# Patient Record
Sex: Female | Born: 1970 | Race: White | Hispanic: No | Marital: Married | Smoking: Former smoker
Health system: Southern US, Community
[De-identification: ages and names within clinical notes are randomized; demographics above are authoritative.]

## PROBLEM LIST (undated history)

## (undated) DIAGNOSIS — E119 Type 2 diabetes mellitus without complications: Secondary | ICD-10-CM

## (undated) DIAGNOSIS — I639 Cerebral infarction, unspecified: Secondary | ICD-10-CM

## (undated) DIAGNOSIS — R55 Syncope and collapse: Secondary | ICD-10-CM

## (undated) DIAGNOSIS — S93401A Sprain of unspecified ligament of right ankle, initial encounter: Secondary | ICD-10-CM

## (undated) DIAGNOSIS — E785 Hyperlipidemia, unspecified: Secondary | ICD-10-CM

## (undated) DIAGNOSIS — G4710 Hypersomnia, unspecified: Secondary | ICD-10-CM

## (undated) HISTORY — DX: Sprain of unspecified ligament of right ankle, initial encounter: S93.401A

## (undated) HISTORY — DX: Hypersomnia, unspecified: G47.10

## (undated) HISTORY — DX: Syncope and collapse: R55

## (undated) NOTE — *Deleted (*Deleted)
   Objective Swallowing Evaluation: MBS-Modified Barium Swallow Study  Patient Details  Name: Deborah [name] MRN: [id number] Date of Birth: 1971-01-08  Today's Date: 04/01/2020 Time:  -     Past Medical History:  Past Medical History:  Diagnosis Date  . DM (diabetes mellitus), type 2 (HCC)   . Hyperlipidemia   . Hypersomnia   . Sprain of right ankle, initial encounter   . Stroke (HCC)   . Syncope    Past Surgical History:  Past Surgical History:  Procedure Laterality Date  . IR CT HEAD LTD  03/08/2020  . IR PERCUTANEOUS ART THROMBECTOMY/INFUSION INTRACRANIAL INC DIAG ANGIO  03/08/2020  . RADIOLOGY WITH ANESTHESIA N/A 03/08/2020   Procedure: IR WITH ANESTHESIA;  Surgeon: Julieanne Cotton, MD;  Location: [facility] OR;  Service: Radiology;  Laterality: N/A;   HPI:  49 y/o female with history of IDDM, HLD, tobacco use disorder presented to [facility] for facial droop, right sided weakness and global aphasia. Found to have LVO of left M1 segment and was transferred to [facility]. Pt underwent left common carotid arteriogram followed by revascularization of L MCA, however it reoccluded due to sever distal M1 stenosis.Pt underwent trach on 9/26.     Recommendation/Prognosis  Clinical Impression:   SLP Visit Diagnosis: Dysphagia, oropharyngeal phase (R13.12) Impact on safety and function: Mild aspiration risk   Swallow Evaluation Recommendations:       Prognosis:  Prognosis for Safe Diet Advancement: Good   Individuals Consulted: Consulted and Agree with Results and Recommendations: Family member/caregiver Family Member Consulted: patient's boyfriend/fiance Report Sent to : Referring physician      SLP Assessment/Plan  Plan:      Short Term Goals: {[id number]}    General: Date of Onset: 03/31/20 Type of Study: MBS-Modified Barium Swallow Study Previous Swallow Assessment: BSE Diet Prior to this Study: NPO Temperature Spikes Noted: No Trach Size and Type:  #4;Uncuffed;With PMSV in place History of Recent Intubation: Yes Length of Intubations (days): 5 days Date extubated:  (trach 9/26) Oral Cavity - Dentition: Edentulous Self-Feeding Abilities: Total assist Baseline Vocal Quality: Normal Volitional Cough: Cognitively unable to elicit Volitional Swallow: Unable to elicit Anatomy: Within functional limits Pharyngeal Secretions: Not observed secondary MBS   Reason for Referral:        Oral Phase:     Pharyngeal Phase:      Cervical Esophageal Phase      GN          Pablo Lawrence 04/01/2020, 5:56 PM

## (undated) NOTE — *Deleted (*Deleted)
Nutrition Follow-up  DOCUMENTATION CODES:   Obesity unspecified  INTERVENTION:  ***   NUTRITION DIAGNOSIS:   Inadequate oral intake related to inability to eat as evidenced by NPO status.  ***  GOAL:   Provide needs based on ASPEN/SCCM guidelines  ***  MONITOR:   TF tolerance, Labs  REASON FOR ASSESSMENT:   Consult, Ventilator Enteral/tube feeding initiation and management  ASSESSMENT:   Pt with PMH of IDDM, HLD, tobacco abuse admitted to OOH with stroke symptoms. Pt admitted to [facility] with L MCA stroke s/p unsuccessful revascularization.  ***  Labs: Meds:    Diet Order:   Diet Order            Diet NPO time specified  Diet effective midnight                 EDUCATION NEEDS:   No education needs have been identified at this time  Skin:  Skin Assessment: Reviewed RN Assessment  Last BM:  unknown  Height:   Ht Readings from Last 1 Encounters:  03/08/20 5' (1.524 m)    Weight:   Wt Readings from Last 1 Encounters:  03/17/20 92.1 kg    Ideal Body Weight:  45.4 kg  BMI:  Body mass index is 39.65 kg/m.  Estimated Nutritional Needs:   Kcal:  1200  Protein:  >90 grams  Fluid:  > 1.5 L/day    Romelle Starcher MS, RDN, LDN, CNSC Registered Dietitian III Clinical Nutrition RD Pager and On-Call Pager Number Located in [place]

---

## 2005-06-18 DIAGNOSIS — R112 Nausea with vomiting, unspecified: Secondary | ICD-10-CM | POA: Insufficient documentation

## 2005-06-18 DIAGNOSIS — Z9889 Other specified postprocedural states: Secondary | ICD-10-CM | POA: Insufficient documentation

## 2016-07-19 DIAGNOSIS — N261 Atrophy of kidney (terminal): Secondary | ICD-10-CM | POA: Insufficient documentation

## 2016-07-19 DIAGNOSIS — R109 Unspecified abdominal pain: Secondary | ICD-10-CM | POA: Insufficient documentation

## 2016-09-03 DIAGNOSIS — N119 Chronic tubulo-interstitial nephritis, unspecified: Secondary | ICD-10-CM | POA: Insufficient documentation

## 2019-10-28 ENCOUNTER — Other Ambulatory Visit: Payer: Self-pay

## 2019-10-28 DIAGNOSIS — R0602 Shortness of breath: Secondary | ICD-10-CM | POA: Insufficient documentation

## 2019-10-28 DIAGNOSIS — F419 Anxiety disorder, unspecified: Secondary | ICD-10-CM | POA: Insufficient documentation

## 2019-10-28 DIAGNOSIS — J309 Allergic rhinitis, unspecified: Secondary | ICD-10-CM | POA: Insufficient documentation

## 2019-10-28 DIAGNOSIS — E119 Type 2 diabetes mellitus without complications: Secondary | ICD-10-CM | POA: Insufficient documentation

## 2019-10-28 DIAGNOSIS — G43909 Migraine, unspecified, not intractable, without status migrainosus: Secondary | ICD-10-CM | POA: Insufficient documentation

## 2019-10-29 ENCOUNTER — Encounter: Payer: Self-pay | Admitting: Cardiology

## 2019-10-29 ENCOUNTER — Other Ambulatory Visit: Payer: Self-pay

## 2019-10-29 ENCOUNTER — Ambulatory Visit (INDEPENDENT_AMBULATORY_CARE_PROVIDER_SITE_OTHER): Payer: Medicaid Other | Admitting: Cardiology

## 2019-10-29 VITALS — BP 126/70 | HR 95 | Temp 98.4°F | Ht 60.0 in | Wt 200.0 lb

## 2019-10-29 DIAGNOSIS — R0602 Shortness of breath: Secondary | ICD-10-CM | POA: Diagnosis not present

## 2019-10-29 DIAGNOSIS — R0789 Other chest pain: Secondary | ICD-10-CM | POA: Insufficient documentation

## 2019-10-29 DIAGNOSIS — R55 Syncope and collapse: Secondary | ICD-10-CM | POA: Diagnosis not present

## 2019-10-29 DIAGNOSIS — F17200 Nicotine dependence, unspecified, uncomplicated: Secondary | ICD-10-CM

## 2019-10-29 MED ORDER — CLOPIDOGREL BISULFATE 75 MG PO TABS
75.0000 mg | ORAL_TABLET | Freq: Every day | ORAL | 2 refills | Status: DC
Start: 2019-10-29 — End: 2020-03-30

## 2019-10-29 NOTE — Patient Instructions (Addendum)
Medication Instructions:  Your physician has recommended you make the following change in your medication:  START: Plavix 75 mg daily   *If you need a refill on your cardiac medications before your next appointment, please call your pharmacy*   Lab Work: None.  If you have labs (blood work) drawn today and your tests are completely normal, you will receive your results only by: Marland Kitchen MyChart Message (if you have MyChart) OR . A paper copy in the mail If you have any lab test that is abnormal or we need to change your treatment, we will call you to review the results.   Testing/Procedures: Your physician has requested that you have an echocardiogram. Echocardiography is a painless test that uses sound waves to create images of your heart. It provides your doctor with information about the size and shape of your heart and how well your heart's chambers and valves are working. This procedure takes approximately one hour. There are no restrictions for this procedure.    [facility] [address] [place], [place] [address] Phone:  [phone]    Please arrive 15 minutes prior to your appointment time for registration and insurance purposes.  The test will take approximately 3 to 4 hours to complete; you may bring reading material.  If someone comes with you to your appointment, they will need to remain in the main lobby due to limited space in the testing area. **If you are pregnant or breastfeeding, please notify the nuclear lab prior to your appointment**  How to prepare for your Myocardial Perfusion Test: . Do not eat or drink 3 hours prior to your test, except you may have water. . Do not consume products containing caffeine (regular or decaffeinated) 12 hours prior to your test. (ex: coffee, chocolate, sodas, tea). . Do bring a list of your current medications with you.  If not listed below, you may take your medications as normal. .  HOLD diabetic  medication/insulin the morning of the test: glimepiride, and victoza, Take half of long acting insulin the night before test: lantus 10.5 units . Do wear comfortable clothes (no dresses or overalls) and walking shoes, tennis shoes preferred (No heels or open toe shoes are allowed). . Do NOT wear cologne, perfume, aftershave, or lotions (deodorant is allowed). . If these instructions are not followed, your test will have to be rescheduled.  Please report to [address] for your test.  If you have questions or concerns about your appointment, you can call the [facility] at [phone].  If you cannot keep your appointment, please provide 24 hours notification to the Nuclear Lab, to avoid a possible $50 charge to your account.   A zio monitor was ordered today. It will remain on for 14 days. You will then return monitor and event diary in provided box. It takes 1-2 weeks for report to be downloaded and returned to [place]. We will call you with the results. If monitor falls off or has orange flashing light, please call Zio for further instructions.    Follow-Up: At [facility], you and your health needs are our priority.  As part of our continuing mission to provide you with exceptional heart care, we have created designated Provider Care Teams.  These Care Teams include your primary Cardiologist (physician) and Advanced Practice Providers (APPs -  Physician Assistants and Nurse Practitioners) who all work together to provide you with the care you need, when you need  it.  We recommend signing up for the patient portal called "MyChart".  Sign up information is provided on this After Visit Summary.  MyChart is used to connect with patients for Virtual Visits (Telemedicine).  Patients are able to view lab/test results, encounter notes, upcoming appointments, etc.  Non-urgent messages can be sent to your provider as well.   To learn more about what you can do with  MyChart, go to [email or web address].    Your next appointment:   6 week(s)  The format for your next appointment:   In Person  Provider:   Gypsy Balsam, MD   Other Instructions   Echocardiogram An echocardiogram is a procedure that uses painless sound waves (ultrasound) to produce an image of the heart. Images from an echocardiogram can provide important information about:  Signs of coronary artery disease (CAD).  Aneurysm detection. An aneurysm is a weak or damaged part of an artery wall that bulges out from the normal force of blood pumping through the body.  Heart size and shape. Changes in the size or shape of the heart can be associated with certain conditions, including heart failure, aneurysm, and CAD.  Heart muscle function.  Heart valve function.  Signs of a past heart attack.  Fluid buildup around the heart.  Thickening of the heart muscle.  A tumor or infectious growth around the heart valves. Tell a health care provider about:  Any allergies you have.  All medicines you are taking, including vitamins, herbs, eye drops, creams, and over-the-counter medicines.  Any blood disorders you have.  Any surgeries you have had.  Any medical conditions you have.  Whether you are pregnant or may be pregnant. What are the risks? Generally, this is a safe procedure. However, problems may occur, including:  Allergic reaction to dye (contrast) that may be used during the procedure. What happens before the procedure? No specific preparation is needed. You may eat and drink normally. What happens during the procedure?   An IV tube may be inserted into one of your veins.  You may receive contrast through this tube. A contrast is an injection that improves the quality of the pictures from your heart.  A gel will be applied to your chest.  A wand-like tool (transducer) will be moved over your chest. The gel will help to transmit the sound waves from the  transducer.  The sound waves will harmlessly bounce off of your heart to allow the heart images to be captured in real-time motion. The images will be recorded on a computer. The procedure may vary among health care providers and hospitals. What happens after the procedure?  You may return to your normal, everyday life, including diet, activities, and medicines, unless your health care provider tells you not to do that. Summary  An echocardiogram is a procedure that uses painless sound waves (ultrasound) to produce an image of the heart.  Images from an echocardiogram can provide important information about the size and shape of your heart, heart muscle function, heart valve function, and fluid buildup around your heart.  You do not need to do anything to prepare before this procedure. You may eat and drink normally.  After the echocardiogram is completed, you may return to your normal, everyday life, unless your health care provider tells you not to do that. This information is not intended to replace advice given to you by your health care provider. Make sure you discuss any questions you have with your health care provider. Document Revised:  09/25/2018 Document Reviewed: 07/07/2016 Elsevier Patient Education  2020 [place].   Cardiac Nuclear Scan A cardiac nuclear scan is a test that measures blood flow to the heart when a person is resting and when he or she is exercising. The test looks for problems such as:  Not enough blood reaching a portion of the heart.  The heart muscle not working normally. You may need this test if:  You have heart disease.  You have had abnormal lab results.  You have had heart surgery or a balloon procedure to open up blocked arteries (angioplasty).  You have chest pain.  You have shortness of breath. In this test, a radioactive dye (tracer) is injected into your bloodstream. After the tracer has traveled to your heart, an imaging device is  used to measure how much of the tracer is absorbed by or distributed to various areas of your heart. This procedure is usually done at a hospital and takes 2-4 hours. Tell a health care provider about:  Any allergies you have.  All medicines you are taking, including vitamins, herbs, eye drops, creams, and over-the-counter medicines.  Any problems you or family members have had with anesthetic medicines.  Any blood disorders you have.  Any surgeries you have had.  Any medical conditions you have.  Whether you are pregnant or may be pregnant. What are the risks? Generally, this is a safe procedure. However, problems may occur, including:  Serious chest pain and heart attack. This is only a risk if the stress portion of the test is done.  Rapid heartbeat.  Sensation of warmth in your chest. This usually passes quickly.  Allergic reaction to the tracer. What happens before the procedure?  Ask your health care provider about changing or stopping your regular medicines. This is especially important if you are taking diabetes medicines or blood thinners.  Follow instructions from your health care provider about eating or drinking restrictions.  Remove your jewelry on the day of the procedure. What happens during the procedure?  An IV will be inserted into one of your veins.  Your health care provider will inject a small amount of radioactive tracer through the IV.  You will wait for 20-40 minutes while the tracer travels through your bloodstream.  Your heart activity will be monitored with an electrocardiogram (ECG).  You will lie down on an exam table.  Images of your heart will be taken for about 15-20 minutes.  You may also have a stress test. For this test, one of the following may be done: ? You will exercise on a treadmill or stationary bike. While you exercise, your heart's activity will be monitored with an ECG, and your blood pressure will be checked. ? You will be  given medicines that will increase blood flow to parts of your heart. This is done if you are unable to exercise.  When blood flow to your heart has peaked, a tracer will again be injected through the IV.  After 20-40 minutes, you will get back on the exam table and have more images taken of your heart.  Depending on the type of tracer used, scans may need to be repeated 3-4 hours later.  Your IV line will be removed when the procedure is over. The procedure may vary among health care providers and hospitals. What happens after the procedure?  Unless your health care provider tells you otherwise, you may return to your normal schedule, including diet, activities, and medicines.  Unless your health  care provider tells you otherwise, you may increase your fluid intake. This will help to flush the contrast dye from your body. Drink enough fluid to keep your urine pale yellow.  Ask your health care provider, or the department that is doing the test: ? When will my results be ready? ? How will I get my results? Summary  A cardiac nuclear scan measures the blood flow to the heart when a person is resting and when he or she is exercising.  Tell your health care provider if you are pregnant.  Before the procedure, ask your health care provider about changing or stopping your regular medicines. This is especially important if you are taking diabetes medicines or blood thinners.  After the procedure, unless your health care provider tells you otherwise, increase your fluid intake. This will help flush the contrast dye from your body.  After the procedure, unless your health care provider tells you otherwise, you may return to your normal schedule, including diet, activities, and medicines. This information is not intended to replace advice given to you by your health care provider. Make sure you discuss any questions you have with your health care provider. Document Revised: 11/18/2017 Document  Reviewed: 11/18/2017 Elsevier Patient Education  2020 Elsevier Inc.  Clopidogrel tablets What is this medicine? CLOPIDOGREL (kloh PID oh grel) helps to prevent blood clots. This medicine is used to prevent heart attack, stroke, or other vascular events in people who are at high risk. This medicine may be used for other purposes; ask your health care provider or pharmacist if you have questions. COMMON BRAND NAME(S): Plavix What should I tell my health care provider before I take this medicine? They need to know if you have any of the following conditions:  bleeding disorders  bleeding in the brain  having surgery  history of stomach bleeding  an unusual or allergic reaction to clopidogrel, other medicines, foods, dyes, or preservatives  pregnant or trying to get pregnant  breast-feeding How should I use this medicine? Take this medicine by mouth with a glass of water. Follow the directions on the prescription label. You may take this medicine with or without food. If it upsets your stomach, take it with food. Take your medicine at regular intervals. Do not take it more often than directed. Do not stop taking except on your doctor's advice. A special MedGuide will be given to you by the pharmacist with each prescription and refill. Be sure to read this information carefully each time. Talk to your pediatrician regarding the use of this medicine in children. Special care may be needed. Overdosage: If you think you have taken too much of this medicine contact a poison control center or emergency room at once. NOTE: This medicine is only for you. Do not share this medicine with others. What if I miss a dose? If you miss a dose, take it as soon as you can. If it is almost time for your next dose, take only that dose. Do not take double or extra doses. What may interact with this medicine? Do not take this medicine with the following medications:  dasabuvir; ombitasvir; paritaprevir;  ritonavir  defibrotide  selexipag This medicine may also interact with the following medications:  certain medicines that treat or prevent blood clots like warfarin  narcotic medicines for pain  NSAIDs, medicines for pain and inflammation, like ibuprofen or naproxen  repaglinide  SNRIs, medicines for depression, like desvenlafaxine, duloxetine, levomilnacipran, venlafaxine  SSRIs, medicines for depression, like citalopram,  escitalopram, fluoxetine, fluvoxamine, paroxetine, sertraline  stomach acid blockers like cimetidine, esomeprazole, omeprazole This list may not describe all possible interactions. Give your health care provider a list of all the medicines, herbs, non-prescription drugs, or dietary supplements you use. Also tell them if you smoke, drink alcohol, or use illegal drugs. Some items may interact with your medicine. What should I watch for while using this medicine? Visit your doctor or health care professional for regular check-ups. Do not stop taking your medicine unless your doctor tells you to. Notify your doctor or health care professional and seek emergency treatment if you develop breathing problems; changes in vision; chest pain; severe, sudden headache; pain, swelling, warmth in the leg; trouble speaking; sudden numbness or weakness of the face, arm or leg. These can be signs that your condition has gotten worse. If you are going to have surgery or dental work, tell your doctor or health care professional that you are taking this medicine. Certain genetic factors may reduce the effect of this medicine. Your doctor may use genetic tests to determine treatment. Only take aspirin if you are instructed to. Low doses of aspirin are used with this medicine to treat some conditions. Taking aspirin with this medicine can increase your risk of bleeding so you must be careful. Talk to your doctor or pharmacist if you have questions. What side effects may I notice from receiving  this medicine? Side effects that you should report to your doctor or health care professional as soon as possible:  allergic reactions like skin rash, itching or hives, swelling of the face, lips, or tongue  signs and symptoms of bleeding such as bloody or black, tarry stools; red or dark-brown urine; spitting up blood or brown material that looks like coffee grounds; red spots on the skin; unusual bruising or bleeding from the eye, gums, or nose  signs and symptoms of a blood clot such as breathing problems; changes in vision; chest pain; severe, sudden headache; pain, swelling, warmth in the leg; trouble speaking; sudden numbness or weakness of the face, arm or leg  signs and symptoms of low blood sugar such as feeling anxious; confusion; dizziness; increased hunger; unusually weak or tired; increased sweating; shakiness; cold, clammy skin; irritable; headache; blurred vision; fast heartbeat; loss of consciousness Side effects that usually do not require medical attention (report to your doctor or health care professional if they continue or are bothersome):  constipation  diarrhea  headache  upset stomach This list may not describe all possible side effects. Call your doctor for medical advice about side effects. You may report side effects to FDA at 1-800-FDA-1088. Where should I keep my medicine? Keep out of the reach of children. Store at room temperature of 59 to 86 degrees F (15 to 30 degrees C). Throw away any unused medicine after the expiration date. NOTE: This sheet is a summary. It may not cover all possible information. If you have questions about this medicine, talk to your doctor, pharmacist, or health care provider.  2020 Elsevier/Gold Standard (2017-11-04 15:03:38)

## 2019-10-29 NOTE — Progress Notes (Signed)
Cardiology Consultation:    Date:  10/29/2019   ID:  Erle Crocker, DOB [birth year], MRN [id number]  PCP:  Ronita Hipps, MD  Cardiologist:  Jenne Campus, MD   Referring MD: Ronita Hipps, MD   Chief Complaint  Patient presents with  . Loss of Consciousness    last episode 10/22/19    History of Present Illness:    Deborah [name] is a 49 y.o. female who is being seen today for the evaluation of an passing out at the request of Ronita Hipps, MD.  Past medical history significant for longstanding poorly controlled type 2 diabetes, dyslipidemia, smoking which is ongoing.  She comes today to my office to be establish as a patient.  She experienced multiple episode of syncope that been going on since January she said total number of time that she passed out is about 15.  Those episodes always happen when she is standing up it never happened when she sits never happened when she lays down.  When she stands up she started feeling woozy swimmy headed then she typically feels her heart speeding up and then she goes down when she wakes up she is perfectly with it overall after that this entire day is kind of wasted.  She does not have energy to do anything that day.  She never had seizure activity, she never wet  herself, she never bite her tongue.  Past Medical History:  Diagnosis Date  . Hypersomnia   . Sprain of right ankle, initial encounter   . Syncope       Current Medications: Current Meds  Medication Sig  . acetaminophen (TYLENOL) 325 MG tablet Take 325 mg by mouth every 6 (six) hours as needed.  . ALPRAZolam (XANAX) 0.5 MG tablet Take 0.5 mg by mouth 3 (three) times daily as needed. Take 1/2 to 1 tablet by mouth TID PRN  . atorvastatin (LIPITOR) 20 MG tablet Take 20 mg by mouth daily.  . Butalbital-APAP-Caffeine 50-325-40 MG capsule TK 1 C PO BID PRN FOR MIGRAINES  . Erenumab-aooe 70 MG/ML SOAJ Inject into the skin. Once Monthly  . glimepiride (AMARYL) 2 MG tablet  Take 2 mg by mouth daily with breakfast.  . glucose blood (ACCU-CHEK AVIVA PLUS) test strip USE 1 STRIP UTD  . insulin glargine (LANTUS SOLOSTAR) 100 UNIT/ML Solostar Pen INJ 21 UNITS [place] D  . Insulin Pen Needle (NOVOFINE) 32G X 6 MM MISC USE BID  . Lancets (ACCU-CHEK MULTICLIX) lancets U TID  . liraglutide (VICTOZA) 18 MG/3ML SOPN INJ 1.8 MG SQ QD FOR DIABETES  . liraglutide (VICTOZA) 18 MG/3ML SOPN Inject into the skin. 1.8 daily  . traMADol (ULTRAM) 50 MG tablet TK 1 TO 2 TS PO TID  . [DISCONTINUED] Erenumab-aooe 70 MG/ML SOAJ Inject into the skin. Once monthly     Allergies:   Aspirin   Social History   Socioeconomic History  . Marital status: Unknown    Spouse name: Not on file  . Number of children: Not on file  . Years of education: Not on file  . Highest education level: Not on file  Occupational History  . Not on file  Tobacco Use  . Smoking status: Current Every Day Smoker    Packs/day: 0.50    Types: Cigarettes  . Smokeless tobacco: Never Used  Substance and Sexual Activity  . Alcohol use: Not Currently  . Drug use: Not Currently  . Sexual activity: Not on file  Other Topics Concern  .  Not on file  Social History Narrative  . Not on file   Social Determinants of Health   Financial Resource Strain:   . Difficulty of Paying Living Expenses:   Food Insecurity:   . Worried About Programme researcher, broadcasting/film/video in the Last Year:   . Barista in the Last Year:   Transportation Needs:   . Freight forwarder (Medical):   Marland Kitchen Lack of Transportation (Non-Medical):   Physical Activity:   . Days of Exercise per Week:   . Minutes of Exercise per Session:   Stress:   . Feeling of Stress :   Social Connections:   . Frequency of Communication with Friends and Family:   . Frequency of Social Gatherings with Friends and Family:   . Attends Religious Services:   . Active Member of Clubs or Organizations:   . Attends Banker Meetings:   Marland Kitchen Marital Status:        Family History: The patient's family history includes Heart Problems in her father; Heart defect in her maternal grandmother and mother; Stroke in her paternal grandfather. ROS:   Please see the history of present illness.    All 14 point review of systems negative except as described per history of present illness.  EKGs/Labs/Other Studies Reviewed:    The following studies were reviewed today:   EKG:  EKG is  ordered today.  The ekg ordered today demonstrates normal sinus rhythm, normal P interval, normal QS complex duration morphology, no ST segment changes.  Recent Labs: No results found for requested labs within last 8760 hours.  Recent Lipid Panel No results found for: CHOL, TRIG, HDL, CHOLHDL, VLDL, LDLCALC, LDLDIRECT  Physical Exam:    VS:  BP 126/70   Pulse 95   Temp 98.4 F (36.9 C)   Ht 5' (1.524 m)   Wt 200 lb (90.7 kg)   SpO2 98%   BMI 39.06 kg/m     Wt Readings from Last 3 Encounters:  10/29/19 200 lb (90.7 kg)     GEN:  Well nourished, well developed in no acute distress HEENT: Normal NECK: No JVD; No carotid bruits LYMPHATICS: No lymphadenopathy CARDIAC: RRR, no murmurs, no rubs, no gallops RESPIRATORY:  Clear to auscultation without rales, wheezing or rhonchi  ABDOMEN: Soft, non-tender, non-distended MUSCULOSKELETAL:  No edema; No deformity  SKIN: Warm and dry NEUROLOGIC:  Alert and oriented x 3 PSYCHIATRIC:  Normal affect   ASSESSMENT:    1. Shortness of breath   2. Syncope and collapse   3. Atypical chest pain   4. Smoking    PLAN:    In order of problems listed above:  1. Syncope and collapse multiple episodes total of 15.  Look like they always start happening when she is standing up.  She only had one episode when she started feeling woozy when she was sitting up and she was able to lay down and abort the episode.  What make me worry is the fact that she described to have some pounding in the chest just before she passed out.  I  suspect this is truly a vasovagal event with some acceleration of the heart rate just before passing out.  However I cannot completely rule out arrhythmia, therefore, we will schedule her to have monitor for 2 weeks to see if she gets any significant arrhythmia.  As a part of evaluation and also in view of the fact that she does have exertional shortness of breath  I will schedule her to have echocardiogram to assess left ventricle ejection fraction.  Because of multiple risk factors for having coronary artery disease as well as: Ongoing smoking and poorly controlled diabetes as well as cholesterol I will ask you to have stress test done.  He will do [place].  I will ask you to start taking Plavix 75 mg daily.  She is allergic to aspirin. 2. Dyslipidemia I do have her fasting lipid profile which show LDL of 180.  I will ask you to double the dose of Lipitor. 3. Diabetes finally she started compliant with diet and she is on appropriate medical therapy.  That being followed excellently by internal medicine team.   Medication Adjustments/Labs and Tests Ordered: Current medicines are reviewed at length with the patient today.  Concerns regarding medicines are outlined above.  No orders of the defined types were placed in this encounter.  No orders of the defined types were placed in this encounter.   Signed, Georgeanna Lea, MD, [facility]. 10/29/2019 3:27 PM    [facility]

## 2019-11-03 ENCOUNTER — Telehealth (HOSPITAL_COMMUNITY): Payer: Self-pay | Admitting: *Deleted

## 2019-11-03 NOTE — Telephone Encounter (Signed)
Patient given detailed instructions per Myocardial Perfusion Study Information Sheet for the test on 11/05/19. Patient notified to arrive 15 minutes early and that it is imperative to arrive on time for appointment to keep from having the test rescheduled.  If you need to cancel or reschedule your appointment, please call the office within 24 hours of your appointment. . Patient verbalized understanding.Deborah [name] [name]    

## 2019-11-05 ENCOUNTER — Ambulatory Visit (INDEPENDENT_AMBULATORY_CARE_PROVIDER_SITE_OTHER): Payer: Medicaid Other

## 2019-11-05 ENCOUNTER — Other Ambulatory Visit: Payer: Self-pay

## 2019-11-05 VITALS — Ht 60.0 in | Wt 200.0 lb

## 2019-11-05 DIAGNOSIS — R55 Syncope and collapse: Secondary | ICD-10-CM | POA: Diagnosis not present

## 2019-11-05 DIAGNOSIS — R0602 Shortness of breath: Secondary | ICD-10-CM

## 2019-11-05 DIAGNOSIS — R0789 Other chest pain: Secondary | ICD-10-CM

## 2019-11-05 LAB — MYOCARDIAL PERFUSION IMAGING
LV dias vol: 52 mL (ref 46–106)
LV sys vol: 19 mL
Peak HR: 116 {beats}/min
Rest HR: 80 {beats}/min
SDS: 4
SRS: 0
SSS: 4
TID: 0.96

## 2019-11-05 MED ORDER — TECHNETIUM TC 99M TETROFOSMIN IV KIT
31.6000 | PACK | Freq: Once | INTRAVENOUS | Status: AC | PRN
  Administered 2019-11-05: 31.6 via INTRAVENOUS

## 2019-11-05 MED ORDER — TECHNETIUM TC 99M TETROFOSMIN IV KIT
10.2000 | PACK | Freq: Once | INTRAVENOUS | Status: AC | PRN
  Administered 2019-11-05: 10.2 via INTRAVENOUS

## 2019-11-05 MED ORDER — REGADENOSON 0.4 MG/5ML IV SOLN
0.4000 mg | Freq: Once | INTRAVENOUS | Status: AC
  Administered 2019-11-05: 0.4 mg via INTRAVENOUS

## 2019-11-06 ENCOUNTER — Telehealth: Payer: Self-pay

## 2019-11-06 NOTE — Telephone Encounter (Signed)
-----   Message from Georgeanna Lea, MD sent at 11/06/2019  8:35 AM EDT ----- Stress test was normal

## 2019-11-06 NOTE — Telephone Encounter (Signed)
Left message on patients voicemail to please return our call.   

## 2019-11-13 ENCOUNTER — Other Ambulatory Visit: Payer: Self-pay

## 2019-11-13 ENCOUNTER — Ambulatory Visit (INDEPENDENT_AMBULATORY_CARE_PROVIDER_SITE_OTHER): Payer: Medicaid Other

## 2019-11-13 DIAGNOSIS — R0789 Other chest pain: Secondary | ICD-10-CM | POA: Diagnosis not present

## 2019-11-13 DIAGNOSIS — R55 Syncope and collapse: Secondary | ICD-10-CM | POA: Diagnosis not present

## 2019-11-13 DIAGNOSIS — R0602 Shortness of breath: Secondary | ICD-10-CM | POA: Diagnosis not present

## 2019-11-13 NOTE — Progress Notes (Signed)
Complete echocardiogram performed.  Jimmy Demar Shad RDCS, RVT  

## 2019-11-18 ENCOUNTER — Telehealth: Payer: Self-pay

## 2019-11-18 NOTE — Telephone Encounter (Signed)
-----   Message from Georgeanna Lea, MD sent at 11/17/2019  6:11 PM EDT ----- Echocardiogram showed preserved left ventricle ejection fraction, overall looks good

## 2019-11-18 NOTE — Telephone Encounter (Signed)
Left message on patients voicemail to please return our call.   

## 2019-12-10 ENCOUNTER — Encounter: Payer: Self-pay | Admitting: Cardiology

## 2019-12-10 ENCOUNTER — Ambulatory Visit (INDEPENDENT_AMBULATORY_CARE_PROVIDER_SITE_OTHER): Payer: Medicaid Other | Admitting: Cardiology

## 2019-12-10 ENCOUNTER — Other Ambulatory Visit: Payer: Self-pay

## 2019-12-10 VITALS — BP 126/70 | HR 89 | Ht 60.0 in | Wt 203.8 lb

## 2019-12-10 DIAGNOSIS — R0789 Other chest pain: Secondary | ICD-10-CM

## 2019-12-10 DIAGNOSIS — R55 Syncope and collapse: Secondary | ICD-10-CM

## 2019-12-10 DIAGNOSIS — F17200 Nicotine dependence, unspecified, uncomplicated: Secondary | ICD-10-CM | POA: Diagnosis not present

## 2019-12-10 DIAGNOSIS — R0602 Shortness of breath: Secondary | ICD-10-CM

## 2019-12-10 DIAGNOSIS — E785 Hyperlipidemia, unspecified: Secondary | ICD-10-CM

## 2019-12-10 NOTE — Progress Notes (Signed)
Cardiology Office Note:    Date:  12/10/2019   ID:  Deborah [name], DOB 1970-10-28, MRN [id number]  PCP:  [name], MD  Cardiologist:  Jenne Campus, MD    Referring MD: Ronita Hipps, MD   No chief complaint on file. Doing much better  History of Present Illness:    Deborah [name] is a 49 y.o. female with past medical history significant for diabetes mellitus which was poorly controlled, dyslipidemia, multiple episode of syncope she described total of 15 episodes.  From the way that she describes episode look like to some vasovagal but because of her significant risk factors for coronary artery disease she did have echocardiogram showing preserved left ventricle ejection fraction, pulmonary artery pressure was normal, she also had stress test done which showed no evidence of ischemia she is supposed to wear Zio patch for some reason did not happen.  Last time when I seen her I explained to her if she feels that she is going passed out she had to lay down and sit down and she is been doing it and feeling much better right now.  Also encouraged her to drink plenty of fluids that seems to be helping as well.  Past Medical History:  Diagnosis Date  . Hypersomnia   . Sprain of right ankle, initial encounter   . Syncope       Current Medications: Current Meds  Medication Sig  . acetaminophen (TYLENOL) 325 MG tablet Take 325 mg by mouth every 6 (six) hours as needed.  Marland Kitchen albuterol (VENTOLIN HFA) 108 (90 Base) MCG/ACT inhaler Inhale 2 puffs into the lungs every 6 (six) hours as needed.  . ALPRAZolam (XANAX) 0.5 MG tablet Take 0.5 mg by mouth 3 (three) times daily as needed. Take 1/2 to 1 tablet by mouth TID PRN  . atorvastatin (LIPITOR) 20 MG tablet Take 20 mg by mouth daily.  . Butalbital-APAP-Caffeine 50-325-40 MG capsule TK 1 C PO BID PRN FOR MIGRAINES  . clopidogrel (PLAVIX) 75 MG tablet Take 1 tablet (75 mg total) by mouth daily.  . cyclobenzaprine (FLEXERIL) 5 MG tablet  TK 1 T PO TID PRF BACK PAIN  . Erenumab-aooe 70 MG/ML SOAJ Inject into the skin. Once Monthly  . gabapentin (NEURONTIN) 300 MG capsule TK ONE C PO BID P  . glimepiride (AMARYL) 2 MG tablet Take 2 mg by mouth daily with breakfast.  . glucose blood (ACCU-CHEK AVIVA PLUS) test strip USE 1 STRIP UTD  . insulin glargine (LANTUS SOLOSTAR) 100 UNIT/ML Solostar Pen INJ 21 UNITS [place] D  . Insulin Pen Needle (NOVOFINE) 32G X 6 MM MISC USE BID  . Lancets (ACCU-CHEK MULTICLIX) lancets U TID  . levofloxacin (LEVAQUIN) 500 MG tablet Take by mouth.  . liraglutide (VICTOZA) 18 MG/3ML SOPN INJ 1.8 MG SQ QD FOR DIABETES  . liraglutide (VICTOZA) 18 MG/3ML SOPN Inject into the skin. 1.8 daily  . loratadine (CLARITIN) 10 MG tablet Take by mouth.  . promethazine (PHENERGAN) 25 MG tablet TK 1 T PO TID PRN N OR DIZZINESS  . traMADol (ULTRAM) 50 MG tablet TK 1 TO 2 TS PO TID     Allergies:   Aspirin   Social History   Socioeconomic History  . Marital status: Unknown    Spouse name: Not on file  . Number of children: Not on file  . Years of education: Not on file  . Highest education level: Not on file  Occupational History  . Not on file  Tobacco  Use  . Smoking status: Current Every Day Smoker    Packs/day: 0.50    Types: Cigarettes  . Smokeless tobacco: Never Used  Vaping Use  . Vaping Use: Never used  Substance and Sexual Activity  . Alcohol use: Not Currently  . Drug use: Not Currently  . Sexual activity: Not on file  Other Topics Concern  . Not on file  Social History Narrative  . Not on file   Social Determinants of Health   Financial Resource Strain:   . Difficulty of Paying Living Expenses:   Food Insecurity:   . Worried About Programme researcher, broadcasting/film/video in the Last Year:   . Barista in the Last Year:   Transportation Needs:   . Freight forwarder (Medical):   Marland Kitchen Lack of Transportation (Non-Medical):   Physical Activity:   . Days of Exercise per Week:   . Minutes of Exercise  per Session:   Stress:   . Feeling of Stress :   Social Connections:   . Frequency of Communication with Friends and Family:   . Frequency of Social Gatherings with Friends and Family:   . Attends Religious Services:   . Active Member of Clubs or Organizations:   . Attends Banker Meetings:   Marland Kitchen Marital Status:      Family History: The patient's family history includes Heart Problems in her father; Heart defect in her maternal grandmother and mother; Stroke in her paternal grandfather. ROS:   Please see the history of present illness.    All 14 point review of systems negative except as described per history of present illness  EKGs/Labs/Other Studies Reviewed:      Recent Labs: No results found for requested labs within last 8760 hours.  Recent Lipid Panel No results found for: CHOL, TRIG, HDL, CHOLHDL, VLDL, LDLCALC, LDLDIRECT  Physical Exam:    VS:  BP 126/70 (BP Location: Right Arm, Patient Position: Sitting, Cuff Size: Normal)   Pulse 89   Ht 5' (1.524 m)   Wt 203 lb 12.8 oz (92.4 kg)   SpO2 99%   BMI 39.80 kg/m     Wt Readings from Last 3 Encounters:  12/10/19 203 lb 12.8 oz (92.4 kg)  11/05/19 200 lb (90.7 kg)  10/29/19 200 lb (90.7 kg)     GEN:  Well nourished, well developed in no acute distress HEENT: Normal NECK: No JVD; No carotid bruits LYMPHATICS: No lymphadenopathy CARDIAC: RRR, no murmurs, no rubs, no gallops RESPIRATORY:  Clear to auscultation without rales, wheezing or rhonchi  ABDOMEN: Soft, non-tender, non-distended MUSCULOSKELETAL:  No edema; No deformity  SKIN: Warm and dry LOWER EXTREMITIES: no swelling NEUROLOGIC:  Alert and oriented x 3 PSYCHIATRIC:  Normal affect   ASSESSMENT:    1. Syncope and collapse   2. Shortness of breath   3. Atypical chest pain   4. Smoking   5. Dyslipidemia    PLAN:    In order of problems listed above:  1. Syncope and collapse none since I seen her last time however few times she  became somewhat dizzy.  So far work-up from cardiac standpoint review is negative her echocardiogram showed preserved ejection fraction stress test showed no evidence of ischemia she did not wear Zio patch will make sure that she will get the Zio patch for at least 1 week.  I still advised her to continue using precaution when she feels she is dizzy and sitting down.  Also we talked  about staying hydrated. 2. Shortness of breath again cardiac work-up negative.  I suspect it is related to smoking which she understands she need to quit. 3. Atypical chest pain: Stress test showed no evidence of ischemia. 4. Smoking, obviously big problem she understands she need to quit and we spent at least 5 minutes talking about it. 5. Dyslipidemia: I did review her K PN from March which showed LDL of 180.  I will repeat her fasting lipid profile today that cholesterol in March was done without any medications now she is taking 20 mg of Lipitor which should reduce her LDL however and probably not sufficiently.  I anticipate need to increase dose of Lipitor to which high intensity statin level.   Medication Adjustments/Labs and Tests Ordered: Current medicines are reviewed at length with the patient today.  Concerns regarding medicines are outlined above.  Orders Placed This Encounter  Procedures  . Lipid panel  . LONG TERM MONITOR (3-14 DAYS)   Medication changes: No orders of the defined types were placed in this encounter.   Signed, Georgeanna Lea, MD, [facility] 12/10/2019 2:57 PM    [facility]

## 2019-12-10 NOTE — Patient Instructions (Signed)
Medication Instructions:  Your physician recommends that you continue on your current medications as directed. Please refer to the Current Medication list given to you today.  *If you need a refill on your cardiac medications before your next appointment, please call your pharmacy*   Lab Work: Your physician recommends that you return for lab work today: lipid   If you have labs (blood work) drawn today and your tests are completely normal, you will receive your results only by: . MyChart Message (if you have MyChart) OR . A paper copy in the mail If you have any lab test that is abnormal or we need to change your treatment, we will call you to review the results.   Testing/Procedures: A zio monitor was ordered today. It will remain on for 7 days. You will then return monitor and event diary in provided box. It takes 1-2 weeks for report to be downloaded and returned to us. We will call you with the results. If monitor falls off or has orange flashing light, please call Zio for further instructions.      Follow-Up: At [facility], you and your health needs are our priority.  As part of our continuing mission to provide you with exceptional heart care, we have created designated Provider Care Teams.  These Care Teams include your primary Cardiologist (physician) and Advanced Practice Providers (APPs -  Physician Assistants and Nurse Practitioners) who all work together to provide you with the care you need, when you need it.  We recommend signing up for the patient portal called "MyChart".  Sign up information is provided on this After Visit Summary.  MyChart is used to connect with patients for Virtual Visits (Telemedicine).  Patients are able to view lab/test results, encounter notes, upcoming appointments, etc.  Non-urgent messages can be sent to your provider as well.   To learn more about what you can do with MyChart, go to [email or web address].    Your next appointment:   4  month(s)  The format for your next appointment:   In Person  Provider:   Robert Krasowski, MD   Other Instructions   

## 2019-12-11 LAB — LIPID PANEL
Chol/HDL Ratio: 4.2 ratio (ref 0.0–4.4)
Cholesterol, Total: 201 mg/dL — ABNORMAL HIGH (ref 100–199)
HDL: 48 mg/dL (ref >39)
LDL Chol Calc (NIH): 116 mg/dL — ABNORMAL HIGH (ref 0–99)
Triglycerides: 214 mg/dL — ABNORMAL HIGH (ref 0–149)
VLDL Cholesterol Cal: 37 mg/dL (ref 5–40)

## 2019-12-14 ENCOUNTER — Ambulatory Visit (INDEPENDENT_AMBULATORY_CARE_PROVIDER_SITE_OTHER): Payer: Medicaid Other

## 2019-12-14 DIAGNOSIS — R0602 Shortness of breath: Secondary | ICD-10-CM

## 2019-12-14 DIAGNOSIS — R55 Syncope and collapse: Secondary | ICD-10-CM

## 2020-03-08 ENCOUNTER — Encounter (HOSPITAL_COMMUNITY): Payer: Self-pay | Admitting: Neurology

## 2020-03-08 ENCOUNTER — Inpatient Hospital Stay (HOSPITAL_COMMUNITY)
Admission: EM | Admit: 2020-03-08 | Discharge: 2020-03-08 | DRG: 003 | Disposition: A | Discharge status: Rehabilitation Facility | Payer: Medicaid Other | Source: Other Acute Inpatient Hospital | Attending: Neurology | Admitting: Neurology

## 2020-03-08 ENCOUNTER — Inpatient Hospital Stay (HOSPITAL_COMMUNITY)
Admission: EM | Admit: 2020-03-08 | Discharge: 2020-03-30 | Disposition: A | Discharge status: Rehabilitation Facility | Payer: Medicaid Other | Source: Home / Self Care | Attending: Neurology | Admitting: Neurology

## 2020-03-08 ENCOUNTER — Emergency Department (HOSPITAL_COMMUNITY): Payer: Medicaid Other

## 2020-03-08 ENCOUNTER — Other Ambulatory Visit (HOSPITAL_COMMUNITY): Payer: Self-pay | Admitting: Neurology

## 2020-03-08 ENCOUNTER — Inpatient Hospital Stay (HOSPITAL_COMMUNITY): Payer: Medicaid Other

## 2020-03-08 ENCOUNTER — Other Ambulatory Visit: Payer: Self-pay

## 2020-03-08 ENCOUNTER — Inpatient Hospital Stay: Admit: 2020-03-08 | Payer: Medicaid Other | Admitting: Interventional Radiology

## 2020-03-08 ENCOUNTER — Emergency Department (HOSPITAL_COMMUNITY): Payer: Medicaid Other | Admitting: Anesthesiology

## 2020-03-08 ENCOUNTER — Encounter (HOSPITAL_COMMUNITY)
Admission: EM | Disposition: A | Discharge status: Rehabilitation Facility | Payer: Self-pay | Source: Home / Self Care | Attending: Neurology

## 2020-03-08 DIAGNOSIS — I609 Nontraumatic subarachnoid hemorrhage, unspecified: Secondary | ICD-10-CM | POA: Diagnosis not present

## 2020-03-08 DIAGNOSIS — E876 Hypokalemia: Secondary | ICD-10-CM | POA: Diagnosis not present

## 2020-03-08 DIAGNOSIS — R509 Fever, unspecified: Secondary | ICD-10-CM | POA: Diagnosis not present

## 2020-03-08 DIAGNOSIS — F17210 Nicotine dependence, cigarettes, uncomplicated: Secondary | ICD-10-CM | POA: Diagnosis present

## 2020-03-08 DIAGNOSIS — Z794 Long term (current) use of insulin: Secondary | ICD-10-CM

## 2020-03-08 DIAGNOSIS — I63311 Cerebral infarction due to thrombosis of right middle cerebral artery: Secondary | ICD-10-CM | POA: Diagnosis not present

## 2020-03-08 DIAGNOSIS — Z978 Presence of other specified devices: Secondary | ICD-10-CM

## 2020-03-08 DIAGNOSIS — J9621 Acute and chronic respiratory failure with hypoxia: Secondary | ICD-10-CM | POA: Diagnosis present

## 2020-03-08 DIAGNOSIS — Z6841 Body Mass Index (BMI) 40.0 and over, adult: Secondary | ICD-10-CM

## 2020-03-08 DIAGNOSIS — J9622 Acute and chronic respiratory failure with hypercapnia: Secondary | ICD-10-CM | POA: Diagnosis not present

## 2020-03-08 DIAGNOSIS — E870 Hyperosmolality and hypernatremia: Secondary | ICD-10-CM | POA: Diagnosis not present

## 2020-03-08 DIAGNOSIS — I6389 Other cerebral infarction: Secondary | ICD-10-CM | POA: Diagnosis not present

## 2020-03-08 DIAGNOSIS — D62 Acute posthemorrhagic anemia: Secondary | ICD-10-CM | POA: Diagnosis not present

## 2020-03-08 DIAGNOSIS — R29810 Facial weakness: Secondary | ICD-10-CM | POA: Diagnosis present

## 2020-03-08 DIAGNOSIS — E1165 Type 2 diabetes mellitus with hyperglycemia: Secondary | ICD-10-CM | POA: Diagnosis present

## 2020-03-08 DIAGNOSIS — Z823 Family history of stroke: Secondary | ICD-10-CM

## 2020-03-08 DIAGNOSIS — I63312 Cerebral infarction due to thrombosis of left middle cerebral artery: Principal | ICD-10-CM | POA: Diagnosis present

## 2020-03-08 DIAGNOSIS — E872 Acidosis: Secondary | ICD-10-CM | POA: Diagnosis present

## 2020-03-08 DIAGNOSIS — Z8249 Family history of ischemic heart disease and other diseases of the circulatory system: Secondary | ICD-10-CM

## 2020-03-08 DIAGNOSIS — I672 Cerebral atherosclerosis: Secondary | ICD-10-CM | POA: Diagnosis present

## 2020-03-08 DIAGNOSIS — G936 Cerebral edema: Secondary | ICD-10-CM | POA: Diagnosis present

## 2020-03-08 DIAGNOSIS — Z886 Allergy status to analgesic agent status: Secondary | ICD-10-CM

## 2020-03-08 DIAGNOSIS — Z79899 Other long term (current) drug therapy: Secondary | ICD-10-CM

## 2020-03-08 DIAGNOSIS — I69391 Dysphagia following cerebral infarction: Secondary | ICD-10-CM

## 2020-03-08 DIAGNOSIS — G8191 Hemiplegia, unspecified affecting right dominant side: Secondary | ICD-10-CM | POA: Diagnosis not present

## 2020-03-08 DIAGNOSIS — E871 Hypo-osmolality and hyponatremia: Secondary | ICD-10-CM | POA: Diagnosis not present

## 2020-03-08 DIAGNOSIS — I63319 Cerebral infarction due to thrombosis of unspecified middle cerebral artery: Secondary | ICD-10-CM

## 2020-03-08 DIAGNOSIS — R339 Retention of urine, unspecified: Secondary | ICD-10-CM | POA: Diagnosis not present

## 2020-03-08 DIAGNOSIS — D75839 Thrombocytosis, unspecified: Secondary | ICD-10-CM | POA: Diagnosis not present

## 2020-03-08 DIAGNOSIS — Z930 Tracheostomy status: Secondary | ICD-10-CM

## 2020-03-08 DIAGNOSIS — Z515 Encounter for palliative care: Secondary | ICD-10-CM

## 2020-03-08 DIAGNOSIS — I724 Aneurysm of artery of lower extremity: Secondary | ICD-10-CM | POA: Diagnosis not present

## 2020-03-08 DIAGNOSIS — K5901 Slow transit constipation: Secondary | ICD-10-CM | POA: Diagnosis not present

## 2020-03-08 DIAGNOSIS — J9601 Acute respiratory failure with hypoxia: Secondary | ICD-10-CM | POA: Diagnosis present

## 2020-03-08 DIAGNOSIS — Z7902 Long term (current) use of antithrombotics/antiplatelets: Secondary | ICD-10-CM

## 2020-03-08 DIAGNOSIS — R4701 Aphasia: Secondary | ICD-10-CM | POA: Diagnosis present

## 2020-03-08 DIAGNOSIS — I63512 Cerebral infarction due to unspecified occlusion or stenosis of left middle cerebral artery: Secondary | ICD-10-CM | POA: Diagnosis not present

## 2020-03-08 DIAGNOSIS — E11649 Type 2 diabetes mellitus with hypoglycemia without coma: Secondary | ICD-10-CM | POA: Diagnosis not present

## 2020-03-08 DIAGNOSIS — J9690 Respiratory failure, unspecified, unspecified whether with hypoxia or hypercapnia: Secondary | ICD-10-CM | POA: Diagnosis not present

## 2020-03-08 DIAGNOSIS — G43909 Migraine, unspecified, not intractable, without status migrainosus: Secondary | ICD-10-CM | POA: Diagnosis present

## 2020-03-08 DIAGNOSIS — F419 Anxiety disorder, unspecified: Secondary | ICD-10-CM | POA: Diagnosis present

## 2020-03-08 DIAGNOSIS — G8929 Other chronic pain: Secondary | ICD-10-CM | POA: Diagnosis present

## 2020-03-08 DIAGNOSIS — Z7984 Long term (current) use of oral hypoglycemic drugs: Secondary | ICD-10-CM | POA: Diagnosis not present

## 2020-03-08 DIAGNOSIS — Z7189 Other specified counseling: Secondary | ICD-10-CM

## 2020-03-08 DIAGNOSIS — R338 Other retention of urine: Secondary | ICD-10-CM | POA: Diagnosis not present

## 2020-03-08 DIAGNOSIS — R414 Neurologic neglect syndrome: Secondary | ICD-10-CM | POA: Diagnosis present

## 2020-03-08 DIAGNOSIS — E785 Hyperlipidemia, unspecified: Secondary | ICD-10-CM | POA: Diagnosis present

## 2020-03-08 DIAGNOSIS — N390 Urinary tract infection, site not specified: Secondary | ICD-10-CM

## 2020-03-08 DIAGNOSIS — R29721 NIHSS score 21: Secondary | ICD-10-CM | POA: Diagnosis present

## 2020-03-08 DIAGNOSIS — I6602 Occlusion and stenosis of left middle cerebral artery: Secondary | ICD-10-CM | POA: Diagnosis present

## 2020-03-08 DIAGNOSIS — Z91018 Allergy to other foods: Secondary | ICD-10-CM

## 2020-03-08 DIAGNOSIS — E782 Mixed hyperlipidemia: Secondary | ICD-10-CM | POA: Diagnosis not present

## 2020-03-08 DIAGNOSIS — Z430 Encounter for attention to tracheostomy: Secondary | ICD-10-CM | POA: Diagnosis not present

## 2020-03-08 DIAGNOSIS — Z9189 Other specified personal risk factors, not elsewhere classified: Secondary | ICD-10-CM

## 2020-03-08 DIAGNOSIS — Z720 Tobacco use: Secondary | ICD-10-CM | POA: Diagnosis present

## 2020-03-08 DIAGNOSIS — I69320 Aphasia following cerebral infarction: Secondary | ICD-10-CM | POA: Diagnosis not present

## 2020-03-08 DIAGNOSIS — D72829 Elevated white blood cell count, unspecified: Secondary | ICD-10-CM

## 2020-03-08 DIAGNOSIS — F17200 Nicotine dependence, unspecified, uncomplicated: Secondary | ICD-10-CM | POA: Diagnosis not present

## 2020-03-08 DIAGNOSIS — I10 Essential (primary) hypertension: Secondary | ICD-10-CM | POA: Diagnosis present

## 2020-03-08 DIAGNOSIS — R1312 Dysphagia, oropharyngeal phase: Secondary | ICD-10-CM | POA: Diagnosis not present

## 2020-03-08 DIAGNOSIS — J9600 Acute respiratory failure, unspecified whether with hypoxia or hypercapnia: Secondary | ICD-10-CM

## 2020-03-08 HISTORY — PX: IR CT HEAD LTD: IMG2386

## 2020-03-08 HISTORY — DX: Hyperlipidemia, unspecified: E78.5

## 2020-03-08 HISTORY — DX: Type 2 diabetes mellitus without complications: E11.9

## 2020-03-08 HISTORY — PX: IR PERCUTANEOUS ART THROMBECTOMY/INFUSION INTRACRANIAL INC DIAG ANGIO: IMG6087

## 2020-03-08 HISTORY — PX: RADIOLOGY WITH ANESTHESIA: SHX6223

## 2020-03-08 LAB — POCT I-STAT, CHEM 8
BUN: 16 mg/dL (ref 6–20)
Calcium, Ion: 1.13 mmol/L — ABNORMAL LOW (ref 1.15–1.40)
Chloride: 101 mmol/L (ref 98–111)
Creatinine, Ser: 0.8 mg/dL (ref 0.44–1.00)
Glucose, Bld: 413 mg/dL — ABNORMAL HIGH (ref 70–99)
HCT: 43 % (ref 36.0–46.0)
Hemoglobin: 14.6 g/dL (ref 12.0–15.0)
Potassium: 5.3 mmol/L — ABNORMAL HIGH (ref 3.5–5.1)
Sodium: 130 mmol/L — ABNORMAL LOW (ref 135–145)
TCO2: 21 mmol/L — ABNORMAL LOW (ref 22–32)

## 2020-03-08 LAB — COMPREHENSIVE METABOLIC PANEL
ALT: 30 U/L (ref 0–44)
AST: 24 U/L (ref 15–41)
Albumin: 3.4 g/dL — ABNORMAL LOW (ref 3.5–5.0)
Alkaline Phosphatase: 169 U/L — ABNORMAL HIGH (ref 38–126)
Anion gap: 12 (ref 5–15)
BUN: 13 mg/dL (ref 6–20)
CO2: 21 mmol/L — ABNORMAL LOW (ref 22–32)
Calcium: 8.8 mg/dL — ABNORMAL LOW (ref 8.9–10.3)
Chloride: 102 mmol/L (ref 98–111)
Creatinine, Ser: 1.11 mg/dL — ABNORMAL HIGH (ref 0.44–1.00)
GFR calc Af Amer: >60 mL/min (ref >60)
GFR calc non Af Amer: 59 mL/min — ABNORMAL LOW (ref >60)
Glucose, Bld: 251 mg/dL — ABNORMAL HIGH (ref 70–99)
Potassium: 4 mmol/L (ref 3.5–5.1)
Sodium: 135 mmol/L (ref 135–145)
Total Bilirubin: 0.4 mg/dL (ref 0.3–1.2)
Total Protein: 7.1 g/dL (ref 6.5–8.1)

## 2020-03-08 LAB — HIV ANTIBODY (ROUTINE TESTING W REFLEX): HIV Screen 4th Generation wRfx: NONREACTIVE

## 2020-03-08 LAB — POCT I-STAT 7, (LYTES, BLD GAS, ICA,H+H)
Acid-base deficit: 3 mmol/L — ABNORMAL HIGH (ref 0.0–2.0)
Acid-base deficit: 4 mmol/L — ABNORMAL HIGH (ref 0.0–2.0)
Bicarbonate: 21.8 mmol/L (ref 20.0–28.0)
Bicarbonate: 21.6 mmol/L (ref 20.0–28.0)
Calcium, Ion: 1.11 mmol/L — ABNORMAL LOW (ref 1.15–1.40)
Calcium, Ion: 1.13 mmol/L — ABNORMAL LOW (ref 1.15–1.40)
HCT: 42 % (ref 36.0–46.0)
HCT: 42 % (ref 36.0–46.0)
Hemoglobin: 14.3 g/dL (ref 12.0–15.0)
Hemoglobin: 14.3 g/dL (ref 12.0–15.0)
O2 Saturation: 95 %
O2 Saturation: 98 %
Patient temperature: 37.4
Patient temperature: 98
Potassium: 3.8 mmol/L (ref 3.5–5.1)
Potassium: 3.9 mmol/L (ref 3.5–5.1)
Sodium: 138 mmol/L (ref 135–145)
Sodium: 139 mmol/L (ref 135–145)
TCO2: 23 mmol/L (ref 22–32)
TCO2: 23 mmol/L (ref 22–32)
pCO2 arterial: 37.6 mmHg (ref 32.0–48.0)
pCO2 arterial: 39.2 mmHg (ref 32.0–48.0)
pH, Arterial: 7.372 (ref 7.350–7.450)
pH, Arterial: 7.347 — ABNORMAL LOW (ref 7.350–7.450)
pO2, Arterial: 80 mmHg — ABNORMAL LOW (ref 83.0–108.0)
pO2, Arterial: 103 mmHg (ref 83.0–108.0)

## 2020-03-08 LAB — URINALYSIS, ROUTINE W REFLEX MICROSCOPIC
Bilirubin Urine: NEGATIVE
Glucose, UA: 100 mg/dL — AB
Ketones, ur: NEGATIVE mg/dL
Leukocytes,Ua: NEGATIVE
Nitrite: NEGATIVE
Protein, ur: 30 mg/dL — AB
Specific Gravity, Urine: 1.015 (ref 1.005–1.030)
pH: 5 (ref 5.0–8.0)

## 2020-03-08 LAB — BASIC METABOLIC PANEL
Anion gap: 13 (ref 5–15)
BUN: 13 mg/dL (ref 6–20)
CO2: 20 mmol/L — ABNORMAL LOW (ref 22–32)
Calcium: 8.7 mg/dL — ABNORMAL LOW (ref 8.9–10.3)
Chloride: 103 mmol/L (ref 98–111)
Creatinine, Ser: 1.02 mg/dL — ABNORMAL HIGH (ref 0.44–1.00)
GFR calc Af Amer: >60 mL/min (ref >60)
GFR calc non Af Amer: >60 mL/min (ref >60)
Glucose, Bld: 174 mg/dL — ABNORMAL HIGH (ref 70–99)
Potassium: 4 mmol/L (ref 3.5–5.1)
Sodium: 136 mmol/L (ref 135–145)

## 2020-03-08 LAB — MAGNESIUM: Magnesium: 2 mg/dL (ref 1.7–2.4)

## 2020-03-08 LAB — URINALYSIS, MICROSCOPIC (REFLEX)

## 2020-03-08 LAB — CBC
HCT: 42.9 % (ref 36.0–46.0)
Hemoglobin: 14.5 g/dL (ref 12.0–15.0)
MCH: 32.8 pg (ref 26.0–34.0)
MCHC: 33.8 g/dL (ref 30.0–36.0)
MCV: 97.1 fL (ref 80.0–100.0)
Platelets: 383 10*3/uL (ref 150–400)
RBC: 4.42 MIL/uL (ref 3.87–5.11)
RDW: 13.7 % (ref 11.5–15.5)
WBC: 24.2 10*3/uL — ABNORMAL HIGH (ref 4.0–10.5)
nRBC: 0 % (ref 0.0–0.2)

## 2020-03-08 LAB — SODIUM: Sodium: 133 mmol/L — ABNORMAL LOW (ref 135–145)

## 2020-03-08 LAB — TROPONIN I (HIGH SENSITIVITY)
Troponin I (High Sensitivity): 17 ng/L (ref <18)
Troponin I (High Sensitivity): 9 ng/L (ref <18)

## 2020-03-08 LAB — GLUCOSE, CAPILLARY
Glucose-Capillary: 364 mg/dL — ABNORMAL HIGH (ref 70–99)
Glucose-Capillary: 248 mg/dL — ABNORMAL HIGH (ref 70–99)
Glucose-Capillary: 267 mg/dL — ABNORMAL HIGH (ref 70–99)
Glucose-Capillary: 264 mg/dL — ABNORMAL HIGH (ref 70–99)
Glucose-Capillary: 234 mg/dL — ABNORMAL HIGH (ref 70–99)
Glucose-Capillary: 190 mg/dL — ABNORMAL HIGH (ref 70–99)
Glucose-Capillary: 214 mg/dL — ABNORMAL HIGH (ref 70–99)
Glucose-Capillary: 176 mg/dL — ABNORMAL HIGH (ref 70–99)
Glucose-Capillary: 166 mg/dL — ABNORMAL HIGH (ref 70–99)
Glucose-Capillary: 172 mg/dL — ABNORMAL HIGH (ref 70–99)
Glucose-Capillary: 151 mg/dL — ABNORMAL HIGH (ref 70–99)
Glucose-Capillary: 276 mg/dL — ABNORMAL HIGH (ref 70–99)

## 2020-03-08 LAB — MRSA PCR SCREENING: MRSA by PCR: NEGATIVE

## 2020-03-08 LAB — PROTIME-INR
INR: 1.1 (ref 0.8–1.2)
Prothrombin Time: 13.6 seconds (ref 11.4–15.2)

## 2020-03-08 LAB — PHOSPHORUS: Phosphorus: 3.6 mg/dL (ref 2.5–4.6)

## 2020-03-08 LAB — APTT: aPTT: 25 seconds (ref 24–36)

## 2020-03-08 LAB — OSMOLALITY: Osmolality: 303 mOsm/kg — ABNORMAL HIGH (ref 275–295)

## 2020-03-08 SURGERY — IR WITH ANESTHESIA
Anesthesia: General

## 2020-03-08 MED ORDER — ACETAMINOPHEN 160 MG/5ML PO SOLN: 650.0000 mg | ORAL | Status: DC | PRN

## 2020-03-08 MED ORDER — FENTANYL BOLUS VIA INFUSION
50.0000 ug | INTRAVENOUS | Status: DC | PRN
  Filled 2020-03-08: qty 50

## 2020-03-08 MED ORDER — CLOPIDOGREL BISULFATE 300 MG PO TABS
ORAL_TABLET | ORAL | Status: AC
  Filled 2020-03-08: qty 1

## 2020-03-08 MED ORDER — ACETAMINOPHEN 650 MG RE SUPP: 650.0000 mg | RECTAL | Status: DC | PRN

## 2020-03-08 MED ORDER — INSULIN ASPART 100 UNIT/ML ~~LOC~~ SOLN
0.0000 [IU] | SUBCUTANEOUS | Status: DC
  Administered 2020-03-08: 8 [IU] via SUBCUTANEOUS
  Administered 2020-03-09: 5 [IU] via SUBCUTANEOUS
  Administered 2020-03-09: 3 [IU] via SUBCUTANEOUS
  Administered 2020-03-09: 8 [IU] via SUBCUTANEOUS
  Administered 2020-03-09 (×2): 3 [IU] via SUBCUTANEOUS
  Administered 2020-03-09 – 2020-03-10 (×2): 5 [IU] via SUBCUTANEOUS
  Administered 2020-03-10 – 2020-03-11 (×6): 3 [IU] via SUBCUTANEOUS
  Administered 2020-03-11: 5 [IU] via SUBCUTANEOUS
  Administered 2020-03-11: 3 [IU] via SUBCUTANEOUS
  Administered 2020-03-11 (×2): 8 [IU] via SUBCUTANEOUS
  Administered 2020-03-12: 5 [IU] via SUBCUTANEOUS
  Administered 2020-03-12: 2 [IU] via SUBCUTANEOUS
  Administered 2020-03-12: 3 [IU] via SUBCUTANEOUS
  Administered 2020-03-12 (×3): 5 [IU] via SUBCUTANEOUS
  Administered 2020-03-13: 3 [IU] via SUBCUTANEOUS
  Administered 2020-03-13 (×2): 5 [IU] via SUBCUTANEOUS
  Administered 2020-03-13: 2 [IU] via SUBCUTANEOUS
  Administered 2020-03-13 – 2020-03-14 (×3): 3 [IU] via SUBCUTANEOUS
  Administered 2020-03-14: 2 [IU] via SUBCUTANEOUS
  Administered 2020-03-14 (×3): 3 [IU] via SUBCUTANEOUS
  Administered 2020-03-14 – 2020-03-15 (×2): 2 [IU] via SUBCUTANEOUS
  Administered 2020-03-15 (×3): 3 [IU] via SUBCUTANEOUS
  Administered 2020-03-15: 2 [IU] via SUBCUTANEOUS
  Administered 2020-03-15: 3 [IU] via SUBCUTANEOUS
  Administered 2020-03-15: 2 [IU] via SUBCUTANEOUS
  Administered 2020-03-16 (×2): 5 [IU] via SUBCUTANEOUS
  Administered 2020-03-16 (×2): 3 [IU] via SUBCUTANEOUS
  Administered 2020-03-16: 2 [IU] via SUBCUTANEOUS
  Administered 2020-03-17 (×4): 5 [IU] via SUBCUTANEOUS
  Administered 2020-03-17: 3 [IU] via SUBCUTANEOUS
  Administered 2020-03-17 – 2020-03-18 (×6): 5 [IU] via SUBCUTANEOUS
  Administered 2020-03-18: 3 [IU] via SUBCUTANEOUS
  Administered 2020-03-19 – 2020-03-20 (×8): 8 [IU] via SUBCUTANEOUS
  Administered 2020-03-20: 15 [IU] via SUBCUTANEOUS
  Administered 2020-03-20 (×2): 8 [IU] via SUBCUTANEOUS
  Administered 2020-03-20: 5 [IU] via SUBCUTANEOUS
  Administered 2020-03-21: 8 [IU] via SUBCUTANEOUS
  Administered 2020-03-21: 11 [IU] via SUBCUTANEOUS
  Administered 2020-03-21: 8 [IU] via SUBCUTANEOUS
  Administered 2020-03-21: 15 [IU] via SUBCUTANEOUS
  Administered 2020-03-21 (×2): 8 [IU] via SUBCUTANEOUS
  Administered 2020-03-22: 5 [IU] via SUBCUTANEOUS
  Administered 2020-03-22 (×2): 11 [IU] via SUBCUTANEOUS
  Administered 2020-03-22 (×2): 8 [IU] via SUBCUTANEOUS
  Administered 2020-03-22: 5 [IU] via SUBCUTANEOUS
  Administered 2020-03-23: 3 [IU] via SUBCUTANEOUS
  Administered 2020-03-23 (×2): 5 [IU] via SUBCUTANEOUS
  Administered 2020-03-23 (×2): 3 [IU] via SUBCUTANEOUS
  Administered 2020-03-23: 5 [IU] via SUBCUTANEOUS
  Administered 2020-03-24 (×2): 3 [IU] via SUBCUTANEOUS
  Administered 2020-03-24 – 2020-03-25 (×4): 2 [IU] via SUBCUTANEOUS
  Administered 2020-03-25 (×3): 3 [IU] via SUBCUTANEOUS
  Administered 2020-03-26 (×3): 2 [IU] via SUBCUTANEOUS
  Administered 2020-03-26: 3 [IU] via SUBCUTANEOUS
  Administered 2020-03-26: 2 [IU] via SUBCUTANEOUS
  Administered 2020-03-27 (×3): 3 [IU] via SUBCUTANEOUS
  Administered 2020-03-27 – 2020-03-28 (×4): 2 [IU] via SUBCUTANEOUS
  Administered 2020-03-28: 3 [IU] via SUBCUTANEOUS
  Administered 2020-03-28 – 2020-03-29 (×2): 2 [IU] via SUBCUTANEOUS
  Administered 2020-03-30: 3 [IU] via SUBCUTANEOUS
  Administered 2020-03-30: 2 [IU] via SUBCUTANEOUS

## 2020-03-08 MED ORDER — PROPOFOL 1000 MG/100ML IV EMUL
0.0000 ug/kg/min | INTRAVENOUS | Status: DC
  Administered 2020-03-08: 10 ug/kg/min via INTRAVENOUS
  Administered 2020-03-08: 30 ug/kg/min via INTRAVENOUS
  Administered 2020-03-09 (×2): 15 ug/kg/min via INTRAVENOUS
  Administered 2020-03-09 – 2020-03-11 (×5): 20 ug/kg/min via INTRAVENOUS
  Administered 2020-03-12: 10 ug/kg/min via INTRAVENOUS
  Filled 2020-03-08 (×9): qty 100

## 2020-03-08 MED ORDER — CLEVIDIPINE BUTYRATE 0.5 MG/ML IV EMUL
INTRAVENOUS | Status: AC
  Administered 2020-03-08: 6 mg/h via INTRAVENOUS
  Filled 2020-03-08: qty 50

## 2020-03-08 MED ORDER — SODIUM CHLORIDE 0.9 % IV SOLN: INTRAVENOUS | Status: DC

## 2020-03-08 MED ORDER — IOHEXOL 350 MG/ML SOLN
40.0000 mL | Freq: Once | INTRAVENOUS | Status: AC | PRN
  Administered 2020-03-08: 40 mL via INTRAVENOUS

## 2020-03-08 MED ORDER — PROPOFOL 500 MG/50ML IV EMUL
INTRAVENOUS | Status: DC | PRN
  Administered 2020-03-08: 50 ug/kg/min via INTRAVENOUS

## 2020-03-08 MED ORDER — SUCCINYLCHOLINE CHLORIDE 200 MG/10ML IV SOSY
PREFILLED_SYRINGE | INTRAVENOUS | Status: DC | PRN
  Administered 2020-03-08: 100 mg via INTRAVENOUS

## 2020-03-08 MED ORDER — EPTIFIBATIDE 20 MG/10ML IV SOLN
INTRAVENOUS | Status: AC
  Filled 2020-03-08: qty 10

## 2020-03-08 MED ORDER — FENTANYL CITRATE (PF) 100 MCG/2ML IJ SOLN
50.0000 ug | Freq: Once | INTRAMUSCULAR | Status: DC
  Filled 2020-03-08: qty 2

## 2020-03-08 MED ORDER — FENTANYL 2500MCG IN NS 250ML (10MCG/ML) PREMIX INFUSION
50.0000 ug/h | INTRAVENOUS | Status: DC
  Administered 2020-03-08 – 2020-03-10 (×2): 50 ug/h via INTRAVENOUS
  Filled 2020-03-08 (×2): qty 250

## 2020-03-08 MED ORDER — PHENYLEPHRINE 40 MCG/ML (10ML) SYRINGE FOR IV PUSH (FOR BLOOD PRESSURE SUPPORT)
PREFILLED_SYRINGE | INTRAVENOUS | Status: DC | PRN
  Administered 2020-03-08: 40 ug via INTRAVENOUS
  Administered 2020-03-08: 80 ug via INTRAVENOUS
  Administered 2020-03-08 (×3): 40 ug via INTRAVENOUS
  Administered 2020-03-08: 80 ug via INTRAVENOUS
  Administered 2020-03-08: 40 ug via INTRAVENOUS

## 2020-03-08 MED ORDER — SODIUM CHLORIDE (PF) 0.9 % IJ SOLN
INTRAVENOUS | Status: DC | PRN
  Administered 2020-03-08 (×2): 25 ug via INTRA_ARTERIAL

## 2020-03-08 MED ORDER — ACETAMINOPHEN 160 MG/5ML PO SOLN
650.0000 mg | ORAL | Status: DC | PRN
  Administered 2020-03-09 – 2020-03-27 (×14): 650 mg
  Filled 2020-03-08 (×14): qty 20.3

## 2020-03-08 MED ORDER — NITROGLYCERIN 1 MG/10 ML FOR IR/CATH LAB
INTRA_ARTERIAL | Status: AC
  Filled 2020-03-08: qty 10

## 2020-03-08 MED ORDER — POLYETHYLENE GLYCOL 3350 17 G PO PACK
17.0000 g | PACK | Freq: Every day | ORAL | Status: DC
  Administered 2020-03-09 – 2020-03-28 (×14): 17 g
  Filled 2020-03-08 (×16): qty 1

## 2020-03-08 MED ORDER — ACETAMINOPHEN 325 MG PO TABS: 650.0000 mg | ORAL_TABLET | ORAL | Status: DC | PRN

## 2020-03-08 MED ORDER — PROPOFOL 10 MG/ML IV BOLUS
INTRAVENOUS | Status: DC | PRN
  Administered 2020-03-08: 30 mg via INTRAVENOUS
  Administered 2020-03-08: 120 mg via INTRAVENOUS
  Administered 2020-03-08: 50 mg via INTRAVENOUS

## 2020-03-08 MED ORDER — CLEVIDIPINE BUTYRATE 0.5 MG/ML IV EMUL
INTRAVENOUS | Status: AC
  Filled 2020-03-08: qty 50

## 2020-03-08 MED ORDER — ORAL CARE MOUTH RINSE
15.0000 mL | OROMUCOSAL | Status: DC
  Administered 2020-03-08 – 2020-03-30 (×190): 15 mL via OROMUCOSAL

## 2020-03-08 MED ORDER — FENTANYL CITRATE (PF) 100 MCG/2ML IJ SOLN
INTRAMUSCULAR | Status: AC
  Filled 2020-03-08: qty 2

## 2020-03-08 MED ORDER — IOHEXOL 300 MG/ML  SOLN
150.0000 mL | Freq: Once | INTRAMUSCULAR | Status: AC | PRN
  Administered 2020-03-08: 20 mL via INTRA_ARTERIAL

## 2020-03-08 MED ORDER — CHLORHEXIDINE GLUCONATE CLOTH 2 % EX PADS
6.0000 | MEDICATED_PAD | Freq: Every day | CUTANEOUS | Status: DC
  Administered 2020-03-08 – 2020-03-29 (×23): 6 via TOPICAL

## 2020-03-08 MED ORDER — DOCUSATE SODIUM 50 MG/5ML PO LIQD
100.0000 mg | Freq: Two times a day (BID) | ORAL | Status: DC
  Administered 2020-03-09 – 2020-03-28 (×27): 100 mg
  Filled 2020-03-08 (×31): qty 10

## 2020-03-08 MED ORDER — ACETAMINOPHEN 325 MG PO TABS
650.0000 mg | ORAL_TABLET | ORAL | Status: DC | PRN
  Administered 2020-03-20: 650 mg via ORAL
  Filled 2020-03-08 (×2): qty 2

## 2020-03-08 MED ORDER — TIROFIBAN HCL IN NACL 5-0.9 MG/100ML-% IV SOLN
INTRAVENOUS | Status: AC
  Filled 2020-03-08: qty 100

## 2020-03-08 MED ORDER — PANTOPRAZOLE SODIUM 40 MG IV SOLR
40.0000 mg | INTRAVENOUS | Status: DC
  Administered 2020-03-08: 40 mg via INTRAVENOUS
  Filled 2020-03-08: qty 40

## 2020-03-08 MED ORDER — TICAGRELOR 90 MG PO TABS
ORAL_TABLET | ORAL | Status: AC
  Filled 2020-03-08: qty 2

## 2020-03-08 MED ORDER — DEXTROSE 50 % IV SOLN
0.0000 mL | INTRAVENOUS | Status: DC | PRN
  Filled 2020-03-08: qty 50

## 2020-03-08 MED ORDER — SODIUM CHLORIDE 0.9 % IV SOLN: INTRAVENOUS | Status: DC | PRN

## 2020-03-08 MED ORDER — CEFAZOLIN SODIUM-DEXTROSE 2-4 GM/100ML-% IV SOLN
INTRAVENOUS | Status: AC
  Filled 2020-03-08: qty 100

## 2020-03-08 MED ORDER — VERAPAMIL HCL 2.5 MG/ML IV SOLN
INTRAVENOUS | Status: AC
  Filled 2020-03-08: qty 2

## 2020-03-08 MED ORDER — SODIUM CHLORIDE 3 % IV BOLUS: 250.0000 mL | Freq: Once | INTRAVENOUS | Status: DC

## 2020-03-08 MED ORDER — STROKE: EARLY STAGES OF RECOVERY BOOK
Freq: Once | Status: AC
  Filled 2020-03-08: qty 1

## 2020-03-08 MED ORDER — LABETALOL HCL 5 MG/ML IV SOLN
INTRAVENOUS | Status: DC | PRN
  Administered 2020-03-08: 10 mg via INTRAVENOUS
  Administered 2020-03-08: 5 mg via INTRAVENOUS

## 2020-03-08 MED ORDER — ASPIRIN 81 MG PO CHEW
CHEWABLE_TABLET | ORAL | Status: AC
  Filled 2020-03-08: qty 1

## 2020-03-08 MED ORDER — CLEVIDIPINE BUTYRATE 0.5 MG/ML IV EMUL: 0.0000 mg/h | INTRAVENOUS | Status: AC

## 2020-03-08 MED ORDER — INSULIN GLARGINE 100 UNIT/ML ~~LOC~~ SOLN
28.0000 [IU] | Freq: Every day | SUBCUTANEOUS | Status: DC
  Administered 2020-03-08 – 2020-03-18 (×11): 28 [IU] via SUBCUTANEOUS
  Filled 2020-03-08 (×13): qty 0.28

## 2020-03-08 MED ORDER — CEFAZOLIN SODIUM-DEXTROSE 2-3 GM-%(50ML) IV SOLR
INTRAVENOUS | Status: DC | PRN
  Administered 2020-03-08: 2 g via INTRAVENOUS

## 2020-03-08 MED ORDER — PHENYLEPHRINE HCL-NACL 10-0.9 MG/250ML-% IV SOLN
INTRAVENOUS | Status: DC | PRN
  Administered 2020-03-08: 25 ug/min via INTRAVENOUS

## 2020-03-08 MED ORDER — IOHEXOL 300 MG/ML  SOLN
150.0000 mL | Freq: Once | INTRAMUSCULAR | Status: AC | PRN
  Administered 2020-03-08: 75 mL via INTRA_ARTERIAL

## 2020-03-08 MED ORDER — ROCURONIUM BROMIDE 10 MG/ML (PF) SYRINGE
PREFILLED_SYRINGE | INTRAVENOUS | Status: DC | PRN
  Administered 2020-03-08: 60 mg via INTRAVENOUS
  Administered 2020-03-08: 30 mg via INTRAVENOUS
  Administered 2020-03-08: 40 mg via INTRAVENOUS
  Administered 2020-03-08: 60 mg via INTRAVENOUS

## 2020-03-08 MED ORDER — INSULIN REGULAR(HUMAN) IN NACL 100-0.9 UT/100ML-% IV SOLN
INTRAVENOUS | Status: DC | PRN
  Administered 2020-03-08: 14 [IU]/h via INTRAVENOUS

## 2020-03-08 MED ORDER — CHLORHEXIDINE GLUCONATE 0.12% ORAL RINSE (MEDLINE KIT)
15.0000 mL | Freq: Two times a day (BID) | OROMUCOSAL | Status: DC
  Administered 2020-03-08 – 2020-03-30 (×43): 15 mL via OROMUCOSAL

## 2020-03-08 MED ORDER — SODIUM CHLORIDE 3 % IV SOLN
INTRAVENOUS | Status: DC
  Filled 2020-03-08 (×7): qty 500

## 2020-03-08 MED ORDER — LIDOCAINE 2% (20 MG/ML) 5 ML SYRINGE
INTRAMUSCULAR | Status: DC | PRN
  Administered 2020-03-08: 50 mg via INTRAVENOUS

## 2020-03-08 MED ORDER — IOHEXOL 300 MG/ML  SOLN
50.0000 mL | Freq: Once | INTRAMUSCULAR | Status: AC | PRN
  Administered 2020-03-08: 45 mL via INTRA_ARTERIAL

## 2020-03-08 MED ORDER — SENNOSIDES-DOCUSATE SODIUM 8.6-50 MG PO TABS: 1.0000 | ORAL_TABLET | Freq: Every evening | ORAL | Status: DC | PRN

## 2020-03-08 MED ORDER — INSULIN ASPART 100 UNIT/ML ~~LOC~~ SOLN
SUBCUTANEOUS | Status: AC
  Filled 2020-03-08: qty 1

## 2020-03-08 MED ORDER — FENTANYL CITRATE (PF) 250 MCG/5ML IJ SOLN
INTRAMUSCULAR | Status: DC | PRN
Start: 2020-03-08 — End: 2020-03-08
  Administered 2020-03-08: 100 ug via INTRAVENOUS

## 2020-03-08 MED ORDER — INSULIN REGULAR(HUMAN) IN NACL 100-0.9 UT/100ML-% IV SOLN
INTRAVENOUS | Status: AC
  Administered 2020-03-08: 6.5 [IU]/h via INTRAVENOUS
  Administered 2020-03-08: 13 [IU]/h via INTRAVENOUS
  Filled 2020-03-08: qty 100

## 2020-03-08 NOTE — Progress Notes (Signed)
PT Cancellation Note  Patient Details Name: Deborah [name] MRN: [id number] DOB: 1971-01-08   Cancelled Treatment:    Reason Eval/Treat Not Completed: Patient not medically ready;Active bedrest order; will check back another day.   [name] 03/08/2020, 12:51 PM  [name], PT Acute Rehabilitation Services Pager:[phone] Office:[phone] 03/08/2020

## 2020-03-08 NOTE — H&P (Addendum)
Neurology H&P Reason for Admission: L MCA occlusion   CC: Not speaking, right side weakness  History is obtained from: Chart review, EMS, family   HPI: Deborah [name] is a 49 y.o. female past medical history significant for active smoking, diabetes, hyperlipidemia, chronic pain, migraines,  Per EMS report she was last known well at 107 PM by family.  Family reported that they had called several times later that night and patient had not responded which was unusual for her.  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  She presented to [facility] and was found to have a left MCA stroke with an aspects of 6 on their head CT, with a CTA confirming a left M2 cutoff.  She was brought to [facility] for thrombectomy.  Given her poor aspect score, CT perfusion was obtained prior to thrombectomy to confirm that there was tissue at risk.  Given that there was some tissue at risk and her young age, decision was made to proceed with thrombectomy.  Unfortunately, though revascularization was initially achieved, there was rapid reocclusion.  Given the significant size of her hypodensity as well as subarachnoid hemorrhage during the procedure, decision was made not to proceed with stenting as the risks of further intracranial hemorrhage were felt to outweigh the benefit of reperfusing the relatively small amount of additional tissue at risk.  Notably she had been undergoing outpatient work-up for syncopal episodes of unclear etiology.  Family reports that they had completed the heart rate monitoring and sent back the monitor but had not have been updated on results.  Her echocardiogram was minimally abnormal with grade 1 diastolic dysfunction  LKW: 8 PM on 9/20  tPA given?: No, due to out of the window and hypodensity on HCT IA performed?: No, or if yes, groin puncture time: 8:43 AM, first pass at ~9:10 AM; some delay due to obtaining  CTP prior to procedure and difficult access  Premorbid modified rankin scale:     0 - No symptoms.  ROS: Unable to obtain due to altered mental status.   Past Medical History:  Diagnosis Date  . Hypersomnia   . Sprain of right ankle, initial encounter   . Syncope    Family History  Problem Relation Age of Onset  . Heart defect Mother        Leaky Valve  . Heart Problems Father        Enlarged heart   . Heart defect Maternal Grandmother        Leaky Valve   . Stroke Paternal Grandfather    Social History:  reports that she has been smoking cigarettes. She has been smoking about 0.50 packs per day. She has never used smokeless tobacco. She reports previous alcohol use. She reports previous drug use.  Exam: Current vital signs: There were no vitals taken for this visit. Vital signs in last 24 hours:     Physical Exam  Constitutional: Appears well-developed and well-nourished. Obese  Psych: Affect appropriate to situation, flat, Eyes: No scleral injection HENT: No OP obstruction  MSK: no joint deformities Cardiovascular: Normal rate and regular rhythm, good bilateral pedal pulses  Respiratory: Effort normal, non-labored breathing GI: Soft.  No distension. There is no tenderness.  Skin: WDI visible skin  Neuro: Mental Status: Patient is alert, but mute, grunts to pain, interacts with gestures for communication Cranial Nerves: II: Visual Fields are notable for right hemianopia  by blink to threat. Pupils are equal, round, and reactive to light. 5.5 mm to 3 mm III,IV, VI: EOMI without ptosis or diploplia.  V: Facial sensation symmteric to eyelash brush VII: Facial movement is notable for right lower facial droop VIII: hearing is intact to voice (patient orients to voice) X: Uvula Unable to assess secondary to patient's mental status  XI: Shoulder shrug Unable to assess secondary to patient's mental status  XII: tongue Unable to assess secondary to patient's mental status   Motor: Tone is low on the right upper extremity. Maintains LUE antigravity for 5 seconds with significant gestures to coach. LLE quickly drifts to bed. RUE extensor posturing. RLE  Sensory: Less responsive to stim on the right Deep Tendon Reflexes: No clonus bilaterally Plantars: Toes are ugoing on the right and down on the left  Cerebellar: Unable to assess secondary to patient's mental status   NIH 21 just before CTA 2 for LOC questions, 2 for LOC commands, 1 for gaze, 2 for visual fields, 2 for facial droop, 2 for left arm, 3 for right arm and 3 for right leg, 1 for sensory, 3 for language  I have reviewed labs from [facility] and the results pertinent to this consultation are: Labs notable for Glucose 450s, AG 19, Cr 1.0, mild leukocyotosis, Utox positive for barbiturates and opiates (home meds), Ethanol negative  I have reviewed the images obtained: Dry Hct 8:10 AM -- ASPECTS 6 CTP 8:24 AM -- MMR 1.5, Core 63, Area at risk 95 mL   Impression:  This is a 49 year old woman with a past medical history significant for multiple risk factors for atherosclerosis as detailed above  Plan:  # L MCA stroke, unsuccessful re-vascularization - Stroke labs TSH, ESR, RPR, HgbA1c, fasting lipid panel - MRI brain, MRA of the brain without contrast 24 hours post procedure - Frequent neuro checks - Echocardiogram - Holding Aspirin due to allergy - Given patient on home Plavix, if non-compliant will load at 24 hours post procedure if no worsening bleed; if compliant will resume 75 mg daily (if no worsening bleed) - Risk factor modification - Telemetry monitoring; 30 day event monitor on discharge if no arrythmias captured  - PT consult, OT consult, Speech consult, unless patient is back to baseline - Stroke team to follow - Permission hypertension to max of 220/110; maintain MAP > 90  - Follow-up HCT at 16:00  - Sodium goal 135 - 145, until change in clinical status, then will give hypertonics   - Neurosurgery for hemicraniectomy / EVD watch   # Intubated for procedure - Remains intubated for high risk of clinical decline   # Severe hyperglycemia w/ anion gap metabolic acidosis  > Glucose 453 on arrival, AG 19 - Avoid hypotonic / borderline istonic solutions such as LR or D5 1/2 NS, started normal saline, D5 NS is okay too when needed - Appreciate Insulin drip and tirtration per CCM  # Utox positive for benzos and barbiturates > home Fioricet and alprazolam - Holding home alprazolam 0.5 mg TID, will need to confirm frequency of use - Monitor for alprazolam withdrawal, may need spot doses of benzos depending on frequency of use. Standing doses not ordered to avoid clouding mental status  # Migraines  # chronic pain  - Hold home firocet  - holding home flexeril 5 mg TID  - holding home gabapentin 300 mg BID - holding home tramadol 50 mg 1-2 tabs TID, needs med rec - Home erenumab monthly, unable to  confirm last dose  # Levofloxacin home medication -Need to clarify if this medication is actually being taken and if so, what was the indication  # Goals of care - Son and husband confirm patient would want aggressive measures if there is any chance of some interaction with family in the future  Lesleigh Noe MD-PhD [facility] [phone]  Addendum, follow-up head CT from 5:30 PM demonstrated somewhat worsening subarachnoid hemorrhage with some midline shift from the left MCA infarct that has progressed as expected.  Therefore started hypertonic saline with goal sodium updated to 150-155

## 2020-03-08 NOTE — Procedures (Signed)
S/P Lt common carotiod arteriogram followed by revascularization of occluded Lt MCA M 1 seg with x pass with aspiration,x 1 pass with tiger17  retrievera and x 1 pass with solitaire 68mm x 20 mm retriver and aspiration achieving a TICI 2C revascularization . 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 CT brain revealed large hypo attenuation  in the Lt MCA distribution with focal Lt perisylvian contrast and blood. No  Mass effect. Marland Kitchen Rescue stent not performed due to the  significantly  increased risk of ICH in lieu of anticipated use of dual antiplatelets ,oral and IV. Patient left intubated. Rt groin access site secured with an  47f angioseal closure device. Distal pulses dopplerable on the right  and palpable on the left. S.Cary Lothrop MD

## 2020-03-08 NOTE — Progress Notes (Signed)
Patient transported to CT & back on ventilator with no problems 

## 2020-03-08 NOTE — Progress Notes (Signed)
STROKE TEAM PROGRESS NOTE   INTERVAL HISTORY RN is at bedside. Pt lying in bed, still intubated but open eyes on voice, not following commands, left gaze preference with right hemiplegia. CT repeat showed increase midline shift. Dr. Maisie Fus from NSG is on board. No crani needed for now.  Vitals:   03/09/20 0700 03/09/20 0800 03/09/20 0831 03/09/20 0900  BP: 127/70 130/62  137/69  Pulse: 91 93  89  Resp: 16 (!) 25  20  Temp: 99.9 F (37.7 C) 99.9 F (37.7 C)  99.7 F (37.6 C)  TempSrc:      SpO2: 99% 100% 99% 99%  Weight:      Height:       CBC:  Recent Labs  Lab 03/08/20 1151 03/08/20 1151 03/08/20 1523 03/08/20 1544  WBC 24.2*  --   --   --   HGB 14.5   < > 14.3 14.3  HCT 42.9   < > 42.0 42.0  MCV 97.1  --   --   --   PLT 383  --   --   --    < > = values in this interval not displayed.   Basic Metabolic Panel:  Recent Labs  Lab 03/08/20 1151 03/08/20 1523 03/08/20 1533 03/08/20 1533 03/08/20 1544 03/08/20 2010  NA 135   < > 136   < > 139 133*  K 4.0   < > 4.0  --  3.9  --   CL 102  --  103  --   --   --   CO2 21*  --  20*  --   --   --   GLUCOSE 251*  --  174*  --   --   --   BUN 13  --  13  --   --   --   CREATININE 1.11*  --  1.02*  --   --   --   CALCIUM 8.8*  --  8.7*  --   --   --   MG 2.0  --   --   --   --   --   PHOS 3.6  --   --   --   --   --    < > = values in this interval not displayed.   Lipid Panel:  Recent Labs  Lab 03/09/20 0244  CHOL 288*  TRIG 371*  HDL 40*  CHOLHDL 7.2  VLDL 74*  LDLCALC 174*   HgbA1c:  Recent Labs  Lab 03/09/20 1119  HGBA1C 10.7*   Urine Drug Screen:  Recent Labs  Lab 03/09/20 0300  LABOPIA NONE DETECTED  COCAINSCRNUR NONE DETECTED  LABBENZ POSITIVE*  AMPHETMU NONE DETECTED  THCU NONE DETECTED  LABBARB POSITIVE*    Alcohol Level No results for input(s): ETH in the last 168 hours.  IMAGING past 24 hours CT HEAD WO CONTRAST  Result Date: 03/09/2020 CLINICAL DATA:  Parenchymal hemorrhage,  follow-up. EXAM: CT HEAD WITHOUT CONTRAST TECHNIQUE: Contiguous axial images were obtained from the base of the skull through the vertex without intravenous contrast. COMPARISON:  Prior head CT examinations 03/09/2020 at 12:58 a.m. and earlier. FINDINGS: Brain: Continued interval evolution of cytotoxic edema throughout much of the left MCA vascular territory consistent with acute/early subacute infarction, not significantly changed in extent as compared to the most recent prior head CT. Continued slight interval increase in mass effect with partial effacement of the left lateral ventricle and now with 6 mm rightward midline shift (previously  5 mm). The basal cisterns remain patent. No evidence of ventricular entrapment. Unchanged small foci of parenchymal hemorrhage within the left basal ganglia. Unchanged appearance of additional scattered hemorrhage at the level of the left sylvian fissure and left frontal operculum. No interval hemorrhage is identified. No new large vessel territory infarct. Vascular: Redemonstrated hyperdensity within the M1 left MCA and proximal left M2 branches consistent with known reocclusion/thrombus. Skull: Normal. Negative for fracture or focal lesion. Sinuses/Orbits: Visualized orbits show no acute finding. Moderate ethmoid sinus mucosal thickening small amount of frothy secretions within the bilateral sphenoid sinuses. No significant mastoid effusion. IMPRESSION: Unchanged extent of a large acute/early subacute left MCA vascular territory infarct and associated hemorrhage as compared to the head CT performed earlier the same day at 12:57 a.m. Continued slight interval increase in mass effect with partial effacement of the left lateral ventricle and now 6 mm rightward midline shift (previously 5 mm). The basal cisterns remain patent. No evidence of ventricular entrapment. Persistent hyperdensity within the M1 left middle cerebral artery and proximal left MCA branches consistent with  known reocclusion/thrombus. Electronically Signed   By: Jackey LogeKyle  Golden DO   On: 03/09/2020 08:27   CT HEAD WO CONTRAST  Result Date: 03/09/2020 CLINICAL DATA:  Follow-up examination for parenchymal hemorrhage. EXAM: CT HEAD WITHOUT CONTRAST TECHNIQUE: Contiguous axial images were obtained from the base of the skull through the vertex without intravenous contrast. COMPARISON:  Prior CT from 03/08/2020. FINDINGS: Brain: Continued interval evolution of cytotoxic edema throughout much of the left MCA territory, overall similar in size and distribution from previous. Scattered foci of superimposed parenchymal hemorrhage at the left lentiform nucleus are relatively unchanged. Additional scattered hemorrhage at the level of the left sylvian fissure and operculum has somewhat dispersed since previous, now less defined and less dense as compared to previous. No definite new hemorrhage. Regional mass effect with partial effacement of the left lateral ventricle has mildly worsened. Associated left-to-right midline shift now measures up to 5 mm. No other new large vessel territory infarct. No extra-axial fluid collection. Vascular: Persistent hyperdensity within the left M1 segment and proximal left MCA branches, consistent with reocclusion/thrombus. Skull: Scalp soft tissues within normal limits.  Calvarium intact. Sinuses/Orbits: Globes and orbital soft tissues demonstrate no acute finding. Scattered mucosal thickening noted within the ethmoidal air cells. Paranasal sinuses are otherwise clear. No mastoid effusion. Other: None. IMPRESSION: 1. Continued interval evolution of large left MCA territory infarct, overall similar in size and distribution from previous. Associated regional mass effect with partial effacement of the left lateral ventricle has mildly worsened, with worsened left-to-right midline shift now measuring up to 5 mm. 2. Scattered foci of superimposed parenchymal hemorrhage within the area of infarction,  stable to slightly decreased from previous. A portion of the apparent improvement in this finding may reflect interval clearing of contrast staining. 3. Persistent hyperdensity within the left M1 segment and proximal left MCA branches, consistent with reocclusion/thrombus. 4. No other new acute intracranial abnormality. Electronically Signed   By: Rise MuBenjamin  McClintock M.D.   On: 03/09/2020 01:51   CT HEAD WO CONTRAST  Result Date: 03/08/2020 CLINICAL DATA:  Stroke, follow-up. EXAM: CT HEAD WITHOUT CONTRAST TECHNIQUE: Contiguous axial images were obtained from the base of the skull through the vertex without intravenous contrast. COMPARISON:  Noncontrast head CT examine age performed earlier the same day 03/08/2020 at 7:57 a.m. and 10:26 a.m., CT angiogram head/neck 03/08/2020, neuro interventional procedural note from earlier the same day. FINDINGS: Brain: As compared to the  head CT performed earlier the same day at 7:57 a.m., there has been significant interval progression of acute ischemic infarction changes within the left MCA vascular territory affecting portions of the left frontal lobe, parietal lobe, temporal lobe and left insula. The infarct is now large. Small foci of parenchymal hemorrhage within the left basal ganglia do not appear significantly changed from the head CT performed earlier the same day at 10:26 a.m. However, slightly increased from this prior examination, there has been a slight interval increase in a small to moderate amount of hemorrhage and/or contrast within the left perisylvian region and more superiorly along the left cerebral convexity. There is increased mass effect with increased partial effacement of the left lateral ventricle and now 4 mm rightward midline shift. Vascular: Residual circulating contrast limits evaluation for abnormal vessel hyperdensity Skull: Normal. Negative for fracture or focal lesion. Sinuses/Orbits: Visualized orbits show no acute finding. Mild ethmoid  sinus mucosal thickening. Small right ethmoid sinus osteoma. No significant mastoid effusion. These results will be called to the ordering clinician or representative by the Radiologist Assistant, and communication documented in the PACS or [employer]. IMPRESSION: Significantly increased acute ischemic infarction changes within the left MCA vascular territory as compared to the head CT performed earlier the same day at 7:57 a.m. The left MCA infarct is now large. Increased mass effect and partial effacement of the left lateral ventricle now with 4 mm rightward midline shift. Small foci of parenchymal hemorrhage within the left basal ganglia do not appear significantly changed from the head CT performed earlier the same day at 10:26 a.m. However, small to moderate volume hemorrhage and/or contrast within the left perisylvian region and more superiorly along the left cerebral convexity has slightly increased since this prior study. Electronically Signed   By: Jackey Loge DO   On: 03/08/2020 18:18   DG Chest Port 1 View  Result Date: 03/09/2020 CLINICAL DATA:  Hypoxia EXAM: PORTABLE CHEST 1 VIEW COMPARISON:  March 08, 2020 FINDINGS: Endotracheal tube tip is at the origin of the right main bronchus. Nasogastric tube tip and side port in stomach. No pneumothorax. There is atelectatic change in the left base. Lungs otherwise are clear. Heart is upper normal in size with pulmonary vascularity normal. No adenopathy. No bone lesions. IMPRESSION: Endotracheal tube tip in proximal right main bronchus. Advise withdrawing endotracheal tube approximately 3.5 cm. Atelectatic change left base. Lungs otherwise clear. No pneumothorax. Stable cardiac silhouette. Critical Value/emergent results were called by telephone at the time of interpretation on 03/09/2020 at 8:01 am to provider Philbert Riser, RN, who verbally acknowledged these results. Electronically Signed   By: Bretta Bang III M.D.   On: 03/09/2020 08:01    DG Abd Portable 1V  Result Date: 03/09/2020 CLINICAL DATA:  Nasogastric tube placement EXAM: PORTABLE ABDOMEN - 1 VIEW COMPARISON:  None. FINDINGS: Nasogastric tube tip and side port are in the stomach. There is no bowel dilatation or air-fluid level to suggest bowel obstruction. No free air. Surgical clips noted in lower abdomen slightly to the left of midline. IMPRESSION: Nasogastric tube tip and side port in stomach. No bowel obstruction or free air evident on supine examination. Electronically Signed   By: Bretta Bang III M.D.   On: 03/09/2020 07:59   VAS [place] GROIN PSEUDOANEURYSM  Result Date: 03/09/2020  ARTERIAL PSEUDOANEURYSM  Exam: Right groin Indications: Bleeding from access site, s/p cerebral arteriogram with mechanical thrombectomy via right femoral approach 03/08/2020. Performing Technologist: Gertie Fey MHA, RDMS, RVT, RDCS  Examination Guidelines: A complete evaluation includes B-mode imaging, spectral Doppler, color Doppler, and power Doppler as needed of all accessible portions of each vessel. Bilateral testing is considered an integral part of a complete examination. Limited examinations for reoccurring indications may be performed as noted. +------------+----------+---------+------+----------+ Right DuplexPSV (cm/s)Waveform PlaqueComment(s) +------------+----------+---------+------+----------+ Ext.Iliac      211    triphasic                 +------------+----------+---------+------+----------+ CFA            211    triphasic                 +------------+----------+---------+------+----------+ Prox SFA       -104   triphasic                 +------------+----------+---------+------+----------+ Right Vein comments: right common femoral vein is patent  Summary: No evidence of pseudoaneurysm, AVF or DVT    --------------------------------------------------------------------------------    Preliminary    [place] EKG SITE RITE  Result Date: 03/09/2020 If  Site Rite image not attached, placement could not be confirmed due to current cardiac rhythm.   PHYSICAL EXAM  Temp:  [98.1 F (36.7 C)-100 F (37.8 C)] 100 F (37.8 C) (09/22 1000) Pulse Rate:  [65-96] 82 (09/22 1000) Resp:  [12-25] 18 (09/22 1000) BP: (103-170)/(60-89) 145/72 (09/22 1000) SpO2:  [91 %-100 %] 100 % (09/22 1000) Arterial Line BP: (86-175)/(38-160) 164/160 (09/22 0145) FiO2 (%):  [40 %-50 %] 50 % (09/22 0800)  General - Well nourished, well developed, intubated on sedation.  Ophthalmologic - fundi not visualized due to noncooperation.  Cardiovascular - Regular rate and rhythm.  Neuro - intubated on sedation, eyes close but able to open on voice, not following commands. With eye opening, eyes in left gaze preference but able to cross midline briefly, right gaze incomplete, not blinking to visual threat, doll's eyes present, not tracking, PERRL. Corneal reflex present, gag and cough present. Breathing over the vent.  Facial symmetry not able to test due to ET tube.  Tongue protrusion not cooperative. Able to hold LUE against gravity without drift, LLE mild withdraw to pain. RUE flaccid and RLE slight withdraw to pain. DTR 1+ and no babinski. Sensation, coordination and gait not tested.   ASSESSMENT/PLAN Ms. Saroya Riccobono is a 49 y.o. female with history of difficulty to control DB, HLD, syncope presenting to [facility] with R hemiparesis, facial droop, left gaze, and global aphasia w/ glucose 400s where she was found to have L M1 occlusion and transferred to [facility] for IR.   Stroke:   L MCA infarct s/p IR w/ post procedure hemorrhage, infarct secondary to large vessel disease source  CT head [facility]) L MCA infarct w/ ASPECTS 6  CTA head & neck [facility]) L M2 cutoff  CT head L MCA territory infarct. ASPECTS 6.   CT perfusion 63 core L brain same as CT. 32 penumbra. 1.5 mismatch ratio.  Cerebral angio / IR - L  M1 occlusion s/p TICI2c revascularization w/ reocclusion d/t severe underlying stenosis. Post IR hemorrhage.  CT head 9/21 1818 increased L MCA ischemic infarct w/ mass effect and partial effacement L lateral ventrilc w/ 85mm midline shift. Small foci L BG HT same w/ increased L perisylvian and L cerebral convexity hemorrhage.   CT head 9/22 0151 evolution large L MCA infarct w/ slightly worse effacement and mass effect w/ 51mm midline shift. Scattered foci hemorrhage in infarct  same w/ contrast clearing. L M1 and proximal L MCA branches w/ persistent hyperdensity.   CT head 9/22 0827  L MCA infarct and hemorrhages same. Increase in mass effect and effacement now 30mm midline shift. L M1 and proximal L MCA braches w/ persistent hyperdensity.   MRI  pending   MRA  pending   2D Echo pending   Recent Zio patch Normal w/ infrequent PACs, PVCs 01/07/2020  LDL 174   HgbA1c 10.7   VTE prophylaxis - SCDs   clopidogrel 75 mg daily prior to admission, now on No antithrombotic given post IR hemorrhage and possible hemicraniectomy   Therapy recommendations:  pending   Disposition:  pending   Acute Respiratory Failure  Secondary to stroke  Intubated for IR, left intubated post IR    sedated  CCM on board   Cerebral Edema  See imaging above  On 3% protocol - @75h   Goal Na 150-155  Na 133->144->145->149  NS onboard ) - no hemicrani yet  HOB > 30  Hold antiplatelets and keep NPO  Place PICC  R Groin Firmness, improved  R distal pulse 1+  Groin Doppler R common femoral patent, no pseudoaneurysm, AVF or DVT.   Continue monitoring  Blood Pressure  Home meds:  None, no hx HTN  Stable . BP goal < 160 for now due to HT   . Treated w/ Cleviprex gtt - taper off as able . Long-term BP goal normotensive  Hyperlipidemia  Home meds:  lipitor 20  LDL 174, goal < 70  On lipitor 80  Plan to continue statin at discharge  Diabetes type II Uncontrolled  Home  meds:  Glimepiride , lantus 21, liraglutide 1.8  HgbA1c 10.7, goal < 7.0  CBGs  Hyperglycemia   SSI 0-15  On lantus 28  DM coordinator consulted  Dysphagia . Secondary to stroke . NPO except meds . Keep NPO for possible hemicraniectomy  . Speech on board  Tobacco abuse  Current smoker  Will provide smoking cessation when able    Other Stroke Risk Factors  UDS positive benzos and barb (done following IR procedure)   Obesity, Body mass index is 35.22 kg/m., recommend weight loss, diet and exercise as appropriate   Family hx stroke (paternal grandfather)  Migraines on fioricet  Other Active Problems  Leukocytosis WBC 24.2->18.7   Chronic pain, Anxiety  Hospital day # 1  This patient is critically ill due to large left MCA stroke status post thrombectomy with reocclusion, respiratory failure, hemorrhagic conversion with midline shift, cerebral edema, dysphagia, and at significant risk of neurological worsening, death form brain herniation, recurrent stroke, hematoma expansion, heart failure, aspiration, sepsis, seizure. This patient's care requires constant monitoring of vital signs, hemodynamics, respiratory and cardiac monitoring, review of multiple databases, neurological assessment, discussion with family, other specialists and medical decision making of high complexity. I spent 30 minutes of neurocritical care time in the care of this patient.  Maisie Fus, MD PhD Stroke Neurology 03/09/2020 6:36 PM   To contact Stroke Continuity provider, please refer to 03/11/2020. After hours, contact General Neurology

## 2020-03-08 NOTE — Anesthesia Procedure Notes (Signed)
Procedure Name: Intubation Date/Time: 03/08/2020 8:26 AM Performed by: Adria Dill, CRNA Pre-anesthesia Checklist: Patient identified, Emergency Drugs available, Suction available and Patient being monitored Patient Re-evaluated:Patient Re-evaluated prior to induction Oxygen Delivery Method: Circle system utilized Preoxygenation: Pre-oxygenation with 100% oxygen Induction Type: IV induction, Rapid sequence and Cricoid Pressure applied Laryngoscope Size: Miller and 2 Grade View: Grade I Tube type: Oral Tube size: 7.5 mm Number of attempts: 1 Airway Equipment and Method: Stylet and Oral airway Placement Confirmation: ETT inserted through vocal cords under direct vision,  positive ETCO2 and breath sounds checked- equal and bilateral Secured at: 21 cm Tube secured with: Tape Dental Injury: Teeth and Oropharynx as per pre-operative assessment

## 2020-03-08 NOTE — Consult Note (Signed)
NAME:  Deborah [name], MRN:  [id number], DOB:  09/30/1970, LOS: 0 ADMISSION DATE:  03/08/2020, CONSULTATION DATE:  03/08/20 REFERRING MD:  Neuro, CHIEF COMPLAINT:  AMS   Brief History   49 y/o female with history of IDDM, HLD, tobacco use disorder presented to [facility] for facial droop, right sided weakness and global aphasia. Found to have LVO of left M1 segment and was transferred to [facility] for attempt at endovascular revascularization.   History of present illness   49 y/o female presented after being found by her husband this morning with facial droop, right sided weakness, and global aphasia. LKN was 8:00 pm the night before. NIH score of 21 on arrival. She was immediately taken to IR and intubated for attempt at endovascular revascularization. Unfortunately, could not revascularize after multiple attempts. She was transferred to the ICU for continued monitoring post-procedure.   Past Medical History  IDDM HLD Tobacco use Migraine headaches Chronic pain  Anxiety   Significant Hospital Events   9/21 > intubated for IR procedure 9/21> unsuccessful revascularization 9/21> admitted to neuro ICU  Consults:  PCCM  Procedures:  9/21>cerebral angiogram   Significant Diagnostic Tests:  9/21 CT head > Acute left MCA territory infarct without hemorrhagic conversion or progression from CT earlier today. Aspects remains 6   Micro Data:  N/A  Antimicrobials:  N/A   Objective   Height 5' (1.524 m), weight 81.8 kg, SpO2 95 %.    Vent Mode: PRVC FiO2 (%):  [40 %] 40 % Set Rate:  [18 bmp] 18 bmp Vt Set:  [370 mL] 370 mL PEEP:  [5 cmH20] 5 cmH20 Plateau Pressure:  [18 cmH20] 18 cmH20   Intake/Output Summary (Last 24 hours) at 03/08/2020 1159 Last data filed at 03/08/2020 1030 Gross per 24 hour  Intake 400 ml  Output 550 ml  Net -150 ml   Filed Weights   03/08/20 0923  Weight: 81.8 kg    Examination: General: intubated, sedated, no apparent distress  HENT: ETT in  place  Lungs: clear to auscultation bilaterally  Cardiovascular: RRR; normal S1-S2, 2+ pulses bilaterally Abdomen: BS+, soft, non-distended Extremities: warm and well perfused; normal cap refill, no edema  Neuro: sedated Skin: warm, dry, no rashes   Resolved Hospital Problem list     Assessment & Plan:  Left MCA stroke, unsuccessful re-vascularization -neurology following -MRI, MRA brain 24 hours post procedure -no aspirin due to documented allergy; resume plavix 24 hours post procedure if no worsening bleed -A1C, lipid panel pending -TTE -Tele monitoring  -BP goal post procedure 120-140; continue cleveprex, low dose pressor as needed  -anticipate need for neurosurgical intervention for management of swelling   Endotracheally intubated for procedure -Continue mechanical ventilation for now due to high risk of clinical decompensation -PAD protocol with fentanyl, propofol -VAP bundle   IDDM with severe hyperglycemia -no evidence of HHS or DKA -will continue insulin drip for now but can likely transition to subQ over the next several hours  -avoid LR or hypotonic solutions due to above   Migraines Chronic pain, anxiety -holding home medications (xanax, fioricet, gabapentin, flexeril, tramadol) to accurately monitor mental status -monitor for signs of withdrawal   Best practice:  Diet: NPO Pain/Anxiety/Delirium protocol (if indicated): fentanyl, propofol VAP protocol (if indicated): Y DVT prophylaxis: SCDs  GI prophylaxis: None Glucose control: Insulin drip Mobility: bedrest Code Status: FULL Family Communication: son and wife updated by primary; want aggressive measures if there is chance of meaningful interaction with family Disposition: ICU  Labs   CBC: No results for input(s): WBC, NEUTROABS, HGB, HCT, MCV, PLT in the last 168 hours.  Basic Metabolic Panel: No results for input(s): NA, K, CL, CO2, GLUCOSE, BUN, CREATININE, CALCIUM, MG, PHOS in the last 168  hours. GFR: CrCl cannot be calculated (No successful lab value found.). No results for input(s): PROCALCITON, WBC, LATICACIDVEN in the last 168 hours.  Liver Function Tests: No results for input(s): AST, ALT, ALKPHOS, BILITOT, PROT, ALBUMIN in the last 168 hours. No results for input(s): LIPASE, AMYLASE in the last 168 hours. No results for input(s): AMMONIA in the last 168 hours.  ABG No results found for: PHART, PCO2ART, PO2ART, HCO3, TCO2, ACIDBASEDEF, O2SAT   Coagulation Profile: No results for input(s): INR, PROTIME in the last 168 hours.  Cardiac Enzymes: No results for input(s): CKTOTAL, CKMB, CKMBINDEX, TROPONINI in the last 168 hours.  HbA1C: No results found for: HGBA1C  CBG: Recent Labs  Lab 03/08/20 0946 03/08/20 1103 03/08/20 1119  GLUCAP 364* 267* 248*    Review of Systems:   Review of Systems  Unable to perform ROS: Intubated    Past Medical History  She,  has a past medical history of DM (diabetes mellitus), type 2 (HCC), Hyperlipidemia, Hypersomnia, Sprain of right ankle, initial encounter, and Syncope.   Social History   reports that she has been smoking cigarettes. She has been smoking about 0.50 packs per day. She has never used smokeless tobacco. She reports previous alcohol use. She reports previous drug use.   Family History   Her family history includes Heart Problems in her father; Heart defect in her maternal grandmother and mother; Stroke in her paternal grandfather.   Allergies Allergies  Allergen Reactions  . Aspirin Shortness Of Breath     Home Medications  Prior to Admission medications   Medication Sig Start Date End Date Taking? Authorizing Provider  acetaminophen (TYLENOL) 325 MG tablet Take 325 mg by mouth every 6 (six) hours as needed.    [provider]  albuterol (VENTOLIN HFA) 108 (90 Base) MCG/ACT inhaler Inhale 2 puffs into the lungs every 6 (six) hours as needed.    [provider]  ALPRAZolam Prudy Feeler)  0.5 MG tablet Take 0.5 mg by mouth 3 (three) times daily as needed. Take 1/2 to 1 tablet by mouth TID PRN 06/20/16   [provider]  atorvastatin (LIPITOR) 20 MG tablet Take 20 mg by mouth daily. 04/18/16   [provider]  Butalbital-APAP-Caffeine 50-325-40 MG capsule TK 1 C PO BID PRN FOR MIGRAINES 04/18/16   [provider]  clopidogrel (PLAVIX) 75 MG tablet Take 1 tablet (75 mg total) by mouth daily. 10/29/19   Georgeanna Lea, MD  cyclobenzaprine (FLEXERIL) 5 MG tablet TK 1 T PO TID PRF BACK PAIN 06/20/16   [provider]  Erenumab-aooe 70 MG/ML SOAJ Inject into the skin. Once Monthly    [provider]  gabapentin (NEURONTIN) 300 MG capsule TK ONE C PO BID P 07/10/16   [provider]  glimepiride (AMARYL) 2 MG tablet Take 2 mg by mouth daily with breakfast.    [provider]  glucose blood (ACCU-CHEK AVIVA PLUS) test strip USE 1 STRIP UTD 07/08/16   [provider]  insulin glargine (LANTUS SOLOSTAR) 100 UNIT/ML Solostar Pen INJ 21 UNITS Bromide D 07/10/16   [provider]  Insulin Pen Needle (NOVOFINE) 32G X 6 MM MISC USE BID 07/04/16   [provider]  Lancets (ACCU-CHEK MULTICLIX) lancets  U TID 07/03/16   [provider]  levofloxacin (LEVAQUIN) 500 MG tablet Take by mouth. 08/27/16   [provider]  liraglutide (VICTOZA) 18 MG/3ML SOPN Inject into the skin. 1.8 daily    [provider]  loratadine (CLARITIN) 10 MG tablet Take by mouth.    [provider]  promethazine (PHENERGAN) 25 MG tablet TK 1 T PO TID PRN N OR DIZZINESS 06/28/16   [provider]  traMADol (ULTRAM) 50 MG tablet TK 1 TO 2 TS PO TID 07/10/16   [provider]     Lenward Chancellor DO, DO IMTS, PGY-3 03/08/20, 1:56 PM

## 2020-03-08 NOTE — ED Provider Notes (Signed)
  Physical Exam  There were no vitals taken for this visit.  Physical Exam  ED Course/Procedures     Ultrasound ED Peripheral IV (Provider)  Date/Time: 03/08/2020 8:21 AM Performed by: Alvira Monday, MD Authorized by: Alvira Monday, MD   Procedure details:    Indications: multiple failed IV attempts     Skin Prep: chlorhexidine gluconate     Location:  Right AC   Angiocath:  18 G   Bedside Ultrasound Guided: Yes     Images: not archived     Patient tolerated procedure without complications: Yes     Dressing applied: Yes      MDM  49yo female arrives from [place] as a direct admission/CODE STROKE. Placed IV, pt taken to IR for intervention.        Alvira Monday, MD 03/08/20 2132

## 2020-03-08 NOTE — Anesthesia Procedure Notes (Signed)
Arterial Line Insertion Start/End9/21/2021 8:35 AM, 03/08/2020 8:35 AM Performed by: Achille Rich, MD, anesthesiologist  Patient location: OOR procedure area. Preanesthetic checklist: patient identified, IV checked, site marked, risks and benefits discussed, surgical consent, monitors and equipment checked, pre-op evaluation, timeout performed and anesthesia consent Emergency situation Left, radial was placed Catheter size: 20 G Hand hygiene performed , maximum sterile barriers used  and Seldinger technique used Allen's test indicative of satisfactory collateral circulation Attempts: 1 Procedure performed without using ultrasound guided technique. Following insertion, dressing applied and Biopatch. Post procedure assessment: normal  Patient tolerated the procedure well with no immediate complications.

## 2020-03-08 NOTE — Progress Notes (Signed)
Bedside report to 4N RN groin/ pulses checked

## 2020-03-08 NOTE — Code Documentation (Addendum)
Patient from [facility] ED after a Code IR paged. Pt LKW 9/20 2000. She was found this am by her husband with a facial droop, right sided weakness, and no speech. [facility] health noted right hemiparesis, facial droop, left gaze, and global aphasia. Her blood sugar had been in the 400's per EMS. A CTA was completed and they called the code IR and set up the transfer. Pt arrived to [facility] ED and taken right to scanner for a CT and CTP. Neurologist and SRN at pt's bedside. Delay in IV access for CTP and placing orders after pt arrived to [facility]. After scans resulted, Neurologist and Interventionalist agreed to the procedure. Pt taken to IR [facility] and prepped by RN and anesthesia. Pt's NIHSS 24 at this time. Pt's had been COVID swabbed at [facility] and results (negative) were faxed to [facility] to be placed in her chart. Hand off with Artist.

## 2020-03-08 NOTE — Anesthesia Preprocedure Evaluation (Signed)
Anesthesia Evaluation  Patient identified by MRN, date of birth, ID band Patient unresponsive  Preop documentation limited or incomplete due to emergent nature of procedure.  History of Anesthesia Complications (+) PONV and history of anesthetic complications  Airway   TM Distance: <3 FB    Comment: Unable to cooperate with exam Dental   Pulmonary Current Smoker,    breath sounds clear to auscultation       Cardiovascular negative cardio ROS   Rhythm:regular Rate:Normal     Neuro/Psych PSYCHIATRIC DISORDERS Anxiety CVA    GI/Hepatic   Endo/Other  diabetes, Poorly Controlled, Insulin Dependent  Renal/GU      Musculoskeletal   Abdominal   Peds  Hematology   Anesthesia Other Findings   Reproductive/Obstetrics                             Anesthesia Physical Anesthesia Plan  ASA: III and emergent  Anesthesia Plan: General   Post-op Pain Management:    Induction: Intravenous and Cricoid pressure planned  PONV Risk Score and Plan: 3 and Ondansetron, Dexamethasone and Treatment may vary due to age or medical condition  Airway Management Planned: Oral ETT  Additional Equipment: Arterial line  Intra-op Plan:   Post-operative Plan: Extubation in OR  Informed Consent: I have reviewed the patients History and Physical, chart, labs and discussed the procedure including the risks, benefits and alternatives for the proposed anesthesia with the patient or authorized representative who has indicated his/her understanding and acceptance.       Plan Discussed with: CRNA, Anesthesiologist and Surgeon  Anesthesia Plan Comments:         Anesthesia Quick Evaluation

## 2020-03-08 NOTE — Transfer of Care (Signed)
Immediate Anesthesia Transfer of Care Note  Patient: [place] Record  Procedure(s) Performed: IR WITH ANESTHESIA (N/A )  Patient Location: ICU  Anesthesia Type:General  Level of Consciousness: Patient remains intubated per anesthesia plan  Airway & Oxygen Therapy: Patient remains intubated per anesthesia plan and Patient placed on Ventilator (see vital sign flow sheet for setting)  Post-op Assessment: Report given to RN and Post -op Vital signs reviewed and stable  Post vital signs: Reviewed and stable  Last Vitals:  Vitals Value Taken Time  BP 127/72 03/08/20 1047  Temp    Pulse 87 03/08/20 1054  Resp 18 03/08/20 1054  SpO2 100 % 03/08/20 1054  Vitals shown include unvalidated device data.  Last Pain: There were no vitals filed for this visit.       Complications: No complications documented.

## 2020-03-08 NOTE — Progress Notes (Signed)
Patient ID: Deborah [name], female   DOB: November 27, 1970, 49 y.o.   MRN: [id number] INR. History obtained from the chart. 49 Y RT H  F   LSW 8 PM LN MRSS of 0. Found with rt sised ,mute and with Lt gaze deviation. CT Brain No ICH . CTA occluded LT MCA M 1 seg. ASPECTS 6. CTP at [facility]. Core of 63 ml versus Tmax> 6s of 95 ml and penumbra of 32 ml. Dr Otelia Limes had spoken tio the spouse regarding patients medical condition and probable Lt MCA endovascular revascularization . Reasons,procedure and alternatives were reviewed . Risk of ICH of 10 to 15 %,worsening neuro function and death were discussed . Spouse provided witnessed informed consent to the treatment.. Prior to the procedure attempts were made to speak with the spouse without success on the 2 telephone numbers provided in the patients chart. S.Deveshwatr MD

## 2020-03-08 NOTE — Progress Notes (Signed)
Arterial line without good wave form since 2145 despite troubleshooting. Going by cuff pressure. RN will continue to monitor.

## 2020-03-09 ENCOUNTER — Inpatient Hospital Stay (HOSPITAL_COMMUNITY): Payer: Medicaid Other

## 2020-03-09 ENCOUNTER — Inpatient Hospital Stay: Payer: Self-pay

## 2020-03-09 ENCOUNTER — Encounter (HOSPITAL_COMMUNITY): Payer: Self-pay | Admitting: Interventional Radiology

## 2020-03-09 DIAGNOSIS — I63512 Cerebral infarction due to unspecified occlusion or stenosis of left middle cerebral artery: Secondary | ICD-10-CM

## 2020-03-09 DIAGNOSIS — I724 Aneurysm of artery of lower extremity: Secondary | ICD-10-CM

## 2020-03-09 DIAGNOSIS — I63319 Cerebral infarction due to thrombosis of unspecified middle cerebral artery: Secondary | ICD-10-CM | POA: Diagnosis not present

## 2020-03-09 DIAGNOSIS — Z978 Presence of other specified devices: Secondary | ICD-10-CM

## 2020-03-09 DIAGNOSIS — E782 Mixed hyperlipidemia: Secondary | ICD-10-CM

## 2020-03-09 DIAGNOSIS — D72829 Elevated white blood cell count, unspecified: Secondary | ICD-10-CM

## 2020-03-09 DIAGNOSIS — F17200 Nicotine dependence, unspecified, uncomplicated: Secondary | ICD-10-CM

## 2020-03-09 DIAGNOSIS — E1165 Type 2 diabetes mellitus with hyperglycemia: Secondary | ICD-10-CM

## 2020-03-09 DIAGNOSIS — I6602 Occlusion and stenosis of left middle cerebral artery: Secondary | ICD-10-CM | POA: Diagnosis not present

## 2020-03-09 LAB — CBC WITH DIFFERENTIAL/PLATELET
Abs Immature Granulocytes: 0.15 10*3/uL — ABNORMAL HIGH (ref 0.00–0.07)
Basophils Absolute: 0.1 10*3/uL (ref 0.0–0.1)
Basophils Relative: 1 %
Eosinophils Absolute: 0 10*3/uL (ref 0.0–0.5)
Eosinophils Relative: 0 %
HCT: 37.3 % (ref 36.0–46.0)
Hemoglobin: 12.1 g/dL (ref 12.0–15.0)
Immature Granulocytes: 1 %
Lymphocytes Relative: 13 %
Lymphs Abs: 2.4 10*3/uL (ref 0.7–4.0)
MCH: 32.4 pg (ref 26.0–34.0)
MCHC: 32.4 g/dL (ref 30.0–36.0)
MCV: 100 fL (ref 80.0–100.0)
Monocytes Absolute: 1.4 10*3/uL — ABNORMAL HIGH (ref 0.1–1.0)
Monocytes Relative: 7 %
Neutro Abs: 14.7 10*3/uL — ABNORMAL HIGH (ref 1.7–7.7)
Neutrophils Relative %: 78 %
Platelets: 284 10*3/uL (ref 150–400)
RBC: 3.73 MIL/uL — ABNORMAL LOW (ref 3.87–5.11)
RDW: 14.4 % (ref 11.5–15.5)
WBC: 18.7 10*3/uL — ABNORMAL HIGH (ref 4.0–10.5)
nRBC: 0 % (ref 0.0–0.2)

## 2020-03-09 LAB — ECHOCARDIOGRAM COMPLETE
Area-P 1/2: 4.06 cm2
Height: 60 in
S' Lateral: 2.7 cm
Weight: 2885.38 oz

## 2020-03-09 LAB — LIPID PANEL
Cholesterol: 288 mg/dL — ABNORMAL HIGH (ref 0–200)
HDL: 40 mg/dL — ABNORMAL LOW (ref >40)
LDL Cholesterol: 174 mg/dL — ABNORMAL HIGH (ref 0–99)
Total CHOL/HDL Ratio: 7.2 RATIO
Triglycerides: 371 mg/dL — ABNORMAL HIGH (ref <150)
VLDL: 74 mg/dL — ABNORMAL HIGH (ref 0–40)

## 2020-03-09 LAB — GLUCOSE, CAPILLARY
Glucose-Capillary: 266 mg/dL — ABNORMAL HIGH (ref 70–99)
Glucose-Capillary: 168 mg/dL — ABNORMAL HIGH (ref 70–99)
Glucose-Capillary: 225 mg/dL — ABNORMAL HIGH (ref 70–99)
Glucose-Capillary: 244 mg/dL — ABNORMAL HIGH (ref 70–99)

## 2020-03-09 LAB — BASIC METABOLIC PANEL
Anion gap: 13 (ref 5–15)
BUN: 10 mg/dL (ref 6–20)
CO2: 17 mmol/L — ABNORMAL LOW (ref 22–32)
Calcium: 8.1 mg/dL — ABNORMAL LOW (ref 8.9–10.3)
Chloride: 115 mmol/L — ABNORMAL HIGH (ref 98–111)
Creatinine, Ser: 1.02 mg/dL — ABNORMAL HIGH (ref 0.44–1.00)
GFR calc Af Amer: >60 mL/min (ref >60)
GFR calc non Af Amer: >60 mL/min (ref >60)
Glucose, Bld: 201 mg/dL — ABNORMAL HIGH (ref 70–99)
Potassium: 4.7 mmol/L (ref 3.5–5.1)
Sodium: 145 mmol/L (ref 135–145)

## 2020-03-09 LAB — RAPID URINE DRUG SCREEN, HOSP PERFORMED
Amphetamines: NOT DETECTED
Barbiturates: POSITIVE — AB
Benzodiazepines: POSITIVE — AB
Cocaine: NOT DETECTED
Opiates: NOT DETECTED
Tetrahydrocannabinol: NOT DETECTED

## 2020-03-09 LAB — SODIUM
Sodium: 144 mmol/L (ref 135–145)
Sodium: 145 mmol/L (ref 135–145)
Sodium: 149 mmol/L — ABNORMAL HIGH (ref 135–145)

## 2020-03-09 LAB — HEMOGLOBIN A1C
Hgb A1c MFr Bld: 10.7 % — ABNORMAL HIGH (ref 4.8–5.6)
Mean Plasma Glucose: 260.39 mg/dL

## 2020-03-09 MED ORDER — ATORVASTATIN CALCIUM 80 MG PO TABS
80.0000 mg | ORAL_TABLET | Freq: Every day | ORAL | Status: DC
  Administered 2020-03-09 – 2020-03-30 (×22): 80 mg
  Filled 2020-03-09 (×23): qty 1

## 2020-03-09 MED ORDER — SODIUM CHLORIDE 0.9% FLUSH
10.0000 mL | Freq: Two times a day (BID) | INTRAVENOUS | Status: DC
  Administered 2020-03-09: 10 mL
  Administered 2020-03-09 – 2020-03-10 (×2): 20 mL
  Administered 2020-03-10: 10 mL
  Administered 2020-03-11: 20 mL
  Administered 2020-03-11: 10 mL
  Administered 2020-03-12: 20 mL
  Administered 2020-03-13: 40 mL
  Administered 2020-03-13: 10 mL
  Administered 2020-03-14: 20 mL
  Administered 2020-03-14 – 2020-03-16 (×4): 10 mL

## 2020-03-09 MED ORDER — SODIUM CHLORIDE 0.9% FLUSH: 10.0000 mL | INTRAVENOUS | Status: DC | PRN

## 2020-03-09 MED ORDER — PANTOPRAZOLE SODIUM 40 MG PO PACK
40.0000 mg | PACK | Freq: Every day | ORAL | Status: DC
  Administered 2020-03-09 – 2020-03-16 (×8): 40 mg
  Filled 2020-03-09 (×8): qty 20

## 2020-03-09 NOTE — Progress Notes (Signed)
RT transported patient from 4N15 to CT and back with RN. No complications. RT will continue to monitor.

## 2020-03-09 NOTE — Progress Notes (Signed)
S: Repeat CT head completed.   O: BP 135/74   Pulse 83   Temp 99.3 F (37.4 C)   Resp 15   Ht 5' (1.524 m)   Wt 81.8 kg   SpO2 100%   BMI 35.22 kg/m   Neuro exam: Ment: Sedated on fentanyl and 15 mcg/kg/min of propofol. Will open eyes to voice and noxious stimuli CN: Pupils constrict equally to 3 mm with penlight, right more sluggishly than the left Motor/Sensory: RUE flaccid with no response to noxious. RUE drops immediately back to bed when passively raised, with no resistance to gravity RLE with increased extensor tone. No withdrawal to noxious.  LUE with some tone, will drop back to bed in < 2 seconds when passively raised. LLE with slight movement to noxious Reflexes: Asymmetric. Right toe upgoing, left toe downgoing.   A/R: 49 year old female with large acute left MCA ischemic infarction. She is S/P Lt common carotiod arteriogram on Tuesday followed by revascularization of occluded Lt MCA M 1 seg with x pass with aspiration,x 1 pass with tiger17  retrievera and x 1 pass with solitaire 33mm x 20 mm retriver and aspiration achieving a TICI 2C revascularization. 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 Rescue stent not performed due to the  significantly  increased risk of ICH  1. Repeat CT head at 0102 reveaks worsening mass effect, with increased midline shift. There is some hemorrhage in the bed of the infarction.  2. Hypertonic saline has not stopped progression of the edema associated with the large left MCA stroke.  3. May need emergent hemicraniectomy. Neurosurgery has been contacted. Per Neurosurgery, obtain repeat CT head at 0630 to reassess as risk/benefits favor observation for possible control of the swelling with hypertonic saline, which has been increased to 75 cc/hr, over immediate craniectomy given that the left lateral ventricle, although compressed, is still patent.   4. Continue frequent neuro checks 5. Repeat CT head at  0630 (ordered)  35 minutes spent in the emergent neurological evaluation and management of this critically ill patient. Time spent included coordination of care.   Electronically signed: Dr. Caryl Pina

## 2020-03-09 NOTE — Progress Notes (Signed)
Referring Physician(s): Code stroke- Bhagat, Srishti L (neurology)  Supervising Physician: Julieanne Cottoneveshwar, Sanjeev  Patient Status:  [facility]  Chief Complaint: None- intubated with sedation  Subjective:  History of acute CVA s/p cerebral arteriogram with emergent mechanical thrombectomy of left MCA M1 occlusion achieving a TICI 2c revascularization via right femoral approach 03/08/2020 by Dr. Corliss Skainseveshwar. Patient laying in bed intubated with sedation. She opens eyes to voice but does not follow simple commands. Left side moves spontaneously, RLE withdraws from pain, no movements of RUE. Left gaze preference.  CT head this AM at approximately 0151: 1. Continued interval evolution of large left MCA territory infarct, overall similar in size and distribution from previous. Associated regional mass effect with partial effacement of the left lateral ventricle has mildly worsened, with worsened left-to-right midline shift now measuring up to 5 mm. 2. Scattered foci of superimposed parenchymal hemorrhage within the area of infarction, stable to slightly decreased from previous. A portion of the apparent improvement in this finding may reflect interval clearing of contrast staining. 3. Persistent hyperdensity within the left M1 segment and proximal left MCA branches, consistent with reocclusion/thrombus. 4. No other new acute intracranial abnormality.  CT head this AM at approximately  1. Unchanged extent of a large acute/early subacute left MCA vascular territory infarct and associated hemorrhage as compared to the head CT performed earlier the same day at 12:57 a.m. 2. Continued slight interval increase in mass effect with partial effacement of the left lateral ventricle and now 6 mm rightward midline shift (previously 5 mm). The basal cisterns remain patent. No evidence of ventricular entrapment. 3. Persistent hyperdensity within the M1 left middle cerebral artery and proximal left MCA branches  consistent with known reocclusion/thrombus.   Allergies: Aspirin and Mushroom extract complex  Medications: Prior to Admission medications   Medication Sig Start Date End Date Taking? Authorizing Provider  acetaminophen (TYLENOL) 325 MG tablet Take 325 mg by mouth every 6 (six) hours as needed.   Yes [provider]  albuterol (VENTOLIN HFA) 108 (90 Base) MCG/ACT inhaler Inhale 2 puffs into the lungs every 6 (six) hours as needed for wheezing or shortness of breath.    Yes [provider]  ALPRAZolam Prudy Feeler(XANAX) 0.5 MG tablet Take 0.5 mg by mouth 3 (three) times daily as needed for anxiety. 1 tablet by mouth TID PRN 06/20/16  Yes [provider]  atorvastatin (LIPITOR) 20 MG tablet Take 20 mg by mouth daily. 04/18/16  Yes [provider]  Butalbital-APAP-Caffeine 50-325-40 MG capsule Take 1 capsule by mouth 2 (two) times daily as needed for headache.  04/18/16  Yes [provider]  cyclobenzaprine (FLEXERIL) 5 MG tablet Take 5 mg by mouth 3 (three) times daily.  06/20/16  Yes [provider]  diphenhydrAMINE (BENADRYL) 25 mg capsule Take 25 mg by mouth daily.   Yes [provider]  glimepiride (AMARYL) 2 MG tablet Take 2 mg by mouth daily with breakfast.   Yes [provider]  insulin glargine (LANTUS SOLOSTAR) 100 UNIT/ML Solostar Pen Inject 28 Units into the skin daily.  07/10/16  Yes [provider]  liraglutide (VICTOZA) 18 MG/3ML SOPN Inject 1.8 mg into the skin daily. 1.8 daily     Yes [provider]  Phenyleph-Doxylamine-DM-APAP (NYQUIL SEVERE COLD/FLU) 5-6.25-10-325 MG/15ML LIQD Take 15 mLs by mouth every 4 (four) hours as needed (cough).   Yes [provider]  promethazine (PHENERGAN) 25 MG tablet Take 25 mg by mouth 3 (three) times daily  as needed for nausea (dizziness).  06/28/16  Yes [provider]  traMADol (ULTRAM) 50 MG tablet Take 50-100 mg by mouth every 8 (eight) hours as needed  for moderate pain.  07/10/16  Yes [provider]  clopidogrel (PLAVIX) 75 MG tablet Take 1 tablet (75 mg total) by mouth daily. 10/29/19   Georgeanna Lea, MD  Erenumab-aooe 70 MG/ML SOAJ Inject into the skin. Once Monthly Patient not taking: Reported on 03/08/2020    [provider]  glucose blood (ACCU-CHEK AVIVA PLUS) test strip USE 1 STRIP UTD 07/08/16   [provider]  Insulin Pen Needle (NOVOFINE) 32G X 6 MM MISC USE BID 07/04/16   [provider]  Lancets (ACCU-CHEK MULTICLIX) lancets U TID 07/03/16   [provider]  levofloxacin (LEVAQUIN) 500 MG tablet Take by mouth. Patient not taking: Reported on 03/08/2020 08/27/16   [provider]     Vital Signs: BP 130/62   Pulse 93   Temp 99.9 F (37.7 C)   Resp (!) 25   Ht 5' (1.524 m)   Wt 180 lb 5.4 oz (81.8 kg)   SpO2 99%   BMI 35.22 kg/m   Physical Exam Vitals and nursing note reviewed.  Constitutional:      General: She is not in acute distress.    Comments: Intubated with sedation.  Pulmonary:     Effort: Pulmonary effort is normal. No respiratory distress.     Comments: Intubated with sedation. Skin:    General: Skin is warm and dry.     Comments: Right femoral puncture site with palpable firmness (this was marked by Dr. Corliss Skains) and moderate tenderness, no active bleeding noted.  Neurological:     Comments: Intubated with sedation. She opens eyes to voice but does not follow simple commands. PERRL bilaterally, 2 mm bilaterally. Left gaze preference unable to cross midline. Left side moves spontaneously, RLE withdraws from pain, no movements of RUE. Distal pulses (DPs) 1+ bilaterally.     Imaging: CT HEAD WO CONTRAST  Result Date: 03/09/2020 CLINICAL DATA:  Parenchymal hemorrhage, follow-up. EXAM: CT HEAD WITHOUT CONTRAST TECHNIQUE: Contiguous axial images were obtained from the base of the skull through the vertex without intravenous contrast. COMPARISON:   Prior head CT examinations 03/09/2020 at 12:58 a.m. and earlier. FINDINGS: Brain: Continued interval evolution of cytotoxic edema throughout much of the left MCA vascular territory consistent with acute/early subacute infarction, not significantly changed in extent as compared to the most recent prior head CT. Continued slight interval increase in mass effect with partial effacement of the left lateral ventricle and now with 6 mm rightward midline shift (previously 5 mm). The basal cisterns remain patent. No evidence of ventricular entrapment. Unchanged small foci of parenchymal hemorrhage within the left basal ganglia. Unchanged appearance of additional scattered hemorrhage at the level of the left sylvian fissure and left frontal operculum. No interval hemorrhage is identified. No new large vessel territory infarct. Vascular: Redemonstrated hyperdensity within the M1 left MCA and proximal left M2 branches consistent with known reocclusion/thrombus. Skull: Normal. Negative for fracture or focal lesion. Sinuses/Orbits: Visualized orbits show no acute finding. Moderate ethmoid sinus mucosal thickening small amount of frothy secretions within the bilateral sphenoid sinuses. No significant mastoid effusion. IMPRESSION: Unchanged extent of a large acute/early subacute left MCA vascular territory infarct and associated hemorrhage as compared to the head CT performed earlier the same day at 12:57 a.m. Continued slight interval increase in mass effect with partial effacement of the left  lateral ventricle and now 6 mm rightward midline shift (previously 5 mm). The basal cisterns remain patent. No evidence of ventricular entrapment. Persistent hyperdensity within the M1 left middle cerebral artery and proximal left MCA branches consistent with known reocclusion/thrombus. Electronically Signed   By: Jackey Loge DO   On: 03/09/2020 08:27   CT HEAD WO CONTRAST  Result Date: 03/09/2020 CLINICAL DATA:  Follow-up examination  for parenchymal hemorrhage. EXAM: CT HEAD WITHOUT CONTRAST TECHNIQUE: Contiguous axial images were obtained from the base of the skull through the vertex without intravenous contrast. COMPARISON:  Prior CT from 03/08/2020. FINDINGS: Brain: Continued interval evolution of cytotoxic edema throughout much of the left MCA territory, overall similar in size and distribution from previous. Scattered foci of superimposed parenchymal hemorrhage at the left lentiform nucleus are relatively unchanged. Additional scattered hemorrhage at the level of the left sylvian fissure and operculum has somewhat dispersed since previous, now less defined and less dense as compared to previous. No definite new hemorrhage. Regional mass effect with partial effacement of the left lateral ventricle has mildly worsened. Associated left-to-right midline shift now measures up to 5 mm. No other new large vessel territory infarct. No extra-axial fluid collection. Vascular: Persistent hyperdensity within the left M1 segment and proximal left MCA branches, consistent with reocclusion/thrombus. Skull: Scalp soft tissues within normal limits.  Calvarium intact. Sinuses/Orbits: Globes and orbital soft tissues demonstrate no acute finding. Scattered mucosal thickening noted within the ethmoidal air cells. Paranasal sinuses are otherwise clear. No mastoid effusion. Other: None. IMPRESSION: 1. Continued interval evolution of large left MCA territory infarct, overall similar in size and distribution from previous. Associated regional mass effect with partial effacement of the left lateral ventricle has mildly worsened, with worsened left-to-right midline shift now measuring up to 5 mm. 2. Scattered foci of superimposed parenchymal hemorrhage within the area of infarction, stable to slightly decreased from previous. A portion of the apparent improvement in this finding may reflect interval clearing of contrast staining. 3. Persistent hyperdensity within the  left M1 segment and proximal left MCA branches, consistent with reocclusion/thrombus. 4. No other new acute intracranial abnormality. Electronically Signed   By: Rise Mu M.D.   On: 03/09/2020 01:51   CT HEAD WO CONTRAST  Result Date: 03/08/2020 CLINICAL DATA:  Stroke, follow-up. EXAM: CT HEAD WITHOUT CONTRAST TECHNIQUE: Contiguous axial images were obtained from the base of the skull through the vertex without intravenous contrast. COMPARISON:  Noncontrast head CT examine age performed earlier the same day 03/08/2020 at 7:57 a.m. and 10:26 a.m., CT angiogram head/neck 03/08/2020, neuro interventional procedural note from earlier the same day. FINDINGS: Brain: As compared to the head CT performed earlier the same day at 7:57 a.m., there has been significant interval progression of acute ischemic infarction changes within the left MCA vascular territory affecting portions of the left frontal lobe, parietal lobe, temporal lobe and left insula. The infarct is now large. Small foci of parenchymal hemorrhage within the left basal ganglia do not appear significantly changed from the head CT performed earlier the same day at 10:26 a.m. However, slightly increased from this prior examination, there has been a slight interval increase in a small to moderate amount of hemorrhage and/or contrast within the left perisylvian region and more superiorly along the left cerebral convexity. There is increased mass effect with increased partial effacement of the left lateral ventricle and now 4 mm rightward midline shift. Vascular: Residual circulating contrast limits evaluation for abnormal vessel hyperdensity Skull: Normal. Negative for fracture  or focal lesion. Sinuses/Orbits: Visualized orbits show no acute finding. Mild ethmoid sinus mucosal thickening. Small right ethmoid sinus osteoma. No significant mastoid effusion. These results will be called to the ordering clinician or representative by the Radiologist  Assistant, and communication documented in the PACS or [employer]. IMPRESSION: Significantly increased acute ischemic infarction changes within the left MCA vascular territory as compared to the head CT performed earlier the same day at 7:57 a.m. The left MCA infarct is now large. Increased mass effect and partial effacement of the left lateral ventricle now with 4 mm rightward midline shift. Small foci of parenchymal hemorrhage within the left basal ganglia do not appear significantly changed from the head CT performed earlier the same day at 10:26 a.m. However, small to moderate volume hemorrhage and/or contrast within the left perisylvian region and more superiorly along the left cerebral convexity has slightly increased since this prior study. Electronically Signed   By: Jackey Loge DO   On: 03/08/2020 18:18   CT HEAD WO CONTRAST  Result Date: 03/08/2020 CLINICAL DATA:  Referral for stroke treatment. EXAM: CT HEAD WITHOUT CONTRAST TECHNIQUE: Contiguous axial images were obtained from the base of the skull through the vertex without intravenous contrast. COMPARISON:  Head CT earlier today around the hospital FINDINGS: Brain: Cytotoxic edema seen in a matching distribution to prior, present at the putamen, insula, ganglionic cortex level, and supra ganglionic left frontal parietal cortex. No hemorrhagic conversion. There is still intravenous and dural contrast from preceding CTA. Vascular: Stable Skull: Negative Sinuses/Orbits: Negative Other: These results were called by telephone at the time of interpretation on 03/08/2020 at 8:49 am to provider [facility] , who verbally acknowledged these results. IMPRESSION: Acute left MCA territory infarct without hemorrhagic conversion or progression from CT earlier today. Aspects remains 6. Electronically Signed   By: Marnee Spring M.D.   On: 03/08/2020 08:49   CT Code Stroke Cerebral Perfusion with contrast  Result Date: 03/08/2020 CLINICAL DATA:   Stroke. EXAM: CT PERFUSION BRAIN TECHNIQUE: Multiphase CT imaging of the brain was performed following IV bolus contrast injection. Subsequent parametric perfusion maps were calculated using RAPID software. CONTRAST:  32mL OMNIPAQUE IOHEXOL 350 MG/ML SOLN COMPARISON:  CT and CTA from earlier today. FINDINGS: CT Brain Perfusion Findings: CBF (<30%) Volume: 90mL Perfusion (Tmax>6.0s) volume: 70mL Mismatch Volume: 78mL ASPECTS on noncontrast CT Head: 6 at 545 today. The left MCA territory infarct location has good general agreement with a noncontrast head CT, although is more extensive at the level of the lateral frontal lobe. IMPRESSION: 1. 63 cc core infarct in the left cerebral hemisphere with good general agreement between the CT perfusion and preceding noncontrast CT. 2. 32 cc of estimated penumbra elsewhere in the left MCA territory. Electronically Signed   By: Marnee Spring M.D.   On: 03/08/2020 08:26   DG Chest Port 1 View  Result Date: 03/09/2020 CLINICAL DATA:  Hypoxia EXAM: PORTABLE CHEST 1 VIEW COMPARISON:  March 08, 2020 FINDINGS: Endotracheal tube tip is at the origin of the right main bronchus. Nasogastric tube tip and side port in stomach. No pneumothorax. There is atelectatic change in the left base. Lungs otherwise are clear. Heart is upper normal in size with pulmonary vascularity normal. No adenopathy. No bone lesions. IMPRESSION: Endotracheal tube tip in proximal right main bronchus. Advise withdrawing endotracheal tube approximately 3.5 cm. Atelectatic change left base. Lungs otherwise clear. No pneumothorax. Stable cardiac silhouette. Critical Value/emergent results were called by telephone at the time of interpretation  on 03/09/2020 at 8:01 am to provider Philbert Riser, RN, who verbally acknowledged these results. Electronically Signed   By: Bretta Bang III M.D.   On: 03/09/2020 08:01   DG Abd Portable 1V  Result Date: 03/09/2020 CLINICAL DATA:  Nasogastric tube placement  EXAM: PORTABLE ABDOMEN - 1 VIEW COMPARISON:  None. FINDINGS: Nasogastric tube tip and side port are in the stomach. There is no bowel dilatation or air-fluid level to suggest bowel obstruction. No free air. Surgical clips noted in lower abdomen slightly to the left of midline. IMPRESSION: Nasogastric tube tip and side port in stomach. No bowel obstruction or free air evident on supine examination. Electronically Signed   By: Bretta Bang III M.D.   On: 03/09/2020 07:59   [place] EKG SITE RITE  Result Date: 03/09/2020 If Site Rite image not attached, placement could not be confirmed due to current cardiac rhythm.   Labs:  CBC: Recent Labs    03/08/20 0914 03/08/20 1151 03/08/20 1523 03/08/20 1544  WBC  --  24.2*  --   --   HGB 14.6 14.5 14.3 14.3  HCT 43.0 42.9 42.0 42.0  PLT  --  383  --   --     COAGS: Recent Labs    03/08/20 1151  INR 1.1  APTT 25    BMP: Recent Labs    03/08/20 0914 03/08/20 0914 03/08/20 1151 03/08/20 1151 03/08/20 1523 03/08/20 1533 03/08/20 1544 03/08/20 2010  NA 130*   < > 135   < > 138 136 139 133*  K 5.3*   < > 4.0  --  3.8 4.0 3.9  --   CL 101  --  102  --   --  103  --   --   CO2  --   --  21*  --   --  20*  --   --   GLUCOSE 413*  --  251*  --   --  174*  --   --   BUN 16  --  13  --   --  13  --   --   CALCIUM  --   --  8.8*  --   --  8.7*  --   --   CREATININE 0.80  --  1.11*  --   --  1.02*  --   --   GFRNONAA  --   --  59*  --   --  >60  --   --   GFRAA  --   --  >60  --   --  >60  --   --    < > = values in this interval not displayed.    LIVER FUNCTION TESTS: Recent Labs    03/08/20 1151  BILITOT 0.4  AST 24  ALT 30  ALKPHOS 169*  PROT 7.1  ALBUMIN 3.4*    Assessment and Plan:  History of acute CVA s/p cerebral arteriogram with emergent mechanical thrombectomy of left MCA M1 occlusion achieving a TICI 2c revascularization via right femoral approach 03/08/2020 by Dr. Corliss Skains. Patient's condition stable- remains  intubated/sedated, opens eyes to voice but does not follow simple commands, left side moves spontaneously, RLE withdraws from pain, no movements of RUE. Right femoral puncture site with palpable firmness, distal pulses (DPs) 1+ bilaterally- will obtain vascular US to evaluate right groin. Further plans per neurology/CCM/neurosurgery- appreciate and agree with management. NIR to follow.   Electronically Signed: Elwin Mocha, PA-C 03/09/2020, 8:46 AM  I spent a total of 25 Minutes at the the patient's bedside AND on the patient's hospital floor or unit, greater than 50% of which was counseling/coordinating care for CVA s/p revascularization.

## 2020-03-09 NOTE — Anesthesia Postprocedure Evaluation (Signed)
Anesthesia Post Note  Patient: Deborah [name]  Procedure(s) Performed: IR WITH ANESTHESIA (N/A )     Patient location during evaluation: SICU Anesthesia Type: General Level of consciousness: sedated Pain management: pain level controlled Vital Signs Assessment: post-procedure vital signs reviewed and stable Respiratory status: patient remains intubated per anesthesia plan Cardiovascular status: stable Postop Assessment: no apparent nausea or vomiting Anesthetic complications: no   No complications documented.  Last Vitals:  Vitals:   03/09/20 1745 03/09/20 1800  BP: (!) 115/53 133/60  Pulse: 67 66  Resp: 16 15  Temp: 37.5 C 37.4 C  SpO2: 100% 99%    Last Pain:  Vitals:   03/09/20 1715  TempSrc: Bladder                 Josie Mesa S

## 2020-03-09 NOTE — Progress Notes (Signed)
Peripherally Inserted Central Catheter Placement  The IV Nurse has discussed with the patient and/or persons authorized to consent for the patient, the purpose of this procedure and the potential benefits and risks involved with this procedure.  The benefits include less needle sticks, lab draws from the catheter, and the patient may be discharged home with the catheter. Risks include, but not limited to, infection, bleeding, blood clot (thrombus formation), and puncture of an artery; nerve damage and irregular heartbeat and possibility to perform a PICC exchange if needed/ordered by physician.  Alternatives to this procedure were also discussed.  Bard Power PICC patient education guide, fact sheet on infection prevention and patient information card has been provided to patient /or left at bedside.    PICC Placement Documentation  PICC Double Lumen 03/09/20 PICC Left Basilic 38 cm 0 cm (Active)  Indication for Insertion or Continuance of Line Vasoactive infusions 03/09/20 1234  Exposed Catheter (cm) 0 cm 03/09/20 1234  Site Assessment Clean;Dry;Intact 03/09/20 1234  Lumen #1 Status Flushed;Blood return noted;Saline locked 03/09/20 1234  Lumen #2 Status Blood return noted;Flushed;Saline locked 03/09/20 1234  Dressing Type Transparent;Securing device 03/09/20 1234  Dressing Status Clean;Dry;Intact 03/09/20 1234  Antimicrobial disc in place? Yes 03/09/20 1234  Dressing Change Due 03/16/20 03/09/20 1234       Deborah [name] 03/09/2020, 12:37 PM

## 2020-03-09 NOTE — Progress Notes (Signed)
OT Cancellation Note  Patient Details Name: Letta Cargile MRN: [id number] DOB: 21-Apr-1971   Cancelled Treatment:    Reason Eval/Treat Not Completed: Medical issues which prohibited therapy;Patient at procedure or test/ unavailable; pt receiving PICC this AM and then to go for MRI, per chart and RN report pt with increased swelling noted on recent CTs. Will follow up for OT eval as able/as pt is appropriate.   Marcy Siren, OT Acute Rehabilitation Services Pager [phone] Office [phone]   Orlando Penner 03/09/2020, 1:29 PM

## 2020-03-09 NOTE — Progress Notes (Signed)
SLP Cancellation Note  Patient Details Name: Deborah [name] MRN: [id number] DOB: 04/06/1971   Cancelled treatment:       Reason Eval/Treat Not Completed: Medical issues which prohibited therapy. Pt on ventilator, sedated. Will f/u as able.   Mahala Menghini., M.A. CCC-SLP Acute Rehabilitation Services Pager [phone] Office [phone]  03/09/2020, 7:54 AM

## 2020-03-09 NOTE — Progress Notes (Signed)
This RN sent down a light green tube for the 0200 sodium check. It was ran as a lipid panel. Lab was called and asked to do an add-on with the sodium. Lab stated that the Sodium orders had been canceled d/t overlapping with the BMP orders. Lab just called because specimen had hemolyzed and they were unable to get a Sodium. Pt is now a lab draw; Sodium will be collected as soon as lab is able to come.

## 2020-03-09 NOTE — Progress Notes (Signed)
RT transported patient from 4N15 to MRI and back with RN without complications. RT will continue to monitor.

## 2020-03-09 NOTE — Progress Notes (Signed)
Limited right lower extremity arterial duplex completed. Refer to "CV Proc" under chart review to view preliminary results.  Preliminary results and patient encounter discussed with Marchelle Folks, RN.  03/09/2020 9:54 AM Eula Fried., MHA, RVT, RDCS, RDMS

## 2020-03-09 NOTE — Consult Note (Signed)
Reason for consult: large L MCA stroke  HPI:     Patient is a 49 y.o. female with poorly controlled IDDM suffered a left MCA stroke yesterday morning.  Revascularization was unsuccessful and small extravasation noted.  She remained intubated post-procedure.  Interval CT performed early this morning showed increased swelling for which neurosurgery was consulted.  Sodium was 133.    Patient Active Problem List   Diagnosis Date Noted  . Acute ischemic left MCA stroke (HCC) 03/08/2020  . Middle cerebral artery embolism, left 03/08/2020  . Hypertension   . Dyslipidemia 12/10/2019  . Syncope and collapse 10/29/2019  . Atypical chest pain 10/29/2019  . Smoking 10/29/2019  . Anxiety 10/28/2019  . Diabetes mellitus (HCC) 10/28/2019  . Migraines 10/28/2019  . Rhinitis, allergic 10/28/2019  . Shortness of breath 10/28/2019  . Chronic pyelonephritis 09/03/2016  . Atrophic kidney 07/19/2016  . Unspecified abdominal pain 07/19/2016  . PONV (postoperative nausea and vomiting) 06/18/2005   Past Medical History:  Diagnosis Date  . DM (diabetes mellitus), type 2 (HCC)   . Hyperlipidemia   . Hypersomnia   . Sprain of right ankle, initial encounter   . Syncope       Medications Prior to Admission  Medication Sig Dispense Refill Last Dose  . acetaminophen (TYLENOL) 325 MG tablet Take 325 mg by mouth every 6 (six) hours as needed.   unk  . albuterol (VENTOLIN HFA) 108 (90 Base) MCG/ACT inhaler Inhale 2 puffs into the lungs every 6 (six) hours as needed for wheezing or shortness of breath.    unk  . ALPRAZolam (XANAX) 0.5 MG tablet Take 0.5 mg by mouth 3 (three) times daily as needed for anxiety. 1 tablet by mouth TID PRN   Past Week at Unknown time  . atorvastatin (LIPITOR) 20 MG tablet Take 20 mg by mouth daily.   03/07/2020 at Unknown time  . Butalbital-APAP-Caffeine 50-325-40 MG capsule Take 1 capsule by mouth 2 (two) times daily as needed for headache.    03/04/2020  . cyclobenzaprine  (FLEXERIL) 5 MG tablet Take 5 mg by mouth 3 (three) times daily.    Past Week at Unknown time  . diphenhydrAMINE (BENADRYL) 25 mg capsule Take 25 mg by mouth daily.   03/07/2020 at Unknown time  . glimepiride (AMARYL) 2 MG tablet Take 2 mg by mouth daily with breakfast.   03/07/2020 at Unknown time  . insulin glargine (LANTUS SOLOSTAR) 100 UNIT/ML Solostar Pen Inject 28 Units into the skin daily.    03/07/2020 at Unknown time  . liraglutide (VICTOZA) 18 MG/3ML SOPN Inject 1.8 mg into the skin daily. 1.8 daily     03/07/2020 at Unknown time  . Phenyleph-Doxylamine-DM-APAP (NYQUIL SEVERE COLD/FLU) 5-6.25-10-325 MG/15ML LIQD Take 15 mLs by mouth every 4 (four) hours as needed (cough).   Past Week at Unknown time  . promethazine (PHENERGAN) 25 MG tablet Take 25 mg by mouth 3 (three) times daily as needed for nausea (dizziness).    Past Week at Unknown time  . traMADol (ULTRAM) 50 MG tablet Take 50-100 mg by mouth every 8 (eight) hours as needed for moderate pain.    03/07/2020  . clopidogrel (PLAVIX) 75 MG tablet Take 1 tablet (75 mg total) by mouth daily. 90 tablet 2 unk  . Erenumab-aooe 70 MG/ML SOAJ Inject into the skin. Once Monthly (Patient not taking: Reported on 03/08/2020)   Not Taking at Unknown time  . glucose blood (ACCU-CHEK AVIVA PLUS) test strip USE 1 STRIP UTD     .  Insulin Pen Needle (NOVOFINE) 32G X 6 MM MISC USE BID     . Lancets (ACCU-CHEK MULTICLIX) lancets U TID     . levofloxacin (LEVAQUIN) 500 MG tablet Take by mouth. (Patient not taking: Reported on 03/08/2020)   Not Taking at Unknown time   Allergies  Allergen Reactions  . Aspirin Shortness Of Breath  . Mushroom Extract Complex Swelling    Social History   Tobacco Use  . Smoking status: Current Every Day Smoker    Packs/day: 0.50    Types: Cigarettes  . Smokeless tobacco: Never Used  Substance Use Topics  . Alcohol use: Not Currently    Family History  Problem Relation Age of Onset  . Heart defect Mother        Leaky  Valve  . Heart Problems Father        Enlarged heart   . Heart defect Maternal Grandmother        Leaky Valve   . Stroke Paternal Grandfather      Review of Systems Pertinent items are noted in HPI.  Objective:   Patient Vitals for the past 8 hrs:  BP Temp Pulse Resp SpO2  03/09/20 1000 (!) 145/72 100 F (37.8 C) 82 18 100 %  03/09/20 0900 137/69 99.7 F (37.6 C) 89 20 99 %  03/09/20 0831 -- -- -- -- 99 %  03/09/20 0800 130/62 99.9 F (37.7 C) 93 (!) 25 100 %  03/09/20 0700 127/70 99.9 F (37.7 C) 91 16 99 %  03/09/20 0630 115/68 99.9 F (37.7 C) 96 20 97 %  03/09/20 0600 (!) 148/80 99.7 F (37.6 C) 92 16 99 %  03/09/20 0530 131/63 99.7 F (37.6 C) 95 18 99 %  03/09/20 0500 (!) 163/69 99.5 F (37.5 C) 80 16 100 %  03/09/20 0430 (!) 170/68 99.5 F (37.5 C) 81 16 100 %  03/09/20 0330 (!) 145/72 99.5 F (37.5 C) 66 16 96 %  03/09/20 0318 -- -- -- -- 98 %  03/09/20 0300 (!) 143/78 99.3 F (37.4 C) 66 16 96 %  03/09/20 0245 -- 99.3 F (37.4 C) 65 16 95 %  03/09/20 0230 -- 99.1 F (37.3 C) 67 18 97 %   I/O last 3 completed shifts: In: 1629.2 [I.V.:1579.2; IV Piggyback:50] Out: 1200 [Urine:1100; Blood:100] Total I/O In: 35.6 [I.V.:35.6] Out: 200 [Urine:200] On propofol, cleviprex, and 3% gtts Obese. Eyes open spontaneously, awake, regards Intubated Localizes on left, no movement on right. Does not follow commands Pulses palpable, groin dressing in place  Data Review:  CT heads performed this morning, 1:30 am, and yesterday were reviewed.  There is L MCA distribution hypodensity with area of hemorrhagic transformation in frontal operculum and in deep nuclei.  There is moderate amount of cytotoxic edema, with ~ 4-5 mm MLS, unchanged from 1:30 am.  Her hemorrhagic transformation is stable.    Assessment:   Active Problems:   Acute ischemic left MCA stroke (HCC)   Middle cerebral artery embolism, left   Plan:  - patient ~24 hours out from her CVA and is  certainly at risk of increased swelling.  However, at this point, as she is alert and awake, I would recommend continued medical management with 3% saline to maintain mild hypernatremia.  Per critical care, sodium goal is 150-155.  Per nursing, family wishes to pursue aggressive options even if functional benefit unlikely.  As such, if she develops decreased alertness, would recommend a repeat CT head.  If  there is increased swelling, she would be a candidate for left hemicraniectomy, though it will have to be emphasized to the family that although potentially life-saving, this procedure would significantly increase the chances of poor functional outcome and long-term dependency. - recommend to hold anti-platelet agents for now given hemorrhagic transformation and possibility of surgery in the future - HOB > 30 degrees, neurochecks per protocol - recommend to keep NPO for now - repeat CT head without contrast in am or if patient's alertness declines

## 2020-03-09 NOTE — Progress Notes (Signed)
  Echocardiogram 2D Echocardiogram has been performed.  Deborah [name] 03/09/2020, 3:46 PM

## 2020-03-09 NOTE — Progress Notes (Signed)
PT Cancellation Note  Patient Details Name: Deborah [name] MRN: [id number] DOB: 16-May-1971   Cancelled Treatment:    Reason Eval/Treat Not Completed: Patient not medically ready; intubated, sedated, increased swelling noted on CT's will attempt another day.   [name] 03/09/2020, 2:51 PM  [name], PT Acute Rehabilitation Services Pager:[phone] [phone] 03/09/2020

## 2020-03-09 NOTE — Progress Notes (Signed)
NAME:  Quintin AltoSylvia Vandervelden, MRN:  [id number], DOB:  06/03/1971, LOS: 1 ADMISSION DATE:  03/08/2020, CONSULTATION DATE:  03/08/20 REFERRING MD:  Neurology, CHIEF COMPLAINT:  CVA    Brief History   49 y/o female with history of IDDM, HLD, tobacco use disorder [facility] for facial droop, right sided weakness and global aphasia. Found to have LVO of left M1 segment and was [facility] attempt at endovascular revascularization.   History of present illness   49 y/o female presented after being found by her husband this morning with facial droop, right sided weakness, and global aphasia. LKN was 8:00 pm the night before. NIH score of 21 on arrival. She was immediately taken to IR and intubated for attempt at endovascular revascularization. Unfortunately, she had immediate reocclusion after achieving TICI 2c revascularization. Rescue stent not performed due to increased risk of ICH. She was transferred to the ICU for continued monitoring post-procedure.   Past Medical History  IDDM HLD Tobacco use Migraine headaches Chronic pain  Anxiety   Significant Hospital Events   9/21 > intubated for IR procedure 9/21> unsuccessful revascularization 9/21> admitted to neuro ICU  Consults:  PCCM Neurosurgery   Procedures:  9/21>cerebral arteriogram with emergent mechanical thrombectomy   Significant Diagnostic Tests:  9/21 CT head > Acute left MCA territory infarct without hemorrhagic conversion or progression from CT earlier today. Aspects remains 6  9/22 at 1 am CT head >Continued interval evolution of large left MCA territory infarct, overall similar in size and distribution from previous. Associated regional mass effect with partial effacement of the left lateral ventricle has mildly worsened, with worsened left-to-right midline shift now measuring up to 5 mm 9/22 at 7:50 am CT head > Unchanged extent of a large acute/early subacute left MCA vascular territory  infarct and associated hemorrhage as compared to the head CT performed earlier the same day at 12:57 a.m. Continued slight interval increase in mass effect with partial effacement of the left lateral ventricle and now 6 mm rightward midline shift (previously 5 mm). The basal cisterns remain patent. No evidence of ventricular entrapment.  Micro Data:  N/A  Antimicrobials:  N/A   Interim history/subjective:  CT head this morning with slight interval worsening of mass effect. Nursing notes she will open eyes to voice, and is able to hold up left arm when prompted but seems more sluggish.   Objective   Blood pressure 130/62, pulse 93, temperature 99.9 F (37.7 C), resp. rate (!) 25, height 5' (1.524 m), weight 81.8 kg, SpO2 99 %.    Vent Mode: PRVC FiO2 (%):  [40 %-50 %] 50 % Set Rate:  [18 bmp] 18 bmp Vt Set:  [370 mL] 370 mL PEEP:  [5 cmH20-8 cmH20] 8 cmH20 Plateau Pressure:  [16 cmH20-19 cmH20] 16 cmH20   Intake/Output Summary (Last 24 hours) at 03/09/2020 0855 Last data filed at 03/09/2020 0800 Gross per 24 hour  Intake 1591.04 ml  Output 1200 ml  Net 391.04 ml   Filed Weights   03/08/20 0923  Weight: 81.8 kg    Examination: General: intubated, sedated  HENT: PEERL Lungs: CTAB Cardiovascular: tachycardic; normal S1-S2 Abdomen: Soft, non-distended  Extremities: right femoral puncture site with slight palpable firmness; no active bleeding. Distal pulses intact bilaterally.  Neuro: Opens eyes to voice, doesn't follow commands, flaccid on right, spontaneous movements on left. Plantar reflex upgoing on right, downgoing on left   Resolved Hospital Problem list     Assessment & Plan:  Left MCA stroke, unsuccessful re-vascularization -serial brain imaging thus far with interval worsening of mass effect -hypertonic saline at 75 cc/hr with goal sodium of 150-155 -appreciate neurology and neurosurgery following -holding off on antiplatelet therapy given evidence of bleeding    -A1C pending -lipid panel shows poorly controlled HLD; will need high intensity statin  -TTE -Tele monitoring  -BP goal post procedure 120-140; continue cleveprex prn   Acute hypoxic respiratory failure secondary to CVA, need for procedure  -Continue full mechanical support -SBT when able  -PAD protocol with fentanyl, propofol -VAP bundle   IDDM with severe hyperglycemia -transitioned off insulin gtt to subcutaneous -CBGs stable on current regimen   Migraines Chronic pain, anxiety -holding home medications (xanax, fioricet, gabapentin, flexeril, tramadol) to accurately monitor mental status -monitor for signs of withdrawal   Best practice:  Diet: NPO Pain/Anxiety/Delirium protocol (if indicated): fentanyl, propofol VAP protocol (if indicated): Y DVT prophylaxis: SCDs GI prophylaxis: N/A Glucose control: SSI, Lantus Mobility: Bedrest Code Status: FULL Family Communication: pending Disposition: ICU  Labs   CBC: Recent Labs  Lab 03/08/20 0914 03/08/20 1151 03/08/20 1523 03/08/20 1544  WBC  --  24.2*  --   --   HGB 14.6 14.5 14.3 14.3  HCT 43.0 42.9 42.0 42.0  MCV  --  97.1  --   --   PLT  --  383  --   --     Basic Metabolic Panel: Recent Labs  Lab 03/08/20 0914 03/08/20 0914 03/08/20 1151 03/08/20 1523 03/08/20 1533 03/08/20 1544 03/08/20 2010  NA 130*   < > 135 138 136 139 133*  K 5.3*  --  4.0 3.8 4.0 3.9  --   CL 101  --  102  --  103  --   --   CO2  --   --  21*  --  20*  --   --   GLUCOSE 413*  --  251*  --  174*  --   --   BUN 16  --  13  --  13  --   --   CREATININE 0.80  --  1.11*  --  1.02*  --   --   CALCIUM  --   --  8.8*  --  8.7*  --   --   MG  --   --  2.0  --   --   --   --   PHOS  --   --  3.6  --   --   --   --    < > = values in this interval not displayed.   GFR: Estimated Creatinine Clearance: 64.6 mL/min (A) (by C-G formula based on SCr of 1.02 mg/dL (H)). Recent Labs  Lab 03/08/20 1151  WBC 24.2*    Liver Function  Tests: Recent Labs  Lab 03/08/20 1151  AST 24  ALT 30  ALKPHOS 169*  BILITOT 0.4  PROT 7.1  ALBUMIN 3.4*   No results for input(s): LIPASE, AMYLASE in the last 168 hours. No results for input(s): AMMONIA in the last 168 hours.  ABG    Component Value Date/Time   PHART 7.347 (L) 03/08/2020 1544   PCO2ART 39.2 03/08/2020 1544   PO2ART 103 03/08/2020 1544   HCO3 21.6 03/08/2020 1544   TCO2 23 03/08/2020 1544   ACIDBASEDEF 4.0 (H) 03/08/2020 1544   O2SAT 98.0 03/08/2020 1544     Coagulation Profile: Recent Labs  Lab 03/08/20 1151  INR 1.1  Cardiac Enzymes: No results for input(s): CKTOTAL, CKMB, CKMBINDEX, TROPONINI in the last 168 hours.  HbA1C: No results found for: HGBA1C  CBG: Recent Labs  Lab 03/08/20 1654 03/08/20 1826 03/08/20 1934 03/08/20 2101 03/08/20 2320  GLUCAP 176* 166* 172* 151* 276*    Review of Systems:   Review of Systems  Unable to perform ROS: Intubated     Social History   reports that she has been smoking cigarettes. She has been smoking about 0.50 packs per day. She has never used smokeless tobacco. She reports previous alcohol use. She reports previous drug use.   Family History   Her family history includes Heart Problems in her father; Heart defect in her maternal grandmother and mother; Stroke in her paternal grandfather.   Allergies Allergies  Allergen Reactions  . Aspirin Shortness Of Breath  . Mushroom Extract Complex Swelling     Home Medications  Prior to Admission medications   Medication Sig Start Date End Date Taking? Authorizing Provider  acetaminophen (TYLENOL) 325 MG tablet Take 325 mg by mouth every 6 (six) hours as needed.   Yes [provider]  albuterol (VENTOLIN HFA) 108 (90 Base) MCG/ACT inhaler Inhale 2 puffs into the lungs every 6 (six) hours as needed for wheezing or shortness of breath.    Yes [provider]  ALPRAZolam Prudy Feeler) 0.5 MG tablet Take 0.5 mg by mouth 3 (three) times  daily as needed for anxiety. 1 tablet by mouth TID PRN 06/20/16  Yes [provider]  atorvastatin (LIPITOR) 20 MG tablet Take 20 mg by mouth daily. 04/18/16  Yes [provider]  Butalbital-APAP-Caffeine 50-325-40 MG capsule Take 1 capsule by mouth 2 (two) times daily as needed for headache.  04/18/16  Yes [provider]  cyclobenzaprine (FLEXERIL) 5 MG tablet Take 5 mg by mouth 3 (three) times daily.  06/20/16  Yes [provider]  diphenhydrAMINE (BENADRYL) 25 mg capsule Take 25 mg by mouth daily.   Yes [provider]  glimepiride (AMARYL) 2 MG tablet Take 2 mg by mouth daily with breakfast.   Yes [provider]  insulin glargine (LANTUS SOLOSTAR) 100 UNIT/ML Solostar Pen Inject 28 Units into the skin daily.  07/10/16  Yes [provider]  liraglutide (VICTOZA) 18 MG/3ML SOPN Inject 1.8 mg into the skin daily. 1.8 daily     Yes [provider]  Phenyleph-Doxylamine-DM-APAP (NYQUIL SEVERE COLD/FLU) 5-6.25-10-325 MG/15ML LIQD Take 15 mLs by mouth every 4 (four) hours as needed (cough).   Yes [provider]  promethazine (PHENERGAN) 25 MG tablet Take 25 mg by mouth 3 (three) times daily as needed for nausea (dizziness).  06/28/16  Yes [provider]  traMADol (ULTRAM) 50 MG tablet Take 50-100 mg by mouth every 8 (eight) hours as needed for moderate pain.  07/10/16  Yes [provider]  clopidogrel (PLAVIX) 75 MG tablet Take 1 tablet (75 mg total) by mouth daily. 10/29/19   Georgeanna Lea, MD  Erenumab-aooe 70 MG/ML SOAJ Inject into the skin. Once Monthly Patient not taking: Reported on 03/08/2020    [provider]  glucose blood (ACCU-CHEK AVIVA PLUS) test strip USE 1 STRIP UTD 07/08/16   [provider]  Insulin Pen Needle (NOVOFINE) 32G X 6 MM MISC USE BID 07/04/16   [provider]  Lancets (ACCU-CHEK MULTICLIX) lancets U TID 07/03/16   [provider]  levofloxacin  (LEVAQUIN) 500 MG tablet Take by mouth. Patient not taking: Reported on 03/08/2020 08/27/16  [provider]   Lenward Chancellor DO, DO IMTS, PGY-3 03/09/20, 9:30 am

## 2020-03-09 NOTE — Progress Notes (Signed)
RT pulled back ETT 3cm per MD order. ETT was 25@lips , ETT is now 22@lips . RT will continue to monitor.

## 2020-03-10 ENCOUNTER — Inpatient Hospital Stay (HOSPITAL_COMMUNITY): Payer: Medicaid Other

## 2020-03-10 DIAGNOSIS — I6602 Occlusion and stenosis of left middle cerebral artery: Secondary | ICD-10-CM | POA: Diagnosis not present

## 2020-03-10 DIAGNOSIS — I63512 Cerebral infarction due to unspecified occlusion or stenosis of left middle cerebral artery: Secondary | ICD-10-CM | POA: Diagnosis not present

## 2020-03-10 DIAGNOSIS — I63319 Cerebral infarction due to thrombosis of unspecified middle cerebral artery: Secondary | ICD-10-CM | POA: Diagnosis not present

## 2020-03-10 DIAGNOSIS — R1312 Dysphagia, oropharyngeal phase: Secondary | ICD-10-CM

## 2020-03-10 DIAGNOSIS — G936 Cerebral edema: Secondary | ICD-10-CM

## 2020-03-10 DIAGNOSIS — J9622 Acute and chronic respiratory failure with hypercapnia: Secondary | ICD-10-CM | POA: Diagnosis not present

## 2020-03-10 LAB — BASIC METABOLIC PANEL
Anion gap: 7 (ref 5–15)
BUN: 9 mg/dL (ref 6–20)
CO2: 22 mmol/L (ref 22–32)
Calcium: 8.6 mg/dL — ABNORMAL LOW (ref 8.9–10.3)
Chloride: 126 mmol/L — ABNORMAL HIGH (ref 98–111)
Creatinine, Ser: 0.97 mg/dL (ref 0.44–1.00)
GFR calc Af Amer: >60 mL/min (ref >60)
GFR calc non Af Amer: >60 mL/min (ref >60)
Glucose, Bld: 189 mg/dL — ABNORMAL HIGH (ref 70–99)
Potassium: 4.7 mmol/L (ref 3.5–5.1)
Sodium: 155 mmol/L — ABNORMAL HIGH (ref 135–145)

## 2020-03-10 LAB — CBC WITH DIFFERENTIAL/PLATELET
Abs Immature Granulocytes: 0.12 10*3/uL — ABNORMAL HIGH (ref 0.00–0.07)
Basophils Absolute: 0.1 10*3/uL (ref 0.0–0.1)
Basophils Relative: 1 %
Eosinophils Absolute: 0.1 10*3/uL (ref 0.0–0.5)
Eosinophils Relative: 1 %
HCT: 37 % (ref 36.0–46.0)
Hemoglobin: 11.4 g/dL — ABNORMAL LOW (ref 12.0–15.0)
Immature Granulocytes: 1 %
Lymphocytes Relative: 19 %
Lymphs Abs: 2.5 10*3/uL (ref 0.7–4.0)
MCH: 32.6 pg (ref 26.0–34.0)
MCHC: 30.8 g/dL (ref 30.0–36.0)
MCV: 105.7 fL — ABNORMAL HIGH (ref 80.0–100.0)
Monocytes Absolute: 1.2 10*3/uL — ABNORMAL HIGH (ref 0.1–1.0)
Monocytes Relative: 9 %
Neutro Abs: 9.1 10*3/uL — ABNORMAL HIGH (ref 1.7–7.7)
Neutrophils Relative %: 69 %
Platelets: 284 10*3/uL (ref 150–400)
RBC: 3.5 MIL/uL — ABNORMAL LOW (ref 3.87–5.11)
RDW: 15.1 % (ref 11.5–15.5)
WBC: 13.1 10*3/uL — ABNORMAL HIGH (ref 4.0–10.5)
nRBC: 0 % (ref 0.0–0.2)

## 2020-03-10 LAB — GLUCOSE, CAPILLARY
Glucose-Capillary: 174 mg/dL — ABNORMAL HIGH (ref 70–99)
Glucose-Capillary: 197 mg/dL — ABNORMAL HIGH (ref 70–99)
Glucose-Capillary: 172 mg/dL — ABNORMAL HIGH (ref 70–99)
Glucose-Capillary: 195 mg/dL — ABNORMAL HIGH (ref 70–99)
Glucose-Capillary: 175 mg/dL — ABNORMAL HIGH (ref 70–99)
Glucose-Capillary: 168 mg/dL — ABNORMAL HIGH (ref 70–99)

## 2020-03-10 LAB — SODIUM
Sodium: 153 mmol/L — ABNORMAL HIGH (ref 135–145)
Sodium: 159 mmol/L — ABNORMAL HIGH (ref 135–145)
Sodium: 160 mmol/L — ABNORMAL HIGH (ref 135–145)

## 2020-03-10 LAB — TRIGLYCERIDES: Triglycerides: 252 mg/dL — ABNORMAL HIGH (ref <150)

## 2020-03-10 LAB — MAGNESIUM: Magnesium: 2.4 mg/dL (ref 1.7–2.4)

## 2020-03-10 LAB — PHOSPHORUS: Phosphorus: 2.9 mg/dL (ref 2.5–4.6)

## 2020-03-10 MED ORDER — VITAL HIGH PROTEIN PO LIQD
1000.0000 mL | ORAL | Status: DC
  Administered 2020-03-10 – 2020-03-17 (×7): 1000 mL
  Filled 2020-03-10 (×2): qty 1000

## 2020-03-10 MED ORDER — PROSOURCE TF PO LIQD
45.0000 mL | Freq: Every day | ORAL | Status: DC
  Administered 2020-03-11 – 2020-03-18 (×8): 45 mL
  Filled 2020-03-10 (×8): qty 45

## 2020-03-10 MED ORDER — SODIUM CHLORIDE 0.9 % IV SOLN: INTRAVENOUS | Status: DC

## 2020-03-10 MED ORDER — ADULT MULTIVITAMIN W/MINERALS CH
1.0000 | ORAL_TABLET | Freq: Every day | ORAL | Status: DC
  Administered 2020-03-10 – 2020-03-30 (×21): 1
  Filled 2020-03-10 (×21): qty 1

## 2020-03-10 MED ORDER — VITAL HIGH PROTEIN PO LIQD: 1000.0000 mL | ORAL | Status: DC

## 2020-03-10 MED ORDER — PROSOURCE TF PO LIQD: 45.0000 mL | Freq: Two times a day (BID) | ORAL | Status: DC

## 2020-03-10 NOTE — Procedures (Signed)
Patient Name: Deborah [name]  MRN: [id number]  Epilepsy Attending: Charlsie Quest  Referring Physician/Provider: Dr Marvel Plan Date: 03/10/2020  Duration: 24.04 mins  Patient history: 49yo F with  Left MCA infarct with R hemiparesis, facial droop, left gaze, and global aphasia. EEG to evaluate for seizure  Level of alertness: comatose  AEDs during EEG study: Propofol  Technical aspects: This EEG study was done with scalp electrodes positioned according to the 10-20 International system of electrode placement. Electrical activity was acquired at a sampling rate of 500Hz  and reviewed with a high frequency filter of 70Hz  and a low frequency filter of 1Hz . EEG data were recorded continuously and digitally stored.   Description: EEG showed continuous 3 to 6 Hz theta-delta slowing in left hemisphere as well as 8-10Hz  alpha activity in right hemisphere. Hyperventilation and photic stimulation were not performed.     ABNORMALITY -Continuous slow, lateralized left hemipshere  IMPRESSION: This study is suggestive of cortical dysfunction in left hemisphere likely secondary to underlying infarct. No seizures or epileptiform discharges were seen throughout the recording.  [name] 

## 2020-03-10 NOTE — Progress Notes (Signed)
Referring Physician(s): Dr. Iver NestleBhagat  Supervising Physician: Julieanne Cottoneveshwar, Sanjeev  Patient Status:  [facility]  Chief Complaint: History of acute CVA s/p cerebral arteriogram with emergent mechanical thrombectomy of left MCA M1 occlusion achieving a TICI 2c revascularization via right femoral approach 03/08/2020 by Dr. Corliss Skainseveshwar.  Subjective: Patient in bed, intubated; currently weaning. Minimal sedation. Eyes open, she follows some commands with persistent encouragement and direction. Strong left gaze preference/right side neglect. The right femoral site had palpable firmness post-procedure; imaging was ordered.   Vascular ultrasound was done 03/09/20 to evaluate right femoral puncture site: Right common femoral vein is patent Summary: No evidence of pseudoaneurysm, AVF or DVT  The groin site today is soft/unremarkable.   Allergies: Aspirin and Mushroom extract complex  Medications: Prior to Admission medications   Medication Sig Start Date End Date Taking? Authorizing Provider  acetaminophen (TYLENOL) 325 MG tablet Take 325 mg by mouth every 6 (six) hours as needed.   Yes [provider]  albuterol (VENTOLIN HFA) 108 (90 Base) MCG/ACT inhaler Inhale 2 puffs into the lungs every 6 (six) hours as needed for wheezing or shortness of breath.    Yes [provider]  ALPRAZolam Prudy Feeler(XANAX) 0.5 MG tablet Take 0.5 mg by mouth 3 (three) times daily as needed for anxiety. 1 tablet by mouth TID PRN 06/20/16  Yes [provider]  atorvastatin (LIPITOR) 20 MG tablet Take 20 mg by mouth daily. 04/18/16  Yes [provider]  Butalbital-APAP-Caffeine 50-325-40 MG capsule Take 1 capsule by mouth 2 (two) times daily as needed for headache.  04/18/16  Yes [provider]  cyclobenzaprine (FLEXERIL) 5 MG tablet Take 5 mg by mouth 3 (three) times daily.  06/20/16  Yes [provider]  diphenhydrAMINE (BENADRYL) 25 mg capsule Take 25 mg by mouth daily.   Yes  [provider]  glimepiride (AMARYL) 2 MG tablet Take 2 mg by mouth daily with breakfast.   Yes [provider]  insulin glargine (LANTUS SOLOSTAR) 100 UNIT/ML Solostar Pen Inject 28 Units into the skin daily.  07/10/16  Yes [provider]  liraglutide (VICTOZA) 18 MG/3ML SOPN Inject 1.8 mg into the skin daily. 1.8 daily     Yes [provider]  Phenyleph-Doxylamine-DM-APAP (NYQUIL SEVERE COLD/FLU) 5-6.25-10-325 MG/15ML LIQD Take 15 mLs by mouth every 4 (four) hours as needed (cough).   Yes [provider]  promethazine (PHENERGAN) 25 MG tablet Take 25 mg by mouth 3 (three) times daily as needed for nausea (dizziness).  06/28/16  Yes [provider]  traMADol (ULTRAM) 50 MG tablet Take 50-100 mg by mouth every 8 (eight) hours as needed for moderate pain.  07/10/16  Yes [provider]  clopidogrel (PLAVIX) 75 MG tablet Take 1 tablet (75 mg total) by mouth daily. 10/29/19   Georgeanna LeaKrasowski, Robert J, MD  Erenumab-aooe 70 MG/ML SOAJ Inject into the skin. Once Monthly Patient not taking: Reported on 03/08/2020    [provider]  glucose blood (ACCU-CHEK AVIVA PLUS) test strip USE 1 STRIP UTD 07/08/16   [provider]  Insulin Pen Needle (NOVOFINE) 32G X 6 MM MISC USE BID 07/04/16   [provider]  Lancets (ACCU-CHEK MULTICLIX) lancets U TID 07/03/16   [provider]  levofloxacin (LEVAQUIN) 500 MG tablet Take by mouth. Patient not taking: Reported on 03/08/2020 08/27/16   [provider]     Vital Signs: BP (!) 140/59   Pulse 66   Temp 99.1 F (37.3 C)  Resp 13   Ht 5' (1.524 m)   Wt 180 lb 5.4 oz (81.8 kg)   SpO2 97%   BMI 35.22 kg/m   Physical Exam Constitutional:      General: She is not in acute distress.    Comments: Eyes open. Intubated but currently weaning. Minimal sedation.   Cardiovascular:     Rate and Rhythm: Normal rate and regular rhythm.     Comments: Right groin site is  soft. No bleeding.  Skin:    General: Skin is warm and dry.  Neurological:     Comments: Patient able to close eyes and wiggle left toes with maximum encouragement. Unable to lift left arm but after we lifted it for her she was able to hold it up. Able to move left hand/wiggle fingers.  Right pupil 3 mm with sluggish response. Left pupil 2 mm with brisk response. Left gaze preference unable to cross midline. RLE withdraws from pain, no movements of RUE. Distal pulses (DPs) 2+ bilaterally     Imaging: CT HEAD WO CONTRAST  Result Date: 03/09/2020 CLINICAL DATA:  Parenchymal hemorrhage, follow-up. EXAM: CT HEAD WITHOUT CONTRAST TECHNIQUE: Contiguous axial images were obtained from the base of the skull through the vertex without intravenous contrast. COMPARISON:  Prior head CT examinations 03/09/2020 at 12:58 a.m. and earlier. FINDINGS: Brain: Continued interval evolution of cytotoxic edema throughout much of the left MCA vascular territory consistent with acute/early subacute infarction, not significantly changed in extent as compared to the most recent prior head CT. Continued slight interval increase in mass effect with partial effacement of the left lateral ventricle and now with 6 mm rightward midline shift (previously 5 mm). The basal cisterns remain patent. No evidence of ventricular entrapment. Unchanged small foci of parenchymal hemorrhage within the left basal ganglia. Unchanged appearance of additional scattered hemorrhage at the level of the left sylvian fissure and left frontal operculum. No interval hemorrhage is identified. No new large vessel territory infarct. Vascular: Redemonstrated hyperdensity within the M1 left MCA and proximal left M2 branches consistent with known reocclusion/thrombus. Skull: Normal. Negative for fracture or focal lesion. Sinuses/Orbits: Visualized orbits show no acute finding. Moderate ethmoid sinus mucosal thickening small amount of frothy secretions within the  bilateral sphenoid sinuses. No significant mastoid effusion. IMPRESSION: Unchanged extent of a large acute/early subacute left MCA vascular territory infarct and associated hemorrhage as compared to the head CT performed earlier the same day at 12:57 a.m. Continued slight interval increase in mass effect with partial effacement of the left lateral ventricle and now 6 mm rightward midline shift (previously 5 mm). The basal cisterns remain patent. No evidence of ventricular entrapment. Persistent hyperdensity within the M1 left middle cerebral artery and proximal left MCA branches consistent with known reocclusion/thrombus. Electronically Signed   By: Jackey Loge DO   On: 03/09/2020 08:27   CT HEAD WO CONTRAST  Result Date: 03/09/2020 CLINICAL DATA:  Follow-up examination for parenchymal hemorrhage. EXAM: CT HEAD WITHOUT CONTRAST TECHNIQUE: Contiguous axial images were obtained from the base of the skull through the vertex without intravenous contrast. COMPARISON:  Prior CT from 03/08/2020. FINDINGS: Brain: Continued interval evolution of cytotoxic edema throughout much of the left MCA territory, overall similar in size and distribution from previous. Scattered foci of superimposed parenchymal hemorrhage at the left lentiform nucleus are relatively unchanged. Additional scattered hemorrhage at the level of the left sylvian fissure and operculum has somewhat dispersed since previous, now less defined and less dense as compared to previous. No  definite new hemorrhage. Regional mass effect with partial effacement of the left lateral ventricle has mildly worsened. Associated left-to-right midline shift now measures up to 5 mm. No other new large vessel territory infarct. No extra-axial fluid collection. Vascular: Persistent hyperdensity within the left M1 segment and proximal left MCA branches, consistent with reocclusion/thrombus. Skull: Scalp soft tissues within normal limits.  Calvarium intact. Sinuses/Orbits:  Globes and orbital soft tissues demonstrate no acute finding. Scattered mucosal thickening noted within the ethmoidal air cells. Paranasal sinuses are otherwise clear. No mastoid effusion. Other: None. IMPRESSION: 1. Continued interval evolution of large left MCA territory infarct, overall similar in size and distribution from previous. Associated regional mass effect with partial effacement of the left lateral ventricle has mildly worsened, with worsened left-to-right midline shift now measuring up to 5 mm. 2. Scattered foci of superimposed parenchymal hemorrhage within the area of infarction, stable to slightly decreased from previous. A portion of the apparent improvement in this finding may reflect interval clearing of contrast staining. 3. Persistent hyperdensity within the left M1 segment and proximal left MCA branches, consistent with reocclusion/thrombus. 4. No other new acute intracranial abnormality. Electronically Signed   By: Rise Mu M.D.   On: 03/09/2020 01:51   CT HEAD WO CONTRAST  Result Date: 03/08/2020 CLINICAL DATA:  Stroke, follow-up. EXAM: CT HEAD WITHOUT CONTRAST TECHNIQUE: Contiguous axial images were obtained from the base of the skull through the vertex without intravenous contrast. COMPARISON:  Noncontrast head CT examine age performed earlier the same day 03/08/2020 at 7:57 a.m. and 10:26 a.m., CT angiogram head/neck 03/08/2020, neuro interventional procedural note from earlier the same day. FINDINGS: Brain: As compared to the head CT performed earlier the same day at 7:57 a.m., there has been significant interval progression of acute ischemic infarction changes within the left MCA vascular territory affecting portions of the left frontal lobe, parietal lobe, temporal lobe and left insula. The infarct is now large. Small foci of parenchymal hemorrhage within the left basal ganglia do not appear significantly changed from the head CT performed earlier the same day at 10:26  a.m. However, slightly increased from this prior examination, there has been a slight interval increase in a small to moderate amount of hemorrhage and/or contrast within the left perisylvian region and more superiorly along the left cerebral convexity. There is increased mass effect with increased partial effacement of the left lateral ventricle and now 4 mm rightward midline shift. Vascular: Residual circulating contrast limits evaluation for abnormal vessel hyperdensity Skull: Normal. Negative for fracture or focal lesion. Sinuses/Orbits: Visualized orbits show no acute finding. Mild ethmoid sinus mucosal thickening. Small right ethmoid sinus osteoma. No significant mastoid effusion. These results will be called to the ordering clinician or representative by the Radiologist Assistant, and communication documented in the PACS or [employer]. IMPRESSION: Significantly increased acute ischemic infarction changes within the left MCA vascular territory as compared to the head CT performed earlier the same day at 7:57 a.m. The left MCA infarct is now large. Increased mass effect and partial effacement of the left lateral ventricle now with 4 mm rightward midline shift. Small foci of parenchymal hemorrhage within the left basal ganglia do not appear significantly changed from the head CT performed earlier the same day at 10:26 a.m. However, small to moderate volume hemorrhage and/or contrast within the left perisylvian region and more superiorly along the left cerebral convexity has slightly increased since this prior study. Electronically Signed   By: Jackey Loge DO   On:  03/08/2020 18:18   CT HEAD WO CONTRAST  Result Date: 03/08/2020 CLINICAL DATA:  Referral for stroke treatment. EXAM: CT HEAD WITHOUT CONTRAST TECHNIQUE: Contiguous axial images were obtained from the base of the skull through the vertex without intravenous contrast. COMPARISON:  Head CT earlier today around the hospital FINDINGS: Brain:  Cytotoxic edema seen in a matching distribution to prior, present at the putamen, insula, ganglionic cortex level, and supra ganglionic left frontal parietal cortex. No hemorrhagic conversion. There is still intravenous and dural contrast from preceding CTA. Vascular: Stable Skull: Negative Sinuses/Orbits: Negative Other: These results were called by telephone at the time of interpretation on 03/08/2020 at 8:49 am to provider [facility] , who verbally acknowledged these results. IMPRESSION: Acute left MCA territory infarct without hemorrhagic conversion or progression from CT earlier today. Aspects remains 6. Electronically Signed   By: Marnee Spring M.D.   On: 03/08/2020 08:49   MR ANGIO HEAD WO CONTRAST  Result Date: 03/09/2020 CLINICAL DATA:  Stroke follow-up EXAM: MRI HEAD WITHOUT CONTRAST MRA HEAD WITHOUT CONTRAST TECHNIQUE: Multiplanar, multiecho pulse sequences of the brain and surrounding structures were obtained without intravenous contrast. Angiographic images of the head were obtained using MRA technique without contrast. COMPARISON:  CT head 03/09/2020 FINDINGS: MRI HEAD FINDINGS Brain: Large area of acute infarct left MCA territory appears similar in size to the CT earlier today. There is restricted diffusion throughout the left basal ganglia as well as in the left frontal and parietal lobe. The insula and superior temporal lobe is involved. There is moderate hemorrhage within the infarct as well as some mild subarachnoid hemorrhage, similar to the prior CT. 7 mm midline shift to the right. No hydrocephalus. No evidence of chronic ischemic change. Vascular: Normal arterial flow voids. Skull and upper cervical spine: Negative Sinuses/Orbits: Mucosal edema paranasal sinuses.  Negative orbit Other: None MRA HEAD FINDINGS Both vertebral arteries widely patent. PICA patent bilaterally. Basilar widely patent. AICA, superior cerebellar, posterior cerebral arteries widely patent. Short segment signal  loss anterior genu right internal carotid artery probably artifact. This vessel appears patent on CTA. Right middle cerebral artery patent. Anterior cerebral arteries widely patent Early branch of the left MCA is patent which appears to supply the temporal lobe. The left middle cerebral artery is occluded distal to this early branch. IMPRESSION: 1. Large territory infarct left MCA territory with moderate hemorrhage and mild subarachnoid hemorrhage. There is edema and mass-effect with 7 mm midline shift to the right. 2. Left MCA is occluded in the M1 segment distal to an early temporal branch. No other intracranial stenosis. Electronically Signed   By: Marlan Palau M.D.   On: 03/09/2020 18:50   MR BRAIN WO CONTRAST  Result Date: 03/09/2020 CLINICAL DATA:  Stroke follow-up EXAM: MRI HEAD WITHOUT CONTRAST MRA HEAD WITHOUT CONTRAST TECHNIQUE: Multiplanar, multiecho pulse sequences of the brain and surrounding structures were obtained without intravenous contrast. Angiographic images of the head were obtained using MRA technique without contrast. COMPARISON:  CT head 03/09/2020 FINDINGS: MRI HEAD FINDINGS Brain: Large area of acute infarct left MCA territory appears similar in size to the CT earlier today. There is restricted diffusion throughout the left basal ganglia as well as in the left frontal and parietal lobe. The insula and superior temporal lobe is involved. There is moderate hemorrhage within the infarct as well as some mild subarachnoid hemorrhage, similar to the prior CT. 7 mm midline shift to the right. No hydrocephalus. No evidence of chronic ischemic change. Vascular: Normal arterial  flow voids. Skull and upper cervical spine: Negative Sinuses/Orbits: Mucosal edema paranasal sinuses.  Negative orbit Other: None MRA HEAD FINDINGS Both vertebral arteries widely patent. PICA patent bilaterally. Basilar widely patent. AICA, superior cerebellar, posterior cerebral arteries widely patent. Short segment  signal loss anterior genu right internal carotid artery probably artifact. This vessel appears patent on CTA. Right middle cerebral artery patent. Anterior cerebral arteries widely patent Early branch of the left MCA is patent which appears to supply the temporal lobe. The left middle cerebral artery is occluded distal to this early branch. IMPRESSION: 1. Large territory infarct left MCA territory with moderate hemorrhage and mild subarachnoid hemorrhage. There is edema and mass-effect with 7 mm midline shift to the right. 2. Left MCA is occluded in the M1 segment distal to an early temporal branch. No other intracranial stenosis. Electronically Signed   By: Marlan Palau M.D.   On: 03/09/2020 18:50   IR CT Head Ltd  Result Date: 03/10/2020 INDICATION: Right-sided hemiplegia, left gaze preference and global aphasia. Occluded left middle cerebral artery M1 segment on CT angiogram of the head and neck. EXAM: 1. EMERGENT LARGE VESSEL OCCLUSION THROMBOLYSIS (anterior CIRCULATION) COMPARISON:  CT angiogram of the head and neck of September 21, from [facility]. MEDICATIONS: Ancef 2 g IV. The antibiotic was administered within 1 hour of the procedure. ANESTHESIA/SEDATION: General anesthesia CONTRAST:  Isovue 300 approximately 140 mL FLUOROSCOPY TIME:  Fluoroscopy Time: 48 minutes 40 seconds (989 mGy). COMPLICATIONS: None immediate. TECHNIQUE: Following a full explanation of the procedure along with the potential associated complications, an informed witnessed consent was obtained. The risks of intracranial hemorrhage of 10%, worsening neurological deficit, ventilator dependency, death and inability to revascularize were all reviewed in detail with the patient's spouse. The patient was then put under general anesthesia by the Department of Anesthesiology at [facility]. The right groin was prepped and draped in the usual sterile fashion. Thereafter using modified Seldinger technique, transfemoral access  into the right common femoral artery was obtained without difficulty. Over a 0.035 inch guidewire an 8 French 25 cm Pinnacle sheath was inserted. Through this, and also over a 0.035 inch guidewire a combination of an 087 balloon guide catheter inside of which was a 6 [place] select support catheter was advanced to the aortic arch region, and selectively advanced into distal left common carotid artery. The guidewire was removed. Good aspiration obtained from the hub of the balloon guide catheter following removal of the guidewire, and the select support catheter. A control arteriogram was then performed just proximal to the left common carotid bifurcation centered extra cranially and intracranially. FINDINGS: The left common carotid arteriogram demonstrates the left external carotid artery and its major branches to be widely patent. The left internal carotid artery at the bulb to the cranial skull base is widely patent with a focal approximately 50% stenosis in the mid cervical segment. The petrous, the cavernous and the supraclinoid segments demonstrate wide patency. The left middle cerebral artery demonstrates complete occlusion in its proximal M1 segment. The left anterior cerebral artery opacifies into the capillary and venous phases with retrograde opacification of the left MCA distribution in distal parietal cortical and subcortical regions. PROCEDURE: Through the balloon guide catheter in the proximal left internal carotid artery, a combination of a 021 Trevo ProVue microcatheter inside of a 071 Zoom 134 cm aspiration catheter was advanced over a 0.014 inch Synchro standard micro guidewire to the cervical left ICA. The micro guidewire was then gently advanced using a  torque device through the occluded left middle cerebral artery into the M2 M3 region of the inferior division followed by the microcatheter. The guidewire was removed. Good aspiration obtained from the hub of the microcatheter which was then  connected to continuous heparinized saline infusion. The microcatheter was retrieved and removed. Through the Zoom 071 aspiration catheter which is now positioned into the distal M1 segment a 35 Zoom aspiration catheter was advanced in a coaxial manner over a micro guidewire and positioned in the distal M2 M3 segment. Safe position of the tip of the 35 Zoom aspiration catheter was verified with good aspiration of blood at the hub. Thereafter with proximal flow arrest in the proximal left internal carotid artery, and aspiration at the hub of the 071 aspiration catheter with the Penumbra aspiration device for approximately 2-1/2 minutes after removal of the 035 aspiration catheter was performed. The aspiration catheter was then gently retrieved and removed with constant aspiration being applied with a 60 mL syringe at the hub of the balloon guide catheter in the left internal carotid artery. Flow arrest was then reversed. A control arteriogram performed through the balloon guide in the left internal carotid artery proximally demonstrated mild improvement in the recanalization of the left middle cerebral artery M1 segment. A second pass was then again made using the above combination with axis being obtained into the distal M2 M3 segment of the inferior division of the left middle cerebral artery. The micro guidewire was removed from the hub of the microcatheter. Free aspiration of blood was noted at the hub. This was then connected to continuous infusion. A Tiger 17 retrieval device was then advanced to the distal end of the microcatheter. The O ring on the delivery microcatheter was loosened. The Tiger 17 was then deployed in the usual manner, with the proximal marker just proximal to the hub of the aspiration catheter which was now imbedded in the occluded left middle cerebral artery. Expansion of the Tiger retrieval device was then performed to approximately 2 mm where it was maintained for approximately 2 minutes.  This was then reduced to approximately 1 mm using the massaging technique. The Tiger 17 was again expanded to approximately 2 mm. With constant aspiration of the hub of the aspiration catheter in the left middle cerebral artery, the retrieval device was retrieved into the 071 aspiration Zoom catheter and the combination was retrieved and removed. Proximal flow arrest was reversed. At this time chunks of clot were seen imbedded in the interstices of the retrieval device. A control arteriogram performed through the balloon guide in the left internal carotid artery demonstrated modest improvement in the left MCA proximally, with opacification of anterior perisylvian branches achieving a TICI 2A revascularization. A third pass was then made using the above combination again using the 071 Zoom aspiration catheter advanced into occluded left middle cerebral artery distal M1 segment over a microcatheter and an 021 micro guidewire in the inferior division. Following ascertained safe position of the tip of the microcatheter as mentioned earlier, a 3 mm x 20 mm Solitaire X retrieval device was advanced and positioned with proximal aspect just proximal to the occluded left MCA M1 segment. With proximal flow arrest in the left internal carotid artery, and constant aspiration via an 071 Zoom aspiration catheter for approximately 2-1/2 minutes and a 60 mL syringe at the hub of the balloon guide catheter in the left internal carotid artery, the combination of the retrieval device, the microcatheter, the 071 aspiration catheter, the combination was  retrieved and removed. Following reversal of flow in the left internal carotid artery, a control arteriogram performed through this demonstrated near complete revascularization of the left middle cerebral artery territory achieving a TICI 2C revascularization. This, however, also revealed a high-grade stenosis of the distal left middle cerebral M1 segment secondary to atherosclerotic  plaque. A CT of the brain was performed which demonstrated focal areas of hyper attenuation in the left basal ganglia region, and also the left perisylvian region consistent with mixture of cerebral artery hemorrhage, and contrast. No evidence of a mass effect or midline shift was seen. A control arteriogram performed through the balloon guide catheter in the left internal carotid artery demonstrated reocclusion of the left middle cerebral artery in the distal M1 segment. Further revascularization would entail placement of rescue stenting with the addition of least 2 oral antiplatelets, and also cangrelor infusion to prevent thrombosis within the stent. However, given the patient's initial presentation, the neuro imaging findings, the CT perfusion data, this option would place the patient at a significantly higher risk of intracranial hemorrhage. After further consultation with referring neurologist it was elected to stop at this juncture. The balloon guide was retrieved and removed. The 8 French Pinnacle sheath was replaced with an 8 French Angio-Seal closure device with hemostasis. Distal pulses remained Dopplerable in the right foot, and palpable in the left foot. Patient was left intubated in view of the patient's medical condition. She was then transferred to the neuro ICU for post revascularization care. IMPRESSION: Status post endovascular near complete revascularization of occluded left middle cerebral M1 segment achieving a TICI 2C revascularization with 1 pass with the aspiration device, 1 pass with the Tiger 17 retrieval device with aspiration, and 1 pass with the Solitaire 3 mm x 20 mm retrieval device and aspiration as mentioned above. Reocclusion of the left middle cerebral artery secondary to underlying severe intracranial arteriosclerotic plaque in the distal left M1 segment distally. PLAN: Follow-up as per referring MD. Electronically Signed   By: Julieanne Cotton M.D.   On: 03/09/2020 09:34    CT Code Stroke Cerebral Perfusion with contrast  Result Date: 03/08/2020 CLINICAL DATA:  Stroke. EXAM: CT PERFUSION BRAIN TECHNIQUE: Multiphase CT imaging of the brain was performed following IV bolus contrast injection. Subsequent parametric perfusion maps were calculated using RAPID software. CONTRAST:  40mL OMNIPAQUE IOHEXOL 350 MG/ML SOLN COMPARISON:  CT and CTA from earlier today. FINDINGS: CT Brain Perfusion Findings: CBF (<30%) Volume: 63mL Perfusion (Tmax>6.0s) volume: 95mL Mismatch Volume: 32mL ASPECTS on noncontrast CT Head: 6 at 545 today. The left MCA territory infarct location has good general agreement with a noncontrast head CT, although is more extensive at the level of the lateral frontal lobe. IMPRESSION: 1. 63 cc core infarct in the left cerebral hemisphere with good general agreement between the CT perfusion and preceding noncontrast CT. 2. 32 cc of estimated penumbra elsewhere in the left MCA territory. Electronically Signed   By: Marnee Spring M.D.   On: 03/08/2020 08:26   DG Chest Port 1 View  Result Date: 03/09/2020 CLINICAL DATA:  Hypoxia EXAM: PORTABLE CHEST 1 VIEW COMPARISON:  March 08, 2020 FINDINGS: Endotracheal tube tip is at the origin of the right main bronchus. Nasogastric tube tip and side port in stomach. No pneumothorax. There is atelectatic change in the left base. Lungs otherwise are clear. Heart is upper normal in size with pulmonary vascularity normal. No adenopathy. No bone lesions. IMPRESSION: Endotracheal tube tip in proximal right main bronchus. Advise withdrawing  endotracheal tube approximately 3.5 cm. Atelectatic change left base. Lungs otherwise clear. No pneumothorax. Stable cardiac silhouette. Critical Value/emergent results were called by telephone at the time of interpretation on 03/09/2020 at 8:01 am to provider Philbert Riser, RN, who verbally acknowledged these results. Electronically Signed   By: Bretta Bang III M.D.   On: 03/09/2020 08:01    DG Abd Portable 1V  Result Date: 03/09/2020 CLINICAL DATA:  Nasogastric tube placement EXAM: PORTABLE ABDOMEN - 1 VIEW COMPARISON:  None. FINDINGS: Nasogastric tube tip and side port are in the stomach. There is no bowel dilatation or air-fluid level to suggest bowel obstruction. No free air. Surgical clips noted in lower abdomen slightly to the left of midline. IMPRESSION: Nasogastric tube tip and side port in stomach. No bowel obstruction or free air evident on supine examination. Electronically Signed   By: Bretta Bang III M.D.   On: 03/09/2020 07:59   VAS [place] GROIN PSEUDOANEURYSM  Result Date: 03/09/2020  ARTERIAL PSEUDOANEURYSM  Exam: Right groin Indications: Bleeding from access site, s/p cerebral arteriogram with mechanical thrombectomy via right femoral approach 03/08/2020. Performing Technologist: Gertie Fey MHA, RDMS, RVT, RDCS  Examination Guidelines: A complete evaluation includes B-mode imaging, spectral Doppler, color Doppler, and power Doppler as needed of all accessible portions of each vessel. Bilateral testing is considered an integral part of a complete examination. Limited examinations for reoccurring indications may be performed as noted. +------------+----------+---------+------+----------+ Right DuplexPSV (cm/s)Waveform PlaqueComment(s) +------------+----------+---------+------+----------+ Ext.Iliac      211    triphasic                 +------------+----------+---------+------+----------+ CFA            211    triphasic                 +------------+----------+---------+------+----------+ Prox SFA       -104   triphasic                 +------------+----------+---------+------+----------+ Right Vein comments: right common femoral vein is patent  Summary: No evidence of pseudoaneurysm, AVF or DVT  Diagnosing physician: Coral Else MD Electronically signed by Coral Else MD on 03/09/2020 at 9:35:09 PM.    --------------------------------------------------------------------------------    Final    ECHOCARDIOGRAM COMPLETE  Result Date: 03/09/2020    ECHOCARDIOGRAM REPORT   Patient Name:   Deborah [name] Date of Exam: 03/09/2020 Medical Rec #:  [id number]         Height:       60.0 in Accession #:    [id number]        Weight:       180.3 lb Date of Birth:  08-13-1970          BSA:          1.786 m Patient Age:    49 years          BP:           149/75 mmHg Patient Gender: F                 HR:           94 bpm. Exam Location:  Outpatient Procedure: 2D Echo Indications:    Stroke I163.9  History:        Patient has prior history of Echocardiogram examinations, most                 recent 11/13/2019. Risk Factors:Dyslipidemia and Diabetes.  Sonographer:    Thurman Coyer  RDCS (AE) Referring Phys: [id number] SRISHTI L BHAGAT IMPRESSIONS  1. Left ventricular ejection fraction, by estimation, is 60 to 65%. The left ventricle has normal function. The left ventricle has no regional wall motion abnormalities. Left ventricular diastolic parameters were normal.  2. Right ventricular systolic function is normal. The right ventricular size is normal. Tricuspid regurgitation signal is inadequate for assessing PA pressure.  3. The mitral valve is normal in structure. Trivial mitral valve regurgitation. No evidence of mitral stenosis.  4. The aortic valve is tricuspid. Aortic valve regurgitation is not visualized. No aortic stenosis is present.  5. The inferior vena cava is normal in size with greater than 50% respiratory variability, suggesting right atrial pressure of 3 mmHg. Comparison(s): No significant change from prior study. Conclusion(s)/Recommendation(s): No intracardiac source of embolism detected on this transthoracic study. A transesophageal echocardiogram is recommended to exclude cardiac source of embolism if clinically indicated. FINDINGS  Left Ventricle: Left ventricular ejection fraction, by estimation, is 60 to  65%. The left ventricle has normal function. The left ventricle has no regional wall motion abnormalities. The left ventricular internal cavity size was normal in size. There is  no left ventricular hypertrophy. Left ventricular diastolic parameters were normal. Right Ventricle: The right ventricular size is normal. No increase in right ventricular wall thickness. Right ventricular systolic function is normal. Tricuspid regurgitation signal is inadequate for assessing PA pressure. Left Atrium: Left atrial size was normal in size. Right Atrium: Right atrial size was normal in size. Pericardium: There is no evidence of pericardial effusion. Mitral Valve: The mitral valve is normal in structure. Trivial mitral valve regurgitation. No evidence of mitral valve stenosis. Tricuspid Valve: The tricuspid valve is normal in structure. Tricuspid valve regurgitation is trivial. No evidence of tricuspid stenosis. Aortic Valve: The aortic valve is tricuspid. Aortic valve regurgitation is not visualized. No aortic stenosis is present. Pulmonic Valve: The pulmonic valve was not well visualized. Pulmonic valve regurgitation is not visualized. No evidence of pulmonic stenosis. Aorta: The aortic root is normal in size and structure. Venous: The inferior vena cava is normal in size with greater than 50% respiratory variability, suggesting right atrial pressure of 3 mmHg. IAS/Shunts: No atrial level shunt detected by color flow Doppler.  LEFT VENTRICLE PLAX 2D LVIDd:         3.70 cm  Diastology LVIDs:         2.70 cm  LV e' medial:    9.36 cm/s LV PW:         1.00 cm  LV E/e' medial:  11.2 LV IVS:        0.90 cm  LV e' lateral:   11.40 cm/s LVOT diam:     1.70 cm  LV E/e' lateral: 9.2 LV SV:         42 LV SV Index:   24 LVOT Area:     2.27 cm  RIGHT VENTRICLE RV S prime:     15.30 cm/s TAPSE (M-mode): 2.0 cm LEFT ATRIUM             Index       RIGHT ATRIUM          Index LA diam:        2.70 cm 1.51 cm/m  RA Area:     9.00 cm LA Vol  (A2C):   25.5 ml 14.28 ml/m RA Volume:   14.70 ml 8.23 ml/m LA Vol (A4C):   24.4 ml 13.66 ml/m LA Biplane Vol: 25.7 ml 14.39  ml/m  AORTIC VALVE LVOT Vmax:   83.30 cm/s LVOT Vmean:  64.800 cm/s LVOT VTI:    0.186 m MITRAL VALVE MV Area (PHT): 4.06 cm     SHUNTS MV Decel Time: 187 msec     Systemic VTI:  0.19 m MV E velocity: 105.00 cm/s  Systemic Diam: 1.70 cm MV A velocity: 107.00 cm/s MV E/A ratio:  0.98 Jodelle Red MD Electronically signed by Jodelle Red MD Signature Date/Time: 03/09/2020/7:14:28 PM    Final    IR PERCUTANEOUS ART THROMBECTOMY/INFUSION INTRACRANIAL INC DIAG ANGIO  Result Date: 03/10/2020 INDICATION: Right-sided hemiplegia, left gaze preference and global aphasia. Occluded left middle cerebral artery M1 segment on CT angiogram of the head and neck. EXAM: 1. EMERGENT LARGE VESSEL OCCLUSION THROMBOLYSIS (anterior CIRCULATION) COMPARISON:  CT angiogram of the head and neck of September 21, from [facility]. MEDICATIONS: Ancef 2 g IV. The antibiotic was administered within 1 hour of the procedure. ANESTHESIA/SEDATION: General anesthesia CONTRAST:  Isovue 300 approximately 140 mL FLUOROSCOPY TIME:  Fluoroscopy Time: 48 minutes 40 seconds (989 mGy). COMPLICATIONS: None immediate. TECHNIQUE: Following a full explanation of the procedure along with the potential associated complications, an informed witnessed consent was obtained. The risks of intracranial hemorrhage of 10%, worsening neurological deficit, ventilator dependency, death and inability to revascularize were all reviewed in detail with the patient's spouse. The patient was then put under general anesthesia by the Department of Anesthesiology at [facility]. The right groin was prepped and draped in the usual sterile fashion. Thereafter using modified Seldinger technique, transfemoral access into the right common femoral artery was obtained without difficulty. Over a 0.035 inch guidewire an 8 French 25  cm Pinnacle sheath was inserted. Through this, and also over a 0.035 inch guidewire a combination of an 087 balloon guide catheter inside of which was a 6 [place] select support catheter was advanced to the aortic arch region, and selectively advanced into distal left common carotid artery. The guidewire was removed. Good aspiration obtained from the hub of the balloon guide catheter following removal of the guidewire, and the select support catheter. A control arteriogram was then performed just proximal to the left common carotid bifurcation centered extra cranially and intracranially. FINDINGS: The left common carotid arteriogram demonstrates the left external carotid artery and its major branches to be widely patent. The left internal carotid artery at the bulb to the cranial skull base is widely patent with a focal approximately 50% stenosis in the mid cervical segment. The petrous, the cavernous and the supraclinoid segments demonstrate wide patency. The left middle cerebral artery demonstrates complete occlusion in its proximal M1 segment. The left anterior cerebral artery opacifies into the capillary and venous phases with retrograde opacification of the left MCA distribution in distal parietal cortical and subcortical regions. PROCEDURE: Through the balloon guide catheter in the proximal left internal carotid artery, a combination of a 021 Trevo ProVue microcatheter inside of a 071 Zoom 134 cm aspiration catheter was advanced over a 0.014 inch Synchro standard micro guidewire to the cervical left ICA. The micro guidewire was then gently advanced using a torque device through the occluded left middle cerebral artery into the M2 M3 region of the inferior division followed by the microcatheter. The guidewire was removed. Good aspiration obtained from the hub of the microcatheter which was then connected to continuous heparinized saline infusion. The microcatheter was retrieved and removed. Through the Zoom 071  aspiration catheter which is now positioned into the distal M1 segment a  35 Zoom aspiration catheter was advanced in a coaxial manner over a micro guidewire and positioned in the distal M2 M3 segment. Safe position of the tip of the 35 Zoom aspiration catheter was verified with good aspiration of blood at the hub. Thereafter with proximal flow arrest in the proximal left internal carotid artery, and aspiration at the hub of the 071 aspiration catheter with the Penumbra aspiration device for approximately 2-1/2 minutes after removal of the 035 aspiration catheter was performed. The aspiration catheter was then gently retrieved and removed with constant aspiration being applied with a 60 mL syringe at the hub of the balloon guide catheter in the left internal carotid artery. Flow arrest was then reversed. A control arteriogram performed through the balloon guide in the left internal carotid artery proximally demonstrated mild improvement in the recanalization of the left middle cerebral artery M1 segment. A second pass was then again made using the above combination with axis being obtained into the distal M2 M3 segment of the inferior division of the left middle cerebral artery. The micro guidewire was removed from the hub of the microcatheter. Free aspiration of blood was noted at the hub. This was then connected to continuous infusion. A Tiger 17 retrieval device was then advanced to the distal end of the microcatheter. The O ring on the delivery microcatheter was loosened. The Tiger 17 was then deployed in the usual manner, with the proximal marker just proximal to the hub of the aspiration catheter which was now imbedded in the occluded left middle cerebral artery. Expansion of the Tiger retrieval device was then performed to approximately 2 mm where it was maintained for approximately 2 minutes. This was then reduced to approximately 1 mm using the massaging technique. The Tiger 17 was again expanded to  approximately 2 mm. With constant aspiration of the hub of the aspiration catheter in the left middle cerebral artery, the retrieval device was retrieved into the 071 aspiration Zoom catheter and the combination was retrieved and removed. Proximal flow arrest was reversed. At this time chunks of clot were seen imbedded in the interstices of the retrieval device. A control arteriogram performed through the balloon guide in the left internal carotid artery demonstrated modest improvement in the left MCA proximally, with opacification of anterior perisylvian branches achieving a TICI 2A revascularization. A third pass was then made using the above combination again using the 071 Zoom aspiration catheter advanced into occluded left middle cerebral artery distal M1 segment over a microcatheter and an 021 micro guidewire in the inferior division. Following ascertained safe position of the tip of the microcatheter as mentioned earlier, a 3 mm x 20 mm Solitaire X retrieval device was advanced and positioned with proximal aspect just proximal to the occluded left MCA M1 segment. With proximal flow arrest in the left internal carotid artery, and constant aspiration via an 071 Zoom aspiration catheter for approximately 2-1/2 minutes and a 60 mL syringe at the hub of the balloon guide catheter in the left internal carotid artery, the combination of the retrieval device, the microcatheter, the 071 aspiration catheter, the combination was retrieved and removed. Following reversal of flow in the left internal carotid artery, a control arteriogram performed through this demonstrated near complete revascularization of the left middle cerebral artery territory achieving a TICI 2C revascularization. This, however, also revealed a high-grade stenosis of the distal left middle cerebral M1 segment secondary to atherosclerotic plaque. A CT of the brain was performed which demonstrated focal areas of hyper  attenuation in the left basal  ganglia region, and also the left perisylvian region consistent with mixture of cerebral artery hemorrhage, and contrast. No evidence of a mass effect or midline shift was seen. A control arteriogram performed through the balloon guide catheter in the left internal carotid artery demonstrated reocclusion of the left middle cerebral artery in the distal M1 segment. Further revascularization would entail placement of rescue stenting with the addition of least 2 oral antiplatelets, and also cangrelor infusion to prevent thrombosis within the stent. However, given the patient's initial presentation, the neuro imaging findings, the CT perfusion data, this option would place the patient at a significantly higher risk of intracranial hemorrhage. After further consultation with referring neurologist it was elected to stop at this juncture. The balloon guide was retrieved and removed. The 8 French Pinnacle sheath was replaced with an 8 French Angio-Seal closure device with hemostasis. Distal pulses remained Dopplerable in the right foot, and palpable in the left foot. Patient was left intubated in view of the patient's medical condition. She was then transferred to the neuro ICU for post revascularization care. IMPRESSION: Status post endovascular near complete revascularization of occluded left middle cerebral M1 segment achieving a TICI 2C revascularization with 1 pass with the aspiration device, 1 pass with the Tiger 17 retrieval device with aspiration, and 1 pass with the Solitaire 3 mm x 20 mm retrieval device and aspiration as mentioned above. Reocclusion of the left middle cerebral artery secondary to underlying severe intracranial arteriosclerotic plaque in the distal left M1 segment distally. PLAN: Follow-up as per referring MD. Electronically Signed   By: Julieanne Cotton M.D.   On: 03/09/2020 09:34   [place] EKG SITE RITE  Result Date: 03/09/2020 If Site Rite image not attached, placement could not be confirmed  due to current cardiac rhythm.   Labs:  CBC: Recent Labs    03/08/20 1151 03/08/20 1151 03/08/20 1523 03/08/20 1544 03/09/20 1119 03/10/20 0449  WBC 24.2*  --   --   --  18.7* 13.1*  HGB 14.5   < > 14.3 14.3 12.1 11.4*  HCT 42.9   < > 42.0 42.0 37.3 37.0  PLT 383  --   --   --  284 284   < > = values in this interval not displayed.    COAGS: Recent Labs    03/08/20 1151  INR 1.1  APTT 25    BMP: Recent Labs    03/08/20 1151 03/08/20 1523 03/08/20 1533 03/08/20 1533 03/08/20 1544 03/08/20 2010 03/09/20 0902 03/09/20 0902 03/09/20 1119 03/09/20 1713 03/09/20 2337 03/10/20 0449  NA 135   < > 136   < > 139   < > 145   < > 145 149* 153* 155*  K 4.0   < > 4.0  --  3.9  --  4.7  --   --   --   --  4.7  CL 102  --  103  --   --   --  115*  --   --   --   --  126*  CO2 21*  --  20*  --   --   --  17*  --   --   --   --  22  GLUCOSE 251*  --  174*  --   --   --  201*  --   --   --   --  189*  BUN 13  --  13  --   --   --  10  --   --   --   --  9  CALCIUM 8.8*  --  8.7*  --   --   --  8.1*  --   --   --   --  8.6*  CREATININE 1.11*  --  1.02*  --   --   --  1.02*  --   --   --   --  0.97  GFRNONAA 59*  --  >60  --   --   --  >60  --   --   --   --  >60  GFRAA >60  --  >60  --   --   --  >60  --   --   --   --  >60   < > = values in this interval not displayed.    LIVER FUNCTION TESTS: Recent Labs    03/08/20 1151  BILITOT 0.4  AST 24  ALT 30  ALKPHOS 169*  PROT 7.1  ALBUMIN 3.4*    Assessment and Plan:  History of acute CVA s/p cerebral arteriogram with emergent mechanical thrombectomy of left MCA M1 occlusion achieving a TICI 2c revascularization via right femoral approach 03/08/2020 by Dr. Corliss Skains.  Patient is stable; intubated but currently weaning. She is tolerating minimal sedation. She follows some commands on the left, no movement on the right. Groin site is soft/unremarkable.   Further plans per Neurology/CCM/Neurosurgery.  NIR will continue  to follow.   Electronically Signed: Alwyn Ren, AGACNP-BC [phone] 03/10/2020, 10:07 AM   I spent a total of 25 Minutes at the the patient's bedside AND on the patient's hospital floor or unit, greater than 50% of which was counseling/coordinating care for CVA s/p revascularization

## 2020-03-10 NOTE — Progress Notes (Signed)
Initial Nutrition Assessment  DOCUMENTATION CODES:   Obesity unspecified  INTERVENTION:   Initiate tube feeding via OG tube: Vital High Protein at 45 ml/h (1080 ml per day) Prosource TF 45 ml daily   Provides 1120 kcal, 105 gm protein, 902 ml free water daily  TF regimen and propofol at current rate providing 1384 total kcal/day   NUTRITION DIAGNOSIS:   Inadequate oral intake related to inability to eat as evidenced by NPO status.  GOAL:   Provide needs based on ASPEN/SCCM guidelines  MONITOR:   TF tolerance, Labs  REASON FOR ASSESSMENT:   Consult, Ventilator Enteral/tube feeding initiation and management  ASSESSMENT:   Pt with PMH of IDDM, HLD, tobacco abuse admitted to OOH with stroke symptoms. Pt admitted to [facility] with L MCA stroke s/p unsuccessful revascularization.   Neurosurgery following. Pt remains on vent after IR unable to protect airway.   Patient is currently intubated on ventilator support MV: 7 L/min Temp (24hrs), Avg:99.3 F (37.4 C), Min:98.2 F (36.8 C), Max:100 F (37.8 C)  Propofol: 10 ml/hr (20 mcg) provides: 264 kcal  Medications reviewed and include: colace, SSI, 28 units lantus daily, MVI with minerals, miralax Fentanyl  Labs reviewed: Na 159 HgbA1C: 10.7 CBG's: 195-175-168   OG in place, tip gastric   Diet Order:   Diet Order            Diet NPO time specified  Diet effective now                 EDUCATION NEEDS:   No education needs have been identified at this time  Skin:  Skin Assessment: Reviewed RN Assessment  Last BM:  unknown  Height:   Ht Readings from Last 1 Encounters:  03/08/20 5' (1.524 m)    Weight:   Wt Readings from Last 1 Encounters:  03/08/20 81.8 kg    Ideal Body Weight:  45.4 kg  BMI:  Body mass index is 35.22 kg/m.  Estimated Nutritional Needs:   Kcal:  1200  Protein:  >90 grams  Fluid:  > 1.5 L/day  Cammy Copa., RD, LDN, CNSC See AMiON for contact information

## 2020-03-10 NOTE — Progress Notes (Addendum)
NAME:  Deborah [name], MRN:  [id number], DOB:  01/09/1971, LOS: 2 ADMISSION DATE:  03/08/2020, CONSULTATION DATE:  03/08/20 REFERRING MD:  Neurology, CHIEF COMPLAINT:  CVA    Brief History   49 y/o female with history of IDDM, HLD, tobacco use disorder presented to [facility] for facial droop, right sided weakness and global aphasia. Found to have LVO of left M1 segment and was transferred to [facility] for attempt at endovascular revascularization.   History of present illness   49 y/o female presented after being found by her husband this morning with facial droop, right sided weakness, and global aphasia. LKN was 8:00 pm the night before. NIH score of 21 on arrival. She was immediately taken to IR and intubated for attempt at endovascular revascularization. Unfortunately, she had immediate reocclusion after achieving TICI 2c revascularization. Rescue stent not performed due to increased risk of ICH. She was transferred to the ICU for continued monitoring post-procedure.   Past Medical History  IDDM HLD Tobacco use Migraine headaches Chronic pain  Anxiety   Significant Hospital Events   9/21 > intubated for IR procedure 9/21> unsuccessful revascularization 9/21> admitted to neuro ICU  Consults:  PCCM Neurosurgery   Procedures:  9/21>cerebral arteriogram with emergent mechanical thrombectomy   Significant Diagnostic Tests:  9/21 CT head > Acute left MCA territory infarct without hemorrhagic conversion or progression from CT earlier today. Aspects remains 6  9/22 at 1 am CT head >Continued interval evolution of large left MCA territory infarct, overall similar in size and distribution from previous. Associated regional mass effect with partial effacement of the left lateral ventricle has mildly worsened, with worsened left-to-right midline shift now measuring up to 5 mm 9/22 at 7:50 am CT head > Unchanged extent of a large acute/early subacute left MCA vascular territory  infarct and associated hemorrhage as compared to the head CT performed earlier the same day at 12:57 a.m. Continued slight interval increase in mass effect with partial effacement of the left lateral ventricle and now 6 mm rightward midline shift (previously 5 mm). The basal cisterns remain patent. No evidence of ventricular entrapment. 9/22 MRI brain > Large territory infarct left MCA territory with moderate hemorrhage and mild subarachnoid hemorrhage. There is edema and mass-effect with 7 mm midline shift to the right. Left MCA is occluded in the M1 segment distal to an early temporal branch. No other intracranial stenosis  Micro Data:  N/A  Antimicrobials:  N/A   Interim history/subjective:   MRI yesterday evening showed overall stable cerebral edema. Na improved to 155; hypertonic saline decreased to 50 cc/hr. Clinically unchanged.  Currently on PS 10/8.   Objective   Blood pressure 131/62, pulse 66, temperature 99.7 F (37.6 C), resp. rate 15, height 5' (1.524 m), weight 81.8 kg, SpO2 98 %.    Vent Mode: CPAP;PSV FiO2 (%):  [40 %-50 %] 40 % Set Rate:  [18 bmp] 18 bmp Vt Set:  [370 mL] 370 mL PEEP:  [8 cmH20] 8 cmH20 Pressure Support:  [10 cmH20] 10 cmH20 Plateau Pressure:  [15 cmH20-19 cmH20] 15 cmH20   Intake/Output Summary (Last 24 hours) at 03/10/2020 0827 Last data filed at 03/10/2020 0700 Gross per 24 hour  Intake 1925.22 ml  Output 1110 ml  Net 815.22 ml   Filed Weights   03/08/20 0923  Weight: 81.8 kg    Examination: General: arouses to voice, no apparent distress  HENT: PEERL Lungs: CTAB Cardiovascular: RRR; normal S1-S2 Abdomen: Soft, non-distended  Extremities: warm and  well perfused; no edema Neuro: Opens eyes to voice. Left gaze preference, but able to cross midline. Cough and gag present. Doesn't follow commands. Flaccid on right. Able to hold LUE against gravity. Will raise arm  spontaneous movements on left. Plantar reflex upgoing on right,  downgoing on left   Resolved Hospital Problem list     Assessment & Plan:  Left MCA stroke, unsuccessful re-vascularization -neurologically unchanged -MRI/MRA showed overall stable cerebral edema, no worsening hemorrhage -she has been on hypertonic saline with goal sodium of 150-155; most recent sodium 155, rate decreased to 50 cc/hr  -appreciate neurology and neurosurgery following -holding off on antiplatelet therapy given evidence of bleeding  -continue high intensity statin  -A1C 10.7; see glucose control below  -TTE without evidence of cardiac embolism  -Tele monitoring  -BP stable off cleviprex   Acute respiratory failure 2/2 airway protection -doing well on PS 8/5; unable to protect airway due to neurologic status  -PAD protocol with fentanyl, propofol -VAP bundle   IDDM with severe hyperglycemia -continue Lantus; SSI -currently NPO in the event of possible need for neurosurgical intervention; will likely need to increase regimen once diet is started  Migraines Chronic pain, anxiety -holding home medications (xanax, fioricet, gabapentin, flexeril, tramadol) to accurately monitor mental status -monitor for signs of withdrawal   Best practice:  Diet: NPO Pain/Anxiety/Delirium protocol (if indicated): fentanyl, propofol VAP protocol (if indicated): Y DVT prophylaxis: SCDs GI prophylaxis: protonix  Glucose control: SSI, Lantus Mobility: Bedrest Code Status: FULL Family Communication: pending Disposition: ICU  Labs   CBC: Recent Labs  Lab 03/08/20 1151 03/08/20 1523 03/08/20 1544 03/09/20 1119 03/10/20 0449  WBC 24.2*  --   --  18.7* 13.1*  NEUTROABS  --   --   --  14.7* 9.1*  HGB 14.5 14.3 14.3 12.1 11.4*  HCT 42.9 42.0 42.0 37.3 37.0  MCV 97.1  --   --  100.0 105.7*  PLT 383  --   --  284 284    Basic Metabolic Panel: Recent Labs  Lab 03/08/20 0914 03/08/20 0914 03/08/20 1151 03/08/20 1151 03/08/20 1523 03/08/20 1523 03/08/20 1533  03/08/20 1533 03/08/20 1544 03/08/20 2010 03/09/20 0902 03/09/20 1119 03/09/20 1713 03/09/20 2337 03/10/20 0449  NA 130*   < > 135   < > 138   < > 136   < > 139   < > 145 145 149* 153* 155*  K 5.3*   < > 4.0   < > 3.8  --  4.0  --  3.9  --  4.7  --   --   --  4.7  CL 101  --  102  --   --   --  103  --   --   --  115*  --   --   --  126*  CO2  --   --  21*  --   --   --  20*  --   --   --  17*  --   --   --  22  GLUCOSE 413*  --  251*  --   --   --  174*  --   --   --  201*  --   --   --  189*  BUN 16  --  13  --   --   --  13  --   --   --  10  --   --   --  9  CREATININE 0.80  --  1.11*  --   --   --  1.02*  --   --   --  1.02*  --   --   --  0.97  CALCIUM  --   --  8.8*  --   --   --  8.7*  --   --   --  8.1*  --   --   --  8.6*  MG  --   --  2.0  --   --   --   --   --   --   --   --   --   --   --   --   PHOS  --   --  3.6  --   --   --   --   --   --   --   --   --   --   --   --    < > = values in this interval not displayed.   GFR: Estimated Creatinine Clearance: 67.9 mL/min (by C-G formula based on SCr of 0.97 mg/dL). Recent Labs  Lab 03/08/20 1151 03/09/20 1119 03/10/20 0449  WBC 24.2* 18.7* 13.1*    Liver Function Tests: Recent Labs  Lab 03/08/20 1151  AST 24  ALT 30  ALKPHOS 169*  BILITOT 0.4  PROT 7.1  ALBUMIN 3.4*   No results for input(s): LIPASE, AMYLASE in the last 168 hours. No results for input(s): AMMONIA in the last 168 hours.  ABG    Component Value Date/Time   PHART 7.347 (L) 03/08/2020 1544   PCO2ART 39.2 03/08/2020 1544   PO2ART 103 03/08/2020 1544   HCO3 21.6 03/08/2020 1544   TCO2 23 03/08/2020 1544   ACIDBASEDEF 4.0 (H) 03/08/2020 1544   O2SAT 98.0 03/08/2020 1544     Coagulation Profile: Recent Labs  Lab 03/08/20 1151  INR 1.1    Cardiac Enzymes: No results for input(s): CKTOTAL, CKMB, CKMBINDEX, TROPONINI in the last 168 hours.  HbA1C: Hgb A1c MFr Bld  Date/Time Value Ref Range Status  03/09/2020 11:19 AM 10.7 (H) 4.8  - 5.6 % Final    Comment:    (NOTE) Pre diabetes:          5.7%-6.4%  Diabetes:              >6.4%  Glycemic control for   <7.0% adults with diabetes     CBG: Recent Labs  Lab 03/08/20 2320 03/09/20 0310 03/09/20 0826 03/09/20 1314 03/09/20 1516  GLUCAP 276* 266* 168* 225* 244*    Review of Systems:   Review of Systems  Unable to perform ROS: Intubated     Social History   reports that she has been smoking cigarettes. She has been smoking about 0.50 packs per day. She has never used smokeless tobacco. She reports previous alcohol use. She reports previous drug use.   Family History   Her family history includes Heart Problems in her father; Heart defect in her maternal grandmother and mother; Stroke in her paternal grandfather.   Allergies Allergies  Allergen Reactions  . Aspirin Shortness Of Breath  . Mushroom Extract Complex Swelling     Home Medications  Prior to Admission medications   Medication Sig Start Date End Date Taking? Authorizing Provider  acetaminophen (TYLENOL) 325 MG tablet Take 325 mg by mouth every 6 (six) hours as needed.   Yes [provider]  albuterol (VENTOLIN HFA) 108 (90 Base) MCG/ACT inhaler  Inhale 2 puffs into the lungs every 6 (six) hours as needed for wheezing or shortness of breath.    Yes [provider]  ALPRAZolam Prudy Feeler(XANAX) 0.5 MG tablet Take 0.5 mg by mouth 3 (three) times daily as needed for anxiety. 1 tablet by mouth TID PRN 06/20/16  Yes [provider]  atorvastatin (LIPITOR) 20 MG tablet Take 20 mg by mouth daily. 04/18/16  Yes [provider]  Butalbital-APAP-Caffeine 50-325-40 MG capsule Take 1 capsule by mouth 2 (two) times daily as needed for headache.  04/18/16  Yes [provider]  cyclobenzaprine (FLEXERIL) 5 MG tablet Take 5 mg by mouth 3 (three) times daily.  06/20/16  Yes [provider]  diphenhydrAMINE (BENADRYL) 25 mg capsule Take 25 mg by mouth daily.   Yes [provider]  glimepiride (AMARYL) 2 MG tablet Take 2 mg by mouth daily with breakfast.   Yes [provider]  insulin glargine (LANTUS SOLOSTAR) 100 UNIT/ML Solostar Pen Inject 28 Units into the skin daily.  07/10/16  Yes [provider]  liraglutide (VICTOZA) 18 MG/3ML SOPN Inject 1.8 mg into the skin daily. 1.8 daily     Yes [provider]  Phenyleph-Doxylamine-DM-APAP (NYQUIL SEVERE COLD/FLU) 5-6.25-10-325 MG/15ML LIQD Take 15 mLs by mouth every 4 (four) hours as needed (cough).   Yes [provider]  promethazine (PHENERGAN) 25 MG tablet Take 25 mg by mouth 3 (three) times daily as needed for nausea (dizziness).  06/28/16  Yes [provider]  traMADol (ULTRAM) 50 MG tablet Take 50-100 mg by mouth every 8 (eight) hours as needed for moderate pain.  07/10/16  Yes [provider]  clopidogrel (PLAVIX) 75 MG tablet Take 1 tablet (75 mg total) by mouth daily. 10/29/19   Georgeanna LeaKrasowski, Robert J, MD  Erenumab-aooe 70 MG/ML SOAJ Inject into the skin. Once Monthly Patient not taking: Reported on 03/08/2020    [provider]  glucose blood (ACCU-CHEK AVIVA PLUS) test strip USE 1 STRIP UTD 07/08/16   [provider]  Insulin Pen Needle (NOVOFINE) 32G X 6 MM MISC USE BID 07/04/16   [provider]  Lancets (ACCU-CHEK MULTICLIX) lancets U TID 07/03/16   [provider]  levofloxacin (LEVAQUIN) 500 MG tablet Take by mouth. Patient not taking: Reported on 03/08/2020 08/27/16   [provider]   Lenward ChancellorBloomfield, Lashala Laser DO, DO IMTS, PGY-3 03/10/20, 8:50 am

## 2020-03-10 NOTE — Progress Notes (Signed)
OT Cancellation Note  Patient Details Name: Onedia Vargus MRN: [id number] DOB: [birth year]   Cancelled Treatment:    Reason Eval/Treat Not Completed: Patient not medically ready  Burnett Corrente Chereese Cilento, OT/L   Acute OT Clinical Specialist Acute Rehabilitation Services Pager [phone] Office [phone]  03/10/2020, 6:31 AM

## 2020-03-10 NOTE — Progress Notes (Signed)
Subjective: NAEs o/n  Objective: Vital signs in last 24 hours: Temp:  [97.9 F (36.6 C)-100 F (37.8 C)] 99.1 F (37.3 C) (09/23 0900) Pulse Rate:  [64-98] 66 (09/23 0900) Resp:  [11-36] 13 (09/23 0900) BP: (101-156)/(52-101) 140/59 (09/23 0900) SpO2:  [95 %-100 %] 97 % (09/23 0900) FiO2 (%):  [40 %-50 %] 40 % (09/23 0725)  Intake/Output from previous day: 09/22 0701 - 09/23 0700 In: 2012.1 [I.V.:2012.1] Out: 1110 [Urine:1110] Intake/Output this shift: Total I/O In: 64.8 [I.V.:64.8] Out: 150 [Urine:150]  Awake, alert, regards, eyes open spontaneously. PERRL. Purposeful with left side, does not follow commands No movement right side  Lab Results: Recent Labs    03/09/20 1119 03/10/20 0449  WBC 18.7* 13.1*  HGB 12.1 11.4*  HCT 37.3 37.0  PLT 284 284   BMET Recent Labs    03/09/20 0902 03/09/20 1119 03/09/20 2337 03/10/20 0449  NA 145   < > 153* 155*  K 4.7  --   --  4.7  CL 115*  --   --  126*  CO2 17*  --   --  22  GLUCOSE 201*  --   --  189*  BUN 10  --   --  9  CREATININE 1.02*  --   --  0.97  CALCIUM 8.1*  --   --  8.6*   < > = values in this interval not displayed.    Studies/Results: CT HEAD WO CONTRAST  Result Date: 03/09/2020 CLINICAL DATA:  Parenchymal hemorrhage, follow-up. EXAM: CT HEAD WITHOUT CONTRAST TECHNIQUE: Contiguous axial images were obtained from the base of the skull through the vertex without intravenous contrast. COMPARISON:  Prior head CT examinations 03/09/2020 at 12:58 a.m. and earlier. FINDINGS: Brain: Continued interval evolution of cytotoxic edema throughout much of the left MCA vascular territory consistent with acute/early subacute infarction, not significantly changed in extent as compared to the most recent prior head CT. Continued slight interval increase in mass effect with partial effacement of the left lateral ventricle and now with 6 mm rightward midline shift (previously 5 mm). The basal cisterns remain patent. No  evidence of ventricular entrapment. Unchanged small foci of parenchymal hemorrhage within the left basal ganglia. Unchanged appearance of additional scattered hemorrhage at the level of the left sylvian fissure and left frontal operculum. No interval hemorrhage is identified. No new large vessel territory infarct. Vascular: Redemonstrated hyperdensity within the M1 left MCA and proximal left M2 branches consistent with known reocclusion/thrombus. Skull: Normal. Negative for fracture or focal lesion. Sinuses/Orbits: Visualized orbits show no acute finding. Moderate ethmoid sinus mucosal thickening small amount of frothy secretions within the bilateral sphenoid sinuses. No significant mastoid effusion. IMPRESSION: Unchanged extent of a large acute/early subacute left MCA vascular territory infarct and associated hemorrhage as compared to the head CT performed earlier the same day at 12:57 a.m. Continued slight interval increase in mass effect with partial effacement of the left lateral ventricle and now 6 mm rightward midline shift (previously 5 mm). The basal cisterns remain patent. No evidence of ventricular entrapment. Persistent hyperdensity within the M1 left middle cerebral artery and proximal left MCA branches consistent with known reocclusion/thrombus. Electronically Signed   By: Jackey LogeKyle  Golden DO   On: 03/09/2020 08:27   CT HEAD WO CONTRAST  Result Date: 03/09/2020 CLINICAL DATA:  Follow-up examination for parenchymal hemorrhage. EXAM: CT HEAD WITHOUT CONTRAST TECHNIQUE: Contiguous axial images were obtained from the base of the skull through the vertex without intravenous contrast. COMPARISON:  Prior CT from 03/08/2020. FINDINGS: Brain: Continued interval evolution of cytotoxic edema throughout much of the left MCA territory, overall similar in size and distribution from previous. Scattered foci of superimposed parenchymal hemorrhage at the left lentiform nucleus are relatively unchanged. Additional  scattered hemorrhage at the level of the left sylvian fissure and operculum has somewhat dispersed since previous, now less defined and less dense as compared to previous. No definite new hemorrhage. Regional mass effect with partial effacement of the left lateral ventricle has mildly worsened. Associated left-to-right midline shift now measures up to 5 mm. No other new large vessel territory infarct. No extra-axial fluid collection. Vascular: Persistent hyperdensity within the left M1 segment and proximal left MCA branches, consistent with reocclusion/thrombus. Skull: Scalp soft tissues within normal limits.  Calvarium intact. Sinuses/Orbits: Globes and orbital soft tissues demonstrate no acute finding. Scattered mucosal thickening noted within the ethmoidal air cells. Paranasal sinuses are otherwise clear. No mastoid effusion. Other: None. IMPRESSION: 1. Continued interval evolution of large left MCA territory infarct, overall similar in size and distribution from previous. Associated regional mass effect with partial effacement of the left lateral ventricle has mildly worsened, with worsened left-to-right midline shift now measuring up to 5 mm. 2. Scattered foci of superimposed parenchymal hemorrhage within the area of infarction, stable to slightly decreased from previous. A portion of the apparent improvement in this finding may reflect interval clearing of contrast staining. 3. Persistent hyperdensity within the left M1 segment and proximal left MCA branches, consistent with reocclusion/thrombus. 4. No other new acute intracranial abnormality. Electronically Signed   By: Rise Mu M.D.   On: 03/09/2020 01:51   CT HEAD WO CONTRAST  Result Date: 03/08/2020 CLINICAL DATA:  Stroke, follow-up. EXAM: CT HEAD WITHOUT CONTRAST TECHNIQUE: Contiguous axial images were obtained from the base of the skull through the vertex without intravenous contrast. COMPARISON:  Noncontrast head CT examine age performed  earlier the same day 03/08/2020 at 7:57 a.m. and 10:26 a.m., CT angiogram head/neck 03/08/2020, neuro interventional procedural note from earlier the same day. FINDINGS: Brain: As compared to the head CT performed earlier the same day at 7:57 a.m., there has been significant interval progression of acute ischemic infarction changes within the left MCA vascular territory affecting portions of the left frontal lobe, parietal lobe, temporal lobe and left insula. The infarct is now large. Small foci of parenchymal hemorrhage within the left basal ganglia do not appear significantly changed from the head CT performed earlier the same day at 10:26 a.m. However, slightly increased from this prior examination, there has been a slight interval increase in a small to moderate amount of hemorrhage and/or contrast within the left perisylvian region and more superiorly along the left cerebral convexity. There is increased mass effect with increased partial effacement of the left lateral ventricle and now 4 mm rightward midline shift. Vascular: Residual circulating contrast limits evaluation for abnormal vessel hyperdensity Skull: Normal. Negative for fracture or focal lesion. Sinuses/Orbits: Visualized orbits show no acute finding. Mild ethmoid sinus mucosal thickening. Small right ethmoid sinus osteoma. No significant mastoid effusion. These results will be called to the ordering clinician or representative by the Radiologist Assistant, and communication documented in the PACS or [employer]. IMPRESSION: Significantly increased acute ischemic infarction changes within the left MCA vascular territory as compared to the head CT performed earlier the same day at 7:57 a.m. The left MCA infarct is now large. Increased mass effect and partial effacement of the left lateral ventricle now with 4 mm  rightward midline shift. Small foci of parenchymal hemorrhage within the left basal ganglia do not appear significantly changed from  the head CT performed earlier the same day at 10:26 a.m. However, small to moderate volume hemorrhage and/or contrast within the left perisylvian region and more superiorly along the left cerebral convexity has slightly increased since this prior study. Electronically Signed   By: Jackey Loge DO   On: 03/08/2020 18:18   MR ANGIO HEAD WO CONTRAST  Result Date: 03/09/2020 CLINICAL DATA:  Stroke follow-up EXAM: MRI HEAD WITHOUT CONTRAST MRA HEAD WITHOUT CONTRAST TECHNIQUE: Multiplanar, multiecho pulse sequences of the brain and surrounding structures were obtained without intravenous contrast. Angiographic images of the head were obtained using MRA technique without contrast. COMPARISON:  CT head 03/09/2020 FINDINGS: MRI HEAD FINDINGS Brain: Large area of acute infarct left MCA territory appears similar in size to the CT earlier today. There is restricted diffusion throughout the left basal ganglia as well as in the left frontal and parietal lobe. The insula and superior temporal lobe is involved. There is moderate hemorrhage within the infarct as well as some mild subarachnoid hemorrhage, similar to the prior CT. 7 mm midline shift to the right. No hydrocephalus. No evidence of chronic ischemic change. Vascular: Normal arterial flow voids. Skull and upper cervical spine: Negative Sinuses/Orbits: Mucosal edema paranasal sinuses.  Negative orbit Other: None MRA HEAD FINDINGS Both vertebral arteries widely patent. PICA patent bilaterally. Basilar widely patent. AICA, superior cerebellar, posterior cerebral arteries widely patent. Short segment signal loss anterior genu right internal carotid artery probably artifact. This vessel appears patent on CTA. Right middle cerebral artery patent. Anterior cerebral arteries widely patent Early branch of the left MCA is patent which appears to supply the temporal lobe. The left middle cerebral artery is occluded distal to this early branch. IMPRESSION: 1. Large territory  infarct left MCA territory with moderate hemorrhage and mild subarachnoid hemorrhage. There is edema and mass-effect with 7 mm midline shift to the right. 2. Left MCA is occluded in the M1 segment distal to an early temporal branch. No other intracranial stenosis. Electronically Signed   By: Marlan Palau M.D.   On: 03/09/2020 18:50   MR BRAIN WO CONTRAST  Result Date: 03/09/2020 CLINICAL DATA:  Stroke follow-up EXAM: MRI HEAD WITHOUT CONTRAST MRA HEAD WITHOUT CONTRAST TECHNIQUE: Multiplanar, multiecho pulse sequences of the brain and surrounding structures were obtained without intravenous contrast. Angiographic images of the head were obtained using MRA technique without contrast. COMPARISON:  CT head 03/09/2020 FINDINGS: MRI HEAD FINDINGS Brain: Large area of acute infarct left MCA territory appears similar in size to the CT earlier today. There is restricted diffusion throughout the left basal ganglia as well as in the left frontal and parietal lobe. The insula and superior temporal lobe is involved. There is moderate hemorrhage within the infarct as well as some mild subarachnoid hemorrhage, similar to the prior CT. 7 mm midline shift to the right. No hydrocephalus. No evidence of chronic ischemic change. Vascular: Normal arterial flow voids. Skull and upper cervical spine: Negative Sinuses/Orbits: Mucosal edema paranasal sinuses.  Negative orbit Other: None MRA HEAD FINDINGS Both vertebral arteries widely patent. PICA patent bilaterally. Basilar widely patent. AICA, superior cerebellar, posterior cerebral arteries widely patent. Short segment signal loss anterior genu right internal carotid artery probably artifact. This vessel appears patent on CTA. Right middle cerebral artery patent. Anterior cerebral arteries widely patent Early branch of the left MCA is patent which appears to supply the temporal lobe.  The left middle cerebral artery is occluded distal to this early branch. IMPRESSION: 1. Large  territory infarct left MCA territory with moderate hemorrhage and mild subarachnoid hemorrhage. There is edema and mass-effect with 7 mm midline shift to the right. 2. Left MCA is occluded in the M1 segment distal to an early temporal branch. No other intracranial stenosis. Electronically Signed   By: Marlan Palau M.D.   On: 03/09/2020 18:50   DG Chest Port 1 View  Result Date: 03/09/2020 CLINICAL DATA:  Hypoxia EXAM: PORTABLE CHEST 1 VIEW COMPARISON:  March 08, 2020 FINDINGS: Endotracheal tube tip is at the origin of the right main bronchus. Nasogastric tube tip and side port in stomach. No pneumothorax. There is atelectatic change in the left base. Lungs otherwise are clear. Heart is upper normal in size with pulmonary vascularity normal. No adenopathy. No bone lesions. IMPRESSION: Endotracheal tube tip in proximal right main bronchus. Advise withdrawing endotracheal tube approximately 3.5 cm. Atelectatic change left base. Lungs otherwise clear. No pneumothorax. Stable cardiac silhouette. Critical Value/emergent results were called by telephone at the time of interpretation on 03/09/2020 at 8:01 am to provider Philbert Riser, RN, who verbally acknowledged these results. Electronically Signed   By: Bretta Bang III M.D.   On: 03/09/2020 08:01   DG Abd Portable 1V  Result Date: 03/09/2020 CLINICAL DATA:  Nasogastric tube placement EXAM: PORTABLE ABDOMEN - 1 VIEW COMPARISON:  None. FINDINGS: Nasogastric tube tip and side port are in the stomach. There is no bowel dilatation or air-fluid level to suggest bowel obstruction. No free air. Surgical clips noted in lower abdomen slightly to the left of midline. IMPRESSION: Nasogastric tube tip and side port in stomach. No bowel obstruction or free air evident on supine examination. Electronically Signed   By: Bretta Bang III M.D.   On: 03/09/2020 07:59   VAS [place] GROIN PSEUDOANEURYSM  Result Date: 03/09/2020  ARTERIAL PSEUDOANEURYSM  Exam: Right  groin Indications: Bleeding from access site, s/p cerebral arteriogram with mechanical thrombectomy via right femoral approach 03/08/2020. Performing Technologist: Gertie Fey MHA, RDMS, RVT, RDCS  Examination Guidelines: A complete evaluation includes B-mode imaging, spectral Doppler, color Doppler, and power Doppler as needed of all accessible portions of each vessel. Bilateral testing is considered an integral part of a complete examination. Limited examinations for reoccurring indications may be performed as noted. +------------+----------+---------+------+----------+ Right DuplexPSV (cm/s)Waveform PlaqueComment(s) +------------+----------+---------+------+----------+ Ext.Iliac      211    triphasic                 +------------+----------+---------+------+----------+ CFA            211    triphasic                 +------------+----------+---------+------+----------+ Prox SFA       -104   triphasic                 +------------+----------+---------+------+----------+ Right Vein comments: right common femoral vein is patent  Summary: No evidence of pseudoaneurysm, AVF or DVT  Diagnosing physician: Coral Else MD Electronically signed by Coral Else MD on 03/09/2020 at 9:35:09 PM.   --------------------------------------------------------------------------------    Final    ECHOCARDIOGRAM COMPLETE  Result Date: 03/09/2020    ECHOCARDIOGRAM REPORT   Patient Name:   Deborah [name] Date of Exam: 03/09/2020 Medical Rec #:  [id number]         Height:       60.0 in Accession #:    [id number]  Weight:       180.3 lb Date of Birth:  09-10-1970          BSA:          1.786 m Patient Age:    49 years          BP:           149/75 mmHg Patient Gender: F                 HR:           94 bpm. Exam Location:  Outpatient Procedure: 2D Echo Indications:    Stroke I163.9  History:        Patient has prior history of Echocardiogram examinations, most                 recent 11/13/2019. Risk  Factors:Dyslipidemia and Diabetes.  Sonographer:    Thurman Coyer RDCS (AE) Referring Phys: [id number] SRISHTI L BHAGAT IMPRESSIONS  1. Left ventricular ejection fraction, by estimation, is 60 to 65%. The left ventricle has normal function. The left ventricle has no regional wall motion abnormalities. Left ventricular diastolic parameters were normal.  2. Right ventricular systolic function is normal. The right ventricular size is normal. Tricuspid regurgitation signal is inadequate for assessing PA pressure.  3. The mitral valve is normal in structure. Trivial mitral valve regurgitation. No evidence of mitral stenosis.  4. The aortic valve is tricuspid. Aortic valve regurgitation is not visualized. No aortic stenosis is present.  5. The inferior vena cava is normal in size with greater than 50% respiratory variability, suggesting right atrial pressure of 3 mmHg. Comparison(s): No significant change from prior study. Conclusion(s)/Recommendation(s): No intracardiac source of embolism detected on this transthoracic study. A transesophageal echocardiogram is recommended to exclude cardiac source of embolism if clinically indicated. FINDINGS  Left Ventricle: Left ventricular ejection fraction, by estimation, is 60 to 65%. The left ventricle has normal function. The left ventricle has no regional wall motion abnormalities. The left ventricular internal cavity size was normal in size. There is  no left ventricular hypertrophy. Left ventricular diastolic parameters were normal. Right Ventricle: The right ventricular size is normal. No increase in right ventricular wall thickness. Right ventricular systolic function is normal. Tricuspid regurgitation signal is inadequate for assessing PA pressure. Left Atrium: Left atrial size was normal in size. Right Atrium: Right atrial size was normal in size. Pericardium: There is no evidence of pericardial effusion. Mitral Valve: The mitral valve is normal in structure. Trivial  mitral valve regurgitation. No evidence of mitral valve stenosis. Tricuspid Valve: The tricuspid valve is normal in structure. Tricuspid valve regurgitation is trivial. No evidence of tricuspid stenosis. Aortic Valve: The aortic valve is tricuspid. Aortic valve regurgitation is not visualized. No aortic stenosis is present. Pulmonic Valve: The pulmonic valve was not well visualized. Pulmonic valve regurgitation is not visualized. No evidence of pulmonic stenosis. Aorta: The aortic root is normal in size and structure. Venous: The inferior vena cava is normal in size with greater than 50% respiratory variability, suggesting right atrial pressure of 3 mmHg. IAS/Shunts: No atrial level shunt detected by color flow Doppler.  LEFT VENTRICLE PLAX 2D LVIDd:         3.70 cm  Diastology LVIDs:         2.70 cm  LV e' medial:    9.36 cm/s LV PW:         1.00 cm  LV E/e' medial:  11.2 LV IVS:  0.90 cm  LV e' lateral:   11.40 cm/s LVOT diam:     1.70 cm  LV E/e' lateral: 9.2 LV SV:         42 LV SV Index:   24 LVOT Area:     2.27 cm  RIGHT VENTRICLE RV S prime:     15.30 cm/s TAPSE (M-mode): 2.0 cm LEFT ATRIUM             Index       RIGHT ATRIUM          Index LA diam:        2.70 cm 1.51 cm/m  RA Area:     9.00 cm LA Vol (A2C):   25.5 ml 14.28 ml/m RA Volume:   14.70 ml 8.23 ml/m LA Vol (A4C):   24.4 ml 13.66 ml/m LA Biplane Vol: 25.7 ml 14.39 ml/m  AORTIC VALVE LVOT Vmax:   83.30 cm/s LVOT Vmean:  64.800 cm/s LVOT VTI:    0.186 m MITRAL VALVE MV Area (PHT): 4.06 cm     SHUNTS MV Decel Time: 187 msec     Systemic VTI:  0.19 m MV E velocity: 105.00 cm/s  Systemic Diam: 1.70 cm MV A velocity: 107.00 cm/s MV E/A ratio:  0.98 Jodelle Red MD Electronically signed by Jodelle Red MD Signature Date/Time: 03/09/2020/7:14:28 PM    Final    [place] EKG SITE RITE  Result Date: 03/09/2020 If Site Rite image not attached, placement could not be confirmed due to current cardiac  rhythm.   Assessment/Plan: 49 yo F with left MCA stroke with hemorrhagic transformation - would recommend repeat CT head in am; if stable, can consider starting lovenox for DVT prophylaxis - recommend holding antiplatelet agents for ~1-2 weeks given hemorrhagic transformation - continue neurochecks and sodium parameters - ok to advance diet  Bedelia Person 03/10/2020, 10:52 AM

## 2020-03-10 NOTE — Progress Notes (Signed)
Paged Neuro MD about sodium level reaching 155, goal 150-155. 3% rate currently at 75 ml/hr. Verbal order to decrease 3% to 50 ml/hr.

## 2020-03-10 NOTE — Progress Notes (Signed)
PT Cancellation Note  Patient Details Name: Deborah [name] MRN: [id number] DOB: 05/14/1971   Cancelled Treatment:    Reason Eval/Treat Not Completed: Medical issues which prohibited therapy.  Neurologist, Dr. Roda Shutters, recommends waiting until after tomorrow's repeat head CT and he can further advise on when it is appropriate for PT/OT to mobilize.  Thanks,  Corinna Capra, PT, DPT  Acute Rehabilitation [phone] pager #[phone] office       Deborah [name] 03/10/2020, 11:32 AM

## 2020-03-10 NOTE — Progress Notes (Signed)
STROKE TEAM PROGRESS NOTE   INTERVAL HISTORY No family at bedside. Pt open eyes on voice, tracking on the left, still global aphasia. Not following commands. Has intermittent left arm shaking vs. Shivering. Tmax 100. Will check EEG. Low suspicious for seizure. NSG on board, repeat CT in am. Overnight MRI showed left MCA occluded with large left MCA infarct with HT, MLS at 7mm.    Vitals:   03/10/20 0800 03/10/20 0830 03/10/20 0900 03/10/20 1115  BP: (!) 121/52  (!) 140/59   Pulse: 75 64 66 67  Resp: 11 11 13 12   Temp: 99.5 F (37.5 C) 99.3 F (37.4 C) 99.1 F (37.3 C) 99.1 F (37.3 C)  TempSrc: Bladder     SpO2: 97% 97% 97% 97%  Weight:      Height:       CBC:  Recent Labs  Lab 03/09/20 1119 03/10/20 0449  WBC 18.7* 13.1*  NEUTROABS 14.7* 9.1*  HGB 12.1 11.4*  HCT 37.3 37.0  MCV 100.0 105.7*  PLT 284 284   Basic Metabolic Panel:  Recent Labs  Lab 03/08/20 1151 03/08/20 1523 03/09/20 0902 03/09/20 1119 03/09/20 2337 03/10/20 0449  NA 135   < > 145   < > 153* 155*  K 4.0   < > 4.7  --   --  4.7  CL 102   < > 115*  --   --  126*  CO2 21*   < > 17*  --   --  22  GLUCOSE 251*   < > 201*  --   --  189*  BUN 13   < > 10  --   --  9  CREATININE 1.11*   < > 1.02*  --   --  0.97  CALCIUM 8.8*   < > 8.1*  --   --  8.6*  MG 2.0  --   --   --   --   --   PHOS 3.6  --   --   --   --   --    < > = values in this interval not displayed.   Lipid Panel:  Recent Labs  Lab 03/09/20 0244 03/09/20 0244 03/10/20 0449  CHOL 288*  --   --   TRIG 371*   < > 252*  HDL 40*  --   --   CHOLHDL 7.2  --   --   VLDL 74*  --   --   LDLCALC 174*  --   --    < > = values in this interval not displayed.   HgbA1c:  Recent Labs  Lab 03/09/20 1119  HGBA1C 10.7*   Urine Drug Screen:  Recent Labs  Lab 03/09/20 0300  LABOPIA NONE DETECTED  COCAINSCRNUR NONE DETECTED  LABBENZ POSITIVE*  AMPHETMU NONE DETECTED  THCU NONE DETECTED  LABBARB POSITIVE*    Alcohol Level No results  for input(s): ETH in the last 168 hours.  IMAGING past 24 hours MR ANGIO HEAD WO CONTRAST  Result Date: 03/09/2020 CLINICAL DATA:  Stroke follow-up EXAM: MRI HEAD WITHOUT CONTRAST MRA HEAD WITHOUT CONTRAST TECHNIQUE: Multiplanar, multiecho pulse sequences of the brain and surrounding structures were obtained without intravenous contrast. Angiographic images of the head were obtained using MRA technique without contrast. COMPARISON:  CT head 03/09/2020 FINDINGS: MRI HEAD FINDINGS Brain: Large area of acute infarct left MCA territory appears similar in size to the CT earlier today. There is restricted diffusion throughout the left basal ganglia  as well as in the left frontal and parietal lobe. The insula and superior temporal lobe is involved. There is moderate hemorrhage within the infarct as well as some mild subarachnoid hemorrhage, similar to the prior CT. 7 mm midline shift to the right. No hydrocephalus. No evidence of chronic ischemic change. Vascular: Normal arterial flow voids. Skull and upper cervical spine: Negative Sinuses/Orbits: Mucosal edema paranasal sinuses.  Negative orbit Other: None MRA HEAD FINDINGS Both vertebral arteries widely patent. PICA patent bilaterally. Basilar widely patent. AICA, superior cerebellar, posterior cerebral arteries widely patent. Short segment signal loss anterior genu right internal carotid artery probably artifact. This vessel appears patent on CTA. Right middle cerebral artery patent. Anterior cerebral arteries widely patent Early branch of the left MCA is patent which appears to supply the temporal lobe. The left middle cerebral artery is occluded distal to this early branch. IMPRESSION: 1. Large territory infarct left MCA territory with moderate hemorrhage and mild subarachnoid hemorrhage. There is edema and mass-effect with 7 mm midline shift to the right. 2. Left MCA is occluded in the M1 segment distal to an early temporal branch. No other intracranial  stenosis. Electronically Signed   By: Marlan Palau M.D.   On: 03/09/2020 18:50   MR BRAIN WO CONTRAST  Result Date: 03/09/2020 CLINICAL DATA:  Stroke follow-up EXAM: MRI HEAD WITHOUT CONTRAST MRA HEAD WITHOUT CONTRAST TECHNIQUE: Multiplanar, multiecho pulse sequences of the brain and surrounding structures were obtained without intravenous contrast. Angiographic images of the head were obtained using MRA technique without contrast. COMPARISON:  CT head 03/09/2020 FINDINGS: MRI HEAD FINDINGS Brain: Large area of acute infarct left MCA territory appears similar in size to the CT earlier today. There is restricted diffusion throughout the left basal ganglia as well as in the left frontal and parietal lobe. The insula and superior temporal lobe is involved. There is moderate hemorrhage within the infarct as well as some mild subarachnoid hemorrhage, similar to the prior CT. 7 mm midline shift to the right. No hydrocephalus. No evidence of chronic ischemic change. Vascular: Normal arterial flow voids. Skull and upper cervical spine: Negative Sinuses/Orbits: Mucosal edema paranasal sinuses.  Negative orbit Other: None MRA HEAD FINDINGS Both vertebral arteries widely patent. PICA patent bilaterally. Basilar widely patent. AICA, superior cerebellar, posterior cerebral arteries widely patent. Short segment signal loss anterior genu right internal carotid artery probably artifact. This vessel appears patent on CTA. Right middle cerebral artery patent. Anterior cerebral arteries widely patent Early branch of the left MCA is patent which appears to supply the temporal lobe. The left middle cerebral artery is occluded distal to this early branch. IMPRESSION: 1. Large territory infarct left MCA territory with moderate hemorrhage and mild subarachnoid hemorrhage. There is edema and mass-effect with 7 mm midline shift to the right. 2. Left MCA is occluded in the M1 segment distal to an early temporal branch. No other  intracranial stenosis. Electronically Signed   By: Marlan Palau M.D.   On: 03/09/2020 18:50   ECHOCARDIOGRAM COMPLETE  Result Date: 03/09/2020    ECHOCARDIOGRAM REPORT   Patient Name:   Deborah [name] Date of Exam: 03/09/2020 Medical Rec #:  [id number]         Height:       60.0 in Accession #:    [id number]        Weight:       180.3 lb Date of Birth:  23-Jul-1970          BSA:  1.786 m Patient Age:    49 years          BP:           149/75 mmHg Patient Gender: F                 HR:           94 bpm. Exam Location:  Outpatient Procedure: 2D Echo Indications:    Stroke I163.9  History:        Patient has prior history of Echocardiogram examinations, most                 recent 11/13/2019. Risk Factors:Dyslipidemia and Diabetes.  Sonographer:    Thurman Coyer RDCS (AE) Referring Phys: [id number] SRISHTI L BHAGAT IMPRESSIONS  1. Left ventricular ejection fraction, by estimation, is 60 to 65%. The left ventricle has normal function. The left ventricle has no regional wall motion abnormalities. Left ventricular diastolic parameters were normal.  2. Right ventricular systolic function is normal. The right ventricular size is normal. Tricuspid regurgitation signal is inadequate for assessing PA pressure.  3. The mitral valve is normal in structure. Trivial mitral valve regurgitation. No evidence of mitral stenosis.  4. The aortic valve is tricuspid. Aortic valve regurgitation is not visualized. No aortic stenosis is present.  5. The inferior vena cava is normal in size with greater than 50% respiratory variability, suggesting right atrial pressure of 3 mmHg. Comparison(s): No significant change from prior study. Conclusion(s)/Recommendation(s): No intracardiac source of embolism detected on this transthoracic study. A transesophageal echocardiogram is recommended to exclude cardiac source of embolism if clinically indicated. FINDINGS  Left Ventricle: Left ventricular ejection fraction, by estimation, is 60 to  65%. The left ventricle has normal function. The left ventricle has no regional wall motion abnormalities. The left ventricular internal cavity size was normal in size. There is  no left ventricular hypertrophy. Left ventricular diastolic parameters were normal. Right Ventricle: The right ventricular size is normal. No increase in right ventricular wall thickness. Right ventricular systolic function is normal. Tricuspid regurgitation signal is inadequate for assessing PA pressure. Left Atrium: Left atrial size was normal in size. Right Atrium: Right atrial size was normal in size. Pericardium: There is no evidence of pericardial effusion. Mitral Valve: The mitral valve is normal in structure. Trivial mitral valve regurgitation. No evidence of mitral valve stenosis. Tricuspid Valve: The tricuspid valve is normal in structure. Tricuspid valve regurgitation is trivial. No evidence of tricuspid stenosis. Aortic Valve: The aortic valve is tricuspid. Aortic valve regurgitation is not visualized. No aortic stenosis is present. Pulmonic Valve: The pulmonic valve was not well visualized. Pulmonic valve regurgitation is not visualized. No evidence of pulmonic stenosis. Aorta: The aortic root is normal in size and structure. Venous: The inferior vena cava is normal in size with greater than 50% respiratory variability, suggesting right atrial pressure of 3 mmHg. IAS/Shunts: No atrial level shunt detected by color flow Doppler.  LEFT VENTRICLE PLAX 2D LVIDd:         3.70 cm  Diastology LVIDs:         2.70 cm  LV e' medial:    9.36 cm/s LV PW:         1.00 cm  LV E/e' medial:  11.2 LV IVS:        0.90 cm  LV e' lateral:   11.40 cm/s LVOT diam:     1.70 cm  LV E/e' lateral: 9.2 LV SV:  42 LV SV Index:   24 LVOT Area:     2.27 cm  RIGHT VENTRICLE RV S prime:     15.30 cm/s TAPSE (M-mode): 2.0 cm LEFT ATRIUM             Index       RIGHT ATRIUM          Index LA diam:        2.70 cm 1.51 cm/m  RA Area:     9.00 cm LA Vol  (A2C):   25.5 ml 14.28 ml/m RA Volume:   14.70 ml 8.23 ml/m LA Vol (A4C):   24.4 ml 13.66 ml/m LA Biplane Vol: 25.7 ml 14.39 ml/m  AORTIC VALVE LVOT Vmax:   83.30 cm/s LVOT Vmean:  64.800 cm/s LVOT VTI:    0.186 m MITRAL VALVE MV Area (PHT): 4.06 cm     SHUNTS MV Decel Time: 187 msec     Systemic VTI:  0.19 m MV E velocity: 105.00 cm/s  Systemic Diam: 1.70 cm MV A velocity: 107.00 cm/s MV E/A ratio:  0.98 Jodelle Red MD Electronically signed by Jodelle Red MD Signature Date/Time: 03/09/2020/7:14:28 PM    Final     PHYSICAL EXAM    Temp:  [97.9 F (36.6 C)-100 F (37.8 C)] 99.1 F (37.3 C) (09/23 1115) Pulse Rate:  [64-98] 67 (09/23 1115) Resp:  [11-36] 12 (09/23 1115) BP: (101-156)/(52-101) 140/59 (09/23 0900) SpO2:  [95 %-100 %] 97 % (09/23 1115) FiO2 (%):  [40 %] 40 % (09/23 1115)  General - Well nourished, well developed, intubated on sedation.  Ophthalmologic - fundi not visualized due to noncooperation.  Cardiovascular - Regular rate and rhythm.  Neuro - intubated on sedation, eyes close but easily open on voice, still not following commands. With eye opening, eyes in left gaze preference but able to cross midline briefly, right gaze incomplete, not blinking to visual threat on the right, doll's eyes present, tracking on the left not on the right. Pupil right 4mm and left 2.21mm, under dark more prominent, consistent with left horner syndrome. Corneal reflex present, gag and cough present. Breathing over the vent.  Facial symmetry not able to test due to ET tube.  Tongue protrusion not cooperative. Able to hold LUE against gravity without drift, LLE mild withdraw to pain and able to hold knee flexion and foot in bed position without drift. RUE flaccid and RLE slight withdraw to pain but increased muscle tone, not able to hold on knee flexion and foot in bed position. DTR 1+ and no babinski. Sensation, coordination and gait not tested.   ASSESSMENT/PLAN [name] is a 49 y.o. female with history of difficulty to control DB, HLD, syncope presenting to [facility] with R hemiparesis, facial droop, left gaze, and global aphasia w/ glucose 400s where she was found to have L M1 occlusion and transferred to [facility] for IR.   Stroke:   L MCA infarct s/p IR w/ post procedure hemorrhage, infarct secondary to large vessel disease source  CT head [facility]) L MCA infarct w/ ASPECTS 6  CTA head & neck [facility]) L M2 cutoff  CT head L MCA territory infarct. ASPECTS 6.   CT perfusion 63 core L brain same as CT. 32 penumbra. 1.5 mismatch ratio.  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 Post IR hemorrhage.  CT head 9/21 1818 increased L MCA ischemic  infarct w/ mass effect and partial effacement L lateral ventrilc w/ 51mm midline shift. Small foci L BG HT same w/ increased L perisylvian and L cerebral convexity hemorrhage.   CT head 9/22 0151 evolution large L MCA infarct w/ slightly worse effacement and mass effect w/ 51mm midline shift. Scattered foci hemorrhage in infarct same w/ contrast clearing. L M1 and proximal L MCA branches w/ persistent hyperdensity.   CT head 9/22 0827  L MCA infarct and hemorrhages same. Increase in mass effect and effacement now 68mm midline shift. L M1 and proximal L MCA braches w/ persistent hyperdensity.   MRI 9/22 Large L MCA infarct w/ moderate infarct and SAH. Edema, 69mm midline shift.   MRA 9/22 L MCA 1 occlusion   CT head 9/24 pending   2D Echo EF 60-65%. No source of embolus   Recent Zio patch Normal w/ infrequent PACs, PVCs 01/07/2020  LDL 174   HgbA1c 10.7   VTE prophylaxis - SCDs   clopidogrel 75 mg daily prior to admission, now on No antithrombotic given post IR hemorrhage   Therapy recommendations:  pending   Disposition:  pending   Acute Respiratory Failure  Secondary to  stroke  Intubated for IR, left intubated post IR    On low dose sedation  CCM on board   Cerebral Edema  MRI 9/22 with MLS @ 28mm  CT head pending in am  On 3% protocol - @75 ->50  Goal Na 150-160  Na 133->144->145->149->153->155  NS onboard ) - no hemicrani yet  HOB > 30  Hold antiplatelets and keep NPO  Placed PICC  LUE shaking vs. Shivering  Tmax 100  EEG pending  Low suspicious for seizure  R Groin Firmness, improved  R distal pulse 1+  Groin Doppler R common femoral patent, no pseudoaneurysm, AVF or DVT.   Continue monitoring  Blood Pressure  Home meds:  None, no hx HTN  Stable . BP goal < 160 for now due to HT   . Off Cleviprex gtt  . Long-term BP goal normotensive  Hyperlipidemia  Home meds:  lipitor 20  LDL 174, goal < 70  On lipitor 80  Plan to continue statin at discharge  Diabetes type II Uncontrolled  Home meds:  Glimepiride , lantus 21, liraglutide 1.8  HgbA1c 10.7, goal < 7.0  CBGs  Hyperglycemia improved  SSI 0-15  On lantus 28  DM coordinator consulted  Dysphagia . Secondary to stroke . NPO except meds . on TF @ 45 via OG . cortrak requested . Speech on board  Tobacco abuse  Current smoker  Will provide smoking cessation when able    Other Stroke Risk Factors  UDS positive benzos and barb (done following IR procedure)   Obesity, Body mass index is 35.22 kg/m., recommend weight loss, diet and exercise as appropriate   Family hx stroke (paternal grandfather)  Migraines on fioricet  Other Active Problems  Leukocytosis WBC 24.2->18.7->13.1  Chronic pain, Anxiety  Hospital day # 2  This patient is critically ill due to left MCA large infarct with HT, cerebral edema, respiratory failure, LUE shaking, hyperglycemia and dysphagia and at significant risk of neurological worsening, death form brain herniation, recurrent stroke, hematoma expansion, heart failure, status epilepticus, sepsis,  aspiration. This patient's care requires constant monitoring of vital signs, hemodynamics, respiratory and cardiac monitoring, review of multiple databases, neurological assessment, discussion with family, other specialists and medical decision making of high complexity. I spent 35 minutes of neurocritical care time in  the care of this patient.   Marvel Plan, MD PhD Stroke Neurology 03/10/2020 11:39 AM   To contact Stroke Continuity provider, please refer to [email or web address]. After hours, contact General Neurology

## 2020-03-10 NOTE — Progress Notes (Signed)
Portable EEG completed, results pending. 

## 2020-03-11 ENCOUNTER — Inpatient Hospital Stay (HOSPITAL_COMMUNITY): Payer: Medicaid Other

## 2020-03-11 DIAGNOSIS — I63512 Cerebral infarction due to unspecified occlusion or stenosis of left middle cerebral artery: Secondary | ICD-10-CM | POA: Diagnosis not present

## 2020-03-11 DIAGNOSIS — I63319 Cerebral infarction due to thrombosis of unspecified middle cerebral artery: Secondary | ICD-10-CM | POA: Diagnosis not present

## 2020-03-11 DIAGNOSIS — Z978 Presence of other specified devices: Secondary | ICD-10-CM | POA: Diagnosis not present

## 2020-03-11 DIAGNOSIS — R509 Fever, unspecified: Secondary | ICD-10-CM

## 2020-03-11 DIAGNOSIS — I6389 Other cerebral infarction: Secondary | ICD-10-CM

## 2020-03-11 DIAGNOSIS — J9690 Respiratory failure, unspecified, unspecified whether with hypoxia or hypercapnia: Secondary | ICD-10-CM | POA: Diagnosis not present

## 2020-03-11 LAB — SODIUM
Sodium: 153 mmol/L — ABNORMAL HIGH (ref 135–145)
Sodium: 154 mmol/L — ABNORMAL HIGH (ref 135–145)
Sodium: 154 mmol/L — ABNORMAL HIGH (ref 135–145)
Sodium: 157 mmol/L — ABNORMAL HIGH (ref 135–145)

## 2020-03-11 LAB — BASIC METABOLIC PANEL
Anion gap: 7 (ref 5–15)
BUN: 12 mg/dL (ref 6–20)
CO2: 22 mmol/L (ref 22–32)
Calcium: 8.7 mg/dL — ABNORMAL LOW (ref 8.9–10.3)
Chloride: 126 mmol/L — ABNORMAL HIGH (ref 98–111)
Creatinine, Ser: 0.98 mg/dL (ref 0.44–1.00)
GFR calc Af Amer: >60 mL/min (ref >60)
GFR calc non Af Amer: >60 mL/min (ref >60)
Glucose, Bld: 170 mg/dL — ABNORMAL HIGH (ref 70–99)
Potassium: 3.7 mmol/L (ref 3.5–5.1)
Sodium: 155 mmol/L — ABNORMAL HIGH (ref 135–145)

## 2020-03-11 LAB — CBC WITH DIFFERENTIAL/PLATELET
Abs Immature Granulocytes: 0.1 10*3/uL — ABNORMAL HIGH (ref 0.00–0.07)
Basophils Absolute: 0.1 10*3/uL (ref 0.0–0.1)
Basophils Relative: 1 %
Eosinophils Absolute: 0.2 10*3/uL (ref 0.0–0.5)
Eosinophils Relative: 1 %
HCT: 35.1 % — ABNORMAL LOW (ref 36.0–46.0)
Hemoglobin: 10.5 g/dL — ABNORMAL LOW (ref 12.0–15.0)
Immature Granulocytes: 1 %
Lymphocytes Relative: 21 %
Lymphs Abs: 2.9 10*3/uL (ref 0.7–4.0)
MCH: 32.1 pg (ref 26.0–34.0)
MCHC: 29.9 g/dL — ABNORMAL LOW (ref 30.0–36.0)
MCV: 107.3 fL — ABNORMAL HIGH (ref 80.0–100.0)
Monocytes Absolute: 1 10*3/uL (ref 0.1–1.0)
Monocytes Relative: 8 %
Neutro Abs: 9.4 10*3/uL — ABNORMAL HIGH (ref 1.7–7.7)
Neutrophils Relative %: 68 %
Platelets: 257 10*3/uL (ref 150–400)
RBC: 3.27 MIL/uL — ABNORMAL LOW (ref 3.87–5.11)
RDW: 15.5 % (ref 11.5–15.5)
WBC: 13.7 10*3/uL — ABNORMAL HIGH (ref 4.0–10.5)
nRBC: 0 % (ref 0.0–0.2)

## 2020-03-11 LAB — GLUCOSE, CAPILLARY
Glucose-Capillary: 196 mg/dL — ABNORMAL HIGH (ref 70–99)
Glucose-Capillary: 205 mg/dL — ABNORMAL HIGH (ref 70–99)
Glucose-Capillary: 166 mg/dL — ABNORMAL HIGH (ref 70–99)
Glucose-Capillary: 191 mg/dL — ABNORMAL HIGH (ref 70–99)
Glucose-Capillary: 263 mg/dL — ABNORMAL HIGH (ref 70–99)
Glucose-Capillary: 237 mg/dL — ABNORMAL HIGH (ref 70–99)

## 2020-03-11 LAB — MAGNESIUM
Magnesium: 2.2 mg/dL (ref 1.7–2.4)
Magnesium: 2 mg/dL (ref 1.7–2.4)

## 2020-03-11 LAB — PHOSPHORUS
Phosphorus: 3.5 mg/dL (ref 2.5–4.6)
Phosphorus: 3.3 mg/dL (ref 2.5–4.6)

## 2020-03-11 LAB — TRIGLYCERIDES: Triglycerides: 227 mg/dL — ABNORMAL HIGH (ref <150)

## 2020-03-11 MED ORDER — LABETALOL HCL 5 MG/ML IV SOLN: 5.0000 mg | INTRAVENOUS | Status: DC | PRN

## 2020-03-11 MED ORDER — ENOXAPARIN SODIUM 40 MG/0.4ML ~~LOC~~ SOLN
40.0000 mg | SUBCUTANEOUS | Status: DC
  Administered 2020-03-11 – 2020-03-29 (×19): 40 mg via SUBCUTANEOUS
  Filled 2020-03-11 (×19): qty 0.4

## 2020-03-11 NOTE — Procedures (Signed)
Cortrak  Person Inserting Tube:  Osa Craver, RD Tube Type:  Cortrak - 43 inches Tube Location:  Right nare Initial Placement:  Stomach Secured by: Bridle Technique Used to Measure Tube Placement:  Documented cm marking at nare/ corner of mouth Cortrak Secured At:  60 cm Procedure Comments:  Cortrak Tube Team Note:  Consult received to place a Cortrak feeding tube.   No x-ray is required. RN may begin using tube.   If the tube becomes dislodged please keep the tube and contact the Cortrak team at [email or web address] ([email or web address]) for replacement.  If after hours and replacement cannot be delayed, place a NG tube and confirm placement with an abdominal x-ray.    Romelle Starcher MS, RDN, LDN, CNSC Registered Dietitian III Clinical Nutrition RD Pager and On-Call Pager Number Located in [place]

## 2020-03-11 NOTE — Progress Notes (Signed)
Subjective: NAEs o/n  Objective: Vital signs in last 24 hours: Temp:  [98.2 F (36.8 C)-101.3 F (38.5 C)] 101.3 F (38.5 C) (09/24 0800) Pulse Rate:  [62-96] 84 (09/24 0800) Resp:  [11-28] 16 (09/24 0800) BP: (116-159)/(49-81) 157/68 (09/24 0800) SpO2:  [96 %-100 %] 100 % (09/24 0800) FiO2 (%):  [40 %] 40 % (09/24 0800) Weight:  [84.4 kg] 84.4 kg (09/24 0500)  Intake/Output from previous day: 09/23 0701 - 09/24 0700 In: 2217.6 [I.V.:1529.1; NG/GT:688.5] Out: 900 [Urine:900] Intake/Output this shift: Total I/O In: 105.8 [I.V.:60.8; NG/GT:45] Out: -   Eyes open spontaneously, awake, regards.  Mimics on left.  Does not follow commands. Intubated.  Lab Results: Recent Labs    03/10/20 0449 03/11/20 0437  WBC 13.1* 13.7*  HGB 11.4* 10.5*  HCT 37.0 35.1*  PLT 284 257   BMET Recent Labs    03/10/20 0449 03/10/20 1320 03/10/20 2330 03/11/20 0437  NA 155*   < > 157* 155*  K 4.7  --   --  3.7  CL 126*  --   --  126*  CO2 22  --   --  22  GLUCOSE 189*  --   --  170*  BUN 9  --   --  12  CREATININE 0.97  --   --  0.98  CALCIUM 8.6*  --   --  8.7*   < > = values in this interval not displayed.    Studies/Results: CT HEAD WO CONTRAST  Result Date: 03/11/2020 CLINICAL DATA:  Stroke follow-up EXAM: CT HEAD WITHOUT CONTRAST TECHNIQUE: Contiguous axial images were obtained from the base of the skull through the vertex without intravenous contrast. COMPARISON:  Brain MRI from 2 days ago FINDINGS: Brain: Cytotoxic edema in the left MCA distribution affecting essentially the entire territory. There is progressive swelling with midline shift measuring up to 9 mm as compared to 6 mm previously. No entrapment or new infarct is seen. Petechial hemorrhage and small nodular foci of hemorrhage at the left basal ganglia have mildly regressed. Vascular: Pseudo hyperdense versus truly hyperdense left MCA branches. Skull: Negative Sinuses/Orbits: Unremarkable IMPRESSION: Acute left MCA  territory infarct with mildly progressive swelling (9 mm midline shift) and regressed hemorrhage. Electronically Signed   By: Marnee SpringJonathon  Watts M.D.   On: 03/11/2020 06:26   MR ANGIO HEAD WO CONTRAST  Result Date: 03/09/2020 CLINICAL DATA:  Stroke follow-up EXAM: MRI HEAD WITHOUT CONTRAST MRA HEAD WITHOUT CONTRAST TECHNIQUE: Multiplanar, multiecho pulse sequences of the brain and surrounding structures were obtained without intravenous contrast. Angiographic images of the head were obtained using MRA technique without contrast. COMPARISON:  CT head 03/09/2020 FINDINGS: MRI HEAD FINDINGS Brain: Large area of acute infarct left MCA territory appears similar in size to the CT earlier today. There is restricted diffusion throughout the left basal ganglia as well as in the left frontal and parietal lobe. The insula and superior temporal lobe is involved. There is moderate hemorrhage within the infarct as well as some mild subarachnoid hemorrhage, similar to the prior CT. 7 mm midline shift to the right. No hydrocephalus. No evidence of chronic ischemic change. Vascular: Normal arterial flow voids. Skull and upper cervical spine: Negative Sinuses/Orbits: Mucosal edema paranasal sinuses.  Negative orbit Other: None MRA HEAD FINDINGS Both vertebral arteries widely patent. PICA patent bilaterally. Basilar widely patent. AICA, superior cerebellar, posterior cerebral arteries widely patent. Short segment signal loss anterior genu right internal carotid artery probably artifact. This vessel appears patent on CTA. Right  middle cerebral artery patent. Anterior cerebral arteries widely patent Early branch of the left MCA is patent which appears to supply the temporal lobe. The left middle cerebral artery is occluded distal to this early branch. IMPRESSION: 1. Large territory infarct left MCA territory with moderate hemorrhage and mild subarachnoid hemorrhage. There is edema and mass-effect with 7 mm midline shift to the right.  2. Left MCA is occluded in the M1 segment distal to an early temporal branch. No other intracranial stenosis. Electronically Signed   By: Marlan Palau M.D.   On: 03/09/2020 18:50   MR BRAIN WO CONTRAST  Result Date: 03/09/2020 CLINICAL DATA:  Stroke follow-up EXAM: MRI HEAD WITHOUT CONTRAST MRA HEAD WITHOUT CONTRAST TECHNIQUE: Multiplanar, multiecho pulse sequences of the brain and surrounding structures were obtained without intravenous contrast. Angiographic images of the head were obtained using MRA technique without contrast. COMPARISON:  CT head 03/09/2020 FINDINGS: MRI HEAD FINDINGS Brain: Large area of acute infarct left MCA territory appears similar in size to the CT earlier today. There is restricted diffusion throughout the left basal ganglia as well as in the left frontal and parietal lobe. The insula and superior temporal lobe is involved. There is moderate hemorrhage within the infarct as well as some mild subarachnoid hemorrhage, similar to the prior CT. 7 mm midline shift to the right. No hydrocephalus. No evidence of chronic ischemic change. Vascular: Normal arterial flow voids. Skull and upper cervical spine: Negative Sinuses/Orbits: Mucosal edema paranasal sinuses.  Negative orbit Other: None MRA HEAD FINDINGS Both vertebral arteries widely patent. PICA patent bilaterally. Basilar widely patent. AICA, superior cerebellar, posterior cerebral arteries widely patent. Short segment signal loss anterior genu right internal carotid artery probably artifact. This vessel appears patent on CTA. Right middle cerebral artery patent. Anterior cerebral arteries widely patent Early branch of the left MCA is patent which appears to supply the temporal lobe. The left middle cerebral artery is occluded distal to this early branch. IMPRESSION: 1. Large territory infarct left MCA territory with moderate hemorrhage and mild subarachnoid hemorrhage. There is edema and mass-effect with 7 mm midline shift to the  right. 2. Left MCA is occluded in the M1 segment distal to an early temporal branch. No other intracranial stenosis. Electronically Signed   By: Marlan Palau M.D.   On: 03/09/2020 18:50   EEG adult  Result Date: 03/10/2020 Charlsie Quest, MD     03/10/2020  3:48 PM Patient Name: Deborah [name] MRN: [id number] Epilepsy Attending: Charlsie Quest Referring Physician/Provider: Dr Marvel Plan Date: 03/10/2020 Duration: 24.04 mins Patient history: 49yo F with  Left MCA infarct with R hemiparesis, facial droop, left gaze, and global aphasia. EEG to evaluate for seizure Level of alertness: comatose AEDs during EEG study: Propofol Technical aspects: This EEG study was done with scalp electrodes positioned according to the 10-20 International system of electrode placement. Electrical activity was acquired at a sampling rate of 500Hz  and reviewed with a high frequency filter of 70Hz  and a low frequency filter of 1Hz . EEG data were recorded continuously and digitally stored. Description: EEG showed continuous 3 to 6 Hz theta-delta slowing in left hemisphere as well as 8-10Hz  alpha activity in right hemisphere. Hyperventilation and photic stimulation were not performed.   ABNORMALITY -Continuous slow, lateralized left hemipshere IMPRESSION: This study is suggestive of cortical dysfunction in left hemisphere likely secondary to underlying infarct. No seizures or epileptiform discharges were seen throughout the recording. Priyanka O Yadav   VAS GROIN PSEUDOANEURYSM  Result  Date: 03/09/2020  ARTERIAL PSEUDOANEURYSM  Exam: Right groin Indications: Bleeding from access site, s/p cerebral arteriogram with mechanical thrombectomy via right femoral approach 03/08/2020. Performing Technologist: Gertie Fey MHA, RDMS, RVT, RDCS  Examination Guidelines: A complete evaluation includes B-mode imaging, spectral Doppler, color Doppler, and power Doppler as needed of all accessible portions of each vessel. Bilateral  testing is considered an integral part of a complete examination. Limited examinations for reoccurring indications may be performed as noted. +------------+----------+---------+------+----------+ Right DuplexPSV (cm/s)Waveform PlaqueComment(s) +------------+----------+---------+------+----------+ Ext.Iliac      211    triphasic                 +------------+----------+---------+------+----------+ CFA            211    triphasic                 +------------+----------+---------+------+----------+ Prox SFA       -104   triphasic                 +------------+----------+---------+------+----------+ Right Vein comments: right common femoral vein is patent  Summary: No evidence of pseudoaneurysm, AVF or DVT  Diagnosing physician: Coral Else MD Electronically signed by Coral Else MD on 03/09/2020 at 9:35:09 PM.   --------------------------------------------------------------------------------    Final    ECHOCARDIOGRAM COMPLETE  Result Date: 03/09/2020    ECHOCARDIOGRAM REPORT   Patient Name:   Deborah [name] Date of Exam: 03/09/2020 Medical Rec #:  [id number]         Height:       60.0 in Accession #:    [id number]        Weight:       180.3 lb Date of Birth:  July 17, 1970          BSA:          1.786 m Patient Age:    49 years          BP:           149/75 mmHg Patient Gender: F                 HR:           94 bpm. Exam Location:  Outpatient Procedure: 2D Echo Indications:    Stroke I163.9  History:        Patient has prior history of Echocardiogram examinations, most                 recent 11/13/2019. Risk Factors:Dyslipidemia and Diabetes.  Sonographer:    Thurman Coyer RDCS (AE) Referring Phys: [id number] SRISHTI L BHAGAT IMPRESSIONS  1. Left ventricular ejection fraction, by estimation, is 60 to 65%. The left ventricle has normal function. The left ventricle has no regional wall motion abnormalities. Left ventricular diastolic parameters were normal.  2. Right ventricular systolic  function is normal. The right ventricular size is normal. Tricuspid regurgitation signal is inadequate for assessing PA pressure.  3. The mitral valve is normal in structure. Trivial mitral valve regurgitation. No evidence of mitral stenosis.  4. The aortic valve is tricuspid. Aortic valve regurgitation is not visualized. No aortic stenosis is present.  5. The inferior vena cava is normal in size with greater than 50% respiratory variability, suggesting right atrial pressure of 3 mmHg. Comparison(s): No significant change from prior study. Conclusion(s)/Recommendation(s): No intracardiac source of embolism detected on this transthoracic study. A transesophageal echocardiogram is recommended to exclude cardiac source of embolism if clinically indicated. FINDINGS  Left Ventricle: Left ventricular ejection fraction,  by estimation, is 60 to 65%. The left ventricle has normal function. The left ventricle has no regional wall motion abnormalities. The left ventricular internal cavity size was normal in size. There is  no left ventricular hypertrophy. Left ventricular diastolic parameters were normal. Right Ventricle: The right ventricular size is normal. No increase in right ventricular wall thickness. Right ventricular systolic function is normal. Tricuspid regurgitation signal is inadequate for assessing PA pressure. Left Atrium: Left atrial size was normal in size. Right Atrium: Right atrial size was normal in size. Pericardium: There is no evidence of pericardial effusion. Mitral Valve: The mitral valve is normal in structure. Trivial mitral valve regurgitation. No evidence of mitral valve stenosis. Tricuspid Valve: The tricuspid valve is normal in structure. Tricuspid valve regurgitation is trivial. No evidence of tricuspid stenosis. Aortic Valve: The aortic valve is tricuspid. Aortic valve regurgitation is not visualized. No aortic stenosis is present. Pulmonic Valve: The pulmonic valve was not well visualized.  Pulmonic valve regurgitation is not visualized. No evidence of pulmonic stenosis. Aorta: The aortic root is normal in size and structure. Venous: The inferior vena cava is normal in size with greater than 50% respiratory variability, suggesting right atrial pressure of 3 mmHg. IAS/Shunts: No atrial level shunt detected by color flow Doppler.  LEFT VENTRICLE PLAX 2D LVIDd:         3.70 cm  Diastology LVIDs:         2.70 cm  LV e' medial:    9.36 cm/s LV PW:         1.00 cm  LV E/e' medial:  11.2 LV IVS:        0.90 cm  LV e' lateral:   11.40 cm/s LVOT diam:     1.70 cm  LV E/e' lateral: 9.2 LV SV:         42 LV SV Index:   24 LVOT Area:     2.27 cm  RIGHT VENTRICLE RV S prime:     15.30 cm/s TAPSE (M-mode): 2.0 cm LEFT ATRIUM             Index       RIGHT ATRIUM          Index LA diam:        2.70 cm 1.51 cm/m  RA Area:     9.00 cm LA Vol (A2C):   25.5 ml 14.28 ml/m RA Volume:   14.70 ml 8.23 ml/m LA Vol (A4C):   24.4 ml 13.66 ml/m LA Biplane Vol: 25.7 ml 14.39 ml/m  AORTIC VALVE LVOT Vmax:   83.30 cm/s LVOT Vmean:  64.800 cm/s LVOT VTI:    0.186 m MITRAL VALVE MV Area (PHT): 4.06 cm     SHUNTS MV Decel Time: 187 msec     Systemic VTI:  0.19 m MV E velocity: 105.00 cm/s  Systemic Diam: 1.70 cm MV A velocity: 107.00 cm/s MV E/A ratio:  0.98 Jodelle Red MD Electronically signed by Jodelle Red MD Signature Date/Time: 03/09/2020/7:14:28 PM    Final     Assessment/Plan: 49 yo F with left MCA CVA 3 days ago. - would recommend continuing hypertonic treatments until post-stroke day #5 - ok for lovenox for DVT prophylaxis - please call with questions   Bedelia Person 03/11/2020, 9:37 AM

## 2020-03-11 NOTE — Progress Notes (Signed)
PT Cancellation Note  Patient Details Name: Deborah [name] MRN: [id number] DOB: 23-Jan-1971   Cancelled Treatment:    Reason Eval/Treat Not Completed: Medical issues which prohibited therapy.  I checked in with neurologist Dr. Roda Shutters, who stated one more day of rest and re-check tomorrow to see if we can mobilize [name].  Corinna Capra, PT, DPT  Acute Rehabilitation [phone] pager #[phone] office       Rollene Rotunda [name] 03/11/2020, 2:27 PM

## 2020-03-11 NOTE — Progress Notes (Signed)
NAME:  Deborah [name], MRN:  [id number], DOB:  1970-08-28, LOS: 3 ADMISSION DATE:  03/08/2020, CONSULTATION DATE:  03/08/20 REFERRING MD:  Neurology, CHIEF COMPLAINT:  CVA    Brief History   49 y/o female with history of IDDM, HLD, tobacco use disorder presented to [facility] for facial droop, right sided weakness and global aphasia. Found to have LVO of left M1 segment and was transferred to [facility] for attempt at endovascular revascularization.   History of present illness   49 y/o female presented after being found by her husband this morning with facial droop, right sided weakness, and global aphasia. LKN was 8:00 pm the night before. NIH score of 21 on arrival. She was immediately taken to IR and intubated for attempt at endovascular revascularization. Unfortunately, she had immediate reocclusion after achieving TICI 2c revascularization. Rescue stent not performed due to increased risk of ICH. She was transferred to the ICU for continued monitoring post-procedure.   Past Medical History  IDDM HLD Tobacco use Migraine headaches Chronic pain  Anxiety   Significant Hospital Events   9/21 > intubated for IR procedure 9/21> unsuccessful revascularization 9/21> admitted to neuro ICU  Consults:  PCCM Neurosurgery   Procedures:  9/21>cerebral arteriogram with emergent mechanical thrombectomy   Significant Diagnostic Tests:  9/21 CT head > Acute left MCA territory infarct without hemorrhagic conversion or progression from CT earlier today. Aspects remains 6  9/22 at 1 am CT head >Continued interval evolution of large left MCA territory infarct, overall similar in size and distribution from previous. Associated regional mass effect with partial effacement of the left lateral ventricle has mildly worsened, with worsened left-to-right midline shift now measuring up to 5 mm 9/22 at 7:50 am CT head > Unchanged extent of a large acute/early subacute left MCA vascular territory  infarct and associated hemorrhage as compared to the head CT performed earlier the same day at 12:57 a.m. Continued slight interval increase in mass effect with partial effacement of the left lateral ventricle and now 6 mm rightward midline shift (previously 5 mm). The basal cisterns remain patent. No evidence of ventricular entrapment. 9/22 MRI brain > Large territory infarct left MCA territory with moderate hemorrhage and mild subarachnoid hemorrhage. There is edema and mass-effect with 7 mm midline shift to the right. Left MCA is occluded in the M1 segment distal to an early temporal branch. No other intracranial stenosis 9/24 cth: Acute left MCA territory infarct with mildly progressive swelling (9 mm midline shift) and regressed hemorrhage.   Micro Data:  N/A  Antimicrobials:  N/A   Interim history/subjective:   9/24: pt remains on vent on light sedation awake but able to utilize extremities to follow commands. Does hold eyes shut on command. Unable to perform tongue thrust. Pt and family would benefit from palliative consult, d/w Dr Roda Shutters (he will speak with pt's boyfriend who son is deferring decisions to). 3% stopped overnight 2/2 Na >155. Neurosx signing off despite some progressive shift on imaging.   Objective   Blood pressure (!) 140/57, pulse 72, temperature (!) 101.3 F (38.5 C), temperature source Axillary, resp. rate 15, height 5' (1.524 m), weight 84.4 kg, SpO2 97 %.    Vent Mode: PSV;CPAP FiO2 (%):  [40 %] 40 % Set Rate:  [18 bmp] 18 bmp Vt Set:  [370 mL] 370 mL PEEP:  [8 cmH20] 8 cmH20 Pressure Support:  [10 cmH20] 10 cmH20 Plateau Pressure:  [14 cmH20-17 cmH20] 14 cmH20   Intake/Output Summary (Last 24 hours)  at 03/11/2020 1207 Last data filed at 03/11/2020 1100 Gross per 24 hour  Intake 2316.15 ml  Output 750 ml  Net 1566.15 ml   Filed Weights   03/08/20 0923 03/11/20 0500  Weight: 81.8 kg 84.4 kg    Examination: General: arouses to voice, no apparent  distress  HENT: PEERL, mmmp eomi Lungs: CTAB Cardiovascular: RRR; normal S1-S2 Abdomen: Soft, non-distended  Extremities: warm and well perfused; no edema Neuro: Opens eyes to voice. Left gaze preference, but able to cross midline. Cough and gag present. Doesn't follow commands. Flaccid on right. Able to hold LUE against gravity. Will raise arm  spontaneous movements on left. Plantar reflex upgoing on right, downgoing on left   Resolved Hospital Problem list     Assessment & Plan:  Left MCA stroke, unsuccessful re-vascularization -neurologically unchanged -cth today with some mild interval increase in midline shift -she has been on hypertonic saline with goal sodium of 150-155; held for now (neurosx recs to continue hypertonic therapies until post stroke day #5) -appreciate neurology -antiplt and chemoprophy per neuro (neurosx has ok'd chemoprophy today) -stain -A1C 10.7; see glucose control below  -TTE without evidence of cardiac embolism  -Tele monitoring   Acute respiratory failure 2/2 airway protection -tolerating some ps 8/5; likely unable to protect airway due to neurologic status  -PAD protocol with fentanyl, propofol -VAP bundle   IDDM with severe hyperglycemia -continue Lantus; SSI -tf tolerating  Migraines Chronic pain, anxiety -holding home medications (xanax, fioricet, gabapentin, flexeril, tramadol) to accurately monitor mental status -monitor for signs of withdrawal   Best practice:  Diet: NPO Pain/Anxiety/Delirium protocol (if indicated): fentanyl, propofol VAP protocol (if indicated): Y DVT prophylaxis: SCDs GI prophylaxis: protonix  Glucose control: SSI, Lantus Mobility: Bedrest Code Status: FULL Family Communication: per primary Disposition: ICU  Labs   CBC: Recent Labs  Lab 03/08/20 1151 03/08/20 1151 03/08/20 1523 03/08/20 1544 03/09/20 1119 03/10/20 0449 03/11/20 0437  WBC 24.2*  --   --   --  18.7* 13.1* 13.7*  NEUTROABS  --   --    --   --  14.7* 9.1* 9.4*  HGB 14.5   < > 14.3 14.3 12.1 11.4* 10.5*  HCT 42.9   < > 42.0 42.0 37.3 37.0 35.1*  MCV 97.1  --   --   --  100.0 105.7* 107.3*  PLT 383  --   --   --  284 284 257   < > = values in this interval not displayed.    Basic Metabolic Panel: Recent Labs  Lab 03/08/20 1151 03/08/20 1523 03/08/20 1533 03/08/20 1533 03/08/20 1544 03/08/20 2010 03/09/20 0902 03/09/20 1119 03/10/20 0449 03/10/20 1320 03/10/20 1816 03/10/20 2330 03/11/20 0437  NA 135   < > 136   < > 139   < > 145   < > 155* 159* 160* 157* 155*  K 4.0   < > 4.0  --  3.9  --  4.7  --  4.7  --   --   --  3.7  CL 102  --  103  --   --   --  115*  --  126*  --   --   --  126*  CO2 21*  --  20*  --   --   --  17*  --  22  --   --   --  22  GLUCOSE 251*  --  174*  --   --   --  201*  --  189*  --   --   --  170*  BUN 13  --  13  --   --   --  10  --  9  --   --   --  12  CREATININE 1.11*  --  1.02*  --   --   --  1.02*  --  0.97  --   --   --  0.98  CALCIUM 8.8*  --  8.7*  --   --   --  8.1*  --  8.6*  --   --   --  8.7*  MG 2.0  --   --   --   --   --   --   --   --   --  2.4  --  2.2  PHOS 3.6  --   --   --   --   --   --   --   --   --  2.9  --  3.5   < > = values in this interval not displayed.   GFR: Estimated Creatinine Clearance: 68.5 mL/min (by C-G formula based on SCr of 0.98 mg/dL). Recent Labs  Lab 03/08/20 1151 03/09/20 1119 03/10/20 0449 03/11/20 0437  WBC 24.2* 18.7* 13.1* 13.7*    Liver Function Tests: Recent Labs  Lab 03/08/20 1151  AST 24  ALT 30  ALKPHOS 169*  BILITOT 0.4  PROT 7.1  ALBUMIN 3.4*   No results for input(s): LIPASE, AMYLASE in the last 168 hours. No results for input(s): AMMONIA in the last 168 hours.  ABG    Component Value Date/Time   PHART 7.347 (L) 03/08/2020 1544   PCO2ART 39.2 03/08/2020 1544   PO2ART 103 03/08/2020 1544   HCO3 21.6 03/08/2020 1544   TCO2 23 03/08/2020 1544   ACIDBASEDEF 4.0 (H) 03/08/2020 1544   O2SAT 98.0 03/08/2020  1544     Coagulation Profile: Recent Labs  Lab 03/08/20 1151  INR 1.1    Cardiac Enzymes: No results for input(s): CKTOTAL, CKMB, CKMBINDEX, TROPONINI in the last 168 hours.  HbA1C: Hgb A1c MFr Bld  Date/Time Value Ref Range Status  03/09/2020 11:19 AM 10.7 (H) 4.8 - 5.6 % Final    Comment:    (NOTE) Pre diabetes:          5.7%-6.4%  Diabetes:              >6.4%  Glycemic control for   <7.0% adults with diabetes     CBG: Recent Labs  Lab 03/10/20 1908 03/10/20 2304 03/11/20 0325 03/11/20 0754 03/11/20 1129  GLUCAP 196* 205* 166* 191* 263*   Critical care time: The patient is critically ill with multiple organ systems failure and requires high complexity decision making for assessment and support, frequent evaluation and titration of therapies, application of advanced monitoring technologies and extensive interpretation of multiple databases.  Critical care time 35 mins. This represents my time independent of the NPs time taking care of the pt. This is excluding procedures.    Briant Sites DO [facility] 03/11/2020, 12:07 PM

## 2020-03-11 NOTE — Progress Notes (Signed)
Inpatient Diabetes Program Recommendations  AACE/ADA: New Consensus Statement on Inpatient Glycemic Control (2015)  Target Ranges:  Prepandial:   less than 140 mg/dL      Peak postprandial:   less than 180 mg/dL (1-2 hours)      Critically ill patients:  140 - 180 mg/dL   Lab Results  Component Value Date   GLUCAP 263 (H) 03/11/2020   HGBA1C 10.7 (H) 03/09/2020    Review of Glycemic Control Results for Deborah [name], Deborah [name] (MRN [id number]) as of 03/11/2020 11:57  Ref. Range 03/10/2020 19:08 03/10/2020 23:04 03/11/2020 03:25 03/11/2020 07:54 03/11/2020 11:29  Glucose-Capillary Latest Ref Range: 70 - 99 mg/dL 446 (H) 286 (H) 381 (H) 191 (H) 263 (H)   Diabetes history:  DM2  Current orders for Inpatient glycemic control:  Vital HP 27ml/hr continous Novolog 0-15 units q4h Lantus 28 units qhs  Inpatient Diabetes Program Recommendations:    Novolog 2-3 units q4h tube feed coverage.  Stop is held or discontinued.  Will continue to follow while inpatient.  Thank you, Dulce Sellar, RN, BSN Diabetes Coordinator Inpatient Diabetes Program [phone] (team pager from 8a-5p)

## 2020-03-11 NOTE — Progress Notes (Signed)
STROKE TEAM PROGRESS NOTE   INTERVAL HISTORY RN at bedside. Pt still intubated, neuro remained stable unchanged. CT repeat showed again mild progression on cerebral edema and midline shift. NSG signed off. Pt had fever 101.4 this am. CCM consider that she likely needs trach. I called pt son and he agrees with all necessary procedure for aggressive care.   Vitals:   03/11/20 0600 03/11/20 0700 03/11/20 0724 03/11/20 0800  BP: 133/81 140/64  (!) 157/68  Pulse: 77 68 69 84  Resp: 19 18 18 16   Temp:    (!) 101.3 F (38.5 C)  TempSrc:    Axillary  SpO2: 100% 100% 100% 100%  Weight:      Height:       CBC:  Recent Labs  Lab 03/10/20 0449 03/11/20 0437  WBC 13.1* 13.7*  NEUTROABS 9.1* 9.4*  HGB 11.4* 10.5*  HCT 37.0 35.1*  MCV 105.7* 107.3*  PLT 284 257   Basic Metabolic Panel:  Recent Labs  Lab 03/10/20 0449 03/10/20 1320 03/10/20 1816 03/10/20 1816 03/10/20 2330 03/11/20 0437  NA 155*   < > 160*   < > 157* 155*  K 4.7  --   --   --   --  3.7  CL 126*  --   --   --   --  126*  CO2 22  --   --   --   --  22  GLUCOSE 189*  --   --   --   --  170*  BUN 9  --   --   --   --  12  CREATININE 0.97  --   --   --   --  0.98  CALCIUM 8.6*  --   --   --   --  8.7*  MG  --   --  2.4  --   --  2.2  PHOS  --   --  2.9  --   --  3.5   < > = values in this interval not displayed.   Lipid Panel:  Recent Labs  Lab 03/09/20 0244 03/10/20 0449 03/11/20 0437  CHOL 288*  --   --   TRIG 371*   < > 227*  HDL 40*  --   --   CHOLHDL 7.2  --   --   VLDL 74*  --   --   LDLCALC 174*  --   --    < > = values in this interval not displayed.   HgbA1c:  Recent Labs  Lab 03/09/20 1119  HGBA1C 10.7*   Urine Drug Screen:  Recent Labs  Lab 03/09/20 0300  LABOPIA NONE DETECTED  COCAINSCRNUR NONE DETECTED  LABBENZ POSITIVE*  AMPHETMU NONE DETECTED  THCU NONE DETECTED  LABBARB POSITIVE*    Alcohol Level No results for input(s): ETH in the last 168 hours.  IMAGING past 24  hours CT HEAD WO CONTRAST  Result Date: 03/11/2020 CLINICAL DATA:  Stroke follow-up EXAM: CT HEAD WITHOUT CONTRAST TECHNIQUE: Contiguous axial images were obtained from the base of the skull through the vertex without intravenous contrast. COMPARISON:  Brain MRI from 2 days ago FINDINGS: Brain: Cytotoxic edema in the left MCA distribution affecting essentially the entire territory. There is progressive swelling with midline shift measuring up to 9 mm as compared to 6 mm previously. No entrapment or new infarct is seen. Petechial hemorrhage and small nodular foci of hemorrhage at the left basal ganglia have mildly regressed. Vascular:  Pseudo hyperdense versus truly hyperdense left MCA branches. Skull: Negative Sinuses/Orbits: Unremarkable IMPRESSION: Acute left MCA territory infarct with mildly progressive swelling (9 mm midline shift) and regressed hemorrhage. Electronically Signed   By: Marnee SpringJonathon  Watts M.D.   On: 03/11/2020 06:26   EEG adult  Result Date: 03/10/2020 Charlsie QuestYadav, Priyanka O, MD     03/10/2020  3:48 PM Patient Name: Quintin AltoSylvia Hallett MRN: [id number] Epilepsy Attending: Charlsie QuestPriyanka O Yadav Referring Physician/Provider: Dr Marvel PlanJindong Ilaisaane Marts Date: 03/10/2020 Duration: 24.04 mins Patient history: 49yo F with  Left MCA infarct with R hemiparesis, facial droop, left gaze, and global aphasia. EEG to evaluate for seizure Level of alertness: comatose AEDs during EEG study: Propofol Technical aspects: This EEG study was done with scalp electrodes positioned according to the 10-20 International system of electrode placement. Electrical activity was acquired at a sampling rate of 500Hz  and reviewed with a high frequency filter of 70Hz  and a low frequency filter of 1Hz . EEG data were recorded continuously and digitally stored. Description: EEG showed continuous 3 to 6 Hz theta-delta slowing in left hemisphere as well as 8-10Hz  alpha activity in right hemisphere. Hyperventilation and photic stimulation were not performed.    ABNORMALITY -Continuous slow, lateralized left hemipshere IMPRESSION: This study is suggestive of cortical dysfunction in left hemisphere likely secondary to underlying infarct. No seizures or epileptiform discharges were seen throughout the recording. Priyanka Annabelle Harman Yadav    PHYSICAL EXAM   Temp:  [98.2 F (36.8 C)-101.3 F (38.5 C)] 101.3 F (38.5 C) (09/24 0800) Pulse Rate:  [65-96] 84 (09/24 0800) Resp:  [11-28] 16 (09/24 0800) BP: (116-159)/(49-81) 157/68 (09/24 0800) SpO2:  [96 %-100 %] 100 % (09/24 0800) FiO2 (%):  [40 %] 40 % (09/24 0800) Weight:  [84.4 kg] 84.4 kg (09/24 0500)  General - Well nourished, well developed, intubated on sedation.  Ophthalmologic - fundi not visualized due to noncooperation.  Cardiovascular - Regular rate and rhythm.  Neuro - intubated on sedation, eyes close but easily open on voice, still not following commands. With eye opening, eyes in left gaze preference but able to cross midline briefly, right gaze incomplete, not blinking to visual threat on the right, doll's eyes present, tracking on the left not on the right. Pupil right 4mm and left 2.195mm, under dark more prominent, consistent with left horner syndrome. Corneal reflex present, gag and cough present. Breathing over the vent.  Facial symmetry not able to test due to ET tube.  Tongue protrusion not cooperative. Able to hold LUE against gravity without drift, LLE mild withdraw to pain and able to hold knee flexion and foot in bed position without drift. RUE flaccid and RLE slight withdraw to pain but increased muscle tone, not able to hold on knee flexion and foot in bed position. DTR 1+ and no babinski. Sensation, coordination and gait not tested.   ASSESSMENT/PLAN Ms. Quintin AltoSylvia Stilley is a 49 y.o. female with history of difficulty to control DB, HLD, syncope [facility] with R hemiparesis, facial droop, left gaze, and global aphasia w/ glucose 400s where she was found to have L  M1 occlusion and [facility]   Stroke:   L MCA infarct s/p IR w/ post procedure hemorrhage, infarct secondary to large vessel disease source  CT head [facility] L MCA infarct w/ ASPECTS 6  CTA head & neck [facility]) L M2 cutoff  CT head L MCA territory infarct. ASPECTS 6.   CT perfusion 63 core  L brain same as CT. 32 penumbra. 1.5 mismatch ratio.  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 Post IR hemorrhage.  CT head 9/21 1818 increased L MCA ischemic infarct w/ mass effect and partial effacement L lateral ventrilc w/ 19mm midline shift. Small foci L BG HT same w/ increased L perisylvian and L cerebral convexity hemorrhage.   CT head 9/22 0151 evolution large L MCA infarct w/ slightly worse effacement and mass effect w/ 31mm midline shift. Scattered foci hemorrhage in infarct same w/ contrast clearing. L M1 and proximal L MCA branches w/ persistent hyperdensity.   CT head 9/22 0827  L MCA infarct and hemorrhages same. Increase in mass effect and effacement now 41mm midline shift. L M1 and proximal L MCA braches w/ persistent hyperdensity.   MRI 9/22 Large L MCA infarct w/ moderate infarct and SAH. Edema, 60mm midline shift.   MRA 9/22 L MCA 1 occlusion   CT head 9/24 L MCA infarct w/ mild increase in edema, 81mm midline shift    2D Echo EF 60-65%. No source of embolus   Recent Zio patch Normal w/ infrequent PACs, PVCs 01/07/2020  LDL 174   HgbA1c 10.7   VTE prophylaxis - change to Lovenox 40 mg sq daily    clopidogrel 75 mg daily prior to admission, now on No antithrombotic given post IR hemorrhage   Therapy recommendations:  pending   Disposition:  pending   Acute Respiratory Failure  Secondary to stroke  Intubated for IR, left intubated post IR    On low dose sedation  CCM on board   Cerebral Edema  MRI 9/22 with MLS @ 76mm  CT head  9/24 L MCA infarct w/ mild increase in edema, 82mm midline shift    On 3% protocol - @75 ->50->NS  Goal Na 150-160  Na 133->144->145->149->153->155->160->157->155->154   NS onboard ) - no hemicrani yet.    HOB > 30  Hold antiplatelets and keep NPO per NSG  Has PICC  Fever  LUE Shivering  Tmax 100->101.4  Leukocytosis WBC 24.2->18.7->13.1->13.7  EEG L slowing, no sz  UA pending  CXR pending  R Groin Firmness, improved  R distal pulse 1+  Groin Doppler R common femoral patent, no pseudoaneurysm, AVF or DVT.   Continue monitoring  Blood Pressure  Home meds:  None, no hx HTN  Stable . BP goal < 160 for now due to HT   . Off Cleviprex gtt  . Long-term BP goal normotensive  Hyperlipidemia  Home meds:  lipitor 20  LDL 174, goal < 70  On lipitor 80  Continue statin at discharge  Diabetes type II Uncontrolled  Home meds:  Glimepiride , lantus 21, liraglutide 1.8  HgbA1c 10.7, goal < 7.0  CBGs  Hyperglycemia improved  SSI 0-15  On lantus 28  DM coordinator consulted  Dysphagia . Secondary to stroke . NPO except meds . on TF @ 47 via cortrak  . On IVF @ 52 . Speech on board  Tobacco abuse  Current smoker  Will provide smoking cessation when able    Other Stroke Risk Factors  UDS positive benzos and barb (done following IR procedure)   Obesity, Body mass index is 36.34 kg/m., recommend weight loss, diet and exercise as appropriate   Family hx stroke (paternal grandfather)  Migraines on fioricet  Other Active Problems  Chronic pain, Anxiety  Acute blood loss anemia s/p IR, Hgb 14.3->12.1->11.4->10.5  Hospital day #  3  This patient is critically ill due to large left MCA stroke, hemorrhagic conversion, cerebral edema, fever, uncontrolled diabetes, respiratory failure and at significant risk of neurological worsening, death form hemorrhagic conversion, recurrent stroke, DKA, brain herniation, seizure. This patient's care  requires constant monitoring of vital signs, hemodynamics, respiratory and cardiac monitoring, review of multiple databases, neurological assessment, discussion with family, other specialists and medical decision making of high complexity. I spent 35 minutes of neurocritical care time in the care of this patient. I had long discussion with son over the phone, updated pt current condition, treatment plan and potential prognosis, and answered all the questions.  He expressed understanding and appreciation and indicated aggressive care for this patient.  I also discussed with Dr. Gaynell Face.    Marvel Plan, MD PhD Stroke Neurology 03/11/2020 10:35 AM   To contact Stroke Continuity provider, please refer to [email or web address]. After hours, contact General Neurology

## 2020-03-12 DIAGNOSIS — Z978 Presence of other specified devices: Secondary | ICD-10-CM | POA: Diagnosis not present

## 2020-03-12 DIAGNOSIS — R338 Other retention of urine: Secondary | ICD-10-CM

## 2020-03-12 DIAGNOSIS — R509 Fever, unspecified: Secondary | ICD-10-CM | POA: Diagnosis not present

## 2020-03-12 DIAGNOSIS — I63512 Cerebral infarction due to unspecified occlusion or stenosis of left middle cerebral artery: Secondary | ICD-10-CM | POA: Diagnosis not present

## 2020-03-12 DIAGNOSIS — Z7189 Other specified counseling: Secondary | ICD-10-CM | POA: Diagnosis not present

## 2020-03-12 DIAGNOSIS — J9601 Acute respiratory failure with hypoxia: Secondary | ICD-10-CM

## 2020-03-12 DIAGNOSIS — Z515 Encounter for palliative care: Secondary | ICD-10-CM

## 2020-03-12 DIAGNOSIS — I63319 Cerebral infarction due to thrombosis of unspecified middle cerebral artery: Secondary | ICD-10-CM | POA: Diagnosis not present

## 2020-03-12 DIAGNOSIS — I6602 Occlusion and stenosis of left middle cerebral artery: Secondary | ICD-10-CM | POA: Diagnosis not present

## 2020-03-12 LAB — CBC WITH DIFFERENTIAL/PLATELET
Abs Immature Granulocytes: 0.1 10*3/uL — ABNORMAL HIGH (ref 0.00–0.07)
Basophils Absolute: 0.1 10*3/uL (ref 0.0–0.1)
Basophils Relative: 1 %
Eosinophils Absolute: 0.2 10*3/uL (ref 0.0–0.5)
Eosinophils Relative: 2 %
HCT: 34.3 % — ABNORMAL LOW (ref 36.0–46.0)
Hemoglobin: 10.5 g/dL — ABNORMAL LOW (ref 12.0–15.0)
Immature Granulocytes: 1 %
Lymphocytes Relative: 20 %
Lymphs Abs: 2.6 10*3/uL (ref 0.7–4.0)
MCH: 32.4 pg (ref 26.0–34.0)
MCHC: 30.6 g/dL (ref 30.0–36.0)
MCV: 105.9 fL — ABNORMAL HIGH (ref 80.0–100.0)
Monocytes Absolute: 1 10*3/uL (ref 0.1–1.0)
Monocytes Relative: 8 %
Neutro Abs: 9.2 10*3/uL — ABNORMAL HIGH (ref 1.7–7.7)
Neutrophils Relative %: 68 %
Platelets: 259 10*3/uL (ref 150–400)
RBC: 3.24 MIL/uL — ABNORMAL LOW (ref 3.87–5.11)
RDW: 15.1 % (ref 11.5–15.5)
WBC: 13.3 10*3/uL — ABNORMAL HIGH (ref 4.0–10.5)
nRBC: 0 % (ref 0.0–0.2)

## 2020-03-12 LAB — BASIC METABOLIC PANEL
Anion gap: 10 (ref 5–15)
BUN: 16 mg/dL (ref 6–20)
CO2: 24 mmol/L (ref 22–32)
Calcium: 8.7 mg/dL — ABNORMAL LOW (ref 8.9–10.3)
Chloride: 117 mmol/L — ABNORMAL HIGH (ref 98–111)
Creatinine, Ser: 0.93 mg/dL (ref 0.44–1.00)
GFR calc Af Amer: >60 mL/min (ref >60)
GFR calc non Af Amer: >60 mL/min (ref >60)
Glucose, Bld: 256 mg/dL — ABNORMAL HIGH (ref 70–99)
Potassium: 3.9 mmol/L (ref 3.5–5.1)
Sodium: 151 mmol/L — ABNORMAL HIGH (ref 135–145)

## 2020-03-12 LAB — SODIUM
Sodium: 150 mmol/L — ABNORMAL HIGH (ref 135–145)
Sodium: 149 mmol/L — ABNORMAL HIGH (ref 135–145)
Sodium: 148 mmol/L — ABNORMAL HIGH (ref 135–145)
Sodium: 149 mmol/L — ABNORMAL HIGH (ref 135–145)

## 2020-03-12 LAB — URINALYSIS, COMPLETE (UACMP) WITH MICROSCOPIC
Bacteria, UA: NONE SEEN
Bilirubin Urine: NEGATIVE
Glucose, UA: >=500 mg/dL — AB
Hgb urine dipstick: NEGATIVE
Ketones, ur: NEGATIVE mg/dL
Leukocytes,Ua: NEGATIVE
Nitrite: NEGATIVE
Protein, ur: NEGATIVE mg/dL
Specific Gravity, Urine: 1.021 (ref 1.005–1.030)
pH: 5 (ref 5.0–8.0)

## 2020-03-12 LAB — TRIGLYCERIDES: Triglycerides: 249 mg/dL — ABNORMAL HIGH (ref <150)

## 2020-03-12 MED ORDER — VECURONIUM BROMIDE 10 MG IV SOLR
10.0000 mg | Freq: Once | INTRAVENOUS | Status: AC
  Administered 2020-03-13: 10 mg via INTRAVENOUS
  Filled 2020-03-12: qty 10

## 2020-03-12 MED ORDER — ETOMIDATE 2 MG/ML IV SOLN
20.0000 mg | Freq: Once | INTRAVENOUS | Status: AC
  Administered 2020-03-13: 20 mg via INTRAVENOUS
  Filled 2020-03-12: qty 10

## 2020-03-12 MED ORDER — SODIUM CHLORIDE 23.4 % INJECTION (4 MEQ/ML) FOR IV ADMINISTRATION
120.0000 meq | Freq: Once | INTRAVENOUS | Status: AC
  Administered 2020-03-12: 120 meq via INTRAVENOUS
  Filled 2020-03-12: qty 30

## 2020-03-12 MED ORDER — INSULIN ASPART 100 UNIT/ML ~~LOC~~ SOLN
3.0000 [IU] | SUBCUTANEOUS | Status: DC
  Administered 2020-03-12 – 2020-03-19 (×39): 3 [IU] via SUBCUTANEOUS

## 2020-03-12 MED ORDER — BETHANECHOL CHLORIDE 10 MG PO TABS
10.0000 mg | ORAL_TABLET | Freq: Three times a day (TID) | ORAL | Status: DC
  Administered 2020-03-12 – 2020-03-30 (×55): 10 mg
  Filled 2020-03-12 (×54): qty 1

## 2020-03-12 MED ORDER — FENTANYL CITRATE (PF) 100 MCG/2ML IJ SOLN
200.0000 ug | Freq: Once | INTRAMUSCULAR | Status: AC
  Administered 2020-03-13: 200 ug via INTRAVENOUS
  Filled 2020-03-12: qty 4

## 2020-03-12 MED ORDER — MIDAZOLAM HCL 2 MG/2ML IJ SOLN
5.0000 mg | Freq: Once | INTRAMUSCULAR | Status: AC
  Administered 2020-03-13: 4 mg via INTRAVENOUS
  Filled 2020-03-12: qty 6

## 2020-03-12 NOTE — Evaluation (Signed)
Physical Therapy Evaluation Patient Details Name: Deborah [name] MRN: [id number] DOB: 1971-02-14 Today's Date: 03/12/2020   History of Present Illness  49 y/o female with history of IDDM, HLD, tobacco use disorder presented to [facility] for facial droop, right sided weakness and global aphasia. Found to have LVO of left M1 segment and was transferred to [facility]. Pt underwent left common carotid arteriogram followed by revascularization of L MCA, however it reoccluded due to sever distal M1 stenosis.  Clinical Impression  Pt presents to PT with deficits in functional mobility, gait, balance, endurance, strength, power, vision, tone. Pt is flaccid on R side and demonstrates a L gaze preference during session. Pt demonstrates impaired command following, although her ability to follow does improve with continued stimulation during session. Pt requires significant assistance to perform all functional mobility at this time due to impaired strength and balance. Pt will benefit from continued acute PT POC to reduce caregiver burden and improve functional mobility quality. PT recommends SNF placement at this time.    Follow Up Recommendations SNF    Equipment Recommendations  Wheelchair (measurements PT);Wheelchair cushion (measurements PT);Hospital bed (mechanical lift, all if home today)    Recommendations for Other Services       Precautions / Restrictions Precautions Precautions: Fall Restrictions Weight Bearing Restrictions: No      Mobility  Bed Mobility Overal bed mobility: Needs Assistance             General bed mobility comments: pt pulls into long sitting with use of LUE from semi-fowlers with elevated HOB, pt is able to clear head and upper back from bed  Transfers                    Ambulation/Gait                Stairs            Wheelchair Mobility    Modified Rankin (Stroke Patients Only) Modified Rankin (Stroke Patients  Only) Pre-Morbid Rankin Score: No significant disability (unable to determine accurate baseline) Modified Rankin: Severe disability     Balance Overall balance assessment: Needs assistance Sitting-balance support: Feet supported;Single extremity supported Sitting balance-Leahy Scale: Zero Sitting balance - Comments: maxA in attempts at long sitting Postural control: Posterior lean                                   Pertinent Vitals/Pain Pain Assessment: Faces Faces Pain Scale: Hurts little more Pain Location: generalized Pain Descriptors / Indicators: Grimacing Pain Intervention(s): Monitored during session    Home Living Family/patient expects to be discharged to:: Other (Comment)                 Additional Comments: difficult to determine, pt intubated, no family present    Prior Function           Comments: difficult to determine, pt intubated and no family present     Hand Dominance        Extremity/Trunk Assessment   Upper Extremity Assessment Upper Extremity Assessment: RUE deficits/detail;LUE deficits/detail RUE Deficits / Details: RUE is flaccid, no AROM noted, PROM WFL, withdraws to painful stimuli LUE Deficits / Details: LUE grossly 3-/5, some movement of shoulder and elbow against gravity noted at end of session, doe follow commands to squeeze and move elbow    Lower Extremity Assessment Lower Extremity Assessment: RLE deficits/detail;LLE deficits/detail RLE Deficits / Details:  RLE is flaccid, withdraws to pain, PROM WFL, no AROM noted LLE Deficits / Details: ankle 3/5 PF/DF, 2-/5 knee extension and flexion, 2-/5 hip external and internal rotation, no active hip flexion/abduction/adduction noted    Cervical / Trunk Assessment Cervical / Trunk Assessment: Other exceptions Cervical / Trunk Exceptions: forward head, rounded shoulders  Communication   Communication: Other (comment) (intubated)  Cognition Arousal/Alertness:  Awake/alert (awakens with stimulation) Behavior During Therapy: Flat affect Overall Cognitive Status: Difficult to assess                                 General Comments: pt inconsistently follows commands with increased time although command following does improve with continued stimulation of the patient.      General Comments General comments (skin integrity, edema, etc.): VSS on vent with 40% FiO2    Exercises     Assessment/Plan    PT Assessment Patient needs continued PT services  PT Problem List Decreased strength;Decreased activity tolerance;Decreased balance;Decreased mobility;Decreased cognition;Decreased knowledge of use of DME;Decreased safety awareness;Decreased knowledge of precautions;Impaired tone       PT Treatment Interventions DME instruction;Gait training;Functional mobility training;Therapeutic activities;Therapeutic exercise;Balance training;Neuromuscular re-education;Cognitive remediation;Patient/family education    PT Goals (Current goals can be found in the Care Plan section)  Acute Rehab PT Goals Patient Stated Goal: pt unable to state, PT goal to improve strength and functional mobility quality while reducing caregiver burden PT Goal Formulation: Patient unable to participate in goal setting Time For Goal Achievement: 03/26/20 Potential to Achieve Goals: Fair    Frequency Min 4X/week   Barriers to discharge        Co-evaluation PT/OT/SLP Co-Evaluation/Treatment: Yes Reason for Co-Treatment: Complexity of the patient's impairments (multi-system involvement);Necessary to address cognition/behavior during functional activity;For patient/therapist safety PT goals addressed during session: Mobility/safety with mobility;Balance;Strengthening/ROM         AM-PAC PT "6 Clicks" Mobility  Outcome Measure Help needed turning from your back to your side while in a flat bed without using bedrails?: Total Help needed moving from lying on your  back to sitting on the side of a flat bed without using bedrails?: Total Help needed moving to and from a bed to a chair (including a wheelchair)?: Total Help needed standing up from a chair using your arms (e.g., wheelchair or bedside chair)?: Total Help needed to walk in hospital room?: Total Help needed climbing 3-5 steps with a railing? : Total 6 Click Score: 6    End of Session Equipment Utilized During Treatment: Oxygen Activity Tolerance: Patient tolerated treatment well Patient left: in bed;with call bell/phone within reach;with bed alarm set Nurse Communication: Mobility status;Need for lift equipment PT Visit Diagnosis: (P) Other abnormalities of gait and mobility (R26.89);Muscle weakness (generalized) (M62.81);Other symptoms and signs involving the nervous system (R29.898);Hemiplegia and hemiparesis    Time: 9373-4287 PT Time Calculation (min) (ACUTE ONLY): 27 min   Charges:   PT Evaluation $PT Eval Moderate Complexity: 1 Mod          Arlyss Gandy, PT, DPT Acute Rehabilitation Pager: [phone]   Arlyss Gandy 03/12/2020, 5:23 PM

## 2020-03-12 NOTE — Evaluation (Signed)
Occupational Therapy Evaluation Patient Details Name: Deborah [name] MRN: [id number] DOB: [birth year] Today's Date: 03/12/2020    History of Present Illness 49 y/o female with history of IDDM, HLD, tobacco use disorder presented to [facility] for facial droop, right sided weakness and global aphasia. Found to have LVO of left M1 segment and was transferred to [facility]. Pt underwent left common carotid arteriogram followed by revascularization of L MCA, however it reoccluded due to sever distal M1 stenosis.   Clinical Impression   Unsure of PLOF as Pt is intubated and no family present, but per Palliative note, Pt was ambulating without DME and was able to perform ADL independently. Today Pt is total care for ADL and transfers. Noted movement in LUE (3-) and LLE, flaccid in RUE with the exception of withdrawal from painful stimuli. Vision is impaired and will need further assessment. Pt will benefit from skilled OT in the acute setting as well as post-acute at the SNF level.     Follow Up Recommendations  SNF    Equipment Recommendations  Other (comment) (defer to next venue of care)    Recommendations for Other Services       Precautions / Restrictions Precautions Precautions: Fall Restrictions Weight Bearing Restrictions: No      Mobility Bed Mobility Overal bed mobility: Needs Assistance             General bed mobility comments: pt pulls into long sitting with use of LUE from semi-fowlers with elevated HOB, pt is able to clear head and upper back from bed  Transfers                      Balance Overall balance assessment: Needs assistance Sitting-balance support: Feet supported;Single extremity supported Sitting balance-Leahy Scale: Zero Sitting balance - Comments: maxA in attempts at long sitting Postural control: Posterior lean                                 ADL either performed or assessed with clinical judgement   ADL Overall ADL's  : Needs assistance/impaired                                       General ADL Comments: total A at this time     Vision   Vision Assessment?: Vision impaired- to be further tested in functional context Additional Comments: Pt does not blink to threat, she can look in all directions, but not following commands enough to determine if she can follow an object/person. will need further investigation. Initially she did have left gaze, which improved with stimulation to look to the right, and moments of nystagmus noted     Perception     Praxis      Pertinent Vitals/Pain Pain Assessment: Faces Faces Pain Scale: Hurts little more Pain Location: generalized Pain Descriptors / Indicators: Grimacing Pain Intervention(s): Monitored during session     Hand Dominance     Extremity/Trunk Assessment Upper Extremity Assessment Upper Extremity Assessment: RUE deficits/detail;LUE deficits/detail RUE Deficits / Details: RUE is flaccid, no AROM noted, PROM WFL, withdraws to painful stimuli RUE Coordination: decreased fine motor;decreased gross motor LUE Deficits / Details: LUE grossly 3-/5, some movement of shoulder and elbow against gravity noted at end of session, doe follow commands to squeeze and move elbow LUE Coordination: decreased fine motor;decreased  gross motor   Lower Extremity Assessment Lower Extremity Assessment: Defer to PT evaluation RLE Deficits / Details: RLE is flaccid, withdraws to pain, PROM WFL, no AROM noted LLE Deficits / Details: ankle 3/5 PF/DF, 2-/5 knee extension and flexion, 2-/5 hip external and internal rotation, no active hip flexion/abduction/adduction noted   Cervical / Trunk Assessment Cervical / Trunk Assessment: Other exceptions Cervical / Trunk Exceptions: forward head, rounded shoulders   Communication Communication Communication: Other (comment) (intubated)   Cognition Arousal/Alertness: Awake/alert (awakens with stimulation) Behavior  During Therapy: Flat affect Overall Cognitive Status: Difficult to assess                                 General Comments: pt inconsistently follows commands with increased time although command following does improve with continued stimulation of the patient.   General Comments  VSS on vent with 40% FiO2 - going for trach tomorrow (pending equipment)    Exercises     Shoulder Instructions      Home Living Family/patient expects to be discharged to:: Other (Comment)                                 Additional Comments: difficult to determine, pt intubated, no family present      Prior Functioning/Environment          Comments: difficult to determine, pt intubated and no family present        OT Problem List: Decreased strength;Decreased range of motion;Decreased activity tolerance;Impaired balance (sitting and/or standing);Impaired vision/perception;Decreased coordination;Decreased cognition;Decreased safety awareness;Obesity;Impaired UE functional use;Increased edema      OT Treatment/Interventions: Self-care/ADL training;Therapeutic exercise;Neuromuscular education;DME and/or AE instruction;Manual therapy;Therapeutic activities;Patient/family education;Balance training;Visual/perceptual remediation/compensation    OT Goals(Current goals can be found in the care plan section) Acute Rehab OT Goals Patient Stated Goal: pt unable to state ADL Goals Pt Will Perform Grooming: with mod assist;sitting Pt Will Transfer to Toilet: with max assist;stand pivot transfer;bedside commode Pt/caregiver will Perform Home Exercise Program: Increased ROM;Increased strength;Right Upper extremity;Left upper extremity Additional ADL Goal #1: Pt will tolerate sitting EOB for 10 min as preparation for ADL  OT Frequency: Min 2X/week   Barriers to D/C:            Co-evaluation PT/OT/SLP Co-Evaluation/Treatment: Yes Reason for Co-Treatment: Complexity of the  patient's impairments (multi-system involvement);Necessary to address cognition/behavior during functional activity;For patient/therapist safety PT goals addressed during session: Strengthening/ROM;Balance OT goals addressed during session: ADL's and self-care;Strengthening/ROM;Other (comment) (vision, cognition)      AM-PAC OT "6 Clicks" Daily Activity     Outcome Measure Help from another person eating meals?: Total Help from another person taking care of personal grooming?: Total Help from another person toileting, which includes using toliet, bedpan, or urinal?: Total Help from another person bathing (including washing, rinsing, drying)?: Total Help from another person to put on and taking off regular upper body clothing?: Total Help from another person to put on and taking off regular lower body clothing?: Total 6 Click Score: 6   End of Session Equipment Utilized During Treatment: Oxygen (ventilator) Nurse Communication: Mobility status  Activity Tolerance: Patient tolerated treatment well Patient left: in bed;with call bell/phone within reach;with bed alarm set;with SCD's reapplied  OT Visit Diagnosis: Unsteadiness on feet (R26.81);Other abnormalities of gait and mobility (R26.89);Muscle weakness (generalized) (M62.81);Other symptoms and signs involving the nervous system (R29.898);Other symptoms and  signs involving cognitive function;Hemiplegia and hemiparesis Hemiplegia - Right/Left: Right Hemiplegia - dominant/non-dominant: Dominant                Time: 6144-3154 OT Time Calculation (min): 23 min Charges:  OT General Charges $OT Visit: 1 Visit OT Evaluation $OT Eval High Complexity: 1 High  Nyoka Cowden OTR/L Acute Rehabilitation Services Pager: [phone] Office: [phone]  Evern Bio Karlisa Gaubert 03/12/2020, 6:27 PM

## 2020-03-12 NOTE — Progress Notes (Signed)
STROKE TEAM PROGRESS NOTE   INTERVAL HISTORY RN at bedside.  Patient still intubated, neuro stable, open eyes on voice, seems able to follow midline commands, not peripheral commands.  Sodium 149, will give 23.4% bolus.  Patient on weaning trials, however per CCM, patient need to trach.  I talked to patient's son yesterday and patient boyfriend today, they are in agreement.  I also discussed with CCM attending Dr. Wynona Neat and Dr. Katrinka Blazing, they would arrange for trach tomorrow.  Patient had frequent INO for urinary retention, will put a Foley catheter and start on Urecholine.   Vitals:   03/12/20 0500 03/12/20 0600 03/12/20 0700 03/12/20 0800  BP: 135/62 139/67 (!) 158/72 (!) 174/74  Pulse: 64 62 62 66  Resp: 19 18 19  (!) 32  Temp:    99.9 F (37.7 C)  TempSrc:    Axillary  SpO2: 100% 100% 100% 100%  Weight: 84.7 kg     Height:       CBC:  Recent Labs  Lab 03/11/20 0437 03/12/20 0441  WBC 13.7* 13.3*  NEUTROABS 9.4* 9.2*  HGB 10.5* 10.5*  HCT 35.1* 34.3*  MCV 107.3* 105.9*  PLT 257 259   Basic Metabolic Panel:  Recent Labs  Lab 03/11/20 0437 03/11/20 1134 03/11/20 1939 03/12/20 0441  NA 155*   < > 153* 151*  150*  K 3.7  --   --  3.9  CL 126*  --   --  117*  CO2 22  --   --  24  GLUCOSE 170*  --   --  256*  BUN 12  --   --  16  CREATININE 0.98  --   --  0.93  CALCIUM 8.7*  --   --  8.7*  MG 2.2  --  2.0  --   PHOS 3.5  --  3.3  --    < > = values in this interval not displayed.   Lipid Panel:  Recent Labs  Lab 03/09/20 0244 03/10/20 0449 03/12/20 0441  CHOL 288*  --   --   TRIG 371*   < > 249*  HDL 40*  --   --   CHOLHDL 7.2  --   --   VLDL 74*  --   --   LDLCALC 174*  --   --    < > = values in this interval not displayed.   HgbA1c:  Recent Labs  Lab 03/09/20 1119  HGBA1C 10.7*   Urine Drug Screen:  Recent Labs  Lab 03/09/20 0300  LABOPIA NONE DETECTED  COCAINSCRNUR NONE DETECTED  LABBENZ POSITIVE*  AMPHETMU NONE DETECTED  THCU NONE DETECTED   LABBARB POSITIVE*    Alcohol Level No results for input(s): ETH in the last 168 hours.  IMAGING past 24 hours DG CHEST PORT 1 VIEW  Result Date: 03/11/2020 CLINICAL DATA:  Fever EXAM: PORTABLE CHEST 1 VIEW COMPARISON:  03/09/2020 FINDINGS: Heart size and pulmonary vascularity are normal for technique. Lungs are clear. No pleural effusions. No pneumothorax. There is an endotracheal tube with tip measuring 2.4 cm above the carina. An enteric tube is present with tip off the field of view. IMPRESSION: No evidence of active pulmonary disease. Appliances appear in satisfactory position. Electronically Signed   By: 03/11/2020 M.D.   On: 03/11/2020 20:22    PHYSICAL EXAM  Temp:  [98.7 F (37.1 C)-100.3 F (37.9 C)] 99.9 F (37.7 C) (09/25 0800) Pulse Rate:  [62-87] 66 (09/25 0800) Resp:  [  12-32] 32 (09/25 0800) BP: (121-174)/(53-74) 174/74 (09/25 0800) SpO2:  [96 %-100 %] 100 % (09/25 0800) FiO2 (%):  [40 %] 40 % (09/25 0800) Weight:  [84.7 kg] 84.7 kg (09/25 0500)  General - Well nourished, well developed, intubated on sedation.  Ophthalmologic - fundi not visualized due to noncooperation.  Cardiovascular - Regular rate and rhythm.  Neuro - intubated on sedation, eyes open on voice, seem to able to follow eye opening and closure, still not following peripheral commands. With eye opening, eyes in left gaze preference but able to cross midline briefly, right gaze incomplete, not blinking to visual threat on the right, doll's eyes present, tracking on the left not on the right. Pupil right 43mm and left 2.34mm, under dark more prominent, consistent with left horner syndrome. Corneal reflex present, gag and cough present. Breathing over the vent.  Facial symmetry not able to test due to ET tube.  Tongue protrusion not cooperative. Able to hold LUE against gravity without drift, LLE withdraw to pain and able to hold knee flexion and foot in bed position without drift. RUE flaccid and RLE mild  withdraw to pain but increased muscle tone, not able to hold on knee flexion and foot in bed position. DTR 1+ and no babinski. Sensation, coordination and gait not tested.   ASSESSMENT/PLAN Ms. Eleora Sutherland is a 49 y.o. female with history of difficulty to control DB, HLD, syncope presenting to [facility] with R hemiparesis, facial droop, left gaze, and global aphasia w/ glucose 400s where she was found to have L M1 occlusion and transferred to [facility] for IR.   Stroke:   L MCA infarct s/p IR w/ post procedure hemorrhage, infarct secondary to large vessel disease source  CT head [facility]) L MCA infarct w/ ASPECTS 6  CTA head & neck [facility]) L M2 cutoff  CT head L MCA territory infarct. ASPECTS 6.   CT perfusion 63 core L brain same as CT. 32 penumbra. 1.5 mismatch ratio.  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 Post IR hemorrhage.  CT head 9/21 1818 increased L MCA ischemic infarct w/ mass effect and partial effacement L lateral ventrilc w/ 52mm midline shift. Small foci L BG HT same w/ increased L perisylvian and L cerebral convexity hemorrhage.   CT head 9/22 0151 evolution large L MCA infarct w/ slightly worse effacement and mass effect w/ 74mm midline shift. Scattered foci hemorrhage in infarct same w/ contrast clearing. L M1 and proximal L MCA branches w/ persistent hyperdensity.   CT head 9/22 0827  L MCA infarct and hemorrhages same. Increase in mass effect and effacement now 74mm midline shift. L M1 and proximal L MCA braches w/ persistent hyperdensity.   MRI 9/22 Large L MCA infarct w/ moderate infarct and SAH. Edema, 32mm midline shift.   MRA 9/22 L MCA 1 occlusion   CT head 9/24 L MCA infarct w/ mild increase in edema, 76mm midline shift    CT repeat 9/26 pending  2D Echo EF 60-65%. No source of embolus   Recent Zio patch Normal w/ infrequent PACs,  PVCs 01/07/2020  LDL 174   HgbA1c 10.7   VTE prophylaxis - Lovenox 40 mg sq daily    clopidogrel 75 mg daily prior to admission, now on No antithrombotic given post IR hemorrhage. Repeat CT in am, if hemorrhage stable, will consider ASA 81.   Therapy recommendations:  pending   Disposition:  pending   Acute Respiratory Failure  Secondary to stroke  Intubated for IR, left intubated post IR    Off sedation  On weaning trial  CCM on board - feel pt needs trach  Discussed with son and boyfriend, they are in agreement to proceed  Discussed with Dr. Katrinka Blazing, will perform trach Sunday afternoon.   Cerebral Edema  MRI 9/22 with MLS @ 24mm  CT head 9/24 L MCA infarct w/ mild increase in edema, 14mm midline shift    On 3% protocol - @75 ->50->NS  23.4% x 1 (9/25)  Goal Na 150-160  Na 154->153->151->149  NS onboard 09-29-1970) - no hemicrani needed at this time    HOB > 30  Has PICC  Fever  LUE Shivering  Tmax 100->101.4->101.3->afebrile  Leukocytosis WBC 24.2->18.7->13.1->13.7->13.3  EEG L slowing, no sz  UA -> not c/w UTI  CXR - 03/11/20 - no evidence of active pulmonary disease  R Groin Firmness, improved  R distal pulse 1+  Groin Doppler R common femoral patent, no pseudoaneurysm, AVF or DVT.   Continue monitoring  Blood Pressure  Home meds:  None, no hx HTN  Stable . BP goal < 160 for now due to HT   . Off Cleviprex gtt  . Long-term BP goal normotensive  Hyperlipidemia  Home meds:  lipitor 20  LDL 174, goal < 70  On lipitor 80  Continue statin at discharge  Diabetes type II Uncontrolled  Home meds:  Glimepiride , lantus 21, liraglutide 1.8  HgbA1c 10.7, goal < 7.0  CBGs  Hyperglycemia improved  SSI 0-15  On lantus 28  DM coordinator on board  Dysphagia . Secondary to stroke . NPO except meds . on TF @ 47 via cortrak  . On IVF @ 52 . Speech on board  Tobacco abuse  Current smoker  Will provide smoking cessation when  able    Urinary retention  Off Foley catheter after procedure  Needed I&O x4-5  Will put back Foley catheter  On Urecholine  Other Stroke Risk Factors  UDS positive benzos and barb (done following IR procedure)   Obesity, Body mass index is 36.47 kg/m., recommend weight loss, diet and exercise as appropriate   Family hx stroke (paternal grandfather)  Migraines on fioricet  Other Active Problems  Chronic pain, Anxiety  Acute blood loss anemia s/p IR, Hgb 14.3->12.1->11.4->10.5->10.5  Hospital day # 4  This patient is critically ill due to large MCA stroke, cerebral edema, hemorrhagic conversion, hyperglycemia, fever and respirate failure and at significant risk of neurological worsening, death form brain herniation, recurrent stroke, hemorrhagic conversion, seizure, sepsis, DKA. This patient's care requires constant monitoring of vital signs, hemodynamics, respiratory and cardiac monitoring, review of multiple databases, neurological assessment, discussion with family, other specialists and medical decision making of high complexity. I spent 35 minutes of neurocritical care time in the care of this patient. I had long discussion with boyfriend over the phone, updated pt current condition, treatment plan and potential prognosis, and answered all the questions.  He expressed understanding and appreciation.  I called son yesterday also.  Both son and boyfriend are in agreement with trach.  I discussed with Drs. 03/13/20, plan for trach tomorrow afternoon.   Wynona Dove, MD PhD Stroke Neurology 03/12/2020 11:48 AM    To contact Stroke Continuity provider, please refer to 03/14/2020. After hours, contact General Neurology

## 2020-03-12 NOTE — Consult Note (Signed)
Consultation Note Date: 03/12/2020   Patient Name: Deborah [name]  DOB: 11/17/1970  MRN: [id number]  Age / Sex: 49 y.o., female  PCP: [name], MD Referring Physician: Rosalin Hawking, MD  Reason for Consultation: Establishing goals of care  HPI/Patient Profile: 49 y.o. female  with past medical history of IDDM, HLD, and tobacco use admitted on 03/08/2020 with L MCA stroke with hemorrhagic conversion after unsuccessful revascularization. PMT consulted d/t plans for trach as patient is unable to protect airway.   Clinical Assessment and Goals of Care: I have reviewed medical records including EPIC notes, labs and imaging, received report from RN, assessed the patient and then spoke with patient's significant other and son  to discuss diagnosis prognosis, GOC, EOL wishes, disposition and options.  S/o Deborah [name] and son Deborah [name] tell me they make decisions together. Deborah [name] tells me they have been together for over ten years.   I introduced Palliative Medicine as specialized medical care for people living with serious illness. It focuses on providing relief from the symptoms and stress of a serious illness. The goal is to improve quality of life for both the patient and the family.  We discussed a brief life review of the patient. 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 He tells me about her passing out at work.   As far as functional and nutritional status, he tells me she's been doing "average". He does share about her chronic health conditions - blood sugars running 400-600 for about 6 months despite multiple medications changes. She had been having some chest and felt weak and tired all of then time. She was ambulatory and able to care for herself.    We discussed patient's current illness and what it means in the larger context of patient's on-going co-morbidities.  Natural disease  trajectory and expectations at EOL were discussed. Son and significant other express understanding of severity of situation. They tell me their fears that she will not survive.  I attempted to elicit values and goals of care important to the patient.  Deborah [name] shares that his mom had told him at one point that she would not want to be kept alive if she were "a vegetable, could not function, or was childlike". We discussed uncertainty of outcome. For now, Deborah [name] and Deborah [name] are both agreeable to full scope interventions and would like to see how Abbigayle does - plan to make decisions "day to day" depending on status.   Discussed with family the importance of continued conversation with family and the medical providers regarding overall plan of care and treatment options, ensuring decisions are within the context of the patient's values and GOCs.    Questions and concerns were addressed. The family was encouraged to call with questions or concerns.   Primary Decision Maker NEXT OF KIN - son Deborah [name] and sig other Deborah [name] have decided to make decisions together  SUMMARY OF RECOMMENDATIONS   - continue full code/full scope interventions - family aware of severity of situation - son shares patient has stated she would not want life prolonged in vegetative state or with significant loss of function - would like to allow time for outcomes prior to making further decisions - PMT will check in with patient/family intermittently  Code Status/Advance Care Planning:  Full code  Prognosis:   Unable to determine  Discharge Planning: To Be Determined      Primary Diagnoses: Present on Admission: . Acute  ischemic left MCA stroke ([place]) . Middle cerebral artery embolism, left   I have reviewed the medical record, interviewed the patient and family, and examined the patient. The following aspects are pertinent.  Past Medical History:  Diagnosis Date  . DM (diabetes mellitus), type 2 ([place])   . Hyperlipidemia    . Hypersomnia   . Sprain of right ankle, initial encounter   . Syncope    Social History   Socioeconomic History  . Marital status: Unknown    Spouse name: Not on file  . Number of children: Not on file  . Years of education: Not on file  . Highest education level: Not on file  Occupational History  . Not on file  Tobacco Use  . Smoking status: Current Every Day Smoker    Packs/day: 0.50    Types: Cigarettes  . Smokeless tobacco: Never Used  Vaping Use  . Vaping Use: Never used  Substance and Sexual Activity  . Alcohol use: Not Currently  . Drug use: Not Currently  . Sexual activity: Not on file  Other Topics Concern  . Not on file  Social History Narrative  . Not on file   Social Determinants of Health   Financial Resource Strain:   . Difficulty of Paying Living Expenses: Not on file  Food Insecurity:   . Worried About Charity fundraiser in the Last Year: Not on file  . Ran Out of Food in the Last Year: Not on file  Transportation Needs:   . Lack of Transportation (Medical): Not on file  . Lack of Transportation (Non-Medical): Not on file  Physical Activity:   . Days of Exercise per Week: Not on file  . Minutes of Exercise per Session: Not on file  Stress:   . Feeling of Stress : Not on file  Social Connections:   . Frequency of Communication with Friends and Family: Not on file  . Frequency of Social Gatherings with Friends and Family: Not on file  . Attends Religious Services: Not on file  . Active Member of Clubs or Organizations: Not on file  . Attends Archivist Meetings: Not on file  . Marital Status: Not on file   Family History  Problem Relation Age of Onset  . Heart defect Mother        Leaky Valve  . Heart Problems Father        Enlarged heart   . Heart defect Maternal Grandmother        Leaky Valve   . Stroke Paternal Grandfather    Scheduled Meds: . atorvastatin  80 mg Per Tube Daily  . bethanechol  10 mg Per Tube TID  .  chlorhexidine gluconate (MEDLINE KIT)  15 mL Mouth Rinse BID  . Chlorhexidine Gluconate Cloth  6 each Topical Daily  . docusate  100 mg Per Tube BID  . enoxaparin (LOVENOX) injection  40 mg Subcutaneous Q24H  . [START ON 03/13/2020] etomidate  20 mg Intravenous Once  . feeding supplement (PROSource TF)  45 mL Per Tube Daily  . [START ON 03/13/2020] fentaNYL (SUBLIMAZE) injection  200 mcg Intravenous Once  . insulin aspart  0-15 Units Subcutaneous Q4H  . insulin glargine  28 Units Subcutaneous QHS  . mouth rinse  15 mL Mouth Rinse 10 times per day  . [START ON 03/13/2020] midazolam  5 mg Intravenous Once  . multivitamin with minerals  1 tablet Per Tube Daily  . pantoprazole sodium  40 mg Per  Tube Daily  . polyethylene glycol  17 g Per Tube Daily  . sodium chloride flush  10-40 mL Intracatheter Q12H  . [START ON 03/13/2020] vecuronium  10 mg Intravenous Once   Continuous Infusions: . sodium chloride 50 mL/hr at 03/12/20 0831  . feeding supplement (VITAL HIGH PROTEIN) 45 mL/hr at 03/12/20 0700  . fentaNYL infusion INTRAVENOUS 25 mcg/hr (03/12/20 0700)  . propofol (DIPRIVAN) infusion Stopped (03/12/20 0831)  . sodium chloride 23.4% injection     PRN Meds:.acetaminophen **OR** acetaminophen (TYLENOL) oral liquid 160 mg/5 mL **OR** acetaminophen, dextrose, fentaNYL, labetalol, senna-docusate, sodium chloride flush Allergies  Allergen Reactions  . Aspirin Shortness Of Breath  . Mushroom Extract Complex Swelling   Review of Systems  Unable to perform ROS: Intubated    Physical Exam Constitutional:      Comments: Sedated, not interactive  Skin:    General: Skin is warm and dry.     Vital Signs: BP (!) 174/74 (BP Location: Right Arm)   Pulse 66   Temp 99.9 F (37.7 C) (Axillary)   Resp (!) 32   Ht 5' (1.524 m)   Wt 84.7 kg   SpO2 100%   BMI 36.47 kg/m  Pain Scale: CPOT       SpO2: SpO2: 100 % O2 Device:SpO2: 100 % O2 Flow Rate: .   IO: Intake/output summary:    Intake/Output Summary (Last 24 hours) at 03/12/2020 1243 Last data filed at 03/12/2020 0800 Gross per 24 hour  Intake 2033.81 ml  Output 1950 ml  Net 83.81 ml    LBM: Last BM Date: 03/12/20 Baseline Weight: Weight: 81.8 kg Most recent weight: Weight: 84.7 kg     Palliative Assessment/Data: PPS 30%    Time Total: 65 minutes Greater than 50%  of this time was spent counseling and coordinating care related to the above assessment and plan.  Juel Burrow, DNP, AGNP-C Palliative Medicine Team [phone] Pager: [phone]

## 2020-03-12 NOTE — Progress Notes (Signed)
NAME:  Deborah [name], MRN:  [id number], DOB:  28-Apr-1971, LOS: 4 ADMISSION DATE:  03/08/2020, CONSULTATION DATE:  03/08/20 REFERRING MD:  Neurology, CHIEF COMPLAINT:  CVA    Brief History   49 y/o female with history of IDDM, HLD, tobacco use disorder presented to [facility] for facial droop, right sided weakness and global aphasia. Found to have LVO of left M1 segment and was transferred to [facility] for attempt at endovascular revascularization.   History of present illness   49 y/o female presented after being found by her husband this morning with facial droop, right sided weakness, and global aphasia. LKN was 8:00 pm the night before. NIH score of 21 on arrival. She was immediately taken to IR and intubated for attempt at endovascular revascularization. Unfortunately, she had immediate reocclusion after achieving TICI 2c revascularization. Rescue stent not performed due to increased risk of ICH. She was transferred to the ICU for continued monitoring post-procedure.   Past Medical History  IDDM HLD Tobacco use Migraine headaches Chronic pain  Anxiety   Significant Hospital Events   9/21 > intubated for IR procedure 9/21> unsuccessful revascularization 9/21> admitted to neuro ICU  Consults:  PCCM Neurosurgery   Procedures:  9/21>cerebral arteriogram with emergent mechanical thrombectomy   Significant Diagnostic Tests:  9/21 CT [name] > Acute left MCA territory infarct without hemorrhagic conversion or progression from CT earlier today. Aspects remains 6  9/22 at 1 am CT [name] >Continued interval evolution of large left MCA territory infarct, overall similar in size and distribution from previous. Associated regional mass effect with partial effacement of the left lateral ventricle has mildly worsened, with worsened left-to-right midline shift now measuring up to 5 mm 9/22 at 7:50 am CT [name] > Unchanged extent of a large acute/early subacute left MCA vascular territory  infarct and associated hemorrhage as compared to the [name] CT performed earlier the same day at 12:57 a.m. Continued slight interval increase in mass effect with partial effacement of the left lateral ventricle and now 6 mm rightward midline shift (previously 5 mm). The basal cisterns remain patent. No evidence of ventricular entrapment. 9/22 MRI brain > Large territory infarct left MCA territory with moderate hemorrhage and mild subarachnoid hemorrhage. There is edema and mass-effect with 7 mm midline shift to the right. Left MCA is occluded in the M1 segment distal to an early temporal branch. No other intracranial stenosis 9/24 cth: Acute left MCA territory infarct with mildly progressive swelling (9 mm midline shift) and regressed hemorrhage.   Micro Data:  N/A  Antimicrobials:  N/A   Interim history/subjective:   9/24: pt remains on vent on light sedation awake but able to utilize extremities to follow commands. Does hold eyes shut on command. Unable to perform tongue thrust. Pt and family would benefit from palliative consult, d/w Dr Roda Shutters (he will speak with pt's boyfriend who son is deferring decisions to). 3% stopped overnight 2/2 Na >155. Neurosx signing off despite some progressive shift on imaging.  9/25: Planning for tracheostomy today  Objective   Blood pressure (!) 174/74, pulse 66, temperature 99.9 F (37.7 C), temperature source Axillary, resp. rate (!) 32, height 5' (1.524 m), weight 84.7 kg, SpO2 100 %.    Vent Mode: CPAP;PSV FiO2 (%):  [40 %] 40 % Set Rate:  [18 bmp] 18 bmp Vt Set:  [370 mL] 370 mL PEEP:  [8 cmH20] 8 cmH20 Pressure Support:  [10 cmH20] 10 cmH20 Plateau Pressure:  [13 cmH20-114 cmH20] 13 cmH20  Intake/Output Summary (Last 24 hours) at 03/12/2020 1124 Last data filed at 03/12/2020 0800 Gross per 24 hour  Intake 2133.19 ml  Output 1950 ml  Net 183.19 ml   Filed Weights   03/08/20 0923 03/11/20 0500 03/12/20 0500  Weight: 81.8 kg 84.4 kg 84.7 kg     Examination: General: Does not appear to be in distress, arouses to voice HENT: Pupils equal and reacting Lungs: Clear to auscultation bilaterally Cardiovascular: S1-S2 appreciated with no murmur Abdomen: Soft, bowel sounds appreciated Extremities: warm and well perfused; no edema Neuro: Opens eyes to voice. Left gaze preference, but able to cross midline.  Does not follow commands  Resolved Hospital Problem list     Assessment & Plan:  Left MCA stroke, unsuccessful revascularization -Neurological status remains unchanged -Continue telemetry monitoring -TTE without evidence of cardiac embolism -Off hypertonic saline  Acute respiratory failure secondary to inadequate airway protection -Tolerating pressure support ventilation -Inability to protect airway precludes being able to extubate -Will benefit from tracheostomy  Insulin-dependent diabetes with severe hyperglycemia -Continue SSI  Migraines Chronic pain and anxiety -Home medications including Xanax, Fioricet, gabapentin, Flexeril, tramadol on hold -Close monitoring for withdrawal symptoms  For tracheostomy   Best practice:  Diet: Tube feeds Pain/Anxiety/Delirium protocol (if indicated): fentanyl, propofol VAP protocol (if indicated): Y DVT prophylaxis: SCDs GI prophylaxis: protonix  Glucose control: SSI, Lantus Mobility: Bedrest Code Status: FULL Family Communication: per primary Disposition: ICU  Labs   CBC: Recent Labs  Lab 03/08/20 1151 03/08/20 1523 03/08/20 1544 03/09/20 1119 03/10/20 0449 03/11/20 0437 03/12/20 0441  WBC 24.2*  --   --  18.7* 13.1* 13.7* 13.3*  NEUTROABS  --   --   --  14.7* 9.1* 9.4* 9.2*  HGB 14.5   < > 14.3 12.1 11.4* 10.5* 10.5*  HCT 42.9   < > 42.0 37.3 37.0 35.1* 34.3*  MCV 97.1  --   --  100.0 105.7* 107.3* 105.9*  PLT 383  --   --  284 284 257 259   < > = values in this interval not displayed.    Basic Metabolic Panel: Recent Labs  Lab 03/08/20 1151  03/08/20 1523 03/08/20 1533 03/08/20 1533 03/08/20 1544 03/08/20 2010 03/09/20 0902 03/09/20 1119 03/10/20 0449 03/10/20 1320 03/10/20 1816 03/10/20 2330 03/11/20 0437 03/11/20 0437 03/11/20 1134 03/11/20 1457 03/11/20 1939 03/12/20 0441 03/12/20 0924  NA 135   < > 136   < > 139   < > 145   < > 155*   < > 160*   < > 155*   < > 154* 154* 153* 151*  150* 149*  K 4.0   < > 4.0   < > 3.9  --  4.7  --  4.7  --   --   --  3.7  --   --   --   --  3.9  --   CL 102   < > 103  --   --   --  115*  --  126*  --   --   --  126*  --   --   --   --  117*  --   CO2 21*   < > 20*  --   --   --  17*  --  22  --   --   --  22  --   --   --   --  24  --   GLUCOSE 251*   < >  174*  --   --   --  201*  --  189*  --   --   --  170*  --   --   --   --  256*  --   BUN 13   < > 13  --   --   --  10  --  9  --   --   --  12  --   --   --   --  16  --   CREATININE 1.11*   < > 1.02*  --   --   --  1.02*  --  0.97  --   --   --  0.98  --   --   --   --  0.93  --   CALCIUM 8.8*   < > 8.7*  --   --   --  8.1*  --  8.6*  --   --   --  8.7*  --   --   --   --  8.7*  --   MG 2.0  --   --   --   --   --   --   --   --   --  2.4  --  2.2  --   --   --  2.0  --   --   PHOS 3.6  --   --   --   --   --   --   --   --   --  2.9  --  3.5  --   --   --  3.3  --   --    < > = values in this interval not displayed.   GFR: Estimated Creatinine Clearance: 72.3 mL/min (by C-G formula based on SCr of 0.93 mg/dL). Recent Labs  Lab 03/09/20 1119 03/10/20 0449 03/11/20 0437 03/12/20 0441  WBC 18.7* 13.1* 13.7* 13.3*    Liver Function Tests: Recent Labs  Lab 03/08/20 1151  AST 24  ALT 30  ALKPHOS 169*  BILITOT 0.4  PROT 7.1  ALBUMIN 3.4*   No results for input(s): LIPASE, AMYLASE in the last 168 hours. No results for input(s): AMMONIA in the last 168 hours.  ABG    Component Value Date/Time   PHART 7.347 (L) 03/08/2020 1544   PCO2ART 39.2 03/08/2020 1544   PO2ART 103 03/08/2020 1544   HCO3 21.6 03/08/2020  1544   TCO2 23 03/08/2020 1544   ACIDBASEDEF 4.0 (H) 03/08/2020 1544   O2SAT 98.0 03/08/2020 1544     Coagulation Profile: Recent Labs  Lab 03/08/20 1151  INR 1.1    Cardiac Enzymes: No results for input(s): CKTOTAL, CKMB, CKMBINDEX, TROPONINI in the last 168 hours.  HbA1C: Hgb A1c MFr Bld  Date/Time Value Ref Range Status  03/09/2020 11:19 AM 10.7 (H) 4.8 - 5.6 % Final    Comment:    (NOTE) Pre diabetes:          5.7%-6.4%  Diabetes:              >6.4%  Glycemic control for   <7.0% adults with diabetes     CBG: Recent Labs  Lab 03/10/20 2304 03/11/20 0325 03/11/20 0754 03/11/20 1129 03/11/20 1523  GLUCAP 205* 166* 191* 263* 237*   The patient is critically ill with multiple organ systems failure and requires high complexity decision making for assessment and support, frequent evaluation and titration of therapies, application of advanced monitoring technologies  and extensive interpretation of multiple databases. Critical Care Time devoted to patient care services described in this note independent of APP/resident time (if applicable)  is 32 minutes.   Virl Diamond MD [facility] Personal pager: [phone] If unanswered, please page CCM On-call: #[phone]

## 2020-03-12 NOTE — Progress Notes (Signed)
Bedside tracheostomy to be placed tomorrow by Dr. Katrinka Blazing around 1400. A #6 cuffed Shiley to be placed. Verbal order received for tube feeds to be off at 0800. RN notified and supplies ordered. RT will continue to monitor.

## 2020-03-13 ENCOUNTER — Inpatient Hospital Stay (HOSPITAL_COMMUNITY): Payer: Medicaid Other

## 2020-03-13 DIAGNOSIS — J9601 Acute respiratory failure with hypoxia: Secondary | ICD-10-CM | POA: Diagnosis not present

## 2020-03-13 DIAGNOSIS — Z515 Encounter for palliative care: Secondary | ICD-10-CM | POA: Diagnosis not present

## 2020-03-13 DIAGNOSIS — Z7189 Other specified counseling: Secondary | ICD-10-CM | POA: Diagnosis not present

## 2020-03-13 DIAGNOSIS — I6602 Occlusion and stenosis of left middle cerebral artery: Secondary | ICD-10-CM | POA: Diagnosis not present

## 2020-03-13 DIAGNOSIS — I63512 Cerebral infarction due to unspecified occlusion or stenosis of left middle cerebral artery: Secondary | ICD-10-CM | POA: Diagnosis not present

## 2020-03-13 LAB — CBC WITH DIFFERENTIAL/PLATELET
Abs Immature Granulocytes: 0.11 10*3/uL — ABNORMAL HIGH (ref 0.00–0.07)
Basophils Absolute: 0.1 10*3/uL (ref 0.0–0.1)
Basophils Relative: 1 %
Eosinophils Absolute: 0.2 10*3/uL (ref 0.0–0.5)
Eosinophils Relative: 1 %
HCT: 32.8 % — ABNORMAL LOW (ref 36.0–46.0)
Hemoglobin: 10.2 g/dL — ABNORMAL LOW (ref 12.0–15.0)
Immature Granulocytes: 1 %
Lymphocytes Relative: 15 %
Lymphs Abs: 2.1 10*3/uL (ref 0.7–4.0)
MCH: 32 pg (ref 26.0–34.0)
MCHC: 31.1 g/dL (ref 30.0–36.0)
MCV: 102.8 fL — ABNORMAL HIGH (ref 80.0–100.0)
Monocytes Absolute: 1.1 10*3/uL — ABNORMAL HIGH (ref 0.1–1.0)
Monocytes Relative: 7 %
Neutro Abs: 10.8 10*3/uL — ABNORMAL HIGH (ref 1.7–7.7)
Neutrophils Relative %: 75 %
Platelets: 239 10*3/uL (ref 150–400)
RBC: 3.19 MIL/uL — ABNORMAL LOW (ref 3.87–5.11)
RDW: 14.2 % (ref 11.5–15.5)
WBC: 14.3 10*3/uL — ABNORMAL HIGH (ref 4.0–10.5)
nRBC: 0 % (ref 0.0–0.2)

## 2020-03-13 LAB — BASIC METABOLIC PANEL
Anion gap: 9 (ref 5–15)
BUN: 18 mg/dL (ref 6–20)
CO2: 25 mmol/L (ref 22–32)
Calcium: 8.8 mg/dL — ABNORMAL LOW (ref 8.9–10.3)
Chloride: 112 mmol/L — ABNORMAL HIGH (ref 98–111)
Creatinine, Ser: 0.78 mg/dL (ref 0.44–1.00)
GFR calc Af Amer: >60 mL/min (ref >60)
GFR calc non Af Amer: >60 mL/min (ref >60)
Glucose, Bld: 198 mg/dL — ABNORMAL HIGH (ref 70–99)
Potassium: 4 mmol/L (ref 3.5–5.1)
Sodium: 146 mmol/L — ABNORMAL HIGH (ref 135–145)

## 2020-03-13 LAB — SODIUM
Sodium: 147 mmol/L — ABNORMAL HIGH (ref 135–145)
Sodium: 145 mmol/L (ref 135–145)
Sodium: 150 mmol/L — ABNORMAL HIGH (ref 135–145)

## 2020-03-13 LAB — TRIGLYCERIDES: Triglycerides: 183 mg/dL — ABNORMAL HIGH (ref <150)

## 2020-03-13 MED ORDER — SODIUM CHLORIDE 3 % IV SOLN
INTRAVENOUS | Status: DC
  Filled 2020-03-13 (×5): qty 500

## 2020-03-13 MED ORDER — SODIUM CHLORIDE 23.4 % INJECTION (4 MEQ/ML) FOR IV ADMINISTRATION
120.0000 meq | Freq: Once | INTRAVENOUS | Status: AC
  Administered 2020-03-13: 120 meq via INTRAVENOUS
  Filled 2020-03-13: qty 30

## 2020-03-13 NOTE — Procedures (Signed)
Percutaneous Tracheostomy Procedure Note   Deborah [name]  [id number]  24-May-1971  Date:03/13/20  Time:2:24 PM   Provider Performing:Aceson Labell C Katrinka Blazing  Procedure: Percutaneous Tracheostomy with Bronchoscopic Guidance (90931)  Indication(s) Persistent resp failure  Consent Risks of the procedure as well as the alternatives and risks of each were explained to the patient and/or caregiver.  Consent for the procedure was obtained.  Anesthesia Etomidate, Versed, Fentanyl, Vecuronium   Time Out Verified patient identification, verified procedure, site/side was marked, verified correct patient position, special equipment/implants available, medications/allergies/relevant history reviewed, required imaging and test results available.   Sterile Technique Maximal sterile technique including sterile barrier drape, hand hygiene, sterile gown, sterile gloves, mask, hair covering.    Procedure Description Appropriate anatomy identified by palpation.  Patient's neck prepped and draped in sterile fashion.  1% lidocaine with epinephrine was used to anesthetize skin overlying neck.  1.5cm incision made and blunt dissection performed until tracheal rings could be easily palpated.   Then a size 6-0 Shiley tracheostomy was placed under bronchoscopic visualization using usual Seldinger technique and serial dilation.   Bronchoscope confirmed placement above the carina.  Tracheostomy was sutured in place with adhesive pad to protect skin under pressure.    Patient connected to ventilator.   Complications/Tolerance None; patient tolerated the procedure well. Chest X-ray is ordered to confirm no post-procedural complication.   EBL Minimal   Specimen(s) None

## 2020-03-13 NOTE — Progress Notes (Signed)
RT assisted Dr. Katrinka Blazing and Dr. Wynona Neat with a bedside tracheostomy. A #6 cuffed Shiley was placed without difficulty. Placement verified with bronchoscope by Dr. Wynona Neat. RT will continue to monitor. CXR pending.

## 2020-03-13 NOTE — Progress Notes (Signed)
Updated family member, Jason-patient's significant other about tracheostomy status Tolerated procedure well

## 2020-03-13 NOTE — Progress Notes (Signed)
PT acknowledges new acute PT order. Pt is already on PT caseload. PT will follow up per established plan of care.  Arlyss Gandy, PT, DPT Acute Rehabilitation Pager: [phone]

## 2020-03-13 NOTE — Progress Notes (Signed)
NAME:  Deborah [name], MRN:  [id number], DOB:  [birth year], LOS: 5 ADMISSION DATE:  03/08/2020, CONSULTATION DATE:  03/08/20 REFERRING MD:  Neurology, CHIEF COMPLAINT:  CVA    Brief History   49 y/o female with history of IDDM, HLD, tobacco use disorder presented to [facility] for facial droop, right sided weakness and global aphasia. Found to have LVO of left M1 segment and was transferred to [facility] for attempt at endovascular revascularization.   History of present illness   49 y/o female presented after being found by her husband this morning with facial droop, right sided weakness, and global aphasia. LKN was 8:00 pm the night before. NIH score of 21 on arrival. She was immediately taken to IR and intubated for attempt at endovascular revascularization. Unfortunately, she had immediate reocclusion after achieving TICI 2c revascularization. Rescue stent not performed due to increased risk of ICH. She was transferred to the ICU for continued monitoring post-procedure.   Past Medical History  IDDM HLD Tobacco use Migraine headaches Chronic pain  Anxiety   Significant Hospital Events   9/21 > intubated for IR procedure 9/21> unsuccessful revascularization 9/21> admitted to neuro ICU  Consults:  PCCM Neurosurgery   Procedures:  9/21>cerebral arteriogram with emergent mechanical thrombectomy   Significant Diagnostic Tests:  9/21 CT head > Acute left MCA territory infarct without hemorrhagic conversion or progression from CT earlier today. Aspects remains 6  9/22 at 1 am CT head >Continued interval evolution of large left MCA territory infarct, overall similar in size and distribution from previous. Associated regional mass effect with partial effacement of the left lateral ventricle has mildly worsened, with worsened left-to-right midline shift now measuring up to 5 mm 9/22 at 7:50 am CT head > Unchanged extent of a large acute/early subacute left MCA vascular territory  infarct and associated hemorrhage as compared to the head CT performed earlier the same day at 12:57 a.m. Continued slight interval increase in mass effect with partial effacement of the left lateral ventricle and now 6 mm rightward midline shift (previously 5 mm). The basal cisterns remain patent. No evidence of ventricular entrapment. 9/22 MRI brain > Large territory infarct left MCA territory with moderate hemorrhage and mild subarachnoid hemorrhage. There is edema and mass-effect with 7 mm midline shift to the right. Left MCA is occluded in the M1 segment distal to an early temporal branch. No other intracranial stenosis 9/24 cth: Acute left MCA territory infarct with mildly progressive swelling (9 mm midline shift) and regressed hemorrhage.   Micro Data:  N/A  Antimicrobials:  N/A   Interim history/subjective:   9/24: pt remains on vent on light sedation awake but able to utilize extremities to follow commands. Does hold eyes shut on command. Unable to perform tongue thrust. Pt and family would benefit from palliative consult, d/w Dr Roda Shutters (he will speak with pt's boyfriend who son is deferring decisions to). 3% stopped overnight 2/2 Na >155. Neurosx signing off despite some progressive shift on imaging.  9/25: Planning for tracheostomy today-this is rescheduled  9/26: No overnight events  Objective   Blood pressure 136/61, pulse 60, temperature 99.8 F (37.7 C), temperature source Axillary, resp. rate 19, height 5' (1.524 m), weight 91.2 kg, SpO2 100 %.    Vent Mode: PRVC FiO2 (%):  [40 %] 40 % Set Rate:  [18 bmp] 18 bmp Vt Set:  [370 mL] 370 mL PEEP:  [5 cmH20] 5 cmH20 Pressure Support:  [8 cmH20] 8 cmH20   Intake/Output Summary (Last  24 hours) at 03/13/2020 1145 Last data filed at 03/13/2020 0800 Gross per 24 hour  Intake 1859.92 ml  Output 1805 ml  Net 54.92 ml   Filed Weights   03/11/20 0500 03/12/20 0500 03/13/20 0500  Weight: 84.4 kg 84.7 kg 91.2 kg     Examination: General: Does not appear to be in distress HENT: Pupils equal and reactive Lungs: Clear breath sounds bilaterally Cardiovascular: S1-S2 appreciated with no murmur Abdomen: Soft, bowel sounds appreciated Extremities: No edema Neuro: Opens eyes to voice. Left gaze preference, but able to cross midline.  Does not follow commands  Resolved Hospital Problem list     Assessment & Plan:  Left MCA stroke, unsuccessful revascularization -Neuro status remains unchanged -Off hypertonic saline -Continue telemetry monitoring  Acute respiratory failure secondary to inadequate airway protection -Will benefit from tracheostomy -Tolerating pressure support ventilation -Inability to protect airway precludes been able to extubate safely  Insulin-dependent diabetes with severe hyperglycemia -Continue SSI  Migraines Chronic pain and anxiety -Home medications that may contribute to somnolence being held at present-Home meds include Xanax, Fioricet, gabapentin, Flexeril, tramadol   For tracheostomy tube placement  Best practice:  Diet: Tube feeds Pain/Anxiety/Delirium protocol (if indicated): fentanyl, propofol VAP protocol (if indicated): Y DVT prophylaxis: SCDs GI prophylaxis: protonix  Glucose control: SSI, Lantus Mobility: Bedrest Code Status: FULL Family Communication: per primary Disposition: ICU  Labs   CBC: Recent Labs  Lab 03/09/20 1119 03/10/20 0449 03/11/20 0437 03/12/20 0441 03/13/20 0400  WBC 18.7* 13.1* 13.7* 13.3* 14.3*  NEUTROABS 14.7* 9.1* 9.4* 9.2* 10.8*  HGB 12.1 11.4* 10.5* 10.5* 10.2*  HCT 37.3 37.0 35.1* 34.3* 32.8*  MCV 100.0 105.7* 107.3* 105.9* 102.8*  PLT 284 284 257 259 239    Basic Metabolic Panel: Recent Labs  Lab 03/08/20 1151 03/08/20 1523 03/09/20 0902 03/09/20 1119 03/10/20 0449 03/10/20 1320 03/10/20 1816 03/10/20 2330 03/11/20 0437 03/11/20 1134 03/11/20 1939 03/11/20 1939 03/12/20 0441 03/12/20 0441  03/12/20 0924 03/12/20 1624 03/12/20 2100 03/13/20 0245 03/13/20 0400  NA 135   < > 145   < > 155*   < > 160*   < > 155*   < > 153*   < > 151*  150*   < > 149* 148* 149* 147* 146*  K 4.0   < > 4.7  --  4.7  --   --   --  3.7  --   --   --  3.9  --   --   --   --   --  4.0  CL 102   < > 115*  --  126*  --   --   --  126*  --   --   --  117*  --   --   --   --   --  112*  CO2 21*   < > 17*  --  22  --   --   --  22  --   --   --  24  --   --   --   --   --  25  GLUCOSE 251*   < > 201*  --  189*  --   --   --  170*  --   --   --  256*  --   --   --   --   --  198*  BUN 13   < > 10  --  9  --   --   --  12  --   --   --  16  --   --   --   --   --  18  CREATININE 1.11*   < > 1.02*  --  0.97  --   --   --  0.98  --   --   --  0.93  --   --   --   --   --  0.78  CALCIUM 8.8*   < > 8.1*  --  8.6*  --   --   --  8.7*  --   --   --  8.7*  --   --   --   --   --  8.8*  MG 2.0  --   --   --   --   --  2.4  --  2.2  --  2.0  --   --   --   --   --   --   --   --   PHOS 3.6  --   --   --   --   --  2.9  --  3.5  --  3.3  --   --   --   --   --   --   --   --    < > = values in this interval not displayed.   GFR: Estimated Creatinine Clearance: 87.6 mL/min (by C-G formula based on SCr of 0.78 mg/dL). Recent Labs  Lab 03/10/20 0449 03/11/20 0437 03/12/20 0441 03/13/20 0400  WBC 13.1* 13.7* 13.3* 14.3*    Liver Function Tests: Recent Labs  Lab 03/08/20 1151  AST 24  ALT 30  ALKPHOS 169*  BILITOT 0.4  PROT 7.1  ALBUMIN 3.4*   No results for input(s): LIPASE, AMYLASE in the last 168 hours. No results for input(s): AMMONIA in the last 168 hours.  ABG    Component Value Date/Time   PHART 7.347 (L) 03/08/2020 1544   PCO2ART 39.2 03/08/2020 1544   PO2ART 103 03/08/2020 1544   HCO3 21.6 03/08/2020 1544   TCO2 23 03/08/2020 1544   ACIDBASEDEF 4.0 (H) 03/08/2020 1544   O2SAT 98.0 03/08/2020 1544     Coagulation Profile: Recent Labs  Lab 03/08/20 1151  INR 1.1    Cardiac  Enzymes: No results for input(s): CKTOTAL, CKMB, CKMBINDEX, TROPONINI in the last 168 hours.  HbA1C: Hgb A1c MFr Bld  Date/Time Value Ref Range Status  03/09/2020 11:19 AM 10.7 (H) 4.8 - 5.6 % Final    Comment:    (NOTE) Pre diabetes:          5.7%-6.4%  Diabetes:              >6.4%  Glycemic control for   <7.0% adults with diabetes     CBG: Recent Labs  Lab 03/10/20 2304 03/11/20 0325 03/11/20 0754 03/11/20 1129 03/11/20 1523  GLUCAP 205* 166* 191* 263* 237*   The patient is critically ill with multiple organ systems failure and requires high complexity decision making for assessment and support, frequent evaluation and titration of therapies, application of advanced monitoring technologies and extensive interpretation of multiple databases. Critical Care Time devoted to patient care services described in this note independent of APP/resident time (if applicable)  is 30 minutes.   Virl Diamond MD [place] [facility] Personal pager: [phone] If unanswered, please page CCM On-call: #[phone]

## 2020-03-13 NOTE — Procedures (Signed)
Diagnostic Bronchoscopy Procedure Note   Deborah [name]  [id number]  15-Dec-1970  Date:03/13/20  Time:2:30 PM   Provider Performing:Duha Abair A Citlalic Norlander  Procedure: Diagnostic Bronchoscopy (30092)  Indication(s) Assist with direct visualization of tracheostomy placement  Consent Risks of the procedure as well as the alternatives and risks of each were explained to the patient and/or caregiver.  Consent for the procedure was obtained.   Anesthesia See separate tracheostomy note   Time Out Verified patient identification, verified procedure, site/side was marked, verified correct patient position, special equipment/implants available, medications/allergies/relevant history reviewed, required imaging and test results available.   Sterile Technique Usual hand hygiene, masks, gowns, and gloves were used   Procedure Description Bronchoscope advanced through endotracheal tube and into airway.  After suctioning out tracheal secretions, bronchoscope used to provide direct visualization of tracheostomy placement.   Complications/Tolerance None; patient tolerated the procedure well..   EBL None   Specimen(s) None

## 2020-03-13 NOTE — Progress Notes (Signed)
STROKE TEAM PROGRESS NOTE   INTERVAL HISTORY No one at bedside.  Patient still intubated, neuro stable, open eyes on voice, seems able to follow occasional midline commands, not peripheral commands.  Sodium 146,  .   .  I also discussed with CCM attending Dr. Wynona Neat and Dr. Katrinka Blazing, and the plan to do trach this afternoon .  Vital signs are stable.  CT scan of the head this morning shows stable large left MCA infarct with small volume blood products in stable 9 mm left-to-right shift.   Vitals:   03/13/20 0500 03/13/20 0600 03/13/20 0700 03/13/20 0800  BP: 134/68 120/64 130/69 136/61  Pulse: 60 (!) 59 62 60  Resp: 19 19 18 19   Temp:    99.8 F (37.7 C)  TempSrc:    Axillary  SpO2: 100% 100% 100% 100%  Weight: 91.2 kg     Height:       CBC:  Recent Labs  Lab 03/12/20 0441 03/13/20 0400  WBC 13.3* 14.3*  NEUTROABS 9.2* 10.8*  HGB 10.5* 10.2*  HCT 34.3* 32.8*  MCV 105.9* 102.8*  PLT 259 239   Basic Metabolic Panel:  Recent Labs  Lab 03/11/20 0437 03/11/20 0437 03/11/20 1134 03/11/20 1939 03/12/20 0441 03/12/20 0924 03/13/20 0245 03/13/20 0400  NA 155*   < >   < > 153* 151*  150*   < > 147* 146*  K 3.7   < >  --   --  3.9  --   --  4.0  CL 126*   < >  --   --  117*  --   --  112*  CO2 22   < >  --   --  24  --   --  25  GLUCOSE 170*   < >  --   --  256*  --   --  198*  BUN 12   < >  --   --  16  --   --  18  CREATININE 0.98   < >  --   --  0.93  --   --  0.78  CALCIUM 8.7*   < >  --   --  8.7*  --   --  8.8*  MG 2.2  --   --  2.0  --   --   --   --   PHOS 3.5  --   --  3.3  --   --   --   --    < > = values in this interval not displayed.   Lipid Panel:  Recent Labs  Lab 03/09/20 0244 03/10/20 0449 03/13/20 0400  CHOL 288*  --   --   TRIG 371*   < > 183*  HDL 40*  --   --   CHOLHDL 7.2  --   --   VLDL 74*  --   --   LDLCALC 174*  --   --    < > = values in this interval not displayed.   HgbA1c:  Recent Labs  Lab 03/09/20 1119  HGBA1C 10.7*   Urine  Drug Screen:  Recent Labs  Lab 03/09/20 0300  LABOPIA NONE DETECTED  COCAINSCRNUR NONE DETECTED  LABBENZ POSITIVE*  AMPHETMU NONE DETECTED  THCU NONE DETECTED  LABBARB POSITIVE*    Alcohol Level No results for input(s): ETH in the last 168 hours.  IMAGING past 24 hours CT HEAD WO CONTRAST  Result Date: 03/13/2020 CLINICAL DATA:  Stroke follow-up EXAM: CT HEAD WITHOUT CONTRAST TECHNIQUE: Contiguous axial images were obtained from the base of the skull through the vertex without intravenous contrast. COMPARISON:  Two days ago FINDINGS: Brain: Early subacute left MCA territory infarct with swelling and small volume basal ganglia blood products. Midline shift measures 9 mm, unchanged. No entrapment or infarct progression. No new site of infarction seen. Vascular: No hyperdense vessel or unexpected calcification. Skull: Normal. Negative for fracture or focal lesion. Sinuses/Orbits: No acute finding. IMPRESSION: Stable extent of left MCA territory infarct with small volume blood products. Midline shift continues to measure 9 mm. Electronically Signed   By: Marnee SpringJonathon  Watts M.D.   On: 03/13/2020 07:56    PHYSICAL EXAM  Temp:  [98.3 F (36.8 C)-100.2 F (37.9 C)] 99.8 F (37.7 C) (09/26 0800) Pulse Rate:  [59-84] 60 (09/26 0800) Resp:  [15-27] 19 (09/26 0800) BP: (120-166)/(54-80) 136/61 (09/26 0800) SpO2:  [97 %-100 %] 100 % (09/26 0800) FiO2 (%):  [40 %] 40 % (09/26 0800) Weight:  [91.2 kg] 91.2 kg (09/26 0500)  General -mildly obese middle-aged Caucasian female, intubated on sedation.  Ophthalmologic - fundi not visualized due to noncooperation.  Cardiovascular - Regular rate and rhythm.  Neuro - intubated on sedation, eyes open on voice, seem to able to follow eye opening and closure, still not following peripheral commands. With eye opening, eyes in left gaze preference but able to cross midline briefly, right gaze incomplete, not blinking to visual threat on the right, doll's eyes  present, tracking on the left not on the right. Pupil right 4mm and left 2.135mm, under dark more prominent, consistent with left horner syndrome. Corneal reflex present, gag and cough present. Breathing over the vent.  Facial symmetry not able to test due to ET tube.  Tongue protrusion not cooperative. Able to hold LUE against gravity without drift, LLE withdraw to pain and able to hold knee flexion and foot in bed position without drift. RUE flaccid and RLE mild withdraw to pain but increased muscle tone, not able to hold on knee flexion and foot in bed position. DTR 1+ and no babinski. Sensation, coordination and gait not tested.   ASSESSMENT/PLAN Ms. Quintin AltoSylvia Couchman is a 49 y.o. female with history of difficulty to control DB, HLD, syncope [facility] with R hemiparesis, facial droop, left gaze, and global aphasia w/ glucose 400s where she was found to have L M1 occlusion and [facility].   Stroke:   L MCA infarct s/p IR w/ post procedure hemorrhage, infarct secondary to large vessel disease source  CT head [facility]) L MCA infarct w/ ASPECTS 6  CTA head & neck [facility]) L M2 cutoff  CT head L MCA territory infarct. ASPECTS 6.   CT perfusion 63 core L brain same as CT. 32 penumbra. 1.5 mismatch ratio.  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 Post IR hemorrhage.  CT head 9/21 1818 increased L MCA ischemic infarct w/ mass effect and partial effacement L lateral ventrilc w/ 4mm midline shift. Small foci L BG HT same w/ increased L perisylvian and L cerebral convexity hemorrhage.   CT head 9/22 0151 evolution large L MCA infarct w/ slightly worse effacement and mass effect w/ 5mm midline shift. Scattered foci hemorrhage in infarct same w/ contrast clearing. L M1 and proximal L MCA branches w/ persistent hyperdensity.   CT head 9/22 0827  L MCA  infarct and hemorrhages same. Increase in mass effect and effacement now 110mm midline shift. L M1 and proximal L MCA braches w/ persistent hyperdensity.   MRI 9/22 Large L MCA infarct w/ moderate infarct and SAH. Edema, 29mm midline shift.   MRA 9/22 L MCA 1 occlusion   CT head 9/24 L MCA infarct w/ mild increase in edema, 27mm midline shift    CT repeat 9/26 - Stable extent of left MCA territory infarct with small volume blood products. Midline shift continues to measure 9 mm.  2D Echo EF 60-65%. No source of embolus   Recent Zio patch Normal w/ infrequent PACs, PVCs 01/07/2020  LDL 174   HgbA1c 10.7   VTE prophylaxis - Lovenox 40 mg sq daily    clopidogrel 75 mg daily prior to admission, now on No antithrombotic given post IR hemorrhage. Repeat CT in am, if hemorrhage stable, will consider ASA 81.   Therapy recommendations:  Skilled Nursing Facility recommended  Disposition:  pending   Acute Respiratory Failure  Secondary to stroke  Intubated for IR, left intubated post IR    Off sedation  On weaning trial  CCM on board - feel pt needs trach  Discussed with son and boyfriend, they are in agreement to proceed  Trach planned for this afternoon 03/13/20   Cerebral Edema  MRI 9/22 with MLS @ 59mm  CT head 9/24 L MCA infarct w/ mild increase in edema, 63mm midline shift    CT repeat 9/26 - Stable extent of left MCA territory infarct with small volume blood products. Midline shift continues to measure 9 mm.  On 3% protocol - @75 ->50->NS  23.4% x 1 (9/25)  Goal Na 150-160  Na 154->153->151->149->147->146  NS onboard 09-29-1970) - no hemicrani needed at this time    HOB > 30  Has PICC  Fever  LUE Shivering  Tmax 100->101.4->101.3->afebrile->99.8 ax  Leukocytosis WBC 24.2->18.7->13.1->13.7->13.3->14.3  EEG L slowing, no sz  UA -> not c/w UTI  CXR - 03/11/20 - no evidence of active pulmonary disease  R Groin Firmness, improved  R distal pulse 1+  Groin  Doppler R common femoral patent, no pseudoaneurysm, AVF or DVT.   Continue monitoring  Blood Pressure  Home meds:  None, no hx HTN  Stable . BP goal < 160 for now due to HT   . Off Cleviprex gtt  . Long-term BP goal normotensive  Hyperlipidemia  Home meds:  lipitor 20  LDL 174, goal < 70  On lipitor 80  Continue statin at discharge  Diabetes type II Uncontrolled  Home meds:  Glimepiride , lantus 21, liraglutide 1.8  HgbA1c 10.7, goal < 7.0  CBGs  Hyperglycemia improved  SSI 0-15  On lantus 28  DM coordinator on board  Dysphagia . Secondary to stroke . NPO except meds . on TF @ 47 via cortrak  . On IVF @ 52 . Speech on board  Tobacco abuse  Current smoker  Will provide smoking cessation when able    Urinary retention  Off Foley catheter after procedure  Needed I&O x4-5  Put back Foley catheter 9/25  On Urecholine  Other Stroke Risk Factors  UDS positive benzos and barb (done following IR procedure)   Obesity, Body mass index is 39.27 kg/m., recommend weight loss, diet and exercise as appropriate   Family hx stroke (paternal grandfather)  Migraines on fioricet  Other Active Problems  Chronic pain, Anxiety  Acute blood loss anemia s/p IR, Hgb 14.3->12.1->11.4->10.5->10.5->10.2  Palliative Care consult 03/12/20 - per pt's son - patient has stated she would not want life prolonged in vegetative state or with significant loss of function - would like to allow time for outcomes prior to making further decisions.    Hospital day # 5  Plan tracheostomy today by pulmonary critical care team.  Start hypertonic saline at 75 cc an hour with serum sodium goal 1 50-1 55.  Give 30 cc of 23.4% bolus.  Discussed with Dr. Katrinka Blazing critical care medicine and Dr. Val Eagle. This patient is critically ill and at significant risk of neurological worsening, death and care requires constant monitoring of vital signs, hemodynamics,respiratory and cardiac monitoring,  extensive review of multiple databases, frequent neurological assessment, discussion with family, other specialists and medical decision making of high complexity.I have made any additions or clarifications directly to the above note.This critical care time does not reflect procedure time, or teaching time or supervisory time of PA/NP/Med Resident etc but could involve care discussion time.  I spent 30 minutes of neurocritical care time  in the care of  this patient.    Delia Heady, MD      To contact Stroke Continuity provider, please refer to [email or web address]. After hours, contact General Neurology

## 2020-03-13 NOTE — Progress Notes (Addendum)
SLP Cancellation Note  Patient Details Name: Deborah [name] MRN: [id number] DOB: 02-Jan-1971   Cancelled treatment:       Reason Eval/Treat Not Completed: Patient not medically ready.  Orders for speaking valve evaluation and BSE received and acknowledged.  Pt underwent tracheostomy this afternoon with trach connected to the ventilator per procedure note today at 14:24.  SLP team spoke with RN and RT who reported that the pt has been weaning well with low O2 requirements, therefore will f/u for PMV evaluation when pt is tolerating trach collar vs in-line PMV evaluation.  SLP will additionally f/u for BSE and SLE as appropriate.    Shanon Rosser Aleyah Balik 03/13/2020, 2:32 PM

## 2020-03-14 DIAGNOSIS — I63311 Cerebral infarction due to thrombosis of right middle cerebral artery: Secondary | ICD-10-CM | POA: Diagnosis not present

## 2020-03-14 DIAGNOSIS — I63512 Cerebral infarction due to unspecified occlusion or stenosis of left middle cerebral artery: Secondary | ICD-10-CM | POA: Diagnosis not present

## 2020-03-14 LAB — SODIUM
Sodium: 153 mmol/L — ABNORMAL HIGH (ref 135–145)
Sodium: 154 mmol/L — ABNORMAL HIGH (ref 135–145)
Sodium: 156 mmol/L — ABNORMAL HIGH (ref 135–145)
Sodium: 151 mmol/L — ABNORMAL HIGH (ref 135–145)

## 2020-03-14 LAB — GLUCOSE, CAPILLARY
Glucose-Capillary: 263 mg/dL — ABNORMAL HIGH (ref 70–99)
Glucose-Capillary: 182 mg/dL — ABNORMAL HIGH (ref 70–99)
Glucose-Capillary: 224 mg/dL — ABNORMAL HIGH (ref 70–99)
Glucose-Capillary: 244 mg/dL — ABNORMAL HIGH (ref 70–99)
Glucose-Capillary: 220 mg/dL — ABNORMAL HIGH (ref 70–99)
Glucose-Capillary: 219 mg/dL — ABNORMAL HIGH (ref 70–99)
Glucose-Capillary: 228 mg/dL — ABNORMAL HIGH (ref 70–99)
Glucose-Capillary: 240 mg/dL — ABNORMAL HIGH (ref 70–99)
Glucose-Capillary: 201 mg/dL — ABNORMAL HIGH (ref 70–99)
Glucose-Capillary: 174 mg/dL — ABNORMAL HIGH (ref 70–99)
Glucose-Capillary: 132 mg/dL — ABNORMAL HIGH (ref 70–99)
Glucose-Capillary: 152 mg/dL — ABNORMAL HIGH (ref 70–99)
Glucose-Capillary: 173 mg/dL — ABNORMAL HIGH (ref 70–99)
Glucose-Capillary: 181 mg/dL — ABNORMAL HIGH (ref 70–99)
Glucose-Capillary: 197 mg/dL — ABNORMAL HIGH (ref 70–99)
Glucose-Capillary: 163 mg/dL — ABNORMAL HIGH (ref 70–99)
Glucose-Capillary: 121 mg/dL — ABNORMAL HIGH (ref 70–99)
Glucose-Capillary: 143 mg/dL — ABNORMAL HIGH (ref 70–99)

## 2020-03-14 NOTE — Progress Notes (Signed)
STROKE TEAM PROGRESS NOTE   INTERVAL HISTORY Patient had tracheostomy done yesterday.  She remains on ventilatory support.  She has been started on hypertonic saline and serum sodium is 153 this morning on 3% saline at 75 cc an hour.  She seems more alert but remains aphasic and follows only few occasional commands.  Blood pressure adequately controlled.  Vital signs are stable.  Vitals:   03/14/20 0600 03/14/20 0700 03/14/20 0748 03/14/20 0800  BP: (!) 141/73 (!) 142/59 (!) 143/78   Pulse: 75 69 77   Resp: 18 18 (!) 22   Temp:    98.5 F (36.9 C)  TempSrc:    Axillary  SpO2: 98% 99% 99%   Weight:      Height:       CBC:  Recent Labs  Lab 03/12/20 0441 03/13/20 0400  WBC 13.3* 14.3*  NEUTROABS 9.2* 10.8*  HGB 10.5* 10.2*  HCT 34.3* 32.8*  MCV 105.9* 102.8*  PLT 259 239   Basic Metabolic Panel:  Recent Labs  Lab 03/11/20 0437 03/11/20 0437 03/11/20 1134 03/11/20 1939 03/12/20 0441 03/12/20 0924 03/13/20 0400 03/13/20 1316 03/13/20 2057 03/14/20 0246  NA 155*   < >   < > 153* 151*  150*   < > 146*   < > 150* 153*  K 3.7   < >  --   --  3.9  --  4.0  --   --   --   CL 126*   < >  --   --  117*  --  112*  --   --   --   CO2 22   < >  --   --  24  --  25  --   --   --   GLUCOSE 170*   < >  --   --  256*  --  198*  --   --   --   BUN 12   < >  --   --  16  --  18  --   --   --   CREATININE 0.98   < >  --   --  0.93  --  0.78  --   --   --   CALCIUM 8.7*   < >  --   --  8.7*  --  8.8*  --   --   --   MG 2.2  --   --  2.0  --   --   --   --   --   --   PHOS 3.5  --   --  3.3  --   --   --   --   --   --    < > = values in this interval not displayed.   Lipid Panel:  Recent Labs  Lab 03/09/20 0244 03/10/20 0449 03/13/20 0400  CHOL 288*  --   --   TRIG 371*   < > 183*  HDL 40*  --   --   CHOLHDL 7.2  --   --   VLDL 74*  --   --   LDLCALC 174*  --   --    < > = values in this interval not displayed.   HgbA1c:  Recent Labs  Lab 03/09/20 1119  HGBA1C 10.7*    Urine Drug Screen:  Recent Labs  Lab 03/09/20 0300  LABOPIA NONE DETECTED  COCAINSCRNUR NONE DETECTED  LABBENZ POSITIVE*  AMPHETMU NONE  DETECTED  THCU NONE DETECTED  LABBARB POSITIVE*    Alcohol Level No results for input(s): ETH in the last 168 hours.  IMAGING past 24 hours DG Chest Port 1 View  Result Date: 03/13/2020 CLINICAL DATA:  Tracheostomy tube placement. EXAM: PORTABLE CHEST 1 VIEW COMPARISON:  03/11/2020 FINDINGS: The endotracheal tube is been removed. The tracheostomy tube is in good position with its tip in the mid tracheal level. Stable feeding tube coursing down the esophagus and into the stomach. The left PICC line is stable. New small bilateral pleural effusions and new moderate right basilar atelectasis. No pulmonary edema or pneumothorax. IMPRESSION: 1. Tracheostomy tube in good position without complicating features. 2. New small bilateral pleural effusions and bibasilar atelectasis, right greater than left. Electronically Signed   By: Rudie MeyerP.  Gallerani M.D.   On: 03/13/2020 14:54    PHYSICAL EXAM    General -mildly obese middle-aged Caucasian female, s/p tracheostomy.  Ophthalmologic - fundi not visualized due to noncooperation.  Cardiovascular - Regular rate and rhythm.  Neuro -s/p tracheostomy on sedation, eyes open on voice, seem to able to follow eye opening and closure, still not following peripheral commands. With eye opening, eyes in left gaze preference but able to cross midline briefly, right gaze incomplete, not blinking to visual threat on the right, doll's eyes present, tracking on the left not on the right. Pupil right 4mm and left 2.225mm, under dark more prominent, consistent with left horner syndrome. Corneal reflex present, gag and cough present. Breathing over the vent.  Facial symmetry not able to test due to ET tube.  Tongue protrusion not cooperative. Able to hold LUE against gravity without drift, LLE withdraw to pain and able to hold knee flexion and  foot in bed position without drift. RUE flaccid and RLE mild withdraw to pain but increased muscle tone, not able to hold on knee flexion and foot in bed position. DTR 1+ and no babinski. Sensation, coordination and gait not tested.   ASSESSMENT/PLAN Ms. Quintin AltoSylvia Granados is a 49 y.o. female with history of difficulty to control DB, HLD, syncope presenting [facility] with R hemiparesis, facial droop, left gaze, and global aphasia w/ glucose 400s where she was found to have L M1 occlusion and [facility].   Stroke:   L MCA infarct s/p IR w/ post procedure hemorrhage, infarct secondary to large vessel disease source  CT head [facility] L MCA infarct w/ ASPECTS 6  CTA head & neck [facility]( [facility]) L M2 cutoff  CT head L MCA territory infarct. ASPECTS 6.   CT perfusion 63 core L brain same as CT. 32 penumbra. 1.5 mismatch ratio.  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 Post IR hemorrhage.  CT head 9/21 1818 increased L MCA ischemic infarct w/ mass effect and partial effacement L lateral ventrilc w/ 4mm midline shift. Small foci L BG HT same w/ increased L perisylvian and L cerebral convexity hemorrhage.   CT head 9/22 0151 evolution large L MCA infarct w/ slightly worse effacement and mass effect w/ 5mm midline shift. Scattered foci hemorrhage in infarct same w/ contrast clearing. L M1 and proximal L MCA branches w/ persistent hyperdensity.   CT head 9/22 0827  L MCA infarct and hemorrhages same. Increase in mass effect and effacement now 6mm midline shift. L M1 and proximal L MCA braches w/ persistent hyperdensity.   MRI 9/22 Large L  MCA infarct w/ moderate infarct and SAH. Edema, 53mm midline shift.   MRA 9/22 L MCA 1 occlusion   CT head 9/24 L MCA infarct w/ mild increase in edema, 58mm midline shift    CT repeat 9/26 - Stable extent of left MCA territory  infarct with small volume blood products. Midline shift continues to measure 9 mm.  2D Echo EF 60-65%. No source of embolus   Recent Zio patch Normal w/ infrequent PACs, PVCs 01/07/2020  LDL 174   HgbA1c 10.7   VTE prophylaxis - Lovenox 40 mg sq daily    clopidogrel 75 mg daily prior to admission, now on No antithrombotic given post IR hemorrhage. Repeat CT in am, if hemorrhage stable, will consider ASA 81.   Therapy recommendations:  Skilled Nursing Facility recommended  Disposition:  pending   Acute Respiratory Failure  Secondary to stroke  Intubated for IR, left intubated post IR    Off sedation  On weaning trial  CCM on board - feel pt needs trach  Discussed with son and boyfriend, they are in agreement to proceed  Trach placed 03/13/20   Cerebral Edema  MRI 9/22 with MLS @ 60mm  CT head 9/24 L MCA infarct w/ mild increase in edema, 52mm midline shift    CT repeat 9/26 - Stable extent of left MCA territory infarct with small volume blood products. Midline shift continues to measure 9 mm.  On 3% protocol - @75 ->50->NS  23.4% x 1 (9/25)  Goal Na 150-160  Na 154->153->151->149->147->146  NS onboard 09-29-1970) - no hemicrani needed at this time    HOB > 30  Has PICC  Fever  LUE Shivering  Tmax 100->101.4->101.3->afebrile->99.8 ax  Leukocytosis WBC 24.2->18.7->13.1->13.7->13.3->14.3  EEG L slowing, no sz  UA -> not c/w UTI  CXR - 03/11/20 - no evidence of active pulmonary disease  R Groin Firmness, improved  R distal pulse 1+  Groin Doppler R common femoral patent, no pseudoaneurysm, AVF or DVT.   Continue monitoring  Blood Pressure  Home meds:  None, no hx HTN  Stable . BP goal < 160 for now due to HT   . Off Cleviprex gtt  . Long-term BP goal normotensive  Hyperlipidemia  Home meds:  lipitor 20  LDL 174, goal < 70  On lipitor 80  Continue statin at discharge  Diabetes type II Uncontrolled  Home meds:  Glimepiride , lantus 21,  liraglutide 1.8  HgbA1c 10.7, goal < 7.0  CBGs  Hyperglycemia improved  SSI 0-15  On lantus 28  DM coordinator on board  Dysphagia . Secondary to stroke . NPO except meds . on TF @ 47 via cortrak  . On IVF @ 52 . Speech on board  Tobacco abuse  Current smoker  Will provide smoking cessation when able    Urinary retention  Off Foley catheter after procedure  Needed I&O x4-5  Put back Foley catheter 9/25  On Urecholine  Other Stroke Risk Factors  UDS positive benzos and barb (done following IR procedure)   Obesity, Body mass index is 39.4 kg/m., recommend weight loss, diet and exercise as appropriate   Family hx stroke (paternal grandfather)  Migraines on fioricet  Other Active Problems  Chronic pain, Anxiety  Acute blood loss anemia s/p IR, Hgb 14.3->12.1->11.4->10.5->10.5->10.2  Palliative Care consult 03/12/20 - per pt's son - patient has stated she would not want life prolonged in vegetative state or with significant loss of function - would like to allow time for  outcomes prior to making further decisions.  Hospital day # 6  Continue ventilatory support on tracheostomy now and wean as tolerated as per critical care team.  Speech therapy for swallow eval and may need to consider PEG tube if family wants to continue aggressive support.  Continue hypertonic saline with serum sodium goal 150-155 and will repeat CT scan of the head without contrast tomorrow.  Discussed with daughter at the bedside.  Discussed with Dr. Kendrick Fries critical care medicine This patient is critically ill and at significant risk of neurological worsening, death and care requires constant monitoring of vital signs, hemodynamics,respiratory and cardiac monitoring, extensive review of multiple databases, frequent neurological assessment, discussion with family, other specialists and medical decision making of high complexity.I have made any additions or clarifications directly to the above  note.This critical care time does not reflect procedure time, or teaching time or supervisory time of PA/NP/Med Resident etc but could involve care discussion time.  I spent 30 minutes of neurocritical care time  in the care of  this patient.       Delia Heady, MD      To contact Stroke Continuity provider, please refer to [email or web address]. After hours, contact General Neurology

## 2020-03-14 NOTE — Progress Notes (Signed)
SLP Cancellation Note  Patient Details Name: Deborah [name] MRN: [id number] DOB: [birth year]   Cancelled treatment:       Reason Eval/Treat Not Completed: Medical issues which prohibited therapy. Touched base with RN, who says that pt is working on weaning today and may be appropriate for TC trials as early as tomorrow. Will f/u to see if she is able to try PMV while on TC.   Mahala Menghini., M.A. CCC-SLP Acute Rehabilitation Services Pager [phone] Office [phone]  03/14/2020, 11:14 AM

## 2020-03-14 NOTE — Progress Notes (Addendum)
NAME:  Quintin AltoSylvia Brew, MRN:  [id number], DOB:  10/29/1970, LOS: 6 ADMISSION DATE:  03/08/2020, CONSULTATION DATE:  03/08/20 REFERRING MD:  Neurology, CHIEF COMPLAINT:  CVA    Brief History   49 y/o female with history of IDDM, HLD, tobacco use disorder presented [facility] for facial droop, right sided weakness and global aphasia. Found to have LVO of left M1 segment and was [facility] at endovascular revascularization.   History of present illness   49 y/o female presented after being found by her husband this morning with facial droop, right sided weakness, and global aphasia. LKN was 8:00 pm the night before. NIH score of 21 on arrival. She was immediately taken to IR and intubated for attempt at endovascular revascularization. Unfortunately, she had immediate reocclusion after achieving TICI 2c revascularization. Rescue stent not performed due to increased risk of ICH. She was transferred to the ICU for continued monitoring post-procedure.   Past Medical History  IDDM HLD Tobacco use Migraine headaches Chronic pain  Anxiety   Significant Hospital Events   9/21 > intubated for IR procedure 9/21> unsuccessful revascularization 9/21> admitted to neuro ICU 9/24: pt remains on vent on light sedation awake but able to utilize extremities to follow commands. Does hold eyes shut on command. Unable to perform tongue thrust. Pt and family would benefit from palliative consult, d/w Dr Roda ShuttersXu (he will speak with pt's boyfriend who son is deferring decisions to). 3% stopped overnight 2/2 Na >155. Neurosx signing off despite some progressive shift on imaging. 9/25: Planning for tracheostomy today-this is rescheduled 9/26: No overnight events  Consults:  PCCM Neurosurgery   Procedures:  9/21>cerebral arteriogram with emergent mechanical thrombectomy   Significant Diagnostic Tests:  9/21 CT head > Acute left MCA territory infarct without hemorrhagic conversion  or progression from CT earlier today. Aspects remains 6  9/22 at 1 am CT head >Continued interval evolution of large left MCA territory infarct, overall similar in size and distribution from previous. Associated regional mass effect with partial effacement of the left lateral ventricle has mildly worsened, with worsened left-to-right midline shift now measuring up to 5 mm 9/22 at 7:50 am CT head > Unchanged extent of a large acute/early subacute left MCA vascular territory infarct and associated hemorrhage as compared to the head CT performed earlier the same day at 12:57 a.m. Continued slight interval increase in mass effect with partial effacement of the left lateral ventricle and now 6 mm rightward midline shift (previously 5 mm). The basal cisterns remain patent. No evidence of ventricular entrapment. 9/22 MRI brain > Large territory infarct left MCA territory with moderate hemorrhage and mild subarachnoid hemorrhage. There is edema and mass-effect with 7 mm midline shift to the right. Left MCA is occluded in the M1 segment distal to an early temporal branch. No other intracranial stenosis 9/24 cth: Acute left MCA territory infarct with mildly progressive swelling (9 mm midline shift) and regressed hemorrhage.   Micro Data:  N/A  Antimicrobials:  N/A   Interim history/subjective:  S/p tracheostomy 9/26, tolerated well NAEO, off all sedation  AM labs are not fully resulted; Na is 153 and 3% saline is at rate of 75ml   Objective   Blood pressure (!) 142/59, pulse 69, temperature 99 F (37.2 C), temperature source Axillary, resp. rate 18, height 5' (1.524 m), weight 91.5 kg, SpO2 99 %.    Vent Mode: PRVC FiO2 (%):  [40 %] 40 % Set Rate:  [18 bmp] 18  bmp Vt Set:  [370 mL] 370 mL PEEP:  [5 cmH20] 5 cmH20 Plateau Pressure:  [15 cmH20-19 cmH20] 15 cmH20   Intake/Output Summary (Last 24 hours) at 03/14/2020 0732 Last data filed at 03/14/2020 0700 Gross per 24 hour  Intake 2077.9  ml  Output 1975 ml  Net 102.9 ml   Filed Weights   03/12/20 0500 03/13/20 0500 03/14/20 0500  Weight: 84.7 kg 91.2 kg 91.5 kg    Examination: General: Ill appearing middle aged F, trach/vent, NAD  HENT: NCAT Trach secure. Trachea midline. Anicteric sclera  Lungs: Clear breath sounds bilaterally Cardiovascular: RRR s1s2 no rgm Cap refill brisk  Abdomen: soft round ndnt + bowel sounds  Extremities: Symmetrical bulk and tone  No obvious joint deformity  Neuro: Opens eyes to stimulation does not follow commands Moving spontaneously   Resolved Hospital Problem list     Assessment & Plan:  Left MCA stroke, unsuccessful revascularization -Neuro status remains unchanged-- CT H with stable L MCA infarct + stable 49mm L to R midline shift  Cerebral edema  P -3% saline, goal Na 150-160 -CVA recs per neuro   Acute respiratory failure secondary to inadequate airway protection -s/p tracheostomy 9/26 P -routine trach care -continue efforts at weaning MV; ultimate goal ATC -VAP, pulm hygiene   Insulin-dependent diabetes with severe hyperglycemia -Continue SSI  Migraines Chronic pain and anxiety Home meds include Xanax, Fioricet, gabapentin, Flexeril, tramadol P -holding home meds     Best practice:  Diet: Tube feeds Pain/Anxiety/Delirium protocol (if indicated): off VAP protocol (if indicated): Y DVT prophylaxis: SCDs GI prophylaxis: protonix  Glucose control: SSI, Lantus Mobility: Bedrest Code Status: FULL Family Communication: per primary Disposition: ICU  Labs   CBC: Recent Labs  Lab 03/09/20 1119 03/10/20 0449 03/11/20 0437 03/12/20 0441 03/13/20 0400  WBC 18.7* 13.1* 13.7* 13.3* 14.3*  NEUTROABS 14.7* 9.1* 9.4* 9.2* 10.8*  HGB 12.1 11.4* 10.5* 10.5* 10.2*  HCT 37.3 37.0 35.1* 34.3* 32.8*  MCV 100.0 105.7* 107.3* 105.9* 102.8*  PLT 284 284 257 259 239    Basic Metabolic Panel: Recent Labs  Lab 03/08/20 1151 03/08/20 1523 03/09/20 0902 03/09/20 1119  03/10/20 0449 03/10/20 1320 03/10/20 1816 03/10/20 2330 03/11/20 0437 03/11/20 1134 03/11/20 1939 03/11/20 1939 03/12/20 0441 03/12/20 0924 03/13/20 0245 03/13/20 0400 03/13/20 1316 03/13/20 2057 03/14/20 0246  NA 135   < > 145   < > 155*   < > 160*   < > 155*   < > 153*   < > 151*  150*   < > 147* 146* 145 150* 153*  K 4.0   < > 4.7  --  4.7  --   --   --  3.7  --   --   --  3.9  --   --  4.0  --   --   --   CL 102   < > 115*  --  126*  --   --   --  126*  --   --   --  117*  --   --  112*  --   --   --   CO2 21*   < > 17*  --  22  --   --   --  22  --   --   --  24  --   --  25  --   --   --   GLUCOSE 251*   < > 201*  --  189*  --   --   --  170*  --   --   --  256*  --   --  198*  --   --   --   BUN 13   < > 10  --  9  --   --   --  12  --   --   --  16  --   --  18  --   --   --   CREATININE 1.11*   < > 1.02*  --  0.97  --   --   --  0.98  --   --   --  0.93  --   --  0.78  --   --   --   CALCIUM 8.8*   < > 8.1*  --  8.6*  --   --   --  8.7*  --   --   --  8.7*  --   --  8.8*  --   --   --   MG 2.0  --   --   --   --   --  2.4  --  2.2  --  2.0  --   --   --   --   --   --   --   --   PHOS 3.6  --   --   --   --   --  2.9  --  3.5  --  3.3  --   --   --   --   --   --   --   --    < > = values in this interval not displayed.   GFR: Estimated Creatinine Clearance: 87.7 mL/min (by C-G formula based on SCr of 0.78 mg/dL). Recent Labs  Lab 03/10/20 0449 03/11/20 0437 03/12/20 0441 03/13/20 0400  WBC 13.1* 13.7* 13.3* 14.3*    Liver Function Tests: Recent Labs  Lab 03/08/20 1151  AST 24  ALT 30  ALKPHOS 169*  BILITOT 0.4  PROT 7.1  ALBUMIN 3.4*   No results for input(s): LIPASE, AMYLASE in the last 168 hours. No results for input(s): AMMONIA in the last 168 hours.  ABG    Component Value Date/Time   PHART 7.347 (L) 03/08/2020 1544   PCO2ART 39.2 03/08/2020 1544   PO2ART 103 03/08/2020 1544   HCO3 21.6 03/08/2020 1544   TCO2 23 03/08/2020 1544   ACIDBASEDEF  4.0 (H) 03/08/2020 1544   O2SAT 98.0 03/08/2020 1544     Coagulation Profile: Recent Labs  Lab 03/08/20 1151  INR 1.1    Cardiac Enzymes: No results for input(s): CKTOTAL, CKMB, CKMBINDEX, TROPONINI in the last 168 hours.  HbA1C: Hgb A1c MFr Bld  Date/Time Value Ref Range Status  03/09/2020 11:19 AM 10.7 (H) 4.8 - 5.6 % Final    Comment:    (NOTE) Pre diabetes:          5.7%-6.4%  Diabetes:              >6.4%  Glycemic control for   <7.0% adults with diabetes     CBG: Recent Labs  Lab 03/10/20 2304 03/11/20 0325 03/11/20 0754 03/11/20 1129 03/11/20 1523  GLUCAP 205* 166* 191* 263* 237*    CRITICAL CARE Performed by: Lanier Clam   Total critical care time: 35 minutes  Critical care time was exclusive of separately billable procedures and treating other patients.  Critical care was necessary to treat or prevent imminent or life-threatening deterioration.  Critical  care was time spent personally by me on the following activities: development of treatment plan with patient and/or surrogate as well as nursing, discussions with consultants, evaluation of patient's response to treatment, examination of patient, obtaining history from patient or surrogate, ordering and performing treatments and interventions, ordering and review of laboratory studies, ordering and review of radiographic studies, pulse oximetry and re-evaluation of patient's condition.  Tessie Fass MSN, AGACNP-BC [place] [facility] [phone] If no answer, [phone] 03/14/2020, 7:32 AM   Attending:    Subjective: Tracheostomy performed yesterday In good position Awake today, opens eyes but not following commands for me  Objective: Vitals:   03/14/20 0748 03/14/20 0800 03/14/20 1125 03/14/20 1200  BP: (!) 143/78  (!) 146/58   Pulse: 77  66   Resp: (!) 22  17   Temp:  98.5 F (36.9 C)  98.6 F (37 C)  TempSrc:  Axillary  Axillary  SpO2: 99%  98%   Weight:       Height:       Vent Mode: PSV;CPAP FiO2 (%):  [40 %] 40 % Set Rate:  [18 bmp] 18 bmp Vt Set:  [370 mL] 370 mL PEEP:  [5 cmH20] 5 cmH20 Pressure Support:  [12 cmH20] 12 cmH20 Plateau Pressure:  [15 cmH20-19 cmH20] 15 cmH20  Intake/Output Summary (Last 24 hours) at 03/14/2020 1354 Last data filed at 03/14/2020 0700 Gross per 24 hour  Intake 2164.86 ml  Output 1975 ml  Net 189.86 ml    General:  In bed on vent HENT: NCAT tracheostomy in place PULM: CTA B, vent supported breathing CV: RRR, no mgr GI: BS+, soft, nontender MSK: normal bulk and tone Neuro: eyes open to voice, but doesn't follow commands    CBC    Component Value Date/Time   WBC 14.3 (H) 03/13/2020 0400   RBC 3.19 (L) 03/13/2020 0400   HGB 10.2 (L) 03/13/2020 0400   HCT 32.8 (L) 03/13/2020 0400   PLT 239 03/13/2020 0400   MCV 102.8 (H) 03/13/2020 0400   MCH 32.0 03/13/2020 0400   MCHC 31.1 03/13/2020 0400   RDW 14.2 03/13/2020 0400   LYMPHSABS 2.1 03/13/2020 0400   MONOABS 1.1 (H) 03/13/2020 0400   EOSABS 0.2 03/13/2020 0400   BASOSABS 0.1 03/13/2020 0400    BMET    Component Value Date/Time   NA 154 (H) 03/14/2020 0857   K 4.0 03/13/2020 0400   CL 112 (H) 03/13/2020 0400   CO2 25 03/13/2020 0400   GLUCOSE 198 (H) 03/13/2020 0400   BUN 18 03/13/2020 0400   CREATININE 0.78 03/13/2020 0400   CALCIUM 8.8 (L) 03/13/2020 0400   GFRNONAA >60 03/13/2020 0400   GFRAA >60 03/13/2020 0400    CXR images personally reviewed> RLL collapse, tracheostomy in place  Impression/Plan: Acute respiratory failure with hypoxemia due to inability to protect airway> tracheostomy in place, continue care per routine, attempt trach collar today R MCA stroke> PT consult, secondary stroke prevention, BP and glucose management per primary service Hypernatremia, intentional, maintain goal 150-160 for now  Rest per NP note  My cc time 30 minutes  Heber Edgewood, MD [place] [facility] Pager: [phone] Cell:  [phone] After 3pm or if no response, call [phone]

## 2020-03-15 DIAGNOSIS — I63512 Cerebral infarction due to unspecified occlusion or stenosis of left middle cerebral artery: Secondary | ICD-10-CM | POA: Diagnosis not present

## 2020-03-15 DIAGNOSIS — I63311 Cerebral infarction due to thrombosis of right middle cerebral artery: Secondary | ICD-10-CM | POA: Diagnosis not present

## 2020-03-15 LAB — CBC
HCT: 31.7 % — ABNORMAL LOW (ref 36.0–46.0)
Hemoglobin: 9.7 g/dL — ABNORMAL LOW (ref 12.0–15.0)
MCH: 31.6 pg (ref 26.0–34.0)
MCHC: 30.6 g/dL (ref 30.0–36.0)
MCV: 103.3 fL — ABNORMAL HIGH (ref 80.0–100.0)
Platelets: 282 10*3/uL (ref 150–400)
RBC: 3.07 MIL/uL — ABNORMAL LOW (ref 3.87–5.11)
RDW: 14.3 % (ref 11.5–15.5)
WBC: 14.8 10*3/uL — ABNORMAL HIGH (ref 4.0–10.5)
nRBC: 0 % (ref 0.0–0.2)

## 2020-03-15 LAB — BASIC METABOLIC PANEL
Anion gap: 9 (ref 5–15)
BUN: 20 mg/dL (ref 6–20)
CO2: 25 mmol/L (ref 22–32)
Calcium: 8.5 mg/dL — ABNORMAL LOW (ref 8.9–10.3)
Chloride: 115 mmol/L — ABNORMAL HIGH (ref 98–111)
Creatinine, Ser: 0.78 mg/dL (ref 0.44–1.00)
GFR calc Af Amer: >60 mL/min (ref >60)
GFR calc non Af Amer: >60 mL/min (ref >60)
Glucose, Bld: 143 mg/dL — ABNORMAL HIGH (ref 70–99)
Potassium: 3.1 mmol/L — ABNORMAL LOW (ref 3.5–5.1)
Sodium: 149 mmol/L — ABNORMAL HIGH (ref 135–145)

## 2020-03-15 LAB — SODIUM: Sodium: 149 mmol/L — ABNORMAL HIGH (ref 135–145)

## 2020-03-15 LAB — GLUCOSE, CAPILLARY
Glucose-Capillary: 161 mg/dL — ABNORMAL HIGH (ref 70–99)
Glucose-Capillary: 183 mg/dL — ABNORMAL HIGH (ref 70–99)
Glucose-Capillary: 172 mg/dL — ABNORMAL HIGH (ref 70–99)
Glucose-Capillary: 125 mg/dL — ABNORMAL HIGH (ref 70–99)
Glucose-Capillary: 149 mg/dL — ABNORMAL HIGH (ref 70–99)

## 2020-03-15 MED ORDER — POTASSIUM CHLORIDE 20 MEQ/15ML (10%) PO SOLN
40.0000 meq | Freq: Two times a day (BID) | ORAL | Status: AC
  Administered 2020-03-15 (×2): 40 meq
  Filled 2020-03-15 (×2): qty 30

## 2020-03-15 NOTE — Evaluation (Signed)
Passy-Muir Speaking Valve - Evaluation Patient Details  Name: Deborah [name] MRN: [id number] Date of Birth: 10-24-1970  Today's Date: 03/15/2020 Time: 9798-9211 SLP Time Calculation (min) (ACUTE ONLY): 21 min  Past Medical History:  Past Medical History:  Diagnosis Date  . DM (diabetes mellitus), type 2 (HCC)   . Hyperlipidemia   . Hypersomnia   . Sprain of right ankle, initial encounter   . Syncope    Past Surgical History: The histories are not reviewed yet. Please review them in the "History" navigator section and refresh this SmartLink. HPI:  49 y/o female with history of IDDM, HLD, tobacco use disorder presented to [facility] for facial droop, right sided weakness and global aphasia. Found to have LVO of left M1 segment and was transferred to [facility]. Pt underwent left common carotid arteriogram followed by revascularization of L MCA, however it reoccluded due to sever distal M1 stenosis.Pt underwent trach on 9/26.   Assessment / Plan / Recommendation Clinical Impression  Pt tolerated cuff deflation well, but had no evidence of upper airway patency with placement of PMV. Back pressure and increased WOB were noted almost as soon as the valve was placed. Would place PMV only during SLP sessions for now. Suspect that she will need at least a cuffless trach, if not a smaller one, before she can tolerate more use.   SLP Visit Diagnosis: Aphonia (R49.1)    SLP Assessment  Patient needs continued Speech Lanaguage Pathology Services    Follow Up Recommendations  Skilled Nursing facility    Frequency and Duration min 2x/week  2 weeks    PMSV Trial PMSV was placed for: few respiratory cycles Able to redirect subglottic air through upper airway: No Able to Attain Phonation: No attempt to phonate Able to Expectorate Secretions: No attempts Level of Secretion Expectoration with PMSV: Not observed Breath Support for Phonation: Inadequate Behavior: Alert   Tracheostomy Tube        Vent Dependency  FiO2 (%): 40 %    Cuff Deflation Trial  GO Tolerated Cuff Deflation: Yes Length of Time for Cuff Deflation Trial: 20 min Behavior: Alert        Mahala Menghini., M.A. CCC-SLP Acute Rehabilitation Services Pager [phone] Office [phone]  03/15/2020, 4:45 PM

## 2020-03-15 NOTE — Plan of Care (Signed)
Family notified of patients current condition. Patient continues to remain free of infection. Patient also continuing to improve respiratory status and advanced to [facility]. Purposeful movement on right side increasing strength. Will continue to monitor.

## 2020-03-15 NOTE — Progress Notes (Signed)
Physical Therapy Treatment Patient Details Name: Deborah [name] MRN: [id number] DOB: April 23, 1971 Today's Date: 03/15/2020    History of Present Illness 49 y/o female with history of IDDM, HLD, tobacco use disorder presented to [facility] for facial droop, right sided weakness and global aphasia. Found to have LVO of left M1 segment and was transferred to [facility]. Pt underwent left common carotid arteriogram followed by revascularization of L MCA, however it reoccluded due to sever distal M1 stenosis.Pt underwent trach on 9/26.    PT Comments    Pt remains to have flat affect and lethargy however pt did maintain eyes open throughout therapy session. Pt following simple commands about a third of the time. Pt with impaired processing and sequencing requiring max multimodal cues and totalAX2 for EOB transfer. Pt with no active movement of R UE or LE with noted R UE flexor tone. Pt tolerated EOB x 10 min with occasional ability to maintain EOB balance with close min guard. Worked on truncal rotation and holding head up with sitting. Continue to recommend SNF Upon d/c for maximal functional recovery. Acute PT to cont to follow.   Follow Up Recommendations  SNF     Equipment Recommendations  Wheelchair (measurements PT);Wheelchair cushion (measurements PT);Hospital bed (TBD at next venue)    Recommendations for Other Services       Precautions / Restrictions Precautions Precautions: Fall Precaution Comments: NG, flexi  Restrictions Weight Bearing Restrictions: No    Mobility  Bed Mobility Overal bed mobility: Needs Assistance Bed Mobility: Supine to Sit;Sit to Supine     Supine to sit: Total assist;+2 for physical assistance;+2 for safety/equipment Sit to supine: Total assist;+2 for physical assistance;+2 for safety/equipment   General bed mobility comments: cues provided but pt with poor command follow today, requiring assist for LEs, trunk elevation and to scoot hips towards EOB    Transfers                 General transfer comment: NT  Ambulation/Gait             General Gait Details: not appropriate at this time   Stairs             Wheelchair Mobility    Modified Rankin (Stroke Patients Only) Modified Rankin (Stroke Patients Only) Pre-Morbid Rankin Score: No symptoms Modified Rankin: Severe disability     Balance Overall balance assessment: Needs assistance Sitting-balance support: Single extremity supported;Feet unsupported Sitting balance-Leahy Scale: Poor Sitting balance - Comments: overall requiring max-totalA for sitting balance, pt intermittently with posterior push; she is briefly able to maintain static balance using RUE support and very close minguard assist; delayed righting reaction noted Postural control: Posterior lean                                  Cognition Arousal/Alertness: Awake/alert Behavior During Therapy: Flat affect Overall Cognitive Status: Difficult to assess                                 General Comments: follows approx 1/3 of basic commands, easily distractible and only able to maintain focus on therapist for 3-5 seconds.       Exercises General Exercises - Lower Extremity Long Arc Quad: Left;Seated;10 reps (max multimodal cues )    General Comments General comments (skin integrity, edema, etc.): VSS, no skin tears noted  Pertinent Vitals/Pain Pain Assessment: Faces Faces Pain Scale: Hurts little more Pain Location: generalized response to noxious stimuli Pain Descriptors / Indicators: Grimacing Pain Intervention(s): Monitored during session    Home Living                      Prior Function            PT Goals (current goals can now be found in the care plan section) Acute Rehab PT Goals Patient Stated Goal: pt unable to state Progress towards PT goals: Progressing toward goals    Frequency    Min 3X/week      PT Plan Current  plan remains appropriate    Co-evaluation PT/OT/SLP Co-Evaluation/Treatment: Yes Reason for Co-Treatment: Complexity of the patient's impairments (multi-system involvement) PT goals addressed during session: Mobility/safety with mobility OT goals addressed during session: ADL's and self-care      AM-PAC PT "6 Clicks" Mobility   Outcome Measure  Help needed turning from your back to your side while in a flat bed without using bedrails?: Total Help needed moving from lying on your back to sitting on the side of a flat bed without using bedrails?: Total Help needed moving to and from a bed to a chair (including a wheelchair)?: Total Help needed standing up from a chair using your arms (e.g., wheelchair or bedside chair)?: Total Help needed to walk in hospital room?: Total Help needed climbing 3-5 steps with a railing? : Total 6 Click Score: 6    End of Session Equipment Utilized During Treatment: Oxygen (on vent via trach) Activity Tolerance: Patient tolerated treatment well Patient left: in bed;with call bell/phone within reach;with bed alarm set Nurse Communication: Mobility status;Need for lift equipment (maxi sky to transfer OOB) PT Visit Diagnosis: Other abnormalities of gait and mobility (R26.89);Muscle weakness (generalized) (M62.81);Other symptoms and signs involving the nervous system (R29.898);Hemiplegia and hemiparesis Hemiplegia - Right/Left: Right Hemiplegia - dominant/non-dominant:  (unable to determine) Hemiplegia - caused by: Cerebral infarction     Time: 5956-3875 PT Time Calculation (min) (ACUTE ONLY): 27 min  Charges:  $Neuromuscular Re-education: 8-22 mins                     Lewis Shock, PT, DPT Acute Rehabilitation Services Pager #: [phone] Office #: [phone]    Deborah [name] Deborah [name] 03/15/2020, 10:14 AM

## 2020-03-15 NOTE — Progress Notes (Signed)
Occupational Therapy Treatment Patient Details Name: Deborah [name] MRN: [id number] DOB: 06/28/1970 Today's Date: 03/15/2020    History of present illness 49 y/o female with history of IDDM, HLD, tobacco use disorder presented to [facility] for facial droop, right sided weakness and global aphasia. Found to have LVO of left M1 segment and was transferred to [facility]. Pt underwent left common carotid arteriogram followed by revascularization of L MCA, however it reoccluded due to sever distal M1 stenosis.   OT comments  Pt awake/alert during this session, remains with flat affect and reduced command follow (following approx 1/3 of basic commands given multimodal cues). Pt tolerating sitting EOB with fluctuating levels of assist (up to totalA) but is briefly able to sustain static balance with close minguard assist using RUE support. Pt requiring two person assist for completion of bed mobility and max hand over hand assist to attempt simple grooming task while EOB. VSS throughout on trach/vent support. Pt to benefit from continued acute OT services and feel SNF recommendation remains most appropriate recommendation at this time. Will follow.    Follow Up Recommendations  SNF    Equipment Recommendations  Other (comment) (defer to next venue of care)          Precautions / Restrictions Precautions Precautions: Fall Precaution Comments: NG, flexi  Restrictions Weight Bearing Restrictions: No       Mobility Bed Mobility Overal bed mobility: Needs Assistance Bed Mobility: Supine to Sit;Sit to Supine     Supine to sit: Total assist;+2 for physical assistance;+2 for safety/equipment Sit to supine: Total assist;+2 for physical assistance;+2 for safety/equipment   General bed mobility comments: cues provided but pt with poor command follow today, requiring assist for LEs, trunk elevation and to scoot hips towards EOB   Transfers                 General transfer comment:  NT    Balance Overall balance assessment: Needs assistance Sitting-balance support: Single extremity supported;Feet unsupported Sitting balance-Leahy Scale: Poor Sitting balance - Comments: overall requiring max-totalA for sitting balance, pt intermittently with posterior push; she is briefly able to maintain static balance using RUE support and very close minguard assist; delayed righting reaction noted Postural control: Posterior lean                                 ADL either performed or assessed with clinical judgement   ADL Overall ADL's : Needs assistance/impaired     Grooming: Wash/dry face;Maximal assistance;Sitting Grooming Details (indicate cue type and reason): max hand over hand assist to perform, minimal carryover of pt attempting to perform on her own                              Functional mobility during ADLs: Total assistance;+2 for physical assistance;+2 for safety/equipment (for bed mobility)       Vision   Vision Assessment?: Vision impaired- to be further tested in functional context Additional Comments: pt will briefly track to therapist but only able to maintain focus grossly for 3-5 seconds at at time    Perception     Praxis      Cognition Arousal/Alertness: Awake/alert Behavior During Therapy: Flat affect Overall Cognitive Status: Difficult to assess  General Comments: follows approx 1/3 of basic commands, easily distractible and only able to maintain focus on therapist for 3-5 seconds.         Exercises Exercises: General Lower Extremity General Exercises - Lower Extremity Long Arc Quad: Left;5 reps;Seated (max multimodal cues )   Shoulder Instructions       General Comments      Pertinent Vitals/ Pain       Pain Assessment: Faces Faces Pain Scale: Hurts little more Pain Location: generalized Pain Descriptors / Indicators: Grimacing Pain Intervention(s):  Monitored during session  Home Living                                          Prior Functioning/Environment              Frequency  Min 2X/week        Progress Toward Goals  OT Goals(current goals can now be found in the care plan section)  Progress towards OT goals: Progressing toward goals  Acute Rehab OT Goals Patient Stated Goal: pt unable to state ADL Goals Pt Will Perform Grooming: with mod assist;sitting Pt Will Transfer to Toilet: with max assist;stand pivot transfer;bedside commode Pt/caregiver will Perform Home Exercise Program: Increased ROM;Increased strength;Right Upper extremity;Left upper extremity Additional ADL Goal #1: Pt will tolerate sitting EOB for 10 min as preparation for ADL  Plan Discharge plan remains appropriate    Co-evaluation    PT/OT/SLP Co-Evaluation/Treatment: Yes Reason for Co-Treatment: Complexity of the patient's impairments (multi-system involvement);To address functional/ADL transfers;Necessary to address cognition/behavior during functional activity;For patient/therapist safety   OT goals addressed during session: ADL's and self-care      AM-PAC OT "6 Clicks" Daily Activity     Outcome Measure   Help from another person eating meals?: Total Help from another person taking care of personal grooming?: Total Help from another person toileting, which includes using toliet, bedpan, or urinal?: Total Help from another person bathing (including washing, rinsing, drying)?: Total Help from another person to put on and taking off regular upper body clothing?: Total Help from another person to put on and taking off regular lower body clothing?: Total 6 Click Score: 6    End of Session Equipment Utilized During Treatment: Oxygen (trach/vent )  OT Visit Diagnosis: Unsteadiness on feet (R26.81);Other abnormalities of gait and mobility (R26.89);Muscle weakness (generalized) (M62.81);Other symptoms and signs involving  the nervous system (R29.898);Other symptoms and signs involving cognitive function;Hemiplegia and hemiparesis Hemiplegia - Right/Left: Right Hemiplegia - dominant/non-dominant: Dominant   Activity Tolerance Patient tolerated treatment well   Patient Left in bed;with call bell/phone within reach;with SCD's reapplied   Nurse Communication Mobility status        Time: 1093-2355 OT Time Calculation (min): 27 min  Charges: OT General Charges $OT Visit: 1 Visit OT Treatments $Self Care/Home Management : 8-22 mins  Marcy Siren, OT Acute Rehabilitation Services Pager [phone] Office [phone]   Orlando Penner 03/15/2020, 10:00 AM

## 2020-03-15 NOTE — Progress Notes (Signed)
STROKE TEAM PROGRESS NOTE   INTERVAL HISTORY Her nurses are at the bedside. She is mimicking on the L side.  She has been placed on trach collar since this a.m. and so far seems to be tolerating it well.  She remains on ventilatory support for respiratory failure.  Her sister arrived after rounds.  Vital signs stable.  Neuro exam unchanged.  She remains aphasic Vitals:   03/15/20 0700 03/15/20 0743 03/15/20 0800 03/15/20 0900  BP: 136/67  (!) 129/56 (!) 137/56  Pulse: 61  63 (!) 56  Resp: (!) 21  18 15   Temp:   98.9 F (37.2 C)   TempSrc:   Axillary   SpO2: 90% 94% 99% 99%  Weight:      Height:       CBC:  Recent Labs  Lab 03/12/20 0441 03/12/20 0441 03/13/20 0400 03/15/20 0900  WBC 13.3*   < > 14.3* 14.8*  NEUTROABS 9.2*  --  10.8*  --   HGB 10.5*   < > 10.2* 9.7*  HCT 34.3*   < > 32.8* 31.7*  MCV 105.9*   < > 102.8* 103.3*  PLT 259   < > 239 282   < > = values in this interval not displayed.   Basic Metabolic Panel:  Recent Labs  Lab 03/11/20 0437 03/11/20 0437 03/11/20 1134 03/11/20 1939 03/12/20 0441 03/12/20 0924 03/13/20 0400 03/13/20 1316 03/14/20 2104 03/15/20 0257  NA 155*   < >   < > 153* 151*  150*   < > 146*   < > 151* 149*  K 3.7   < >  --   --  3.9  --  4.0  --   --   --   CL 126*   < >  --   --  117*  --  112*  --   --   --   CO2 22   < >  --   --  24  --  25  --   --   --   GLUCOSE 170*   < >  --   --  256*  --  198*  --   --   --   BUN 12   < >  --   --  16  --  18  --   --   --   CREATININE 0.98   < >  --   --  0.93  --  0.78  --   --   --   CALCIUM 8.7*   < >  --   --  8.7*  --  8.8*  --   --   --   MG 2.2  --   --  2.0  --   --   --   --   --   --   PHOS 3.5  --   --  3.3  --   --   --   --   --   --    < > = values in this interval not displayed.   IMAGING past 24 hours No results found.   PHYSICAL EXAM     General -mildly obese middle-aged Caucasian female, s/p tracheostomy.  Ophthalmologic - fundi not visualized due to  noncooperation.  Cardiovascular - Regular rate and rhythm.  Neuro -s/p tracheostomy on sedation, eyes open on voice, seem to able to follow eye opening and closure, still not following peripheral commands. With eye opening, eyes in left  gaze preference but able to cross midline briefly, right gaze incomplete, not blinking to visual threat on the right, doll's eyes present, tracking on the left not on the right. Pupil right 2mm and left 2.5mm, under dark more prominent, consistent with left horner syndrome. Corneal reflex present, gag and cough present. Breathing over the vent.  Facial symmetry not able to test due to ET tube.  Tongue protrusion not cooperative. Able to hold LUE against gravity without drift, LLE withdraw to pain and able to hold knee flexion and foot in bed position without drift. RUE flaccid and RLE mild withdraw to pain but increased muscle tone, not able to hold on knee flexion and foot in bed position. DTR 1+ and no babinski. Sensation, coordination and gait not tested.   ASSESSMENT/PLAN Ms. Dima Mini is a 49 y.o. female with history of difficulty to control DB, HLD, syncope presenting to [facility] with R hemiparesis, facial droop, left gaze, and global aphasia w/ glucose 400s where she was found to have L M1 occlusion and transferred to [facility] for IR.   Stroke:   L MCA infarct s/p IR w/ post procedure hemorrhage, infarct secondary to large vessel disease source  CT head [facility]) L MCA infarct w/ ASPECTS 6  CTA head & neck [facility]) L M2 cutoff  CT head L MCA territory infarct. ASPECTS 6.   CT perfusion 63 core L brain same as CT. 32 penumbra. 1.5 mismatch ratio.  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 Post IR hemorrhage.  CT head 9/21 1818 increased L MCA ischemic infarct w/ mass effect and partial effacement L lateral ventrilc w/ 54mm  midline shift. Small foci L BG HT same w/ increased L perisylvian and L cerebral convexity hemorrhage.   CT head 9/22 0151 evolution large L MCA infarct w/ slightly worse effacement and mass effect w/ 68mm midline shift. Scattered foci hemorrhage in infarct same w/ contrast clearing. L M1 and proximal L MCA branches w/ persistent hyperdensity.   CT head 9/22 0827  L MCA infarct and hemorrhages same. Increase in mass effect and effacement now 56mm midline shift. L M1 and proximal L MCA braches w/ persistent hyperdensity.   MRI 9/22 Large L MCA infarct w/ moderate infarct and SAH. Edema, 35mm midline shift.   MRA 9/22 L MCA 1 occlusion   CT head 9/24 L MCA infarct w/ mild increase in edema, 16mm midline shift    CT repeat 9/26 - Stable extent of left MCA territory infarct with small volume blood products. Midline shift continues to measure 9 mm.  2D Echo EF 60-65%. No source of embolus   Recent Zio patch Normal w/ infrequent PACs, PVCs 01/07/2020  LDL 174   HgbA1c 10.7   VTE prophylaxis - Lovenox 40 mg sq daily    clopidogrel 75 mg daily prior to admission, now on No antithrombotic given post IR hemorrhage. Repeat CT in am, if hemorrhage stable, will consider ASA 81.   Therapy recommendations:  Skilled Nursing Facility   Disposition:  pending   Acute Respiratory Failure  Secondary to stroke  Intubated for IR, left intubated post IR    Off sedation  CCM on board   Trach placed 03/13/20  Weaning underway     Cerebral Edema  MRI 9/22 with MLS @ 73mm  CT head 9/24 L MCA infarct w/ mild increase in edema, 39mm midline shift  CT repeat 9/26 - Stable extent of left MCA territory infarct with small volume blood products. Midline shift continues to measure 9 mm.  On 3% protocol - @75 ->50->NS  23.4% x 1 (9/25)  Goal Na 150-160  Na 154->153->151->149->147->146->156->151->14->149  NS onboard 09-29-1970) - no hemicrani needed at this time    HOB > 30  Has PICC  Fever  LUE  Shivering  Tmax 99.4  Leukocytosis WBC 24.2->18.7->13.1->13.7->13.3->14.3->14.8   EEG L slowing, no sz  UA -> not c/w UTI  CXR - 03/11/20 - no evidence of active pulmonary disease  R Groin Firmness, improved  R distal pulse 1+  Groin Doppler R common femoral patent, no pseudoaneurysm, AVF or DVT.   Continue monitoring  Blood Pressure  Home meds:  None, no hx HTN  Stable . BP goal < 160 for now due to hemorrhagic transformation  . Off Cleviprex gtt  . Long-term BP goal normotensive  Hyperlipidemia  Home meds:  lipitor 20  LDL 174, goal < 70  On lipitor 80  Continue statin at discharge  Diabetes type II Uncontrolled  Home meds:  Glimepiride , lantus 21, liraglutide 1.8  HgbA1c 10.7, goal < 7.0  CBGs  Hyperglycemia improved  SSI 0-15  On lantus 28  DM coordinator on board  Dysphagia . Secondary to stroke . NPO except meds . on TF @ 47 via cortrak  . On IVF @ 52 . Speech on board  Tobacco abuse  Current smoker  Will provide smoking cessation when able    Urinary retention  Off Foley catheter after procedure  Needed I&O x4-5  Put back Foley catheter 9/25  On Urecholine  Other Stroke Risk Factors  UDS positive benzos and barb (done following IR procedure)   Obesity, Body mass index is 39.18 kg/m., recommend weight loss, diet and exercise as appropriate   Family hx stroke (paternal grandfather)  Migraines on fioricet  Other Active Problems  Chronic pain, Anxiety  Acute blood loss anemia s/p IR, Hgb 14.3->12.1->11.4->10.5->10.5->10.2->9.7   Palliative Care consult 03/12/20 - per pt's son - patient has stated she would not want life prolonged in vegetative state or with significant loss of function - would like to allow time for outcomes prior to making further decisions.  Hypokalemia 3.1 - supplemented  Hospital day # 7 Continue ventilatory support and try to wean to trach collar as tolerated as per CCM.  Likely resume full  vent support overnight today.  Long discussion with patient and family members at the bedside and answered questions.  Continue ongoing treatment.  Discussed with Dr. 03/14/20 critical care medicine This patient is critically ill and at significant risk of neurological worsening, death and care requires constant monitoring of vital signs, hemodynamics,respiratory and cardiac monitoring, extensive review of multiple databases, frequent neurological assessment, discussion with family, other specialists and medical decision making of high complexity.I have made any additions or clarifications directly to the above note.This critical care time does not reflect procedure time, or teaching time or supervisory time of PA/NP/Med Resident etc but could involve care discussion time.  I spent 30 minutes of neurocritical care time  in the care of  this patient.    Kendrick Fries, MD     To contact Stroke Continuity provider, please refer to Delia Heady. After hours, contact General Neurology

## 2020-03-15 NOTE — Evaluation (Signed)
Speech Language Pathology Evaluation Patient Details Name: Deborah [name] MRN: [id number] DOB: Jun 03, 1971 Today's Date: 03/15/2020 Time: 5027-7412 SLP Time Calculation (min) (ACUTE ONLY): 21 min  Problem List:  Patient Active Problem List   Diagnosis Date Noted  . Cerebrovascular accident (CVA) due to thrombosis of middle cerebral artery (HCC)   . Palliative care by specialist   . Goals of care, counseling/discussion   . Acute ischemic left MCA stroke (HCC) 03/08/2020  . Middle cerebral artery embolism, left 03/08/2020  . Hypertension   . Dyslipidemia 12/10/2019  . Syncope and collapse 10/29/2019  . Atypical chest pain 10/29/2019  . Smoking 10/29/2019  . Anxiety 10/28/2019  . Diabetes mellitus (HCC) 10/28/2019  . Migraines 10/28/2019  . Rhinitis, allergic 10/28/2019  . Shortness of breath 10/28/2019  . Chronic pyelonephritis 09/03/2016  . Atrophic kidney 07/19/2016  . Unspecified abdominal pain 07/19/2016  . PONV (postoperative nausea and vomiting) 06/18/2005   Past Medical History:  Past Medical History:  Diagnosis Date  . DM (diabetes mellitus), type 2 (HCC)   . Hyperlipidemia   . Hypersomnia   . Sprain of right ankle, initial encounter   . Syncope    Past Surgical History:  The histories are not reviewed yet. Please review them in the "History" navigator section and refresh this SmartLink. HPI:  49 y/o female with history of IDDM, HLD, tobacco use disorder presented to [facility] for facial droop, right sided weakness and global aphasia. Found to have LVO of left M1 segment and was transferred to [facility]. Pt underwent left common carotid arteriogram followed by revascularization of L MCA, however it reoccluded due to sever distal M1 stenosis.Pt underwent trach on 9/26.   Assessment / Plan / Recommendation Clinical Impression  Pt has a significant expressive and receptive aphasia with suspected motor planning difficulties as well. She followed a few one-step  commands, closing her eyes to command consistently and with other commands needing some tactile cues for initiation. She does not respond to yes/no questions or identify common objects from a field of two. Although pt is not able to wear PMV at this time, attempting repetition of sounds with pt approximating her lips to the sound /a/ but then perseverating on this regardless of other stimulus provided. Pt will need ongoing SLP f/u to facilitate functional communication.     SLP Assessment  SLP Recommendation/Assessment: Patient needs continued Speech Lanaguage Pathology Services SLP Visit Diagnosis: Aphasia (R47.01)    Follow Up Recommendations  Skilled Nursing facility    Frequency and Duration min 2x/week  2 weeks      SLP Evaluation Cognition  Overall Cognitive Status: Difficult to assess Arousal/Alertness: Awake/alert Orientation Level: Intubated/Tracheostomy - Unable to assess Attention: Focused Focused Attention: Appears intact       Comprehension  Auditory Comprehension Overall Auditory Comprehension: Impaired Yes/No Questions: Impaired Basic Biographical Questions: 0-25% accurate Basic Immediate Environment Questions: 0-24% accurate Commands: Impaired One Step Basic Commands: 25-49% accurate Interfering Components: Motor planning Visual Recognition/Discrimination Discrimination: Exceptions to [facility] Common Objects: Unable to indentify Reading Comprehension Reading Status: Not tested    Expression Expression Primary Mode of Expression: Verbal Verbal Expression Overall Verbal Expression: Impaired Initiation: Impaired Automatic Speech:  (none) Level of Generative/Spontaneous Verbalization:  (none) Repetition: Impaired Level of Impairment:  (sound - appeared to approximate /a/) Common Objects: Unable to indentify Non-Verbal Means of Communication:  (none) Written Expression Written Expression: Not tested   Oral / Motor  Motor Speech Overall Motor Speech: Other  (comment) (UTA)   GO  Mahala Menghini., M.A. CCC-SLP Acute Rehabilitation Services Pager [phone] Office [phone]  03/15/2020, 4:51 PM

## 2020-03-15 NOTE — Progress Notes (Addendum)
NAME:  Deborah [name], MRN:  [id number], DOB:  1970-07-09, LOS: 7 ADMISSION DATE:  03/08/2020, CONSULTATION DATE:  03/08/20 REFERRING MD:  Neurology, CHIEF COMPLAINT:  CVA    Brief History   49 y/o female with history of IDDM, HLD, tobacco use disorder presented to [facility] for facial droop, right sided weakness and global aphasia. Found to have LVO of left M1 segment and was transferred to [facility] for attempt at endovascular revascularization.   History of present illness   49 y/o female presented after being found by her husband this morning with facial droop, right sided weakness, and global aphasia. LKN was 8:00 pm the night before. NIH score of 21 on arrival. She was immediately taken to IR and intubated for attempt at endovascular revascularization. Unfortunately, she had immediate reocclusion after achieving TICI 2c revascularization. Rescue stent not performed due to increased risk of ICH. She was transferred to the ICU for continued monitoring post-procedure.   Past Medical History  IDDM HLD Tobacco use Migraine headaches Chronic pain  Anxiety   Significant Hospital Events   9/21 > intubated for IR procedure 9/21> unsuccessful revascularization 9/21> admitted to neuro ICU 9/24: pt remains on vent on light sedation awake but able to utilize extremities to follow commands. Does hold eyes shut on command. Unable to perform tongue thrust. Pt and family would benefit from palliative consult, d/w Dr Roda Shutters (he will speak with pt's boyfriend who son is deferring decisions to). 3% stopped overnight 2/2 Na >155. Neurosx signing off despite some progressive shift on imaging. 9/25: Planning for tracheostomy today-this is rescheduled 9/26: No overnight events  Consults:  PCCM Neurosurgery   Procedures:  9/21>cerebral arteriogram with emergent mechanical thrombectomy   Significant Diagnostic Tests:  9/21 CT head > Acute left MCA territory infarct without hemorrhagic conversion  or progression from CT earlier today. Aspects remains 6  9/22 at 1 am CT head >Continued interval evolution of large left MCA territory infarct, overall similar in size and distribution from previous. Associated regional mass effect with partial effacement of the left lateral ventricle has mildly worsened, with worsened left-to-right midline shift now measuring up to 5 mm 9/22 at 7:50 am CT head > Unchanged extent of a large acute/early subacute left MCA vascular territory infarct and associated hemorrhage as compared to the head CT performed earlier the same day at 12:57 a.m. Continued slight interval increase in mass effect with partial effacement of the left lateral ventricle and now 6 mm rightward midline shift (previously 5 mm). The basal cisterns remain patent. No evidence of ventricular entrapment. 9/22 MRI brain > Large territory infarct left MCA territory with moderate hemorrhage and mild subarachnoid hemorrhage. There is edema and mass-effect with 7 mm midline shift to the right. Left MCA is occluded in the M1 segment distal to an early temporal branch. No other intracranial stenosis 9/24 cth: Acute left MCA territory infarct with mildly progressive swelling (9 mm midline shift) and regressed hemorrhage.   Micro Data:  N/A  Antimicrobials:  N/A   Interim history/subjective:  Yesterday 3% decreased from 9ml/hr to 26ml/hr   NAEO   AM labs have not resulted at time of exam, except for Na which has down trended 156>151>149  Objective   Blood pressure 136/67, pulse 61, temperature 98.6 F (37 C), temperature source Axillary, resp. rate (!) 21, height 5' (1.524 m), weight 91 kg, SpO2 90 %.    Vent Mode: PRVC FiO2 (%):  [40 %] 40 % Set Rate:  [18 bmp]  18 bmp Vt Set:  [370 mL] 370 mL PEEP:  [5 cmH20] 5 cmH20 Pressure Support:  [12 cmH20] 12 cmH20   Intake/Output Summary (Last 24 hours) at 03/15/2020 0724 Last data filed at 03/15/2020 0600 Gross per 24 hour  Intake 1570.65  ml  Output 2750 ml  Net -1179.35 ml   Filed Weights   03/13/20 0500 03/14/20 0500 03/15/20 0500  Weight: 91.2 kg 91.5 kg 91 kg    Examination:  General: Ill appearing middle aged F, trach/vent NAD  HENT: NCAT trach secure with minimal secretions. Anicteric sclera Lungs: CTAB, no accessory use on PSV/CPAP  Cardiovascular: rrr s1s2 no rgm cap refill brisk  Abdomen: soft round ndnt  Extremities: Dependent non-pitting edema. No joint deformity. No cyanosis or clubbing  Neuro: Opens eyes. Does not track. Does not follow commands   Resolved Hospital Problem list     Assessment & Plan:   Left MCA stroke, unsuccessful revascularization -Neuro status remains unchanged-- CT H with stable L MCA infarct + stable 9mm L to R midline shift  Cerebral edema  -3% decr from 6075ml/hr to 2850ml/hr. Na 149 P -per neuro -3% saline, goal Na 150-160  Acute respiratory failure secondary to inadequate airway protection -s/p tracheostomy 9/26 P -routine trach care -continue efforts at weaning MV; goal ATC -VAP, pulm hygiene    Insulin-dependent diabetes with severe hyperglycemia -Continue SSI  Migraines Chronic pain and anxiety Home meds include Xanax, Fioricet, gabapentin, Flexeril, tramadol P -holding home meds   Hypokalemia -replace, trend BMP  Best practice:  Diet: Tube feeds Pain/Anxiety/Delirium protocol (if indicated): off VAP protocol (if indicated): Y DVT prophylaxis: SCDs GI prophylaxis: protonix  Glucose control: SSI, Lantus Mobility: Bedrest Code Status: FULL Family Communication: per primary Disposition: ICU  Labs   CBC: Recent Labs  Lab 03/09/20 1119 03/10/20 0449 03/11/20 0437 03/12/20 0441 03/13/20 0400  WBC 18.7* 13.1* 13.7* 13.3* 14.3*  NEUTROABS 14.7* 9.1* 9.4* 9.2* 10.8*  HGB 12.1 11.4* 10.5* 10.5* 10.2*  HCT 37.3 37.0 35.1* 34.3* 32.8*  MCV 100.0 105.7* 107.3* 105.9* 102.8*  PLT 284 284 257 259 239    Basic Metabolic Panel: Recent Labs  Lab  03/08/20 1151 03/08/20 1523 03/09/20 0902 03/09/20 1119 03/10/20 0449 03/10/20 1320 03/10/20 1816 03/10/20 2330 03/11/20 0437 03/11/20 1134 03/11/20 1939 03/11/20 1939 03/12/20 0441 03/12/20 0924 03/13/20 0400 03/13/20 1316 03/14/20 0246 03/14/20 0857 03/14/20 1512 03/14/20 2104 03/15/20 0257  NA 135   < > 145   < > 155*   < > 160*   < > 155*   < > 153*   < > 151*  150*   < > 146*   < > 153* 154* 156* 151* 149*  K 4.0   < > 4.7  --  4.7  --   --   --  3.7  --   --   --  3.9  --  4.0  --   --   --   --   --   --   CL 102   < > 115*  --  126*  --   --   --  126*  --   --   --  117*  --  112*  --   --   --   --   --   --   CO2 21*   < > 17*  --  22  --   --   --  22  --   --   --  24  --  25  --   --   --   --   --   --   GLUCOSE 251*   < > 201*  --  189*  --   --   --  170*  --   --   --  256*  --  198*  --   --   --   --   --   --   BUN 13   < > 10  --  9  --   --   --  12  --   --   --  16  --  18  --   --   --   --   --   --   CREATININE 1.11*   < > 1.02*  --  0.97  --   --   --  0.98  --   --   --  0.93  --  0.78  --   --   --   --   --   --   CALCIUM 8.8*   < > 8.1*  --  8.6*  --   --   --  8.7*  --   --   --  8.7*  --  8.8*  --   --   --   --   --   --   MG 2.0  --   --   --   --   --  2.4  --  2.2  --  2.0  --   --   --   --   --   --   --   --   --   --   PHOS 3.6  --   --   --   --   --  2.9  --  3.5  --  3.3  --   --   --   --   --   --   --   --   --   --    < > = values in this interval not displayed.   GFR: Estimated Creatinine Clearance: 87.4 mL/min (by C-G formula based on SCr of 0.78 mg/dL). Recent Labs  Lab 03/10/20 0449 03/11/20 0437 03/12/20 0441 03/13/20 0400  WBC 13.1* 13.7* 13.3* 14.3*    Liver Function Tests: Recent Labs  Lab 03/08/20 1151  AST 24  ALT 30  ALKPHOS 169*  BILITOT 0.4  PROT 7.1  ALBUMIN 3.4*   No results for input(s): LIPASE, AMYLASE in the last 168 hours. No results for input(s): AMMONIA in the last 168 hours.  ABG     Component Value Date/Time   PHART 7.347 (L) 03/08/2020 1544   PCO2ART 39.2 03/08/2020 1544   PO2ART 103 03/08/2020 1544   HCO3 21.6 03/08/2020 1544   TCO2 23 03/08/2020 1544   ACIDBASEDEF 4.0 (H) 03/08/2020 1544   O2SAT 98.0 03/08/2020 1544     Coagulation Profile: Recent Labs  Lab 03/08/20 1151  INR 1.1    Cardiac Enzymes: No results for input(s): CKTOTAL, CKMB, CKMBINDEX, TROPONINI in the last 168 hours.  HbA1C: Hgb A1c MFr Bld  Date/Time Value Ref Range Status  03/09/2020 11:19 AM 10.7 (H) 4.8 - 5.6 % Final    Comment:    (NOTE) Pre diabetes:          5.7%-6.4%  Diabetes:              >6.4%  Glycemic control  for   <7.0% adults with diabetes     CBG: Recent Labs  Lab 03/13/20 2313 03/14/20 0301 03/14/20 0727 03/14/20 1110 03/14/20 1506  GLUCAP 181* 197* 163* 121* 143*    CRITICAL CARE Performed by: Lanier Clam   Total critical care time: 35 minutes  Critical care time was exclusive of separately billable procedures and treating other patients. Critical care was necessary to treat or prevent imminent or life-threatening deterioration.  Critical care was time spent personally by me on the following activities: development of treatment plan with patient and/or surrogate as well as nursing, discussions with consultants, evaluation of patient's response to treatment, examination of patient, obtaining history from patient or surrogate, ordering and performing treatments and interventions, ordering and review of laboratory studies, ordering and review of radiographic studies, pulse oximetry and re-evaluation of patient's condition.   Tessie Fass MSN, AGACNP-BC [facility] [phone] If no answer, [phone] 03/15/2020, 7:25 AM

## 2020-03-16 DIAGNOSIS — I63512 Cerebral infarction due to unspecified occlusion or stenosis of left middle cerebral artery: Secondary | ICD-10-CM | POA: Diagnosis not present

## 2020-03-16 DIAGNOSIS — I63311 Cerebral infarction due to thrombosis of right middle cerebral artery: Secondary | ICD-10-CM | POA: Diagnosis not present

## 2020-03-16 LAB — CBC
HCT: 31.6 % — ABNORMAL LOW (ref 36.0–46.0)
Hemoglobin: 10.1 g/dL — ABNORMAL LOW (ref 12.0–15.0)
MCH: 32.6 pg (ref 26.0–34.0)
MCHC: 32 g/dL (ref 30.0–36.0)
MCV: 101.9 fL — ABNORMAL HIGH (ref 80.0–100.0)
Platelets: 322 10*3/uL (ref 150–400)
RBC: 3.1 MIL/uL — ABNORMAL LOW (ref 3.87–5.11)
RDW: 14.2 % (ref 11.5–15.5)
WBC: 14.7 10*3/uL — ABNORMAL HIGH (ref 4.0–10.5)
nRBC: 0 % (ref 0.0–0.2)

## 2020-03-16 LAB — GLUCOSE, CAPILLARY
Glucose-Capillary: 158 mg/dL — ABNORMAL HIGH (ref 70–99)
Glucose-Capillary: 175 mg/dL — ABNORMAL HIGH (ref 70–99)
Glucose-Capillary: 191 mg/dL — ABNORMAL HIGH (ref 70–99)
Glucose-Capillary: 193 mg/dL — ABNORMAL HIGH (ref 70–99)
Glucose-Capillary: 190 mg/dL — ABNORMAL HIGH (ref 70–99)
Glucose-Capillary: 208 mg/dL — ABNORMAL HIGH (ref 70–99)
Glucose-Capillary: 150 mg/dL — ABNORMAL HIGH (ref 70–99)

## 2020-03-16 LAB — BASIC METABOLIC PANEL
Anion gap: 9 (ref 5–15)
BUN: 21 mg/dL — ABNORMAL HIGH (ref 6–20)
CO2: 27 mmol/L (ref 22–32)
Calcium: 8.9 mg/dL (ref 8.9–10.3)
Chloride: 110 mmol/L (ref 98–111)
Creatinine, Ser: 0.83 mg/dL (ref 0.44–1.00)
GFR calc Af Amer: >60 mL/min (ref >60)
GFR calc non Af Amer: >60 mL/min (ref >60)
Glucose, Bld: 218 mg/dL — ABNORMAL HIGH (ref 70–99)
Potassium: 3.6 mmol/L (ref 3.5–5.1)
Sodium: 146 mmol/L — ABNORMAL HIGH (ref 135–145)

## 2020-03-16 MED ORDER — POTASSIUM CHLORIDE 20 MEQ/15ML (10%) PO SOLN
40.0000 meq | Freq: Once | ORAL | Status: AC
  Administered 2020-03-16: 40 meq
  Filled 2020-03-16: qty 30

## 2020-03-16 NOTE — Progress Notes (Signed)
Physical Therapy Treatment Patient Details Name: Deborah [name] MRN: [id number] DOB: 06/09/1971 Today's Date: 03/16/2020    History of Present Illness 49 y/o female with history of IDDM, HLD, tobacco use disorder presented to [facility] for facial droop, right sided weakness and global aphasia. Found to have LVO of left M1 segment and was transferred to [facility]. Pt underwent left common carotid arteriogram followed by revascularization of L MCA, however it reoccluded due to sever distal M1 stenosis.Pt underwent trach on 9/26.    PT Comments    Pt continues to be dependent for all mobility, present with dense R hemiparesis, limited command follow, decreased attn, impaired processing, and delayed response time. Pt with noted dislike of yonker pulling head away when attempting to suction saliva coming out of R side of mouth in sitting. Worked on achieving midline position and using R UE to support. Pt sat EOB x 10 min with maxA support. Pt's sister present and attempting to engage with her at EOB. Pt with noted attempt to smile. Acute PT to cont to follow.   Follow Up Recommendations  SNF     Equipment Recommendations  Wheelchair (measurements PT);Wheelchair cushion (measurements PT);Hospital bed    Recommendations for Other Services       Precautions / Restrictions Precautions Precautions: Fall Precaution Comments: NG Restrictions Weight Bearing Restrictions: No    Mobility  Bed Mobility Overal bed mobility: Needs Assistance Bed Mobility: Supine to Sit;Sit to Supine     Supine to sit: Total assist;+2 for physical assistance;+2 for safety/equipment Sit to supine: Total assist;+2 for physical assistance;+2 for safety/equipment   General bed mobility comments: with hand over hand cues pt will use L UE to hold onto railing however doesn't demo understand when to pull or let go  Transfers                 General transfer comment: NT  Ambulation/Gait              General Gait Details: unable   Stairs             Wheelchair Mobility    Modified Rankin (Stroke Patients Only) Modified Rankin (Stroke Patients Only) Pre-Morbid Rankin Score: No symptoms Modified Rankin: Severe disability     Balance Overall balance assessment: Needs assistance Sitting-balance support: Single extremity supported;Feet unsupported Sitting balance-Leahy Scale: Poor Sitting balance - Comments: overall requiring max-totalA for sitting balance, pt intermittently with posterior push; she is briefly able to maintain static balance using RUE support and very close minguard assist; delayed righting reaction noted Postural control: Posterior lean;Right lateral lean                                  Cognition Arousal/Alertness: Awake/alert Behavior During Therapy: Flat affect Overall Cognitive Status: Impaired/Different from baseline Area of Impairment: Orientation;Attention;Following commands;Problem solving                 Orientation Level: Disoriented to;Place;Time;Situation (will turn head to name) Current Attention Level: Focused   Following Commands: Follows one step commands inconsistently;Follows one step commands with increased time     Problem Solving: Slow processing;Decreased initiation;Difficulty sequencing;Requires verbal cues;Requires tactile cues General Comments: follows basic commands 25% of time, pt requiring hand over hand to complete ie. high five, thumbs up, showing 2 fingers      Exercises General Exercises - Lower Extremity Long Arc Quad: AROM;Left;5 reps;Seated (R LE PROM in sitting, due  to flaccidity)    General Comments General comments (skin integrity, edema, etc.): VSS      Pertinent Vitals/Pain Pain Assessment: Faces Faces Pain Scale: No hurt    Home Living                      Prior Function            PT Goals (current goals can now be found in the care plan section) Progress towards  PT goals: Progressing toward goals    Frequency    Min 3X/week      PT Plan Current plan remains appropriate    Co-evaluation              AM-PAC PT "6 Clicks" Mobility   Outcome Measure  Help needed turning from your back to your side while in a flat bed without using bedrails?: Total Help needed moving from lying on your back to sitting on the side of a flat bed without using bedrails?: Total Help needed moving to and from a bed to a chair (including a wheelchair)?: Total Help needed standing up from a chair using your arms (e.g., wheelchair or bedside chair)?: Total Help needed to walk in hospital room?: Total Help needed climbing 3-5 steps with a railing? : Total 6 Click Score: 6    End of Session Equipment Utilized During Treatment: Oxygen Activity Tolerance: Patient tolerated treatment well Patient left: in bed;with call bell/phone within reach;with bed alarm set Nurse Communication: Mobility status;Need for lift equipment PT Visit Diagnosis: Other abnormalities of gait and mobility (R26.89);Muscle weakness (generalized) (M62.81);Other symptoms and signs involving the nervous system (R29.898);Hemiplegia and hemiparesis Hemiplegia - Right/Left: Right Hemiplegia - caused by: Cerebral infarction     Time: 6294-7654 PT Time Calculation (min) (ACUTE ONLY): 24 min  Charges:  $Therapeutic Exercise: 8-22 mins $Neuromuscular Re-education: 8-22 mins                     Lewis Shock, PT, DPT Acute Rehabilitation Services Pager #: [phone] Office #: [phone]    Iona Hansen 03/16/2020, 1:06 PM

## 2020-03-16 NOTE — Progress Notes (Signed)
NAME:  Quintin AltoSylvia Nuttall, MRN:  [id number], DOB:  02/08/1971, LOS: 8 ADMISSION DATE:  03/08/2020, CONSULTATION DATE:  03/08/20 REFERRING MD:  Neurology, CHIEF COMPLAINT:  CVA    Brief History   49 y/o female with history of IDDM, HLD, tobacco use disorder presented to [facility] for facial droop, right sided weakness and global aphasia. Found to have LVO of left M1 segment and was transferred to [facility] for attempt at endovascular revascularization.   History of present illness   49 y/o female presented after being found by her husband this morning with facial droop, right sided weakness, and global aphasia. LKN was 8:00 pm the night before. NIH score of 21 on arrival. She was immediately taken to IR and intubated for attempt at endovascular revascularization. Unfortunately, she had immediate reocclusion after achieving TICI 2c revascularization. Rescue stent not performed due to increased risk of ICH. She was transferred to the ICU for continued monitoring post-procedure.   Past Medical History  IDDM HLD Tobacco use Migraine headaches Chronic pain  Anxiety   Significant Hospital Events   9/21 > intubated for IR procedure 9/21> unsuccessful revascularization 9/21> admitted to neuro ICU 9/24: pt remains on vent on light sedation awake but able to utilize extremities to follow commands. Does hold eyes shut on command. Unable to perform tongue thrust. Pt and family would benefit from palliative consult, d/w Dr Roda ShuttersXu (he will speak with pt's boyfriend who son is deferring decisions to). 3% stopped overnight 2/2 Na >155. Neurosx signing off despite some progressive shift on imaging. 9/25: Planning for tracheostomy today-this is rescheduled 9/26: No overnight events  Consults:  PCCM Neurosurgery   Procedures:  9/21>cerebral arteriogram with emergent mechanical thrombectomy  9/26 trach>  Significant Diagnostic Tests:  9/21 CT head > Acute left MCA territory infarct without hemorrhagic  conversion or progression from CT earlier today. Aspects remains 6  9/22 at 1 am CT head >Continued interval evolution of large left MCA territory infarct, overall similar in size and distribution from previous. Associated regional mass effect with partial effacement of the left lateral ventricle has mildly worsened, with worsened left-to-right midline shift now measuring up to 5 mm 9/22 at 7:50 am CT head > Unchanged extent of a large acute/early subacute left MCA vascular territory infarct and associated hemorrhage as compared to the head CT performed earlier the same day at 12:57 a.m. Continued slight interval increase in mass effect with partial effacement of the left lateral ventricle and now 6 mm rightward midline shift (previously 5 mm). The basal cisterns remain patent. No evidence of ventricular entrapment. 9/22 MRI brain > Large territory infarct left MCA territory with moderate hemorrhage and mild subarachnoid hemorrhage. There is edema and mass-effect with 7 mm midline shift to the right. Left MCA is occluded in the M1 segment distal to an early temporal branch. No other intracranial stenosis 9/24 cth: Acute left MCA territory infarct with mildly progressive swelling (9 mm midline shift) and regressed hemorrhage.   Micro Data:  N/A  Antimicrobials:  N/A   Interim history/subjective:  Has been on trach collar since 1400 03/15/20  FiO2 now 28% flow 5lpm PMV with SLP 9/28  Objective   Blood pressure (!) 142/65, pulse 71, temperature 99.1 F (37.3 C), temperature source Axillary, resp. rate (!) 22, height 5' (1.524 m), weight 91.2 kg, SpO2 95 %.    Vent Mode: PSV;CPAP FiO2 (%):  [28 %-40 %] 28 % Pressure Support:  [12 cmH20] 12 cmH20   Intake/Output Summary (Last  24 hours) at 03/16/2020 0811 Last data filed at 03/16/2020 0700 Gross per 24 hour  Intake 990 ml  Output 1150 ml  Net -160 ml   Filed Weights   03/14/20 0500 03/15/20 0500 03/16/20 0500  Weight: 91.5 kg 91  kg 91.2 kg    Examination:  General: WDWN middle aged F supine in bed NAD on trach collar  HENT: NCAT pink mmm, scant clear oral secretions. Trach secure Lungs: CTAb. Even, unlabored respirations. Symmetrical chest expansion  Cardiovascular: RRR s1s2 no rgm   Abdomen:  Soft round ndnt + bowel sounds  Extremities: LUE PICC. Non-pitting dependent extremity edema  Neuro: Opens eyes spontaneously. Moves L side spontaneously, not following commands. Mimicking gestures  Resolved Hospital Problem list     Assessment & Plan:   Left MCA stroke, unsuccessful revascularization -Neuro status remains unchanged-- CT H with stable L MCA infarct + stable 96mm L to R midline shift  Cerebral edema  P -per neuro  -off 3% -- from PCCM perspective, not sure we need to continue PICC at this time. Defer to primary team   Acute respiratory failure secondary to inadequate airway protection -s/p tracheostomy 9/26 P -Continue trach collar -routine trach care, pulm hygiene  -if continues to tolerate without MV support, likely approaching readiness to transfer out of ICU  -SLP following  Inadequate PO intake -in setting of CVA above, dysphagia, tracheostomy status P -currently EN per RDN via cortak -Ultimately wonder if pt will need PEG   Insulin-dependent diabetes with severe hyperglycemia -Continue SSI  Migraines Chronic pain and anxiety Home meds include Xanax, Fioricet, gabapentin, Flexeril, tramadol P -holding home meds   Hypokalemia, improving -low normal 9/29 -will give low dose supplementation 9/29 -trend BMP   Best practice:  Diet: Tube feeds Pain/Anxiety/Delirium protocol (if indicated): off VAP protocol (if indicated): Y DVT prophylaxis: lovenox  GI prophylaxis: protonix  Glucose control: SSI, Lantus Mobility: Bedrest Code Status: FULL Family Communication: per primary Disposition: In ICU. Likely approaching readiness to transfer out of ICU, pending tolerance of trach  collar   Labs   CBC: Recent Labs  Lab 03/09/20 1119 03/09/20 1119 03/10/20 0449 03/10/20 0449 03/11/20 0437 03/12/20 0441 03/13/20 0400 03/15/20 0900 03/16/20 0528  WBC 18.7*   < > 13.1*   < > 13.7* 13.3* 14.3* 14.8* 14.7*  NEUTROABS 14.7*  --  9.1*  --  9.4* 9.2* 10.8*  --   --   HGB 12.1   < > 11.4*   < > 10.5* 10.5* 10.2* 9.7* 10.1*  HCT 37.3   < > 37.0   < > 35.1* 34.3* 32.8* 31.7* 31.6*  MCV 100.0   < > 105.7*   < > 107.3* 105.9* 102.8* 103.3* 101.9*  PLT 284   < > 284   < > 257 259 239 282 322   < > = values in this interval not displayed.    Basic Metabolic Panel: Recent Labs  Lab 03/10/20 1816 03/10/20 2330 03/11/20 0437 03/11/20 1134 03/11/20 1939 03/11/20 1939 03/12/20 0441 03/12/20 0924 03/13/20 0400 03/13/20 1316 03/14/20 1512 03/14/20 2104 03/15/20 0257 03/15/20 0900 03/16/20 0528  NA 160*   < > 155*   < > 153*   < > 151*  150*   < > 146*   < > 156* 151* 149* 149* 146*  K  --   --  3.7  --   --   --  3.9  --  4.0  --   --   --   --  3.1* 3.6  CL  --   --  126*  --   --   --  117*  --  112*  --   --   --   --  115* 110  CO2  --   --  22  --   --   --  24  --  25  --   --   --   --  25 27  GLUCOSE  --   --  170*  --   --   --  256*  --  198*  --   --   --   --  143* 218*  BUN  --   --  12  --   --   --  16  --  18  --   --   --   --  20 21*  CREATININE  --   --  0.98  --   --   --  0.93  --  0.78  --   --   --   --  0.78 0.83  CALCIUM  --   --  8.7*  --   --   --  8.7*  --  8.8*  --   --   --   --  8.5* 8.9  MG 2.4  --  2.2  --  2.0  --   --   --   --   --   --   --   --   --   --   PHOS 2.9  --  3.5  --  3.3  --   --   --   --   --   --   --   --   --   --    < > = values in this interval not displayed.   GFR: Estimated Creatinine Clearance: 84.4 mL/min (by C-G formula based on SCr of 0.83 mg/dL). Recent Labs  Lab 03/12/20 0441 03/13/20 0400 03/15/20 0900 03/16/20 0528  WBC 13.3* 14.3* 14.8* 14.7*    Liver Function Tests: No results for  input(s): AST, ALT, ALKPHOS, BILITOT, PROT, ALBUMIN in the last 168 hours. No results for input(s): LIPASE, AMYLASE in the last 168 hours. No results for input(s): AMMONIA in the last 168 hours.  ABG    Component Value Date/Time   PHART 7.347 (L) 03/08/2020 1544   PCO2ART 39.2 03/08/2020 1544   PO2ART 103 03/08/2020 1544   HCO3 21.6 03/08/2020 1544   TCO2 23 03/08/2020 1544   ACIDBASEDEF 4.0 (H) 03/08/2020 1544   O2SAT 98.0 03/08/2020 1544     Coagulation Profile: No results for input(s): INR, PROTIME in the last 168 hours.  Cardiac Enzymes: No results for input(s): CKTOTAL, CKMB, CKMBINDEX, TROPONINI in the last 168 hours.  HbA1C: Hgb A1c MFr Bld  Date/Time Value Ref Range Status  03/09/2020 11:19 AM 10.7 (H) 4.8 - 5.6 % Final    Comment:    (NOTE) Pre diabetes:          5.7%-6.4%  Diabetes:              >6.4%  Glycemic control for   <7.0% adults with diabetes     CBG: Recent Labs  Lab 03/14/20 1917 03/14/20 2312 03/15/20 0303 03/15/20 0721 03/15/20 1133  GLUCAP 161* 183* 172* 125* 149*    CRITICAL CARE Performed by: Lanier Clam   Total critical care time: 35 minutes  Critical care time was exclusive of  separately billable procedures and treating other patients.  Critical care was necessary to treat or prevent imminent or life-threatening deterioration.  Critical care was time spent personally by me on the following activities: development of treatment plan with patient and/or surrogate as well as nursing, discussions with consultants, evaluation of patient's response to treatment, examination of patient, obtaining history from patient or surrogate, ordering and performing treatments and interventions, ordering and review of laboratory studies, ordering and review of radiographic studies, pulse oximetry and re-evaluation of patient's condition.   Tessie Fass MSN, AGACNP-BC [facility] [phone] If no answer,  [phone] 03/16/2020, 8:12 AM

## 2020-03-16 NOTE — Progress Notes (Signed)
STROKE TEAM PROGRESS NOTE   INTERVAL HISTORY Her sister is at the bedside. Patient has done well from respiratory standpoint and managed to tolerate trach collar for no more than 24 h..  Vital signs stable.  Neuro exam unchanged.  She remains aphasic with right hemiparesis. Vitals:   03/16/20 1300 03/16/20 1400 03/16/20 1500 03/16/20 1548  BP: 120/64 121/66 140/78 140/78  Pulse: 84 67 84 71  Resp: (!) 22 17 20  (!) 23  Temp:      TempSrc:      SpO2: 97% 98% 98% 97%  Weight:      Height:       CBC:  Recent Labs  Lab 03/12/20 0441 03/12/20 0441 03/13/20 0400 03/13/20 0400 03/15/20 0900 03/16/20 0528  WBC 13.3*   < > 14.3*   < > 14.8* 14.7*  NEUTROABS 9.2*  --  10.8*  --   --   --   HGB 10.5*   < > 10.2*   < > 9.7* 10.1*  HCT 34.3*   < > 32.8*   < > 31.7* 31.6*  MCV 105.9*   < > 102.8*   < > 103.3* 101.9*  PLT 259   < > 239   < > 282 322   < > = values in this interval not displayed.   Basic Metabolic Panel:  Recent Labs  Lab 03/11/20 0437 03/11/20 1134 03/11/20 1939 03/12/20 0441 03/15/20 0900 03/16/20 0528  NA 155*   < > 153*   < > 149* 146*  K 3.7  --   --    < > 3.1* 3.6  CL 126*  --   --    < > 115* 110  CO2 22  --   --    < > 25 27  GLUCOSE 170*  --   --    < > 143* 218*  BUN 12  --   --    < > 20 21*  CREATININE 0.98  --   --    < > 0.78 0.83  CALCIUM 8.7*  --   --    < > 8.5* 8.9  MG 2.2  --  2.0  --   --   --   PHOS 3.5  --  3.3  --   --   --    < > = values in this interval not displayed.   IMAGING past 24 hours No results found.   PHYSICAL EXAM     General -mildly obese middle-aged Caucasian female, s/p tracheostomy.  Ophthalmologic - fundi not visualized due to noncooperation.  Cardiovascular - Regular rate and rhythm.  Neuro -s/p tracheostomy alert eyes open on voice, seem to able to follow eye opening and closure, still not following peripheral commands. With eye opening, eyes in left gaze preference but able to cross midline briefly, right  gaze incomplete, not blinking to visual threat on the right, doll's eyes present, tracking on the left not on the right. Pupil right 67mm and left 2.1mm, under dark more prominent, consistent with left horner syndrome. Corneal reflex present, gag and cough present. Breathing over the vent.  Facial symmetry not able to test due to ET tube.  Tongue protrusion not cooperative. Able to hold LUE against gravity without drift, LLE withdraw to pain and able to hold knee flexion and foot in bed position without drift. RUE flaccid and RLE mild withdraw to pain but increased muscle tone, not able to hold on knee flexion and foot in  bed position. DTR 1+ and no babinski. Sensation, coordination and gait not tested.   ASSESSMENT/PLAN Ms. Kadedra Vanaken is a 49 y.o. female with history of difficulty to control DB, HLD, syncope presenting to [facility] with R hemiparesis, facial droop, left gaze, and global aphasia w/ glucose 400s where she was found to have L M1 occlusion and transferred to [facility] for IR.   Stroke:   L MCA infarct s/p IR w/ post procedure hemorrhage, infarct secondary to large vessel disease source  CT head [facility]) L MCA infarct w/ ASPECTS 6  CTA head & neck [facility]) L M2 cutoff  CT head L MCA territory infarct. ASPECTS 6.   CT perfusion 63 core L brain same as CT. 32 penumbra. 1.5 mismatch ratio.  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 Post IR hemorrhage.  CT head 9/21 1818 increased L MCA ischemic infarct w/ mass effect and partial effacement L lateral ventrilc w/ 23mm midline shift. Small foci L BG HT same w/ increased L perisylvian and L cerebral convexity hemorrhage.   CT head 9/22 0151 evolution large L MCA infarct w/ slightly worse effacement and mass effect w/ 75mm midline shift. Scattered foci hemorrhage in infarct same w/ contrast clearing. L M1 and  proximal L MCA branches w/ persistent hyperdensity.   CT head 9/22 0827  L MCA infarct and hemorrhages same. Increase in mass effect and effacement now 11mm midline shift. L M1 and proximal L MCA braches w/ persistent hyperdensity.   MRI 9/22 Large L MCA infarct w/ moderate infarct and SAH. Edema, 9mm midline shift.   MRA 9/22 L MCA 1 occlusion   CT head 9/24 L MCA infarct w/ mild increase in edema, 23mm midline shift    CT repeat 9/26 - Stable extent of left MCA territory infarct with small volume blood products. Midline shift continues to measure 9 mm.  2D Echo EF 60-65%. No source of embolus   Recent Zio patch Normal w/ infrequent PACs, PVCs 01/07/2020  LDL 174   HgbA1c 10.7   VTE prophylaxis - Lovenox 40 mg sq daily    clopidogrel 75 mg daily prior to admission, now on No antithrombotic given post IR hemorrhage. Repeat CT in am, if hemorrhage stable, will consider ASA 81.   Therapy recommendations:  Skilled Nursing Facility   Disposition:  pending   Acute Respiratory Failure  Secondary to stroke  Intubated for IR, left intubated post IR    Off sedation  CCM on board   Trach placed 03/13/20  Weaning underway     Cerebral Edema  MRI 9/22 with MLS @ 65mm  CT head 9/24 L MCA infarct w/ mild increase in edema, 5mm midline shift    CT repeat 9/26 - Stable extent of left MCA territory infarct with small volume blood products. Midline shift continues to measure 9 mm.  On 3% protocol - @75 ->50->NS  23.4% x 1 (9/25)  Goal Na 150-160  Na 154->153->151->149->147->146->156->151->14->149  NS onboard 09-29-1970) - no hemicrani needed at this time    HOB > 30  Has PICC  Fever  LUE Shivering  Tmax 99.4  Leukocytosis WBC 24.2->18.7->13.1->13.7->13.3->14.3->14.8   EEG L slowing, no sz  UA -> not c/w UTI  CXR - 03/11/20 - no evidence of active pulmonary disease  R Groin Firmness, improved  R distal pulse 1+  Groin Doppler R common femoral patent, no  pseudoaneurysm, AVF or  DVT.   Continue monitoring  Blood Pressure  Home meds:  None, no hx HTN  Stable . BP goal < 160 for now due to hemorrhagic transformation  . Off Cleviprex gtt  . Long-term BP goal normotensive  Hyperlipidemia  Home meds:  lipitor 20  LDL 174, goal < 70  On lipitor 80  Continue statin at discharge  Diabetes type II Uncontrolled  Home meds:  Glimepiride , lantus 21, liraglutide 1.8  HgbA1c 10.7, goal < 7.0  CBGs  Hyperglycemia improved  SSI 0-15  On lantus 28  DM coordinator on board  Dysphagia . Secondary to stroke . NPO except meds . on TF @ 47 via cortrak  . On IVF @ 52 . Speech on board  Tobacco abuse  Current smoker  Will provide smoking cessation when able    Urinary retention  Off Foley catheter after procedure  Needed I&O x4-5  Put back Foley catheter 9/25  On Urecholine  Other Stroke Risk Factors  UDS positive benzos and barb (done following IR procedure)   Obesity, Body mass index is 39.27 kg/m., recommend weight loss, diet and exercise as appropriate   Family hx stroke (paternal grandfather)  Migraines on fioricet  Other Active Problems  Chronic pain, Anxiety  Acute blood loss anemia s/p IR, Hgb 14.3->12.1->11.4->10.5->10.5->10.2->9.7   Palliative Care consult 03/12/20 - per pt's son - patient has stated she would not want life prolonged in vegetative state or with significant loss of function - would like to allow time for outcomes prior to making further decisions.  Hypokalemia 3.1 - supplemented  Hospital day # 8 Continue   trach collar as tolerated as per CCM. Transfer to neurology floor bed later today. Speech therapy to do swallow eval. Likely transfer to inpatient rehab in a few days if bed available. Long discussion with patient and sister at the bedside and answered questions.  Continue ongoing treatment.  Discussed with Dr. Kendrick Fries critical care medicine. Greater than 50% time during this  35-minute visit was spent on counseling and coordination of care about her aphasia, tracheostomy and respiratory failure and answering questions  .    Delia Heady, MD     To contact Stroke Continuity provider, please refer to [email or web address]. After hours, contact General Neurology

## 2020-03-16 NOTE — TOC Initial Note (Signed)
Transition of Care [facility]) - Initial/Assessment Note    Patient Details  Name: Deborah [name] MRN: [id number] Date of Birth: 1970/11/19  Transition of Care [facility]) CM/SW Contact:    Lorri Frederick, LCSW Phone Number: 03/16/2020, 4:04 PM  Clinical Narrative:  CSW attempted to meet with pt in ICU.  Pt unable to answer questions, RN there assisted in communication by blinking.  Pt agreed that CSW would contact son or husband.  CSW attempted to contact son, unsuccessful, but did speak with  [facility] (listed as significant other but identified self as husband.)   Discussed SNF recommendation and he is in agreement.  Choice not provided to pt today, still needs to be done.  Pt is not vaccinated.  CSW informed him that pt is transferring from ICU.  Pt son, Fayrene Fearing, also called back and CSW also spoke with him about transfer and SNF plan as well.                 Expected Discharge Plan: Skilled Nursing Facility Barriers to Discharge: Continued Medical Work up, SNF Pending bed offer   Patient Goals and CMS Choice Patient states their goals for this hospitalization and ongoing recovery are:: get as close as she can back to where she was      Expected Discharge Plan and Services Expected Discharge Plan: Skilled Nursing Facility     Post Acute Care Choice: Skilled Nursing Facility Living arrangements for the past 2 months: Single Family Home                                      Prior Living Arrangements/Services Living arrangements for the past 2 months: Single Family Home Lives with:: Spouse Patient language and need for interpreter reviewed:: Yes        Need for Family Participation in Patient Care: Yes (Comment) Care giver support system in place?: Yes (comment)   Criminal Activity/Legal Involvement Pertinent to Current Situation/Hospitalization: No - Comment as needed  Activities of Daily Living      Permission Sought/Granted Permission sought to share  information with : Family Supports Permission granted to share information with : Yes, Verbal Permission Granted  Share Information with NAME: [name], husband, Fayrene Fearing son           Emotional Assessment Appearance:: Appears stated age Attitude/Demeanor/Rapport: Unable to Assess Affect (typically observed): Unable to Assess   Alcohol / Substance Use: Not Applicable Psych Involvement: No (comment)  Admission diagnosis:  Acute ischemic left MCA stroke (HCC) [I63.512] Cerebrovascular accident (CVA) due to thrombosis of middle cerebral artery, unspecified blood vessel laterality (HCC) [I63.319] Middle cerebral artery embolism, left [I66.02] Patient Active Problem List   Diagnosis Date Noted  . Cerebrovascular accident (CVA) due to thrombosis of middle cerebral artery (HCC)   . Palliative care by specialist   . Goals of care, counseling/discussion   . Acute ischemic left MCA stroke (HCC) 03/08/2020  . Middle cerebral artery embolism, left 03/08/2020  . Hypertension   . Dyslipidemia 12/10/2019  . Syncope and collapse 10/29/2019  . Atypical chest pain 10/29/2019  . Smoking 10/29/2019  . Anxiety 10/28/2019  . Diabetes mellitus (HCC) 10/28/2019  . Migraines 10/28/2019  . Rhinitis, allergic 10/28/2019  . Shortness of breath 10/28/2019  . Chronic pyelonephritis 09/03/2016  . Atrophic kidney 07/19/2016  . Unspecified abdominal pain 07/19/2016  . PONV (postoperative nausea and vomiting) 06/18/2005   PCP:  Luna Kitchens  A, MD Pharmacy:   [facility] [phone] [place], [place] - [address] AT [facility] [address] [place] [address] Phone: [phone] Fax: [phone]  [id number] - [place], [place] - [address] AT [facility] [address] [place] [address] Phone: [phone] Fax: [phone]     Social Determinants of Health (SDOH) Interventions    Readmission Risk Interventions No flowsheet  data found.

## 2020-03-17 DIAGNOSIS — I63512 Cerebral infarction due to unspecified occlusion or stenosis of left middle cerebral artery: Secondary | ICD-10-CM | POA: Diagnosis not present

## 2020-03-17 DIAGNOSIS — I63311 Cerebral infarction due to thrombosis of right middle cerebral artery: Secondary | ICD-10-CM | POA: Diagnosis not present

## 2020-03-17 LAB — BASIC METABOLIC PANEL
Anion gap: 12 (ref 5–15)
BUN: 22 mg/dL — ABNORMAL HIGH (ref 6–20)
CO2: 26 mmol/L (ref 22–32)
Calcium: 9 mg/dL (ref 8.9–10.3)
Chloride: 105 mmol/L (ref 98–111)
Creatinine, Ser: 0.81 mg/dL (ref 0.44–1.00)
GFR calc Af Amer: >60 mL/min (ref >60)
GFR calc non Af Amer: >60 mL/min (ref >60)
Glucose, Bld: 194 mg/dL — ABNORMAL HIGH (ref 70–99)
Potassium: 3.8 mmol/L (ref 3.5–5.1)
Sodium: 143 mmol/L (ref 135–145)

## 2020-03-17 LAB — GLUCOSE, CAPILLARY
Glucose-Capillary: 182 mg/dL — ABNORMAL HIGH (ref 70–99)
Glucose-Capillary: 182 mg/dL — ABNORMAL HIGH (ref 70–99)
Glucose-Capillary: 205 mg/dL — ABNORMAL HIGH (ref 70–99)
Glucose-Capillary: 205 mg/dL — ABNORMAL HIGH (ref 70–99)
Glucose-Capillary: 212 mg/dL — ABNORMAL HIGH (ref 70–99)
Glucose-Capillary: 213 mg/dL — ABNORMAL HIGH (ref 70–99)
Glucose-Capillary: 216 mg/dL — ABNORMAL HIGH (ref 70–99)
Glucose-Capillary: 219 mg/dL — ABNORMAL HIGH (ref 70–99)
Glucose-Capillary: 228 mg/dL — ABNORMAL HIGH (ref 70–99)

## 2020-03-17 MED ORDER — SENNOSIDES-DOCUSATE SODIUM 8.6-50 MG PO TABS: 1.0000 | ORAL_TABLET | Freq: Every evening | ORAL | Status: DC | PRN

## 2020-03-17 NOTE — Progress Notes (Signed)
  Speech Language Pathology Treatment: Cognitive-Linquistic  Patient Details Name: Deborah [name] MRN: [id number] DOB: April 10, 1971 Today's Date: 03/17/2020 Time: 3212-2482 SLP Time Calculation (min) (ACUTE ONLY): 12 min  Assessment / Plan / Recommendation Clinical Impression  Attempted to place PMV, but pt is still not able to tolerate it more than a few respiratory cycles, so it was left off for the duration of the session. Continue to recommend cuffless and/or smaller trach.   Tx focused on basic receptive and expressive language. Pt followed a few one-step commands with LUE with demonstration from SLP. Attempted having her read instructions, but she did not comprehend. Mod-max cues were still needed for command following during more functional self-care task. SLP provided model to try to elicit speech sounds at the phoneme level with no approximation of lips/tongue observed. Will continue to follow.    HPI HPI: 49 y/o female with history of IDDM, HLD, tobacco use disorder presented to [facility] for facial droop, right sided weakness and global aphasia. Found to have LVO of left M1 segment and was transferred to [facility]. Pt underwent left common carotid arteriogram followed by revascularization of L MCA, however it reoccluded due to sever distal M1 stenosis.Pt underwent trach on 9/26.      SLP Plan  Continue with current plan of care       Recommendations         Patient may use Passy-Muir Speech Valve: with SLP only MD: Please consider changing trach tube to : Smaller size;Cuffless         Follow up Recommendations: Skilled Nursing facility SLP Visit Diagnosis: Aphasia (R47.01) Plan: Continue with current plan of care       GO                Mahala Menghini., M.A. CCC-SLP Acute Rehabilitation Services Pager [phone] Office [phone]  03/17/2020, 11:47 AM

## 2020-03-17 NOTE — Progress Notes (Signed)
STROKE TEAM PROGRESS NOTE   INTERVAL HISTORY Patient continues to do well on trach collar.  Neurological exam is unchanged.  Vital signs are stable.  She is still awaiting bed for transfer to floor.  No changes   Vitals:   03/17/20 0337 03/17/20 0400 03/17/20 0500 03/17/20 0600  BP:  (!) 143/78 139/67 (!) 143/64  Pulse: 81 68 70 77  Resp: (!) 23 (!) 23 (!) 21 20  Temp:  98.7 F (37.1 C)    TempSrc:  Axillary    SpO2: 96% 97% 97% 97%  Weight:   92.1 kg   Height:       CBC:  Recent Labs  Lab 03/12/20 0441 03/12/20 0441 03/13/20 0400 03/13/20 0400 03/15/20 0900 03/16/20 0528  WBC 13.3*   < > 14.3*   < > 14.8* 14.7*  NEUTROABS 9.2*  --  10.8*  --   --   --   HGB 10.5*   < > 10.2*   < > 9.7* 10.1*  HCT 34.3*   < > 32.8*   < > 31.7* 31.6*  MCV 105.9*   < > 102.8*   < > 103.3* 101.9*  PLT 259   < > 239   < > 282 322   < > = values in this interval not displayed.   Basic Metabolic Panel:  Recent Labs  Lab 03/11/20 0437 03/11/20 1134 03/11/20 1939 03/12/20 0441 03/16/20 0528 03/17/20 0426  NA 155*   < > 153*   < > 146* 143  K 3.7  --   --    < > 3.6 3.8  CL 126*  --   --    < > 110 105  CO2 22  --   --    < > 27 26  GLUCOSE 170*  --   --    < > 218* 194*  BUN 12  --   --    < > 21* 22*  CREATININE 0.98  --   --    < > 0.83 0.81  CALCIUM 8.7*  --   --    < > 8.9 9.0  MG 2.2  --  2.0  --   --   --   PHOS 3.5  --  3.3  --   --   --    < > = values in this interval not displayed.   IMAGING past 24 hours No results found.   PHYSICAL EXAM      General -mildly obese middle-aged Caucasian female, s/p tracheostomy.  Ophthalmologic - fundi not visualized due to noncooperation.  Cardiovascular - Regular rate and rhythm.  Neuro -s/p tracheostomy alert eyes open on voice, seem to able to follow eye opening and closure, still not following peripheral commands. With eye opening, eyes in left gaze preference but able to cross midline briefly, right gaze incomplete, not  blinking to visual threat on the right, doll's eyes present, tracking on the left not on the right. Pupil right 46mm and left 2.38mm, under dark more prominent, consistent with left horner syndrome. Corneal reflex present, gag and cough present. Breathing over the vent.  Facial symmetry not able to test due to ET tube.  Tongue protrusion not cooperative. Able to hold LUE against gravity without drift, LLE withdraw to pain and able to hold knee flexion and foot in bed position without drift. RUE flaccid and RLE mild withdraw to pain but increased muscle tone, not able to hold on knee flexion and foot  in bed position. DTR 1+ and no babinski. Sensation, coordination and gait not tested.   ASSESSMENT/PLAN [name] is a 49 y.o. female with history of difficulty to control DB, HLD, syncope presenting to [facility] with R hemiparesis, facial droop, left gaze, and global aphasia w/ glucose 400s where she was found to have L M1 occlusion and transferred to [facility] for IR.   Stroke:   L MCA infarct s/p IR w/ post procedure hemorrhage, infarct secondary to large vessel disease source  CT head [facility]) L MCA infarct w/ ASPECTS 6  CTA head & neck [facility]) L M2 cutoff  CT head L MCA territory infarct. ASPECTS 6.   CT perfusion 63 core L brain same as CT. 32 penumbra. 1.5 mismatch ratio.  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 Post IR hemorrhage.  CT head 9/21 1818 increased L MCA ischemic infarct w/ mass effect and partial effacement L lateral ventrilc w/ 71mm midline shift. Small foci L BG HT same w/ increased L perisylvian and L cerebral convexity hemorrhage.   CT head 9/22 0151 evolution large L MCA infarct w/ slightly worse effacement and mass effect w/ 88mm midline shift. Scattered foci hemorrhage in infarct same w/ contrast clearing. L M1 and proximal L MCA branches w/  persistent hyperdensity.   CT head 9/22 0827  L MCA infarct and hemorrhages same. Increase in mass effect and effacement now 52mm midline shift. L M1 and proximal L MCA braches w/ persistent hyperdensity.   MRI 9/22 Large L MCA infarct w/ moderate infarct and SAH. Edema, 47mm midline shift.   MRA 9/22 L MCA 1 occlusion   CT head 9/24 L MCA infarct w/ mild increase in edema, 88mm midline shift    CT repeat 9/26 - Stable extent of left MCA territory infarct with small volume blood products. Midline shift continues to measure 9 mm.  2D Echo EF 60-65%. No source of embolus   Recent Zio patch Normal w/ infrequent PACs, PVCs 01/07/2020  LDL 174   HgbA1c 10.7   VTE prophylaxis - Lovenox 40 mg sq daily    clopidogrel 75 mg daily prior to admission, now on No antithrombotic given post IR hemorrhage. Repeat CT in am, if hemorrhage stable, will consider ASA 81.   Therapy recommendations:  Skilled Nursing Facility   Disposition:  pending   Acute Respiratory Failure  Secondary to stroke  Intubated for IR, left intubated post IR    Off sedation  CCM on board   Trach placed 03/13/20  Weaning underway     Cerebral Edema  MRI 9/22 with MLS @ 4mm  CT head 9/24 L MCA infarct w/ mild increase in edema, 24mm midline shift    CT repeat 9/26 - Stable extent of left MCA territory infarct with small volume blood products. Midline shift continues to measure 9 mm.  On 3% protocol - @75 ->50->NS  23.4% x 1 (9/25)  Goal Na 150-160  Na 154->153->151->149->147->146->156->151->14->149 - - - 143  NS onboard (Thomas) - no hemicrani needed at this time    HOB > 30  Has PICC  Fever  LUE Shivering  Tmax 99.4 - - - temp 98.7  Leukocytosis WBC 24.2->18.7->13.1->13.7->13.3->14.3-> 14.9 - - 14.7  EEG L slowing, no sz  UA -> not c/w UTI  CXR - 03/11/20 - no evidence of active pulmonary disease  R Groin Firmness, improved  R distal pulse  1+  Groin Doppler R common femoral patent, no  pseudoaneurysm, AVF or DVT.   Continue monitoring  Blood Pressure  Home meds:  None, no hx HTN  Stable . BP goal < 160 for now due to hemorrhagic transformation  . Off Cleviprex gtt  . Long-term BP goal normotensive  Hyperlipidemia  Home meds:  lipitor 20  LDL 174, goal < 70  On lipitor 80  Continue statin at discharge  Diabetes type II Uncontrolled  Home meds:  Glimepiride , lantus 21, liraglutide 1.8  HgbA1c 10.7, goal < 7.0  CBGs  Hyperglycemia improved  SSI 0-15  On lantus 28  DM coordinator on board  Dysphagia . Secondary to stroke . NPO except meds . on TF @ 45 via cortrak  . On IVF @ 52 . Speech on board  Tobacco abuse  Current smoker  Will provide smoking cessation when able    Urinary retention  Off Foley catheter after procedure  Needed I&O x4-5  Put back Foley catheter 9/25  On Urecholine  Other Stroke Risk Factors  UDS positive benzos and barb (done following IR procedure)   Obesity, Body mass index is 39.65 kg/m., recommend weight loss, diet and exercise as appropriate   Family hx stroke (paternal grandfather)  Migraines on fioricet  Other Active Problems  Chronic pain, Anxiety  Acute blood loss anemia s/p IR, Hgb 14.3->12.1->11.4->10.5->10.5->10.2->9.7 - - - 10.1  Palliative Care consult 03/12/20 - per pt's son - patient has stated she would not want life prolonged in vegetative state or with significant loss of function - would like to allow time for outcomes prior to making further decisions.  Hypokalemia 3.1 - supplemented - - - 3.8   Noted above - " Repeat CT in am, if hemorrhage stable, will consider ASA 81 " currently not on anti thrombotic due to hemorrhage.  Hospital day # 9  Continue ongoing management.  Transfer to neurology floor bed when available.  Speech therapy for swallow eval.  Hopefully transfer to skilled nursing facility later this week or early next week when bed available.  Greater than 50% time  during this 25-minute visit was spent on counseling and coordination of care about her stroke and tracheostomy and answering questions and discussion with care team Delia Heady, MD  .         To contact Stroke Continuity provider, please refer to [email or web address]. After hours, contact General Neurology

## 2020-03-17 NOTE — Progress Notes (Signed)
Report given to 3W RN. Patient transferred with all belongings. Spoke with son Fayrene Fearing to make aware of patient room update.

## 2020-03-18 DIAGNOSIS — Z515 Encounter for palliative care: Secondary | ICD-10-CM | POA: Diagnosis not present

## 2020-03-18 DIAGNOSIS — I63512 Cerebral infarction due to unspecified occlusion or stenosis of left middle cerebral artery: Secondary | ICD-10-CM | POA: Diagnosis not present

## 2020-03-18 LAB — GLUCOSE, CAPILLARY
Glucose-Capillary: 196 mg/dL — ABNORMAL HIGH (ref 70–99)
Glucose-Capillary: 203 mg/dL — ABNORMAL HIGH (ref 70–99)
Glucose-Capillary: 211 mg/dL — ABNORMAL HIGH (ref 70–99)
Glucose-Capillary: 224 mg/dL — ABNORMAL HIGH (ref 70–99)
Glucose-Capillary: 227 mg/dL — ABNORMAL HIGH (ref 70–99)
Glucose-Capillary: 284 mg/dL — ABNORMAL HIGH (ref 70–99)

## 2020-03-18 MED ORDER — CHLORHEXIDINE GLUCONATE 0.12 % MT SOLN
OROMUCOSAL | Status: AC
  Filled 2020-03-18: qty 15

## 2020-03-18 MED ORDER — PROSOURCE TF PO LIQD
45.0000 mL | Freq: Two times a day (BID) | ORAL | Status: DC
  Administered 2020-03-18 – 2020-03-24 (×12): 45 mL
  Filled 2020-03-18 (×12): qty 45

## 2020-03-18 MED ORDER — JEVITY 1.5 CAL/FIBER PO LIQD
1000.0000 mL | ORAL | Status: DC
  Administered 2020-03-18 – 2020-03-21 (×5): 1000 mL
  Filled 2020-03-18 (×6): qty 1000

## 2020-03-18 NOTE — Progress Notes (Signed)
Nutrition Follow-up  DOCUMENTATION CODES:   Obesity unspecified  INTERVENTION:   D/c Vital High Protein  Initiate Jevity 1.5 @ 45 ml/hr via cortrak tube  45 ml Prosource TF BID  200 ml free water flush every 6 hours  Tube feeding regimen provides 1700 kcal (100% of needs), 91 grams of protein, and 821 ml of H2O. Total free water: 1821 ml daily  NUTRITION DIAGNOSIS:   Inadequate oral intake related to inability to eat as evidenced by NPO status.  Ongoing  GOAL:   Patient will meet greater than or equal to 90% of their needs  Met with TF  MONITOR:   Diet advancement, Labs, Weight trends, TF tolerance, Skin, I & O's  REASON FOR ASSESSMENT:   Consult, Ventilator Enteral/tube feeding initiation and management  ASSESSMENT:   Pt with PMH of IDDM, HLD, tobacco abuse admitted to OOH with stroke symptoms. [facility] with L MCA stroke s/p unsuccessful revascularization.  9/24- cortrak tube placed (tip of tube in gastric region) 9/26- s/p trach 9/28- transitioned to trach collar  Reviewed I/O's: +612 ml x 24 hours and +2.6 L since admission  UOP: 200 ml x 24 hours  Pt lying in bed, on trach collar at time of visit. She was able to open her eyes when name was called.   TF infusing via cortrak tube: Vital High Protein @ 45 ml/hr, 45 ml Prosource TF daily. Regimen provides 1120 kcals, 105 grams protein, and 902 ml free water daily, meeting 68% of estimated kcal needs and 100% of protein needs.   SLP to continue efforts; pt may require PEG next week if does not pass swallow studies.   Medications reviewed and include miralax.   Labs reviewed: CBGS: 182-227 (inpatient orders for glycemic control are 0-15 units insulin aspart every 4 hours, 3 units insulin aspart every 4 hours, and 28 units insulin glargine daily at bedtime).   NUTRITION - FOCUSED PHYSICAL EXAM:    Most Recent Value  Orbital Region No depletion  Upper Arm Region No depletion  Thoracic and Lumbar  Region No depletion  Buccal Region No depletion  Temple Region No depletion  Clavicle Bone Region No depletion  Clavicle and Acromion Bone Region No depletion  Scapular Bone Region No depletion  Dorsal Hand No depletion  Patellar Region No depletion  Anterior Thigh Region No depletion  Posterior Calf Region No depletion  Edema (RD Assessment) Mild  Hair Reviewed  Eyes Reviewed  Mouth Reviewed  Skin Reviewed  Nails Reviewed       Diet Order:   Diet Order            Diet NPO time specified  Diet effective midnight                 EDUCATION NEEDS:   No education needs have been identified at this time  Skin:  Skin Assessment: Reviewed RN Assessment  Last BM:  03/18/20  Height:   Ht Readings from Last 1 Encounters:  03/08/20 5' (1.524 m)    Weight:   Wt Readings from Last 1 Encounters:  03/18/20 86.5 kg    Ideal Body Weight:  45.4 kg  BMI:  Body mass index is 37.24 kg/m.  Estimated Nutritional Needs:   Kcal:  1650-1850  Protein:  90-105 grams  Fluid:  > 1.6 L    Loistine Chance, RD, LDN, [place] Registered Dietitian II Certified Diabetes Care and Education Specialist Please refer to [facility] for RD and/or RD on-call/weekend/after hours  pager

## 2020-03-18 NOTE — Progress Notes (Signed)
STROKE TEAM PROGRESS NOTE   INTERVAL HISTORY Patient is sitting up in bed comfortably.  No neurological changes.  Exam is unchanged.  Vital signs stable.  CBC is pending.  Vitals:   03/18/20 0306 03/18/20 0320 03/18/20 0735 03/18/20 0747  BP: 136/64  (!) 128/58 (!) 128/58  Pulse: 79  79 81  Resp: 18  (!) 22 (!) 23  Temp: 98.8 F (37.1 C)  98.4 F (36.9 C)   TempSrc: Oral  Axillary   SpO2: 97%  95% 96%  Weight:  86.5 kg    Height:       CBC:  Recent Labs  Lab 03/12/20 0441 03/12/20 0441 03/13/20 0400 03/13/20 0400 03/15/20 0900 03/16/20 0528  WBC 13.3*   < > 14.3*   < > 14.8* 14.7*  NEUTROABS 9.2*  --  10.8*  --   --   --   HGB 10.5*   < > 10.2*   < > 9.7* 10.1*  HCT 34.3*   < > 32.8*   < > 31.7* 31.6*  MCV 105.9*   < > 102.8*   < > 103.3* 101.9*  PLT 259   < > 239   < > 282 322   < > = values in this interval not displayed.   Basic Metabolic Panel:  Recent Labs  Lab 03/11/20 1939 03/12/20 0441 03/16/20 0528 03/17/20 0426  NA 153*   < > 146* 143  K  --    < > 3.6 3.8  CL  --    < > 110 105  CO2  --    < > 27 26  GLUCOSE  --    < > 218* 194*  BUN  --    < > 21* 22*  CREATININE  --    < > 0.83 0.81  CALCIUM  --    < > 8.9 9.0  MG 2.0  --   --   --   PHOS 3.3  --   --   --    < > = values in this interval not displayed.   IMAGING past 24 hours No results found.   PHYSICAL EXAM      General -mildly obese middle-aged Caucasian female, s/p tracheostomy.  Ophthalmologic - fundi not visualized due to noncooperation.  Cardiovascular - Regular rate and rhythm.  Neuro -s/p tracheostomy alert eyes open on voice, seem to able to follow eye opening and closure, still not following peripheral commands. With eye opening, eyes in left gaze preference but able to cross midline briefly, right gaze incomplete, not blinking to visual threat on the right, doll's eyes present, tracking on the left not on the right. Pupil right 87mm and left 2.57mm, under dark more prominent,  consistent with left horner syndrome. Corneal reflex present, gag and cough present. Breathing over the vent.  Facial symmetry not able to test due to ET tube.  Tongue protrusion not cooperative. Able to hold LUE against gravity without drift, LLE withdraw to pain and able to hold knee flexion and foot in bed position without drift. RUE flaccid and RLE mild withdraw to pain but increased muscle tone, not able to hold on knee flexion and foot in bed position. DTR 1+ and no babinski. Sensation, coordination and gait not tested.   ASSESSMENT/PLAN Ms. Gennett Garcia is a 49 y.o. female with history of difficulty to control DB, HLD, syncope presenting to [facility] with R hemiparesis, facial droop, left gaze, and global aphasia w/ glucose 400s where  she was found to have L M1 occlusion and transferred to [facility] for IR.   Stroke:   L MCA infarct s/p IR w/ post procedure hemorrhage, infarct secondary to large vessel disease source  CT head [facility]) L MCA infarct w/ ASPECTS 6  CTA head & neck [facility]) L M2 cutoff  CT head L MCA territory infarct. ASPECTS 6.   CT perfusion 63 core L brain same as CT. 32 penumbra. 1.5 mismatch ratio.  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 Post IR hemorrhage.  CT head 9/21 1818 increased L MCA ischemic infarct w/ mass effect and partial effacement L lateral ventrilc w/ 54mm midline shift. Small foci L BG HT same w/ increased L perisylvian and L cerebral convexity hemorrhage.   CT head 9/22 0151 evolution large L MCA infarct w/ slightly worse effacement and mass effect w/ 66mm midline shift. Scattered foci hemorrhage in infarct same w/ contrast clearing. L M1 and proximal L MCA branches w/ persistent hyperdensity.   CT head 9/22 0827  L MCA infarct and hemorrhages same. Increase in mass effect and effacement now 57mm midline shift. L M1 and  proximal L MCA braches w/ persistent hyperdensity.   MRI 9/22 Large L MCA infarct w/ moderate infarct and SAH. Edema, 38mm midline shift.   MRA 9/22 L MCA 1 occlusion   CT head 9/24 L MCA infarct w/ mild increase in edema, 52mm midline shift    CT repeat 9/26 - Stable extent of left MCA territory infarct with small volume blood products. Midline shift continues to measure 9 mm.  2D Echo EF 60-65%. No source of embolus   Recent Zio patch Normal w/ infrequent PACs, PVCs 01/07/2020  LDL 174   HgbA1c 10.7   VTE prophylaxis - Lovenox 40 mg sq daily    clopidogrel 75 mg daily prior to admission, now on No antithrombotic given post IR hemorrhage. Repeat CT in am, if hemorrhage stable, will consider ASA 81.   Therapy recommendations:  Skilled Nursing Facility   Disposition:  pending   Acute Respiratory Failure  Secondary to stroke  Intubated for IR, left intubated post IR    Off sedation  CCM on board   Trach placed 03/13/20  Weaning underway     Cerebral Edema  MRI 9/22 with MLS @ 73mm  CT head 9/24 L MCA infarct w/ mild increase in edema, 30mm midline shift    CT repeat 9/26 - Stable extent of left MCA territory infarct with small volume blood products. Midline shift continues to measure 9 mm.  On 3% protocol - @75 ->50->NS  23.4% x 1 (9/25)  Goal Na 150-160  Na 154->153->151->149->147->146->156->151->14->149 - - - 143  NS onboard (Thomas) - no hemicrani needed at this time    HOB > 30  Has PICC  Fever  LUE Shivering  Tmax 99.4 - - - temp 98.7->98.4  Leukocytosis WBC 24.2->18.7->13.1->13.7->13.3->14.3-> 14.9 - - 14.7  EEG L slowing, no sz  UA -> not c/w UTI  CXR - 03/11/20 - no evidence of active pulmonary disease  R Groin Firmness, improved  R distal pulse 1+  Groin Doppler R common femoral patent, no pseudoaneurysm, AVF or DVT.   Continue monitoring  Blood Pressure  Home meds:  None, no hx HTN  Stable . BP goal < 160 for now due to  hemorrhagic transformation  . Off Cleviprex gtt  . Long-term BP goal  normotensive  Hyperlipidemia  Home meds:  lipitor 20  LDL 174, goal < 70  On lipitor 80  Continue statin at discharge  Diabetes type II Uncontrolled  Home meds:  Glimepiride , lantus 21, liraglutide 1.8  HgbA1c 10.7, goal < 7.0  CBGs  Hyperglycemia improved  SSI 0-15  On lantus 28  DM coordinator on board  Dysphagia . Secondary to stroke . NPO except meds . on TF @ 45 via cortrak  . On IVF @ 50 . Speech on board  Tobacco abuse  Current smoker  Will provide smoking cessation when able    Urinary retention  Off Foley catheter after procedure  Needed I&O x4-5  Put back Foley catheter 9/25  On Urecholine  Other Stroke Risk Factors  UDS positive benzos and barb (done following IR procedure)   Obesity, Body mass index is 37.24 kg/m., recommend weight loss, diet and exercise as appropriate   Family hx stroke (paternal grandfather)  Migraines on fioricet  Other Active Problems  Chronic pain, Anxiety  Acute blood loss anemia s/p IR, Hgb 14.3->12.1->11.4->10.5->10.5->10.2->9.7 - - - 10.1  Palliative Care consult 03/12/20 - per pt's son - patient has stated she would not want life prolonged in vegetative state or with significant loss of function - would like to allow time for outcomes prior to making further decisions.  Hypokalemia 3.1 - supplemented - - - 3.8   Noted above - " Repeat CT in am, if hemorrhage stable, will consider ASA 81 " currently not on anti thrombotic due to hemorrhage.   Pt now on regular neurology floor.    Check CBC in AM   Hospital day # 10  Continue trach care and ongoing medical management.  Speech therapy to continue swallow eval.  May need PEG tube early next week if unable to swallow.  Hopefully transfer to nursing home early next week for rehabilitation.   Delia Heady, MD         To contact Stroke Continuity provider, please refer to  [email or web address]. After hours, contact General Neurology

## 2020-03-18 NOTE — Plan of Care (Signed)

## 2020-03-18 NOTE — Progress Notes (Signed)
Occupational Therapy Treatment Patient Details Name: Deborah [name] MRN: [id number] DOB: Oct 27, 1970 Today's Date: 03/18/2020    History of present illness 49 y/o female with history of IDDM, HLD, tobacco use disorder presented to [facility] for facial droop, right sided weakness and global aphasia. Found to have LVO of left M1 segment and was transferred to [facility]. Pt underwent left common carotid arteriogram followed by revascularization of L MCA, however it reoccluded due to sever distal M1 stenosis.Pt underwent trach on 9/26.   OT comments  Pt. Seen for skilled OT treatment.  B UE ROM/exercises.  Active movement L ue but increased time and inconsistent with ability to return demo without hand over hand assistance.  Able to wash portions of face with LUE and washcloth with  set up. Will continue with current POC.    Follow Up Recommendations  SNF    Equipment Recommendations  Other (comment)    Recommendations for Other Services      Precautions / Restrictions Precautions Precautions: Fall Precaution Comments: NG Restrictions Weight Bearing Restrictions: No       Mobility Bed Mobility                  Transfers                      Balance                                           ADL either performed or assessed with clinical judgement   ADL    washcloth placed in L hand and pt. Initiated wiping both eyes and forehead.                                            Vision       Perception     Praxis      Cognition Arousal/Alertness: Awake/alert Behavior During Therapy: Flat affect Overall Cognitive Status: Impaired/Different from baseline                                          Exercises General Exercises - Upper Extremity Shoulder Flexion: PROM;AROM;Both;10 reps;Supine Shoulder Extension: PROM;AROM;Both;10 reps Shoulder ABduction: AROM;PROM;10 reps;Both;Supine Shoulder ADduction:  PROM;AROM;Both;10 reps;Supine Elbow Flexion: PROM;AROM;Both;10 reps;Supine Elbow Extension: PROM;AROM;Both;10 reps;Supine Wrist Flexion: PROM;AROM;Both;Supine;10 reps Wrist Extension: PROM;AROM;Both;10 reps;Supine Digit Composite Flexion: PROM;AROM;Both;10 reps;Supine Composite Extension: PROM;AROM;Both;10 reps;Supine Hand Exercises Forearm Supination: PROM;AROM;Both;10 reps;Supine Forearm Pronation: PROM;AROM;Both;10 reps;Supine Other Exercises Other Exercises: head/neck rom turning L/R   Shoulder Instructions       General Comments      Pertinent Vitals/ Pain       Pain Assessment: Faces Faces Pain Scale: No hurt  Home Living                                          Prior Functioning/Environment              Frequency  Min 2X/week        Progress Toward Goals  OT Goals(current goals can now be found in the care plan  section)  Progress towards OT goals: Progressing toward goals     Plan Discharge plan remains appropriate    Co-evaluation                 AM-PAC OT "6 Clicks" Daily Activity     Outcome Measure   Help from another person eating meals?: Total Help from another person taking care of personal grooming?: Total Help from another person toileting, which includes using toliet, bedpan, or urinal?: Total Help from another person bathing (including washing, rinsing, drying)?: Total Help from another person to put on and taking off regular upper body clothing?: Total Help from another person to put on and taking off regular lower body clothing?: Total 6 Click Score: 6    End of Session Equipment Utilized During Treatment: Oxygen  OT Visit Diagnosis: Unsteadiness on feet (R26.81);Other abnormalities of gait and mobility (R26.89);Muscle weakness (generalized) (M62.81);Other symptoms and signs involving the nervous system (R29.898);Other symptoms and signs involving cognitive function;Hemiplegia and hemiparesis Hemiplegia -  Right/Left: Right Hemiplegia - dominant/non-dominant: Dominant   Activity Tolerance Patient tolerated treatment well   Patient Left in bed;with call bell/phone within reach;with bed alarm set   Nurse Communication          Time: 0814-4818 OT Time Calculation (min): 10 min  Charges: OT General Charges $OT Visit: 1 Visit OT Treatments $Therapeutic Exercise: 8-22 mins  Boneta Lucks, COTA/L Acute Rehabilitation [phone]   Deborah [name] 03/18/2020, 11:58 AM

## 2020-03-18 NOTE — Progress Notes (Signed)
Inpatient Diabetes Program Recommendations  AACE/ADA: New Consensus Statement on Inpatient Glycemic Control (2015)  Target Ranges:  Prepandial:   less than 140 mg/dL      Peak postprandial:   less than 180 mg/dL (1-2 hours)      Critically ill patients:  140 - 180 mg/dL   Lab Results  Component Value Date   GLUCAP 203 (H) 03/18/2020   HGBA1C 10.7 (H) 03/09/2020    Review of Glycemic Control Results for ANANYA, MCCLEESE (MRN [id number]) as of 03/18/2020 12:00  Ref. Range 03/17/2020 23:30 03/18/2020 03:16 03/18/2020 07:40 03/18/2020 11:37  Glucose-Capillary Latest Ref Range: 70 - 99 mg/dL 409 (H) 811 (H) 914 (H) 203 (H)   Diabetes history: Type 2 DM Outpatient Diabetes medications: Amaryl 2 mg QAM, Lantus 28 units QD, Victoza 1.8 mg QD Current orders for Inpatient glycemic control: Lantus 28 units QHS, Novolog 3 units Q4H, Novolog 0-15 units Q4H  Inpatient Diabetes Program Recommendations:    Consider increasing Lantus to 32 units QHS.   Thanks, Lujean Rave, MSN, RNC-OB Diabetes Coordinator [phone] (8a-5p)

## 2020-03-18 NOTE — Progress Notes (Signed)
Physical Therapy Treatment Patient Details Name: Deborah [name] MRN: [id number] DOB: Sep 16, 1970 Today's Date: 03/18/2020    History of Present Illness 49 y/o female with history of IDDM, HLD, tobacco use disorder presented to [facility] for facial droop, right sided weakness and global aphasia. Found to have LVO of left M1 segment and was transferred to [facility]. Pt underwent left common carotid arteriogram followed by revascularization of L MCA, however it reoccluded due to sever distal M1 stenosis.Pt underwent trach on 9/26.    PT Comments    Patient progressing slowly towards PT goals. Follows ~25% of commands with repetition and demonstration. Tolerated standing bouts from EOB with assist of 2 with therapist supporting right knee and trunk due to right lateral lean. Able to self correct balance with max cues sitting EOB and in standing but not able to sustain. Continues to have dense right hemiplegia, decreased attention and slow processing. Difficult to fully assess cognition due to language deficits. Attempted to laugh appropriately during session. Will follow.   Follow Up Recommendations  SNF     Equipment Recommendations  Wheelchair (measurements PT);Wheelchair cushion (measurements PT);Hospital bed    Recommendations for Other Services       Precautions / Restrictions Precautions Precautions: Fall Precaution Comments: NG Restrictions Weight Bearing Restrictions: No    Mobility  Bed Mobility Overal bed mobility: Needs Assistance Bed Mobility: Supine to Sit;Sit to Supine     Supine to sit: Max assist;HOB elevated;+2 for physical assistance Sit to supine: Total assist;+2 for physical assistance   General bed mobility comments: Attempting to assist bringing LLE to EOB and bridging up through left heel to scoot bottom to EOB. Assist with bottom, RLE and trunk to get to EOB and assist with LEs and trunkt o return to supine.  Transfers Overall transfer level: Needs  assistance Equipment used: 2 person hand held assist Transfers: Sit to/from Stand Sit to Stand: Mod assist;+2 physical assistance         General transfer comment: Assist of 2 to stand from EOB with pt holding onto the chair in front of her with her LUE; therapist supporting right knee and trunk due to right lateral lean. Stood from EOB x3.  Ambulation/Gait             General Gait Details: unable   Stairs             Wheelchair Mobility    Modified Rankin (Stroke Patients Only) Modified Rankin (Stroke Patients Only) Pre-Morbid Rankin Score: No symptoms Modified Rankin: Severe disability     Balance Overall balance assessment: Needs assistance Sitting-balance support: Feet supported;Single extremity supported Sitting balance-Leahy Scale: Poor Sitting balance - Comments: Requires Min-Mod A for sitting balance with LUE support; worked on finding midline leading with head/neck, Able to initiate correcting posture but not sustain. Postural control: Right lateral lean Standing balance support: During functional activity Standing balance-Leahy Scale: Poor Standing balance comment: Requires assist of 2 for standing balance with therapist supporting right knee; right lateral lean. Able to self correct with manual cues to midline but not sustain. Fatigues.                            Cognition Arousal/Alertness: Awake/alert Behavior During Therapy: Flat affect Overall Cognitive Status: Difficult to assess Area of Impairment: Attention;Following commands;Problem solving                 Orientation Level:  (turns head to name) Current Attention  Level: Focused   Following Commands: Follows one step commands inconsistently;Follows one step commands with increased time     Problem Solving: Slow processing;Decreased initiation;Difficulty sequencing;Requires verbal cues;Requires tactile cues General Comments: follows basic commands ~25% of time with  repetition and demonstration with LUE/LE; Laughs appropriately during session x1.      Exercises General Exercises - Upper Extremity Shoulder Flexion: PROM;AROM;Both;10 reps;Supine Shoulder Extension: PROM;AROM;Both;10 reps Shoulder ABduction: AROM;PROM;10 reps;Both;Supine Shoulder ADduction: PROM;AROM;Both;10 reps;Supine Elbow Flexion: PROM;AROM;Both;10 reps;Supine Elbow Extension: PROM;AROM;Both;10 reps;Supine Wrist Flexion: PROM;AROM;Both;Supine;10 reps Wrist Extension: PROM;AROM;Both;10 reps;Supine Digit Composite Flexion: PROM;AROM;Both;10 reps;Supine Composite Extension: PROM;AROM;Both;10 reps;Supine Hand Exercises Forearm Supination: PROM;AROM;Both;10 reps;Supine Forearm Pronation: PROM;AROM;Both;10 reps;Supine Other Exercises Other Exercises: head/neck rom turning L/R    General Comments General comments (skin integrity, edema, etc.): VSS      Pertinent Vitals/Pain Pain Assessment: Faces Faces Pain Scale: No hurt    Home Living                      Prior Function            PT Goals (current goals can now be found in the care plan section) Progress towards PT goals: Progressing toward goals    Frequency    Min 3X/week      PT Plan Current plan remains appropriate    Co-evaluation              AM-PAC PT "6 Clicks" Mobility   Outcome Measure  Help needed turning from your back to your side while in a flat bed without using bedrails?: Total Help needed moving from lying on your back to sitting on the side of a flat bed without using bedrails?: Total Help needed moving to and from a bed to a chair (including a wheelchair)?: Total Help needed standing up from a chair using your arms (e.g., wheelchair or bedside chair)?: Total Help needed to walk in hospital room?: Total Help needed climbing 3-5 steps with a railing? : Total 6 Click Score: 6    End of Session Equipment Utilized During Treatment: Oxygen Activity Tolerance: Patient  tolerated treatment well Patient left: in bed;with call bell/phone within reach;with bed alarm set Nurse Communication: Mobility status;Need for lift equipment PT Visit Diagnosis: Other abnormalities of gait and mobility (R26.89);Muscle weakness (generalized) (M62.81);Other symptoms and signs involving the nervous system (R29.898);Hemiplegia and hemiparesis Hemiplegia - Right/Left: Right Hemiplegia - dominant/non-dominant:  (unable to determine) Hemiplegia - caused by: Cerebral infarction     Time: 8110-3159 PT Time Calculation (min) (ACUTE ONLY): 25 min  Charges:  $Therapeutic Activity: 8-22 mins $Neuromuscular Re-education: 8-22 mins                     Vale Haven, PT, DPT Acute Rehabilitation Services Pager [phone] Office [phone]       Deborah [name] 03/18/2020, 1:15 PM

## 2020-03-18 NOTE — Progress Notes (Addendum)
Daily Progress Note   Patient Name: Deborah [name]       Date: 03/18/2020 DOB: 1971/01/15  Age: 49 y.o. MRN#: [id number] Attending Physician: Micki Riley, MD Primary Care Physician: Lise Auer, MD Admit Date: 03/08/2020  Reason for Consultation/Follow-up:  To discuss complex medical decision making related to patient's goals of care  Subjective: Visited with patient at bedside.  She is alert and appears comfortable.  She attempts to follow commands - will smile, lift left leg.  She has a colorful blanket and two cute stuffed animals.   I ask her to smile at me if she is happy.  She looked down at her arms and legs and then looked back at me without smiling.  She seemed to have a sad expression.  Spoke with son Fayrene Fearing.  He is his mother's Horticulturist, commercial.  Discussed his hopes for continued improvement and rehab.  Discussed PEG tube placement and risks.  Fayrene Fearing is in agreement to move forward with PEG.   Assessment: Patient currently hemodynamically stable.  Hopefully making small improvements with PT.   Does not currently have a PEG.   Patient Profile/HPI: 49 y.o. female  with past medical history of IDDM, HLD, and tobacco use admitted on 03/08/2020 with L MCA stroke with hemorrhagic conversion after unsuccessful revascularization. She has a 9 mm left midline brain shift.  She has received a trach (9/26) and is currently on 5L of oxygen.    Length of Stay: 10   Vital Signs: BP 127/66 (BP Location: Right Arm)   Pulse 82   Temp 98.4 F (36.9 C) (Axillary)   Resp (!) 24   Ht 5' (1.524 m)   Wt 86.5 kg   LMP  (LMP Unknown)   SpO2 97%   BMI 37.24 kg/m  SpO2: SpO2: 97 % O2 Device: O2 Device: Tracheostomy Collar O2 Flow Rate: O2 Flow Rate (L/min): 5 L/min       Palliative  Assessment/Data:  10%     Palliative Care Plan    Recommendations/Plan:  Continue current care.  Discussed PEG with son Lynita Lombard.  He understands why it is needed and is ready to consent for it.  Palliative to follow at SNF.  Code Status:  Full code  Prognosis:   Unable to determine   Discharge Planning:  Skilled Nursing Facility for rehab with Palliative care service follow-up  Care plan was discussed with care team.  Thank you for allowing the Palliative Medicine Team to assist in the care of this patient.  Total time spent:  35 min. min.     Greater than 50%  of this time was spent counseling and coordinating care related to the above assessment and plan.  Norvel Richards, PA-C Palliative Medicine  Please contact Palliative MedicineTeam phone at [phone] for questions and concerns between 7 am - 7 pm.   Please see AMION for individual provider pager numbers.

## 2020-03-19 ENCOUNTER — Inpatient Hospital Stay (HOSPITAL_COMMUNITY): Payer: Medicaid Other

## 2020-03-19 DIAGNOSIS — Z930 Tracheostomy status: Secondary | ICD-10-CM

## 2020-03-19 DIAGNOSIS — D72829 Elevated white blood cell count, unspecified: Secondary | ICD-10-CM | POA: Diagnosis not present

## 2020-03-19 DIAGNOSIS — I63512 Cerebral infarction due to unspecified occlusion or stenosis of left middle cerebral artery: Secondary | ICD-10-CM | POA: Diagnosis not present

## 2020-03-19 DIAGNOSIS — Z978 Presence of other specified devices: Secondary | ICD-10-CM | POA: Diagnosis not present

## 2020-03-19 DIAGNOSIS — I6602 Occlusion and stenosis of left middle cerebral artery: Secondary | ICD-10-CM | POA: Diagnosis not present

## 2020-03-19 LAB — URINALYSIS, COMPLETE (UACMP) WITH MICROSCOPIC
Bilirubin Urine: NEGATIVE
Glucose, UA: >=500 mg/dL — AB
Hgb urine dipstick: NEGATIVE
Ketones, ur: NEGATIVE mg/dL
Nitrite: POSITIVE — AB
Protein, ur: NEGATIVE mg/dL
Specific Gravity, Urine: 1.01 (ref 1.005–1.030)
pH: 6 (ref 5.0–8.0)

## 2020-03-19 LAB — GLUCOSE, CAPILLARY
Glucose-Capillary: 255 mg/dL — ABNORMAL HIGH (ref 70–99)
Glucose-Capillary: 275 mg/dL — ABNORMAL HIGH (ref 70–99)
Glucose-Capillary: 276 mg/dL — ABNORMAL HIGH (ref 70–99)
Glucose-Capillary: 287 mg/dL — ABNORMAL HIGH (ref 70–99)
Glucose-Capillary: 289 mg/dL — ABNORMAL HIGH (ref 70–99)
Glucose-Capillary: 295 mg/dL — ABNORMAL HIGH (ref 70–99)

## 2020-03-19 LAB — BASIC METABOLIC PANEL
Anion gap: 11 (ref 5–15)
BUN: 24 mg/dL — ABNORMAL HIGH (ref 6–20)
CO2: 25 mmol/L (ref 22–32)
Calcium: 9.2 mg/dL (ref 8.9–10.3)
Chloride: 102 mmol/L (ref 98–111)
Creatinine, Ser: 0.88 mg/dL (ref 0.44–1.00)
GFR calc Af Amer: >60 mL/min (ref >60)
GFR calc non Af Amer: >60 mL/min (ref >60)
Glucose, Bld: 268 mg/dL — ABNORMAL HIGH (ref 70–99)
Potassium: 4.6 mmol/L (ref 3.5–5.1)
Sodium: 138 mmol/L (ref 135–145)

## 2020-03-19 LAB — CBC
HCT: 35.7 % — ABNORMAL LOW (ref 36.0–46.0)
Hemoglobin: 11.2 g/dL — ABNORMAL LOW (ref 12.0–15.0)
MCH: 31.6 pg (ref 26.0–34.0)
MCHC: 31.4 g/dL (ref 30.0–36.0)
MCV: 100.8 fL — ABNORMAL HIGH (ref 80.0–100.0)
Platelets: 283 10*3/uL (ref 150–400)
RBC: 3.54 MIL/uL — ABNORMAL LOW (ref 3.87–5.11)
RDW: 14.1 % (ref 11.5–15.5)
WBC: 17.4 10*3/uL — ABNORMAL HIGH (ref 4.0–10.5)
nRBC: 0 % (ref 0.0–0.2)

## 2020-03-19 MED ORDER — CHLORHEXIDINE GLUCONATE 0.12 % MT SOLN
OROMUCOSAL | Status: AC
  Filled 2020-03-19: qty 15

## 2020-03-19 MED ORDER — INSULIN GLARGINE 100 UNIT/ML ~~LOC~~ SOLN
35.0000 [IU] | Freq: Every day | SUBCUTANEOUS | Status: DC
  Administered 2020-03-19: 35 [IU] via SUBCUTANEOUS
  Filled 2020-03-19 (×3): qty 0.35

## 2020-03-19 MED ORDER — INSULIN ASPART 100 UNIT/ML ~~LOC~~ SOLN
5.0000 [IU] | SUBCUTANEOUS | Status: DC
  Administered 2020-03-19 – 2020-03-20 (×5): 5 [IU] via SUBCUTANEOUS

## 2020-03-19 NOTE — Plan of Care (Signed)
  Problem: Nutrition: Goal: Risk of aspiration will decrease Outcome: Progressing   Problem: Nutrition: Goal: Dietary intake will improve Outcome: Progressing   

## 2020-03-19 NOTE — Progress Notes (Signed)
STROKE TEAM PROGRESS NOTE   INTERVAL HISTORY No family at bedside. Pt lying in bed, not in distress, on trach collar, still has aphasia and right hemiplegia, but seems to follow some simple commands. Glucose still high, will increase insulin dose. WBC elevated will check UA and CXR.   Vitals:   03/19/20 0402 03/19/20 0727 03/19/20 0900 03/19/20 1128  BP:   (!) 121/57   Pulse: 81 88  90  Resp: 20 (!) 21  19  Temp:  99 F (37.2 C) 98.7 F (37.1 C)   TempSrc:  Oral Axillary   SpO2: 95% 95%  95%  Weight:      Height:       CBC:  Recent Labs  Lab 03/13/20 0400 03/15/20 0900 03/16/20 0528 03/19/20 0156  WBC 14.3*   < > 14.7* 17.4*  NEUTROABS 10.8*  --   --   --   HGB 10.2*   < > 10.1* 11.2*  HCT 32.8*   < > 31.6* 35.7*  MCV 102.8*   < > 101.9* 100.8*  PLT 239   < > 322 283   < > = values in this interval not displayed.   Basic Metabolic Panel:  Recent Labs  Lab 03/17/20 0426 03/19/20 0156  NA 143 138  K 3.8 4.6  CL 105 102  CO2 26 25  GLUCOSE 194* 268*  BUN 22* 24*  CREATININE 0.81 0.88  CALCIUM 9.0 9.2   IMAGING past 24 hours No results found.   PHYSICAL EXAM       Temp:  [98 F (36.7 C)-99.3 F (37.4 C)] 98 F (36.7 C) (10/02 1650) Pulse Rate:  [75-93] 79 (10/02 1650) Resp:  [16-22] 20 (10/02 1600) BP: (108-135)/(51-69) 135/69 (10/02 1600) SpO2:  [94 %-99 %] 94 % (10/02 1650) FiO2 (%):  [28 %] 28 % (10/02 1600) Weight:  [95.2 kg] 95.2 kg (10/02 0318)  General - Well nourished, well developed, in no apparent distress, on trach collar.  Ophthalmologic - fundi not visualized due to noncooperation.  Cardiovascular - Regular rhythm and rate.  Neuro - awake alert, eyes open, on trach collar, nonverbal, able to track bilaterally, no gaze deviation, not blinking to visual threat on the right.  Able to follow "open eyes" "close eyes" but then perseverated on that.  Slow response to peripheral commands on the left hand and foot, however not consistent.  Right  facial droop, tongue protrusion not corporative.  Left upper extremity 5/5.  Left lower extremity 4/5.  Right upper extremity flaccid.  Right lower extremity mild withdraw to pain.  Right Babinski positive. Sensation, coordination not corporative and gait not tested.    ASSESSMENT/PLAN Ms. Kadasia Kassing is a 49 y.o. female with history of difficulty to control DB, HLD, syncope presenting to [facility] with R hemiparesis, facial droop, left gaze, and global aphasia w/ glucose 400s where she was found to have L M1 occlusion and transferred to [facility] for IR.   Stroke:   L MCA infarct s/p IR w/ post procedure hemorrhage, infarct secondary to large vessel disease source  CT head [facility]) L MCA infarct w/ ASPECTS 6  CTA head & neck [facility]) L M2 cutoff  CT head L MCA territory infarct. ASPECTS 6.   CT perfusion 63 core L brain same as CT. 32 penumbra. 1.5 mismatch ratio.  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 Post IR hemorrhage.  CT head 9/21 1818 increased L MCA ischemic infarct w/ mass effect and partial effacement L lateral ventrilc w/ 56mm midline shift. Small foci L BG HT same w/ increased L perisylvian and L cerebral convexity hemorrhage.   CT head 9/22 0151 evolution large L MCA infarct w/ slightly worse effacement and mass effect w/ 67mm midline shift. Scattered foci hemorrhage in infarct same w/ contrast clearing. L M1 and proximal L MCA branches w/ persistent hyperdensity.   CT head 9/22 0827  L MCA infarct and hemorrhages same. Increase in mass effect and effacement now 52mm midline shift. L M1 and proximal L MCA braches w/ persistent hyperdensity.   MRI 9/22 Large L MCA infarct w/ moderate infarct and SAH. Edema, 74mm midline shift.   MRA 9/22 L MCA 1 occlusion   CT head 9/24 L MCA infarct w/ mild increase in edema, 49mm midline shift    CT repeat 9/26 -  Stable extent of left MCA territory infarct with small volume blood products. Midline shift continues to measure 9 mm.  2D Echo EF 60-65%. No source of embolus   Recent Zio patch Normal w/ infrequent PACs, PVCs 01/07/2020  LDL 174   HgbA1c 10.7   VTE prophylaxis - Lovenox 40 mg sq daily    clopidogrel 75 mg daily prior to admission, now on No antithrombotic. Pt allergic to ASA, and hold off plavix now due to potential PEG tube placement  Therapy recommendations:  SNF   Disposition:  pending   Acute Respiratory Failure  Secondary to stroke  Intubated for IR, left intubated post IR    Off sedation  CCM on board   Trach placed 03/13/20 - on trach collar  CCM on board for trach management     Cerebral Edema  MRI 9/22 with MLS @ 53mm  CT head 9/24 L MCA infarct w/ mild increase in edema, 41mm midline shift    CT repeat 9/26 - Stable extent of left MCA territory infarct with small volume blood products. Midline shift continues to measure 9 mm.  On 3% protocol - @75 ->50->NS-> off  23.4% x 1 (9/25)  Na 138  Leukocytosis   afebrile  WBC 14.7->17.4  UA pending  CXR pending  Hyperlipidemia  Home meds:  lipitor 20  LDL 174, goal < 70  On lipitor 80  Continue statin at discharge  Diabetes type II Uncontrolled  Home meds:  Glimepiride , lantus 21, liraglutide 1.8  HgbA1c 10.7, goal < 7.0  CBGs  Hyperglycemia continues  SSI 0-15  On lantus 28 -> 35  novolog premeal 3U Q4 -> 5U Q4  Dysphagia . Secondary to stroke . NPO except meds . on TF @ 45 via cortrak  . On FW flush 60cc Q4 . Likely needs PEG - hold off plavix for now  . Speech on board  Tobacco abuse  Current smoker  Will provide smoking cessation when able    Urinary retention  Off Foley catheter after procedure  Needed I&O x4-5  Put back Foley catheter 9/25 -> off  On Urecholine  Other Stroke Risk Factors  UDS positive benzos and barb (done following IR procedure)    Obesity, Body mass index is 40.99 kg/m., recommend weight loss, diet and exercise as appropriate   Family hx stroke (paternal grandfather)  Migraines on fioricet  Other Active Problems  Chronic pain, Anxiety  Palliative Care consult 03/12/20 - per pt's son - patient has stated she would not want life prolonged in vegetative state or with  significant loss of function - would like to allow time for outcomes prior to making further decisions.  Hypokalemia 3.1 - supplemented - 3.8->4.6   Hospital day # 11   Marvel Plan, MD PhD Stroke Neurology 03/19/2020 5:38 PM   To contact Stroke Continuity provider, please refer to [email or web address]. After hours, contact General Neurology

## 2020-03-20 DIAGNOSIS — I63512 Cerebral infarction due to unspecified occlusion or stenosis of left middle cerebral artery: Secondary | ICD-10-CM | POA: Diagnosis not present

## 2020-03-20 DIAGNOSIS — D72829 Elevated white blood cell count, unspecified: Secondary | ICD-10-CM | POA: Diagnosis not present

## 2020-03-20 DIAGNOSIS — Z930 Tracheostomy status: Secondary | ICD-10-CM | POA: Diagnosis not present

## 2020-03-20 DIAGNOSIS — I6602 Occlusion and stenosis of left middle cerebral artery: Secondary | ICD-10-CM | POA: Diagnosis not present

## 2020-03-20 LAB — GLUCOSE, CAPILLARY
Glucose-Capillary: 246 mg/dL — ABNORMAL HIGH (ref 70–99)
Glucose-Capillary: 284 mg/dL — ABNORMAL HIGH (ref 70–99)
Glucose-Capillary: 284 mg/dL — ABNORMAL HIGH (ref 70–99)
Glucose-Capillary: 285 mg/dL — ABNORMAL HIGH (ref 70–99)
Glucose-Capillary: 305 mg/dL — ABNORMAL HIGH (ref 70–99)
Glucose-Capillary: 351 mg/dL — ABNORMAL HIGH (ref 70–99)

## 2020-03-20 MED ORDER — SODIUM CHLORIDE 0.9 % IV SOLN
1.0000 g | INTRAVENOUS | Status: AC
  Administered 2020-03-20 – 2020-03-24 (×5): 1 g via INTRAVENOUS
  Filled 2020-03-20 (×5): qty 10

## 2020-03-20 MED ORDER — CHLORHEXIDINE GLUCONATE 0.12 % MT SOLN
OROMUCOSAL | Status: AC
  Filled 2020-03-20: qty 15

## 2020-03-20 MED ORDER — INSULIN ASPART 100 UNIT/ML ~~LOC~~ SOLN
8.0000 [IU] | SUBCUTANEOUS | Status: DC
  Administered 2020-03-20 – 2020-03-21 (×7): 8 [IU] via SUBCUTANEOUS

## 2020-03-20 MED ORDER — SODIUM CHLORIDE 0.9 % IV SOLN: INTRAVENOUS | Status: DC

## 2020-03-20 MED ORDER — INSULIN GLARGINE 100 UNIT/ML ~~LOC~~ SOLN
25.0000 [IU] | Freq: Two times a day (BID) | SUBCUTANEOUS | Status: DC
  Administered 2020-03-20 – 2020-03-22 (×5): 25 [IU] via SUBCUTANEOUS
  Filled 2020-03-20 (×6): qty 0.25

## 2020-03-20 NOTE — Progress Notes (Signed)
STROKE TEAM PROGRESS NOTE   INTERVAL HISTORY No family at bedside. Pt neuro stable, unchanged. On trach collar, no acute distress. On tube feeding. Still has hyperglycemia, will increase insulin dose.  Vitals:   03/20/20 0700 03/20/20 0753 03/20/20 0943 03/20/20 1057  BP: 105/62  122/64 117/67  Pulse: 87   70  Resp: 20   17  Temp: 99.1 F (37.3 C)   98.6 F (37 C)  TempSrc: Oral   Axillary  SpO2: 97% 95%  97%  Weight:      Height:       CBC:  Recent Labs  Lab 03/16/20 0528 03/19/20 0156  WBC 14.7* 17.4*  HGB 10.1* 11.2*  HCT 31.6* 35.7*  MCV 101.9* 100.8*  PLT 322 283   Basic Metabolic Panel:  Recent Labs  Lab 03/17/20 0426 03/19/20 0156  NA 143 138  K 3.8 4.6  CL 105 102  CO2 26 25  GLUCOSE 194* 268*  BUN 22* 24*  CREATININE 0.81 0.88  CALCIUM 9.0 9.2   IMAGING past 24 hours DG CHEST PORT 1 VIEW  Result Date: 03/19/2020 CLINICAL DATA:  Leukocytosis. EXAM: PORTABLE CHEST 1 VIEW COMPARISON:  March 13, 2020 FINDINGS: There is stable tracheostomy tube and nasogastric tube positioning. Mild bibasilar atelectasis is noted. There is no evidence of a pleural effusion or pneumothorax. The cardiac silhouette is mildly enlarged and unchanged in size. The visualized skeletal structures are unremarkable. IMPRESSION: Mild bibasilar atelectasis. Electronically Signed   By: Aram Candela M.D.   On: 03/19/2020 19:37     PHYSICAL EXAM       Temp:  [98 F (36.7 C)-99.7 F (37.6 C)] 98.6 F (37 C) (10/03 1057) Pulse Rate:  [70-90] 70 (10/03 1057) Resp:  [17-24] 17 (10/03 1057) BP: (105-144)/(62-75) 117/67 (10/03 1057) SpO2:  [94 %-100 %] 97 % (10/03 1057) FiO2 (%):  [28 %] 28 % (10/03 1415) Weight:  [93.7 kg] 93.7 kg (10/03 0621)  General - Well nourished, well developed, in no apparent distress, on trach collar.  Ophthalmologic - fundi not visualized due to noncooperation.  Cardiovascular - Regular rhythm and rate.  Neuro - awake alert, eyes open, on trach  collar, nonverbal, able to track bilaterally, no gaze deviation, not blinking to visual threat on the right.  Able to follow "open eyes" "close eyes" but then perseverated on that.  Slow response to peripheral commands on the left hand and foot, however not consistent.  Right facial droop, tongue protrusion not corporative.  Left upper extremity 5/5.  Left lower extremity 4/5.  Right upper extremity flaccid.  Right lower extremity mild withdraw to pain.  Right Babinski positive. Sensation, coordination not corporative and gait not tested.    ASSESSMENT/PLAN [name] is a 49 y.o. female with history of difficulty to control DB, HLD, syncope presenting to [facility] with R hemiparesis, facial droop, left gaze, and global aphasia w/ glucose 400s where she was found to have L M1 occlusion and transferred to [facility] for IR.   Stroke:   L MCA infarct s/p IR w/ post procedure hemorrhage, infarct secondary to large vessel disease source  CT head [facility]) L MCA infarct w/ ASPECTS 6  CTA head & neck [facility]) L M2 cutoff  CT head L MCA territory infarct. ASPECTS 6.   CT perfusion 63 core L brain same as CT. 32 penumbra. 1.5 mismatch ratio.  Cerebral angio / IR - L M1 occlusion s/p TICI2c revascularization w/  reocclusion d/t severe underlying stenosis. Post IR hemorrhage.  CT head 9/21 1818 increased L MCA ischemic infarct w/ mass effect and partial effacement L lateral ventrilc w/ 49mm midline shift. Small foci L BG HT same w/ increased L perisylvian and L cerebral convexity hemorrhage.   CT head 9/22 0151 evolution large L MCA infarct w/ slightly worse effacement and mass effect w/ 65mm midline shift. Scattered foci hemorrhage in infarct same w/ contrast clearing. L M1 and proximal L MCA branches w/ persistent hyperdensity.   CT head 9/22 0827  L MCA infarct and hemorrhages same. Increase in mass effect and effacement now 35mm midline shift.  L M1 and proximal L MCA braches w/ persistent hyperdensity.   MRI 9/22 Large L MCA infarct w/ moderate infarct and SAH. Edema, 23mm midline shift.   MRA 9/22 L MCA 1 occlusion   CT head 9/24 L MCA infarct w/ mild increase in edema, 5mm midline shift    CT repeat 9/26 - Stable extent of left MCA territory infarct with small volume blood products. Midline shift continues to measure 9 mm.  2D Echo EF 60-65%. No source of embolus   Recent Zio patch Normal w/ infrequent PACs, PVCs 01/07/2020  LDL 174   HgbA1c 10.7   VTE prophylaxis - Lovenox 40 mg sq daily    clopidogrel 75 mg daily prior to admission, now on No antithrombotic. Pt allergic to ASA, and hold off plavix now due to potential PEG tube placement  Therapy recommendations:  SNF   Disposition:  pending   Acute Respiratory Failure  Secondary to stroke  Intubated for IR, left intubated post IR    Off sedation  CCM on board   Trach placed 03/13/20 - on trach collar  CCM on board for trach management     Cerebral Edema  MRI 9/22 with MLS @ 61mm  CT head 9/24 L MCA infarct w/ mild increase in edema, 92mm midline shift    CT repeat 9/26 - Stable extent of left MCA territory infarct with small volume blood products. Midline shift continues to measure 9 mm.  On 3% protocol - @75 ->50->NS-> off  23.4% x 1 (9/25)  Na 138  Leukocytosis   afebrile  WBC 14.7->17.4  UA - WBC 11-20  CXR - bibasilar atx  Urine culture pending  On Rocephin 5 day course  Hyperlipidemia  Home meds:  lipitor 20  LDL 174, goal < 70  On lipitor 80  Continue statin at discharge  Diabetes type II Uncontrolled  Home meds:  Glimepiride , lantus 21, liraglutide 1.8  HgbA1c 10.7, goal < 7.0  CBGs  Hyperglycemia continues  SSI 0-15  On lantus 28 -> 35 -> 25 bid  novolog premeal 3U Q4 -> 5U Q4 -> 8U Q4  Dysphagia . Secondary to stroke . NPO except meds . on TF @ 45 via cortrak  . On IVF @ 25 . On FW flush 60cc  Q4 . Likely needs PEG - hold off plavix for now  . Speech on board  Tobacco abuse  Current smoker  Will provide smoking cessation when able    Urinary retention  Off Foley catheter after procedure  Needed I&O x4-5  Put back Foley catheter 9/25 -> off  On Urecholine  Other Stroke Risk Factors  UDS positive benzos and barb (done following IR procedure)   Obesity, Body mass index is 40.33 kg/m., recommend weight loss, diet and exercise as appropriate   Family hx stroke (paternal grandfather)  Migraines  on fioricet  Other Active Problems  Chronic pain, Anxiety  Palliative Care consult 03/12/20 - per pt's son - patient has stated she would not want life prolonged in vegetative state or with significant loss of function - would like to allow time for outcomes prior to making further decisions.  Hypokalemia 3.1 - supplemented - 3.8->4.6   Hospital day # 12  Marvel Plan, MD PhD Stroke Neurology 03/20/2020 6:59 PM   To contact Stroke Continuity provider, please refer to [email or web address]. After hours, contact General Neurology

## 2020-03-21 DIAGNOSIS — I63512 Cerebral infarction due to unspecified occlusion or stenosis of left middle cerebral artery: Secondary | ICD-10-CM | POA: Diagnosis not present

## 2020-03-21 LAB — CBC
HCT: 34.1 % — ABNORMAL LOW (ref 36.0–46.0)
Hemoglobin: 11 g/dL — ABNORMAL LOW (ref 12.0–15.0)
MCH: 32.4 pg (ref 26.0–34.0)
MCHC: 32.3 g/dL (ref 30.0–36.0)
MCV: 100.3 fL — ABNORMAL HIGH (ref 80.0–100.0)
Platelets: 446 10*3/uL — ABNORMAL HIGH (ref 150–400)
RBC: 3.4 MIL/uL — ABNORMAL LOW (ref 3.87–5.11)
RDW: 14.2 % (ref 11.5–15.5)
WBC: 18 10*3/uL — ABNORMAL HIGH (ref 4.0–10.5)
nRBC: 0 % (ref 0.0–0.2)

## 2020-03-21 LAB — BASIC METABOLIC PANEL
Anion gap: 13 (ref 5–15)
BUN: 24 mg/dL — ABNORMAL HIGH (ref 6–20)
CO2: 22 mmol/L (ref 22–32)
Calcium: 9.3 mg/dL (ref 8.9–10.3)
Chloride: 100 mmol/L (ref 98–111)
Creatinine, Ser: 0.87 mg/dL (ref 0.44–1.00)
GFR calc Af Amer: >60 mL/min (ref >60)
GFR calc non Af Amer: >60 mL/min (ref >60)
Glucose, Bld: 275 mg/dL — ABNORMAL HIGH (ref 70–99)
Potassium: 5.3 mmol/L — ABNORMAL HIGH (ref 3.5–5.1)
Sodium: 135 mmol/L (ref 135–145)

## 2020-03-21 LAB — GLUCOSE, CAPILLARY
Glucose-Capillary: 281 mg/dL — ABNORMAL HIGH (ref 70–99)
Glucose-Capillary: 282 mg/dL — ABNORMAL HIGH (ref 70–99)
Glucose-Capillary: 278 mg/dL — ABNORMAL HIGH (ref 70–99)
Glucose-Capillary: 357 mg/dL — ABNORMAL HIGH (ref 70–99)
Glucose-Capillary: 298 mg/dL — ABNORMAL HIGH (ref 70–99)

## 2020-03-21 MED ORDER — INSULIN ASPART 100 UNIT/ML ~~LOC~~ SOLN
16.0000 [IU] | SUBCUTANEOUS | Status: DC
  Administered 2020-03-21 – 2020-03-25 (×21): 16 [IU] via SUBCUTANEOUS

## 2020-03-21 NOTE — Progress Notes (Signed)
STROKE TEAM PROGRESS NOTE   INTERVAL HISTORY No family at bedside. Pt neuro stable, unchanged. On trach collar, no acute distress. On tube feeding.  Continue to have hyperglycemia, needing high dose of NovoLog per sliding scales.  Consult to diabetic coordinator for better management.  Vitals:   03/21/20 1504 03/21/20 1629 03/21/20 1632 03/21/20 1845  BP:  (!) 118/56 (!) 118/56   Pulse: 84 82 80 89  Resp:  16 15 13   Temp:   98.6 F (37 C)   TempSrc:   Oral   SpO2:  99% 100% 99%  Weight:      Height:       CBC:  Recent Labs  Lab 03/19/20 0156 03/21/20 0215  WBC 17.4* 18.0*  HGB 11.2* 11.0*  HCT 35.7* 34.1*  MCV 100.8* 100.3*  PLT 283 446*   Basic Metabolic Panel:  Recent Labs  Lab 03/19/20 0156 03/21/20 0215  NA 138 135  K 4.6 5.3*  CL 102 100  CO2 25 22  GLUCOSE 268* 275*  BUN 24* 24*  CREATININE 0.88 0.87  CALCIUM 9.2 9.3   IMAGING past 24 hours No results found.   PHYSICAL EXAM       Temp:  [98.2 F (36.8 C)-98.9 F (37.2 C)] 98.6 F (37 C) (10/04 1632) Pulse Rate:  [78-98] 89 (10/04 1845) Resp:  [13-23] 13 (10/04 1845) BP: (112-130)/(56-85) 118/56 (10/04 1632) SpO2:  [91 %-100 %] 99 % (10/04 1845) FiO2 (%):  [28 %] 28 % (10/04 1504) Weight:  [92.4 kg] 92.4 kg (10/04 0441)  General - Well nourished, well developed, in no apparent distress, on trach collar.  Ophthalmologic - fundi not visualized due to noncooperation.  Cardiovascular - Regular rhythm and rate.  Neuro - awake alert, eyes open, on trach collar, nonverbal, able to track bilaterally, no gaze deviation, not blinking to visual threat on the right.  Able to follow "open eyes" "close eyes" but then perseverated on that.  Slow response to peripheral commands on the left hand and foot, however not consistent.  Right facial droop, tongue protrusion not corporative.  Left upper extremity 5/5.  Left lower extremity 4/5.  Right upper extremity flaccid.  Right lower extremity mild withdraw to pain.   Right Babinski positive. Sensation, coordination not corporative and gait not tested.   ASSESSMENT/PLAN [name] is a 49 y.o. female with history of difficulty to control DB, HLD, syncope presenting to [facility] with R hemiparesis, facial droop, left gaze, and global aphasia w/ glucose 400s where she was found to have L M1 occlusion and transferred to [facility] for IR.   Stroke:   L MCA infarct s/p IR w/ post procedure hemorrhage, infarct secondary to large vessel disease source  CT head [facility]) L MCA infarct w/ ASPECTS 6  CTA head & neck [facility]) L M2 cutoff  CT head L MCA territory infarct. ASPECTS 6.   CT perfusion 63 core L brain same as CT. 32 penumbra. 1.5 mismatch ratio.  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 Post IR hemorrhage.  CT head 9/21 1818 increased L MCA ischemic infarct w/ mass effect and partial effacement L lateral ventrilc w/ 68mm midline shift. Small foci L BG HT same w/ increased L perisylvian and L cerebral convexity hemorrhage.   CT head 9/22 0151 evolution large L MCA infarct w/ slightly worse effacement and mass effect w/ 72mm midline shift. Scattered  foci hemorrhage in infarct same w/ contrast clearing. L M1 and proximal L MCA branches w/ persistent hyperdensity.   CT head 9/22 0827  L MCA infarct and hemorrhages same. Increase in mass effect and effacement now 57mm midline shift. L M1 and proximal L MCA braches w/ persistent hyperdensity.   MRI 9/22 Large L MCA infarct w/ moderate infarct and SAH. Edema, 73mm midline shift.   MRA 9/22 L MCA 1 occlusion   CT head 9/24 L MCA infarct w/ mild increase in edema, 24mm midline shift    CT repeat 9/26 - Stable extent of left MCA territory infarct with small volume blood products. Midline shift continues to measure 9 mm.  2D Echo EF 60-65%. No source of embolus   Recent Zio patch  Normal w/ infrequent PACs, PVCs 01/07/2020  LDL 174   HgbA1c 10.7   VTE prophylaxis - Lovenox 40 mg sq daily    clopidogrel 75 mg daily prior to admission, now on No antithrombotic. Pt allergic to ASA, and hold off plavix now due to potential PEG tube placement  Therapy recommendations:  SNF   Disposition:  pending   Acute Respiratory Failure  Secondary to stroke  Intubated for IR, left intubated post IR    Off sedation  CCM on board   Trach placed 03/13/20 - on trach collar  CCM on board for trach management     Cerebral Edema  MRI 9/22 with MLS @ 46mm  CT head 9/24 L MCA infarct w/ mild increase in edema, 47mm midline shift    CT repeat 9/26 - Stable extent of left MCA territory infarct with small volume blood products. Midline shift continues to measure 9 mm.  On 3% protocol - @75 ->50->NS-> off  23.4% x 1 (9/25)  Na 138  Leukocytosis   afebrile  WBC 14.7->17.4->18.0  UA - WBC 11-20  CXR - bibasilar atx  Urine culture pending  On Rocephin 5 day course  Hyperlipidemia  Home meds:  lipitor 20  LDL 174, goal < 70  On lipitor 80  Continue statin at discharge  Diabetes type II Uncontrolled  Home meds:  Glimepiride , lantus 21, liraglutide 1.8  HgbA1c 10.7, goal < 7.0  CBGs  Hyperglycemia continues  SSI 0-15  On lantus 28 -> 35 -> 25 bid  novolog premeal 3U Q4 -> 5U Q4 -> 8U Q4->16U Q4  Diabetic coordinator on board  Dysphagia . Secondary to stroke . NPO except meds . on TF @ 45 via cortrak  . On IVF @ 25 . On FW flush 60cc Q4 . Likely needs PEG - hold off plavix for now  . Speech on board  Tobacco abuse  Current smoker  Will provide smoking cessation when able    Other Stroke Risk Factors  UDS positive benzos and barb (done following IR procedure)   Obesity, Body mass index is 39.78 kg/m., recommend weight loss, diet and exercise as appropriate   Family hx stroke (paternal grandfather)  Migraines on fioricet  Other  Active Problems  Chronic pain, Anxiety  Hypokalemia 3.1 - supplemented - 3.8->4.6   Hospital day # 13  09-29-1970, MD PhD Stroke Neurology 03/21/2020 6:46 PM   To contact Stroke Continuity provider, please refer to 05/21/2020. After hours, contact General Neurology

## 2020-03-21 NOTE — Progress Notes (Signed)
Physical Therapy Treatment Patient Details Name: Deborah [name] MRN: [id number] DOB: 16-Sep-1970 Today's Date: 03/21/2020    History of Present Illness 49 y/o female with history of IDDM, HLD, tobacco use disorder presented to [facility] for facial droop, right sided weakness and global aphasia. Found to have LVO of left M1 segment and was transferred to [facility]. Pt underwent left common carotid arteriogram followed by revascularization of L MCA, however it reoccluded due to sever distal M1 stenosis.Pt underwent trach on 9/26.    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 Attempted to utilize mirror for pt to correct herself and find/maintain midline, without success. She tends to have her gaze follow faces of personnel and thus required someone to stand to her R visual field to coax her to look to the R to see herself in the mirror. Pushing in sitting decreased after pt was placed leaning laterally onto her L elbow for a short period of time while her feet were unsupported. She continued to push in standing utilizing the stedy though, despite multiple cues to correct. Pt did display muscle activation in her R big toe after providing physical assistance to flex and extend it, but unable to activate muscles on R side otherwise when cued. Will continue to follow up with pt to address her deficits in order to increase her independence and safety with functional mobility.   Follow Up Recommendations  SNF     Equipment Recommendations  Wheelchair (measurements PT);Wheelchair cushion (measurements PT);Hospital bed    Recommendations for Other Services       Precautions / Restrictions Precautions Precautions: Fall Precaution Comments: NG; trach Restrictions Weight Bearing Restrictions: No    Mobility  Bed Mobility Overal bed mobility: Needs Assistance Bed  Mobility: Supine to Sit     Supine to sit: Mod assist;HOB elevated     General bed mobility comments: HOB elevated during bed mob, requiring extra time and cues for pt to bring L LE to EOB and L hand on L bed rail. ModA to manage R UE and R LE due to poor-no muscle activation when cued. VC's to ascend trunk through pushing up on L elbow > hand with minA for trunk management.   Transfers Overall transfer level: Needs assistance Equipment used: Ambulation equipment used Transfers: Sit to/from Stand Sit to Stand: Min assist         General transfer comment: VC's and HOH placement of R hand on stedy grab bar and for LE placement. VC's provided to pt to pull to come to stand, x1 rep from EOB and x3 reps from stedy. Once standing statically in stedy, pt required modA on the R to maintain balance due to pt pushing laterally to R despite use of mirror and max cues to correct and lean to L instead.  Ambulation/Gait                 Stairs             Wheelchair Mobility    Modified Rankin (Stroke Patients Only) Modified Rankin (Stroke Patients Only) Pre-Morbid Rankin Score: No symptoms Modified Rankin: Severe disability     Balance Overall balance assessment: Needs assistance Sitting-balance support: Bilateral upper extremity supported;Feet supported (use of stedy) Sitting balance-Leahy Scale: Poor Sitting balance - Comments: Requires min-modA to maintain static sitting balance with use of  B UEs placed on bed for support. Pt pushes to R, thus lifted bed so feet were unsupported and cued pt to look in mirror to find midline, with min-no success as pt unable to follow directions to look at self. VCs and TCs provided to lean laterally to L on L elbow to dec pushing syndrome, with success noted once returned to upright position. Postural control: Right lateral lean Standing balance support: Bilateral upper extremity supported Standing balance-Leahy Scale: Poor Standing balance  comment: Requires assist of 2 for standing statically in stedy with modA on R and min guard-minA on L, cuing pt to find and maintain midline through use of mirror anterior to pt and through cues to reach with L UE laterally or lean L shoulder laterally into therapist hand, with no success as pt unable to follow directions. Pt able to stand for up to ~20-60 seconds each bout.                            Cognition Arousal/Alertness: Awake/alert Behavior During Therapy: Flat affect Overall Cognitive Status: Impaired/Different from baseline Area of Impairment: Attention;Following commands;Safety/judgement;Awareness                   Current Attention Level: Selective (Gaze follows faces but required continuous cues for mirror)   Following Commands: Follows one step commands inconsistently Safety/Judgement: Decreased awareness of safety;Decreased awareness of deficits (Pushes laterally to R (weak side), despite cues to correct)   Problem Solving: Slow processing;Decreased initiation;Difficulty sequencing;Requires verbal cues;Requires tactile cues General Comments: Follows single step commands ~25% of time with repetition. Her gaze follows faces of personnel, but when cued to look at herself in the mirror she had difficulty following directions unless personnel stood to her R viual side to encourage R gaze. Pt did not follow directions to give thumbs up or down in response to question or to avoid pushing to one side.      Exercises      General Comments        Pertinent Vitals/Pain Pain Assessment: Faces Faces Pain Scale: Hurts a little bit (when attempting to cough) Pain Location: generalized response to noxious stimuli Pain Descriptors / Indicators: Grimacing Pain Intervention(s): Monitored during session;Repositioned;Limited activity within patient's tolerance    Home Living                      Prior Function            PT Goals (current goals can now  be found in the care plan section) Acute Rehab PT Goals Patient Stated Goal: pt unable to state PT Goal Formulation: Patient unable to participate in goal setting Time For Goal Achievement: 03/26/20 Potential to Achieve Goals: Fair Progress towards PT goals: Progressing toward goals    Frequency    Min 3X/week      PT Plan Current plan remains appropriate    Co-evaluation     PT goals addressed during session: Balance;Proper use of DME;Mobility/safety with mobility        AM-PAC PT "6 Clicks" Mobility   Outcome Measure  Help needed turning from your back to your side while in a flat bed without using bedrails?: A Lot Help needed moving from lying on your back to sitting on the side of a flat bed without using bedrails?: A Lot Help needed moving to and from a bed to a chair (including a wheelchair)?: Total Help needed standing up  from a chair using your arms (e.g., wheelchair or bedside chair)?: A Lot Help needed to walk in hospital room?: Total Help needed climbing 3-5 steps with a railing? : Total 6 Click Score: 9    End of Session Equipment Utilized During Treatment: Gait belt;Oxygen (trach) Activity Tolerance: Patient tolerated treatment well Patient left: in chair;with call bell/phone within reach Nurse Communication: Mobility status;Need for lift equipment (to return pt back to bed in ~30 min if necessary/appropriate) PT Visit Diagnosis: Unsteadiness on feet (R26.81);Other abnormalities of gait and mobility (R26.89);Muscle weakness (generalized) (M62.81);Difficulty in walking, not elsewhere classified (R26.2);Other symptoms and signs involving the nervous system (R29.898);Hemiplegia and hemiparesis Hemiplegia - Right/Left: Right Hemiplegia - caused by: Cerebral infarction     Time: 1127-1213 PT Time Calculation (min) (ACUTE ONLY): 46 min  Charges:  $Therapeutic Activity: 8-22 mins $Neuromuscular Re-education: 23-37 mins                     Raymond Gurney, PT,  DPT Acute Rehabilitation Services  Pager: [phone] Office: [phone]      Jewel Baize 03/21/2020, 2:06 PM

## 2020-03-21 NOTE — Progress Notes (Addendum)
NAME:  Deborah [name], MRN:  [id number], DOB:  1970/09/26, LOS: 13 ADMISSION DATE:  03/08/2020, CONSULTATION DATE:  03/08/20 REFERRING MD:  Neurology, CHIEF COMPLAINT:  CVA    Brief History   49 y/o female with history of IDDM, HLD, tobacco use disorder presented to [facility] for facial droop, right sided weakness and global aphasia. Found to have LVO of left M1 segment and was transferred to [facility] for attempt at endovascular revascularization.   History of present illness   49 y/o female presented after being found by her husband this morning with facial droop, right sided weakness, and global aphasia. LKN was 8:00 pm the night before. NIH score of 21 on arrival. She was immediately taken to IR and intubated for attempt at endovascular revascularization. Unfortunately, she had immediate reocclusion after achieving TICI 2c revascularization. Rescue stent not performed due to increased risk of ICH. She was transferred to the ICU for continued monitoring post-procedure.   Past Medical History  IDDM HLD Tobacco use Migraine headaches Chronic pain  Anxiety   Significant Hospital Events   9/21 > intubated for IR procedure 9/21> unsuccessful revascularization 9/21> admitted to neuro ICU 9/24: pt remains on vent on light sedation awake but able to utilize extremities to follow commands. Does hold eyes shut on command. Unable to perform tongue thrust. Pt and family would benefit from palliative consult, d/w Dr Roda Shutters (he will speak with pt's boyfriend who son is deferring decisions to). 3% stopped overnight 2/2 Na >155. Neurosx signing off despite some progressive shift on imaging. 9/25: Planning for tracheostomy today-this is rescheduled 9/26: No overnight events  Consults:  PCCM Neurosurgery   Procedures:  9/21>cerebral arteriogram with emergent mechanical thrombectomy  9/26 trach>  Significant Diagnostic Tests:  9/21 CT head > Acute left MCA territory infarct without hemorrhagic  conversion or progression from CT earlier today. Aspects remains 6  9/22 at 1 am CT head >Continued interval evolution of large left MCA territory infarct, overall similar in size and distribution from previous. Associated regional mass effect with partial effacement of the left lateral ventricle has mildly worsened, with worsened left-to-right midline shift now measuring up to 5 mm 9/22 at 7:50 am CT head > Unchanged extent of a large acute/early subacute left MCA vascular territory infarct and associated hemorrhage as compared to the head CT performed earlier the same day at 12:57 a.m. Continued slight interval increase in mass effect with partial effacement of the left lateral ventricle and now 6 mm rightward midline shift (previously 5 mm). The basal cisterns remain patent. No evidence of ventricular entrapment. 9/22 MRI brain > Large territory infarct left MCA territory with moderate hemorrhage and mild subarachnoid hemorrhage. There is edema and mass-effect with 7 mm midline shift to the right. Left MCA is occluded in the M1 segment distal to an early temporal branch. No other intracranial stenosis 9/24 cth: Acute left MCA territory infarct with mildly progressive swelling (9 mm midline shift) and regressed hemorrhage.   Micro Data:  N/A  Antimicrobials:  N/A   Interim history/subjective:  Has been on trach collar since 1400 03/15/20.  FiO2 28% flow 5lpm  No acute issues.  Objective   Blood pressure 128/73, pulse 80, temperature 98.2 F (36.8 C), temperature source Oral, resp. rate 20, height 5' (1.524 m), weight 92.4 kg, SpO2 98 %.    FiO2 (%):  [28 %] 28 %   Intake/Output Summary (Last 24 hours) at 03/21/2020 1029 Last data filed at 03/21/2020 0409 Gross per 24  hour  Intake 435.16 ml  Output 1166 ml  Net -730.84 ml   Filed Weights   03/19/20 0318 03/20/20 0621 03/21/20 0441  Weight: 95.2 kg 93.7 kg 92.4 kg    Examination:  General: WDWN middle aged F supine in bed  NAD on trach collar, sister at bedside HENT: NCAT pink mmm, scant clear oral secretions. Trach C/D/I Lungs: CTAB Cardiovascular: RRR, No M/R/G Abdomen:  Soft round ndnt + bowel sounds  Extremities: LUE PICC. Non-pitting dependent extremity edema  Neuro: Eyes open, tracks appropriately. Moves L side spontaneously, follows intermittent commands, mimics gestures   Assessment & Plan:   Left MCA stroke, unsuccessful revascularization -Neuro status remains unchanged-- CT H with stable L MCA infarct + stable 30mm L to R midline shift  Cerebral edema  P -per neuro   Acute respiratory failure secondary to inadequate airway protection -s/p tracheostomy 9/26 P -Continue trach collar -routine trach care, pulm hygiene  -SLP following  Inadequate PO intake -in setting of CVA above, dysphagia, tracheostomy status P -currently EN per RDN via cortak, PEG pending   Insulin-dependent diabetes with severe hyperglycemia -Continue SSI, lantus  PCCM will see weekly for trach needs.  Please call if needed sooner.  Best practice:  Diet: Tube feeds, PEG pending Pain/Anxiety/Delirium protocol (if indicated): off VAP protocol (if indicated): Y DVT prophylaxis: lovenox  GI prophylaxis: protonix  Glucose control: SSI, Lantus Mobility: Bedrest Code Status: FULL Family Communication: Sister updated at bedside Disposition: Progressive   Rutherford Guys, [place] Sidonie Dickens Pulmonary & Critical Care Medicine 03/21/2020, 10:33 AM

## 2020-03-21 NOTE — Progress Notes (Signed)
Trach sutures were removed without complications. °

## 2020-03-21 NOTE — Progress Notes (Signed)
Inpatient Diabetes Program Recommendations  AACE/ADA: New Consensus Statement on Inpatient Glycemic Control (2015)  Target Ranges:  Prepandial:   less than 140 mg/dL      Peak postprandial:   less than 180 mg/dL (1-2 hours)      Critically ill patients:  140 - 180 mg/dL   Results for Deborah [name], Deborah [name] (MRN [id number]) as of 03/21/2020 08:14  Ref. Range 03/19/2020 23:53 03/20/2020 03:36 03/20/2020 07:48 03/20/2020 11:18 03/20/2020 15:02 03/20/2020 19:37  Glucose-Capillary Latest Ref Range: 70 - 99 mg/dL 035 (H)  13 units NOVOLOG  284 (H)  13 units NOVOLOG  284 (H)  13 units NOVOLOG  351 (H)  20 units NOVOLOG  246 (H)  13 units NOVOLOG +  25 units LANTUS @2 :51pm  285 (H)  16 units NOVOLOG +  25 units LANTUS @10 :59pm    Results for [name], [name] (MRN ) as of 03/21/2020 08:14  Ref. Range 03/20/2020 23:49 03/21/2020 04:08 03/21/2020 07:51  Glucose-Capillary Latest Ref Range: 70 - 99 mg/dL 05/21/2020 (H)  19 units NOVOLOG  281 (H)  16 units NOVOLOG  282 (H)  16 units NOVOLOG     Home DM Meds: Lantus 28 units Daily       Victoza 1.8 mg Daily       Amaryl 2 mg Daily  Current Orders: Lantus 25 units BID      Novolog Moderate Correction Scale/ SSI (0-15 units) Q4 hours      Novolog 8 units Q4 hours    Tube Feeds running 45cc/hr  Note Lantus and Novolog tube feed coverage both increased yesterday.  CBGs remain >250 since Midnight.  MD- Please consider increasing the Novolog Tube Feed Coverage to 16 units Q4 hours (double current dose)--Has been requiring large doses of Novolog per chart review     --Will follow patient during hospitalization--  05/21/2020 RN, MSN, CDE Diabetes Coordinator Inpatient Glycemic Control Team Team Pager: [phone] (8a-5p)

## 2020-03-22 DIAGNOSIS — I6602 Occlusion and stenosis of left middle cerebral artery: Secondary | ICD-10-CM | POA: Diagnosis not present

## 2020-03-22 DIAGNOSIS — I63512 Cerebral infarction due to unspecified occlusion or stenosis of left middle cerebral artery: Secondary | ICD-10-CM | POA: Diagnosis not present

## 2020-03-22 LAB — CBC
HCT: 33.3 % — ABNORMAL LOW (ref 36.0–46.0)
Hemoglobin: 10.6 g/dL — ABNORMAL LOW (ref 12.0–15.0)
MCH: 32.7 pg (ref 26.0–34.0)
MCHC: 31.8 g/dL (ref 30.0–36.0)
MCV: 102.8 fL — ABNORMAL HIGH (ref 80.0–100.0)
Platelets: 358 10*3/uL (ref 150–400)
RBC: 3.24 MIL/uL — ABNORMAL LOW (ref 3.87–5.11)
RDW: 14 % (ref 11.5–15.5)
WBC: 16.8 10*3/uL — ABNORMAL HIGH (ref 4.0–10.5)
nRBC: 0 % (ref 0.0–0.2)

## 2020-03-22 LAB — BASIC METABOLIC PANEL
Anion gap: 15 (ref 5–15)
BUN: 25 mg/dL — ABNORMAL HIGH (ref 6–20)
CO2: 19 mmol/L — ABNORMAL LOW (ref 22–32)
Calcium: 9 mg/dL (ref 8.9–10.3)
Chloride: 97 mmol/L — ABNORMAL LOW (ref 98–111)
Creatinine, Ser: 0.78 mg/dL (ref 0.44–1.00)
GFR calc Af Amer: >60 mL/min (ref >60)
GFR calc non Af Amer: >60 mL/min (ref >60)
Glucose, Bld: 306 mg/dL — ABNORMAL HIGH (ref 70–99)
Potassium: 4.5 mmol/L (ref 3.5–5.1)
Sodium: 131 mmol/L — ABNORMAL LOW (ref 135–145)

## 2020-03-22 LAB — URINE CULTURE: Culture: <10000 — AB

## 2020-03-22 LAB — GLUCOSE, CAPILLARY
Glucose-Capillary: 211 mg/dL — ABNORMAL HIGH (ref 70–99)
Glucose-Capillary: 212 mg/dL — ABNORMAL HIGH (ref 70–99)
Glucose-Capillary: 245 mg/dL — ABNORMAL HIGH (ref 70–99)
Glucose-Capillary: 257 mg/dL — ABNORMAL HIGH (ref 70–99)
Glucose-Capillary: 263 mg/dL — ABNORMAL HIGH (ref 70–99)
Glucose-Capillary: 302 mg/dL — ABNORMAL HIGH (ref 70–99)
Glucose-Capillary: 325 mg/dL — ABNORMAL HIGH (ref 70–99)

## 2020-03-22 MED ORDER — INSULIN GLARGINE 100 UNIT/ML ~~LOC~~ SOLN
30.0000 [IU] | Freq: Two times a day (BID) | SUBCUTANEOUS | Status: DC
  Administered 2020-03-22 – 2020-03-25 (×6): 30 [IU] via SUBCUTANEOUS
  Filled 2020-03-22 (×7): qty 0.3

## 2020-03-22 MED ORDER — GLUCERNA 1.2 CAL PO LIQD
1000.0000 mL | ORAL | Status: DC
  Administered 2020-03-22 – 2020-03-23 (×2): 1000 mL
  Filled 2020-03-22 (×3): qty 1000

## 2020-03-22 NOTE — Progress Notes (Signed)
STROKE TEAM PROGRESS NOTE   INTERVAL HISTORY RN is at bedside. Pt neuro stable, unchanged. On trach collar, no acute distress, still has secretions needing intermittent suctioning. On tube feeding.  Continue to have hyperglycemia, insulin increased, will also change Jevity to Glucerna.  Vitals:   03/22/20 0743 03/22/20 1133 03/22/20 1147 03/22/20 1621  BP: 127/62 122/62    Pulse: 81 81 83 85  Resp: 19  18 18   Temp: 98.8 F (37.1 C) 98.8 F (37.1 C)    TempSrc: Oral Oral    SpO2: 99% 98%  97%  Weight:      Height:       CBC:  Recent Labs  Lab 03/21/20 0215 03/22/20 0123  WBC 18.0* 16.8*  HGB 11.0* 10.6*  HCT 34.1* 33.3*  MCV 100.3* 102.8*  PLT 446* 358   Basic Metabolic Panel:  Recent Labs  Lab 03/21/20 0215 03/22/20 0123  NA 135 131*  K 5.3* 4.5  CL 100 97*  CO2 22 19*  GLUCOSE 275* 306*  BUN 24* 25*  CREATININE 0.87 0.78  CALCIUM 9.3 9.0   IMAGING past 24 hours No results found.   PHYSICAL EXAM       Temp:  [98.7 F (37.1 C)-98.9 F (37.2 C)] 98.8 F (37.1 C) (10/05 1133) Pulse Rate:  [75-92] 85 (10/05 1621) Resp:  [13-21] 18 (10/05 1621) BP: (111-127)/(60-64) 122/62 (10/05 1133) SpO2:  [97 %-99 %] 97 % (10/05 1621) FiO2 (%):  [28 %] 28 % (10/05 1621) Weight:  [93.1 kg] 93.1 kg (10/05 0301)  General - Well nourished, well developed, in no apparent distress, on trach collar.  Ophthalmologic - fundi not visualized due to noncooperation.  Cardiovascular - Regular rhythm and rate.  Neuro - awake alert, eyes open, on trach collar, nonverbal, able to track bilaterally, no gaze deviation, not blinking to visual threat on the right.  Able to follow "open eyes" "close eyes" but then perseverated on that.  Slow response to peripheral commands on the left hand and foot, however not consistent.  Right facial droop, tongue protrusion not corporative.  Left upper extremity 5/5.  Left lower extremity 4/5.  Right upper extremity flaccid.  Right lower extremity mild  withdraw to pain.  Right Babinski positive. Sensation, coordination not corporative and gait not tested.   ASSESSMENT/PLAN Deborah [name] is a 49 y.o. female with history of difficulty to control DB, HLD, syncope presenting to [facility] with R hemiparesis, facial droop, left gaze, and global aphasia w/ glucose 400s where she was found to have L M1 occlusion and transferred to [facility] for IR.   Stroke:   L MCA infarct s/p IR w/ post procedure hemorrhage, infarct secondary to large vessel disease source  CT head [facility]) L MCA infarct w/ ASPECTS 6  CTA head & neck [facility]) L M2 cutoff  CT head L MCA territory infarct. ASPECTS 6.   CT perfusion 63 core L brain same as CT. 32 penumbra. 1.5 mismatch ratio.  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 Post IR hemorrhage.  CT head 9/21 1818 increased L MCA ischemic infarct w/ mass effect and partial effacement L lateral ventrilc w/ 83mm midline shift. Small foci L BG HT same w/ increased L perisylvian and L cerebral convexity hemorrhage.   CT head 9/22 0151 evolution large L MCA infarct w/ slightly worse effacement and mass effect w/ 64mm midline shift. Scattered foci  hemorrhage in infarct same w/ contrast clearing. L M1 and proximal L MCA branches w/ persistent hyperdensity.   CT head 9/22 0827  L MCA infarct and hemorrhages same. Increase in mass effect and effacement now 88mm midline shift. L M1 and proximal L MCA braches w/ persistent hyperdensity.   MRI 9/22 Large L MCA infarct w/ moderate infarct and SAH. Edema, 69mm midline shift.   MRA 9/22 L MCA 1 occlusion   CT head 9/24 L MCA infarct w/ mild increase in edema, 72mm midline shift    CT repeat 9/26 - Stable extent of left MCA territory infarct with small volume blood products. Midline shift continues to measure 9 mm.  2D Echo EF 60-65%. No source of embolus    Recent Zio patch Normal w/ infrequent PACs, PVCs 01/07/2020  LDL 174   HgbA1c 10.7   VTE prophylaxis - Lovenox 40 mg sq daily    clopidogrel 75 mg daily prior to admission, now on No antithrombotic. Pt allergic to ASA, and hold off plavix now due to potential PEG tube placement  Therapy recommendations:  SNF   Disposition:  pending   Acute Respiratory Failure  Secondary to stroke  Intubated for IR, left intubated post IR    Off sedation  CCM on board   Trach placed 03/13/20 - on trach collar  CCM on board for trach management     Cerebral Edema  MRI 9/22 with MLS @ 35mm  CT head 9/24 L MCA infarct w/ mild increase in edema, 50mm midline shift    CT repeat 9/26 - Stable extent of left MCA territory infarct with small volume blood products. Midline shift continues to measure 9 mm.  On 3% protocol - @75 ->50->NS-> off  23.4% x 1 (9/25)  Na 138  Leukocytosis   afebrile  WBC 14.7->17.4->18.0-> 16.8  UA - WBC 11-20  CXR - bibasilar atx  Urine culture insignificant growth  On Rocephin 5 day course  Hyperlipidemia  Home meds:  lipitor 20  LDL 174, goal < 70  On lipitor 80  Continue statin at discharge  Diabetes type II Uncontrolled  Home meds:  Glimepiride , lantus 21, liraglutide 1.8  HgbA1c 10.7, goal < 7.0  CBGs  Hyperglycemia continues  SSI 0-15  On lantus 28 -> 35 -> 25 bid ->30 bid  novolog premeal 3U Q4 -> 5U Q4 -> 8U Q4->16U Q4  Diabetic coordinator on board  Change diet formula from Jevity to Glucerna  Dysphagia . Secondary to stroke . NPO except meds . on TF @ 47 via cortrak  . On IVF @ 27 . On FW flush 60cc Q4 . Likely needs PEG - hold off plavix for now  . Speech on board  Tobacco abuse  Current smoker  Will provide smoking cessation when able    Other Stroke Risk Factors  UDS positive benzos and barb (done following IR procedure)   Obesity, Body mass index is 40.08 kg/m., recommend weight loss, diet and  exercise as appropriate   Family hx stroke (paternal grandfather)  Migraines on fioricet  Other Active Problems  Chronic pain, Anxiety  Hypokalemia 3.1 - supplemented - 3.8->4.6  Hyponatremia sodium 135->131   Hospital day # 14  09-29-1970, MD PhD Stroke Neurology 03/22/2020 6:39 PM   To contact Stroke Continuity provider, please refer to 05/22/2020. After hours, contact General Neurology

## 2020-03-22 NOTE — Plan of Care (Signed)
  Problem: Health Behavior/Discharge Planning: Goal: Ability to manage health-related needs will improve Outcome: Progressing   Problem: Clinical Measurements: Goal: Ability to maintain clinical measurements within normal limits will improve Outcome: Progressing Goal: Will remain free from infection Outcome: Progressing Goal: Diagnostic test results will improve Outcome: Progressing Goal: Respiratory complications will improve Outcome: Progressing Goal: Cardiovascular complication will be avoided Outcome: Progressing   Problem: Clinical Measurements: Goal: Ability to maintain clinical measurements within normal limits will improve Outcome: Progressing   Problem: Clinical Measurements: Goal: Will remain free from infection Outcome: Progressing   Problem: Clinical Measurements: Goal: Diagnostic test results will improve Outcome: Progressing   Problem: Clinical Measurements: Goal: Respiratory complications will improve Outcome: Progressing

## 2020-03-22 NOTE — Progress Notes (Signed)
Inpatient Diabetes Program Recommendations  AACE/ADA: New Consensus Statement on Inpatient Glycemic Control (2015)  Target Ranges:  Prepandial:   less than 140 mg/dL      Peak postprandial:   less than 180 mg/dL (1-2 hours)      Critically ill patients:  140 - 180 mg/dL   Lab Results  Component Value Date   GLUCAP 263 (H) 03/22/2020   HGBA1C 10.7 (H) 03/09/2020    Review of Glycemic Control Results for [facility], [place] (MRN [id number]) as of 03/22/2020 09:20  Ref. Range 03/21/2020 19:34 03/21/2020 23:52 03/22/2020 02:58 03/22/2020 07:44  Glucose-Capillary Latest Ref Range: 70 - 99 mg/dL 841 (H) 660 (H) 630 (H) 263 (H)   Home DM Meds: Lantus 28 units Daily                             Victoza 1.8 mg Daily                             Amaryl 2 mg Daily  Current Orders: Lantus 25 units BID                            Novolog Moderate Correction Scale/ SSI (0-15 units) Q4 hours                            Novolog 16 units Q4 hours    Tube Feeds running 45cc/hr  CBGs remain >250 since Midnight.  MD- Please consider increasing Lantus 30 units BID.   Thanks, Lujean Rave, MSN, RNC-OB Diabetes Coordinator [phone] (8a-5p)

## 2020-03-22 NOTE — Progress Notes (Signed)
  Speech Language Pathology Treatment: Cognitive-Linquistic  Patient Details Name: Deborah [name] MRN: [id number] DOB: Nov 28, 1970 Today's Date: 03/22/2020 Time: 8315-1761 SLP Time Calculation (min) (ACUTE ONLY): 15 min  Assessment / Plan / Recommendation Clinical Impression  Pt has less audible secretions today, but still cannot tolerate PMV for more than a few respiratory cycles at a time before there is a significant burst of air, indicative of back pressure and decreased upper airway patency. Continue to recommend change to a cuffless, smaller trach to facilitate use.   Pt followed a few simple one-step commands today (~25% of the time) when given visual and tactile cues. Suspect a motor planning component at times in addition to language impairment. She moved her articulators, trying to approximate them x2. She did not appear to initiate any phonation even when PMV was briefly donned. Her son was present today with some education provided about current level of function, although reinforcement would be beneficial.    HPI HPI: 49 y/o female with history of IDDM, HLD, tobacco use disorder presented to [facility] for facial droop, right sided weakness and global aphasia. Found to have LVO of left M1 segment and was transferred to [facility]. Pt underwent left common carotid arteriogram followed by revascularization of L MCA, however it reoccluded due to sever distal M1 stenosis.Pt underwent trach on 9/26.      SLP Plan  Continue with current plan of care       Recommendations         Patient may use Passy-Muir Speech Valve: with SLP only MD: Please consider changing trach tube to : Smaller size;Cuffless         Oral Care Recommendations: Oral care QID Follow up Recommendations: Skilled Nursing facility SLP Visit Diagnosis: Aphasia (R47.01) Plan: Continue with current plan of care       GO                Mahala Menghini., M.A. CCC-SLP Acute Rehabilitation Services Pager  [phone] Office [phone]  03/22/2020, 4:51 PM

## 2020-03-22 NOTE — Progress Notes (Signed)
Physical Therapy Treatment Patient Details Name: Deborah [name] MRN: [id number] DOB: 1970-09-10 Today's Date: 03/22/2020    History of Present Illness 49 y/o female with history of IDDM, HLD, tobacco use disorder presented to [facility] for facial droop, right sided weakness and global aphasia. Found to have LVO of left M1 segment and was transferred to [facility] Pt underwent left common carotid arteriogram followed by revascularization of L MCA, however it reoccluded due to sever distal M1 stenosis.Pt underwent trach on 9/26.    PT Comments    Pt demonstrates progress towards her goals through no longer demonstrating pushing in sitting and thereby being able to sit statically EOB with increased independence of min guard. Attempted rhythmic initiation of R LE through AAROM with rolling back and forth to L with min-no active participation from pt as she would attempt repetitively to sit up rather than remain in sidelying. During stand pivot to R, pt's R LE required assistance for safety as she was unable to actively change its position. She required extensive assistance for the transfer between surfaces and to subsequently scoot posteriorly in her chair. Her SpO2 levels remained >/= 95% during the session while on room air. Trach collar was donned at end of session, with nurse notified. SNF appears to still be an appropriate recommendation for this pt upon d/c secondary to her significant change in function compared to baseline and her ability to participate at this time. Pt will continue to benefit from acute PT services to maximize her independence and safety with functional mobility.   Follow Up Recommendations  SNF     Equipment Recommendations  Wheelchair (measurements PT);Wheelchair cushion (measurements PT);Hospital bed    Recommendations for Other Services       Precautions / Restrictions Precautions Precautions: Fall Precaution Comments: NG; trach (28% FiO2 5L on arrival; removed  trach with sats >/= 95%) Restrictions Weight Bearing Restrictions: No    Mobility  Bed Mobility Overal bed mobility: Needs Assistance Bed Mobility: Rolling;Sidelying to Sit Rolling: Max assist (HOB elevated 30 degrees) Sidelying to sit: Min assist;HOB elevated (30 degrees)       General bed mobility comments: HOB elevated 30 degrees. VC's provided to push trhough R LE to roll to L. Attempted rhythmic initiation through rolling but pt anxious to sit up and thus did not follow cues. Required assistance for LEs off EOB with minA to ascend trunk.  Transfers Overall transfer level: Needs assistance   Transfers: Sit to/from Stand;Stand Pivot Transfers Sit to Stand: Min assist Stand pivot transfers: Mod assist;Max assist       General transfer comment: Asisstance for R foot and hand placement during transfers, cuing pt to lean anteriorly to come to stand. During stand pivot EOB > bedside chair towards R, pt's R foot crossed behind her L and required assistance to manage it safely, modAx1 with maxAx1 to complete transfer.   Ambulation/Gait                 Stairs             Wheelchair Mobility    Modified Rankin (Stroke Patients Only) Modified Rankin (Stroke Patients Only) Pre-Morbid Rankin Score: No symptoms Modified Rankin: Severe disability     Balance Overall balance assessment: Needs assistance Sitting-balance support: Bilateral upper extremity supported;Feet supported;Feet unsupported (no pushing noted in sitting this date)   Sitting balance - Comments: Min guard for static sitting EOB with UE support, no pushing noted with good maintenance of midline.   Standing balance  support: No upper extremity supported Standing balance-Leahy Scale: Poor Standing balance comment: Requires +2 person assist at trunk to maintain static standing balance, with pt losing balance and requiring modAx1 and maxAx1 to maintain balance with dynamic standing.                             Cognition Arousal/Alertness: Awake/alert Behavior During Therapy: Flat affect Overall Cognitive Status: Impaired/Different from baseline Area of Impairment: Attention;Following commands;Safety/judgement;Awareness                   Current Attention Level: Selective (successful cues intermittently to look a direction)   Following Commands: Follows one step commands inconsistently Safety/Judgement: Decreased awareness of safety;Decreased awareness of deficits   Problem Solving: Slow processing;Decreased initiation;Difficulty sequencing;Requires verbal cues;Requires tactile cues General Comments: Follows single step commands ~25% of time with increased time.      Exercises      General Comments        Pertinent Vitals/Pain Pain Assessment: Faces Faces Pain Scale: Hurts a little bit Pain Location: generalized response to noxious stimuli Pain Descriptors / Indicators: Grimacing Pain Intervention(s): Monitored during session;Repositioned;Limited activity within patient's tolerance    Home Living                      Prior Function            PT Goals (current goals can now be found in the care plan section) Acute Rehab PT Goals Patient Stated Goal: pt unable to state PT Goal Formulation: Patient unable to participate in goal setting Time For Goal Achievement: 03/26/20 Potential to Achieve Goals: Fair Progress towards PT goals: Progressing toward goals    Frequency    Min 3X/week      PT Plan Current plan remains appropriate    Co-evaluation     PT goals addressed during session: Strengthening/ROM;Mobility/safety with mobility        AM-PAC PT "6 Clicks" Mobility   Outcome Measure  Help needed turning from your back to your side while in a flat bed without using bedrails?: A Lot Help needed moving from lying on your back to sitting on the side of a flat bed without using bedrails?: A Lot Help needed moving to and from a bed  to a chair (including a wheelchair)?: A Lot Help needed standing up from a chair using your arms (e.g., wheelchair or bedside chair)?: A Little Help needed to walk in hospital room?: Total Help needed climbing 3-5 steps with a railing? : Total 6 Click Score: 11    End of Session Equipment Utilized During Treatment: Gait belt;Oxygen (trach collar removed during, sats >/= 95% SpO2; on @ end) Activity Tolerance: Patient tolerated treatment well Patient left: in chair;with call bell/phone within reach;with chair alarm set Nurse Communication: Mobility status (vitals status without trach collar) PT Visit Diagnosis: Unsteadiness on feet (R26.81);Muscle weakness (generalized) (M62.81);Difficulty in walking, not elsewhere classified (R26.2);Other symptoms and signs involving the nervous system (R29.898);Hemiplegia and hemiparesis Hemiplegia - Right/Left: Right Hemiplegia - caused by: Cerebral infarction     Time: 2841-3244 PT Time Calculation (min) (ACUTE ONLY): 23 min  Charges:  $Therapeutic Activity: 8-22 mins $Neuromuscular Re-education: 8-22 mins                     Raymond Gurney, PT, DPT Acute Rehabilitation Services  Pager: [phone] Office: [phone]    Deborah [name] 03/22/2020, 11:38 AM

## 2020-03-22 NOTE — Progress Notes (Signed)
Occupational Therapy Treatment Patient Details Name: Deborah [name] MRN: [id number] DOB: 04/21/1971 Today's Date: 03/22/2020   History of present illness 49 y/o female with history of IDDM, HLD, tobacco use disorder presented to [facility] for facial droop, right sided weakness and global aphasia. Found to have LVO of left M1 segment and was transferred to [facility]. Pt underwent left common carotid arteriogram followed by revascularization of L MCA, however it reoccluded due to sever distal M1 stenosis.Pt underwent trach on 9/26.   OT comments  Pt appears generally agreeable to session, no noted pain with PROM/positioning of RUE at bed level, requiring max +2 for rolling and supine scooting requiring increased A with line management. Increased secretions noted with change in positioning. Noted through session pt with slowed processing inconsistently attempting to follow commands with <10% 1 step with only verbal directions given. Good effort for washing face but perseverating on eyes, and with decreased attention to R. Deferred transfer OOB d/t need for +2 for safety. Pt to continue to benefit from acute OT with Recs listed below   Follow Up Recommendations  SNF;Supervision/Assistance - 24 hour    Equipment Recommendations   (TBD)    Recommendations for Other Services      Precautions / Restrictions Precautions Precautions: Fall Precaution Comments: NG, R Hemi, +2, IV Restrictions Weight Bearing Restrictions: No      Mobility Bed Mobility Overal bed mobility: Needs Assistance Bed Mobility: Rolling (and supine scooting to HOB) Rolling: Max assist;+2 for physical assistance Sidelying to sit: Min assist;HOB elevated (30 degrees)       General bed mobility comments: supine scoot to [facility] with HOB flat and in trend. noted pt repositioning trunk to rest in L lean position requiring use of pads for correction. no initiation or attempt to assist with rolling at this  time.  Transfers Overall transfer level: Needs assistance   Transfers: Sit to/from Stand;Stand Pivot Transfers Sit to Stand: Min assist Stand pivot transfers: Mod assist;Max assist       General transfer comment: Asisstance for R foot and hand placement during transfers, cuing pt to lean anteriorly to come to stand. During stand pivot EOB > bedside chair towards R, pt's R foot crossed behind her L and required assistance to manage it safely, modAx1 with maxAx1 to complete transfer.     Balance Overall balance assessment: Needs assistance Sitting-balance support: Bilateral upper extremity supported;Feet supported;Feet unsupported (no pushing noted in sitting this date)   Sitting balance - Comments: Min guard for static sitting EOB with UE support, no pushing noted with good maintenance of midline.   Standing balance support: No upper extremity supported Standing balance-Leahy Scale: Poor Standing balance comment: Requires +2 person assist at trunk to maintain static standing balance, with pt losing balance and requiring modAx1 and maxAx1 to maintain balance with dynamic standing.                           ADL either performed or assessed with clinical judgement   ADL Overall ADL's : Needs assistance/impaired     Grooming: Maximal assistance;Total assistance;Cueing for sequencing;Bed level Grooming Details (indicate cue type and reason): hand over hand for hand to face, able to grasp wash cloth, and perseverating on washing eyes, no attention to R side of face.                               General  ADL Comments: pt tolerated trial tasks for oral care, washing face and attempts UB bathing at bed level with significant difficulty, delayed procesing noted throughout with difficulty with initiation of task.     Vision   Additional Comments: appears with R inattention without stimulus to R side, able to track and sustain R visual attention with therapist  standing to R side of bed   Perception     Praxis      Cognition Arousal/Alertness: Awake/alert Behavior During Therapy: WFL for tasks assessed/performed Overall Cognitive Status: Impaired/Different from baseline Area of Impairment: Attention;Following commands;Safety/judgement;Awareness                   Current Attention Level: Selective (successful cues intermittently to look a direction)   Following Commands: Follows one step commands inconsistently Safety/Judgement: Decreased awareness of safety;Decreased awareness of deficits   Problem Solving: Slow processing;Decreased initiation;Difficulty sequencing;Requires tactile cues General Comments: Follows single step commands ~25% of time with increased time.        Exercises Exercises: General Lower Extremity (Attempted rhythmic initiation for R LE in L sidelying, x5)   Shoulder Instructions       General Comments      Pertinent Vitals/ Pain       Pain Assessment: Faces Faces Pain Scale: No hurt Pain Location: generalized response to noxious stimuli Pain Descriptors / Indicators: Grimacing Pain Intervention(s): Monitored during session;Repositioned;Limited activity within patient's tolerance  Home Living                                          Prior Functioning/Environment              Frequency  Min 2X/week      Progress Toward Goals  OT Goals(current goals can now be found in the care plan section)  Progress towards OT goals: Progressing toward goals  Acute Rehab OT Goals Patient Stated Goal: slowed progress towards goals  Plan Discharge plan remains appropriate    Co-evaluation        PT goals addressed during session: Strengthening/ROM;Mobility/safety with mobility OT goals addressed during session: ADL's and self-care;Strengthening/ROM      AM-PAC OT "6 Clicks" Daily Activity     Outcome Measure   Help from another person eating meals?: Total Help from another  person taking care of personal grooming?: A Lot Help from another person toileting, which includes using toliet, bedpan, or urinal?: Total Help from another person bathing (including washing, rinsing, drying)?: Total Help from another person to put on and taking off regular upper body clothing?: Total Help from another person to put on and taking off regular lower body clothing?: Total 6 Click Score: 7    End of Session    OT Visit Diagnosis: Muscle weakness (generalized) (M62.81);Hemiplegia and hemiparesis;Cognitive communication deficit (R41.841) Hemiplegia - Right/Left: Right Hemiplegia - dominant/non-dominant: Dominant Hemiplegia - caused by: Cerebral infarction   Activity Tolerance Patient limited by fatigue   Patient Left in bed;with call bell/phone within reach;with bed alarm set;with SCD's reapplied   Nurse Communication Mobility status (present in room during session)     Time: 5597-4163 OT Time Calculation (min): 25 min  Charges: OT General Charges $OT Visit: 1 Visit OT Treatments $Self Care/Home Management : 23-37 mins  Richetta Cubillos OTR/L acute rehab services Office: [phone]  [employer] Enrique Weiss 03/22/2020, 12:16 PM

## 2020-03-22 NOTE — Evaluation (Signed)
Clinical/Bedside Swallow Evaluation Patient Details  Name: Deborah [name] MRN: [id number] Date of Birth: Apr 24, 1971  Today's Date: 03/22/2020 Time: SLP Start Time (ACUTE ONLY): 1630 SLP Stop Time (ACUTE ONLY): 1643 SLP Time Calculation (min) (ACUTE ONLY): 13 min  Past Medical History:  Past Medical History:  Diagnosis Date  . DM (diabetes mellitus), type 2 (HCC)   . Hyperlipidemia   . Hypersomnia   . Sprain of right ankle, initial encounter   . Syncope    Past Surgical History: The histories are not reviewed yet. Please review them in the "History" navigator section and refresh this SmartLink. HPI:  49 y/o female with history of IDDM, HLD, tobacco use disorder presented to [facility] for facial droop, right sided weakness and global aphasia. Found to have LVO of left M1 segment and was transferred to [facility]. Pt underwent left common carotid arteriogram followed by revascularization of L MCA, however it reoccluded due to sever distal M1 stenosis.Pt underwent trach on 9/26.   Assessment / Plan / Recommendation Clinical Impression  Pt was seen for swallowing evaluation, still unable to tolerate PMV, but demonstrating improved secretion management without oral holding of saliva today. She has reduced ability to follow commands, but can elicit a swallow with tactile input from a dry spoon, and she consistently elicits a spontaneous swallow in response to PO trials of ice chips and water. Multiple subswallows are noted during trials though and there is some delayed coughing noted after trials. Given that PEG is being considered, recommend proceeding with MBS to better evaluate oropharyngeal swallowing.   SLP Visit Diagnosis: Dysphagia, unspecified (R13.10)    Aspiration Risk  Moderate aspiration risk    Diet Recommendation NPO   Medication Administration: Via alternative means    Other  Recommendations Oral Care Recommendations: Oral care QID Other Recommendations: Have oral suction  available   Follow up Recommendations Skilled Nursing facility      Frequency and Duration            Prognosis Prognosis for Safe Diet Advancement: Good Barriers to Reach Goals: Cognitive deficits;Language deficits      Swallow Study   General HPI: 49 y/o female with history of IDDM, HLD, tobacco use disorder presented to [facility] hospital for facial droop, right sided weakness and global aphasia. Found to have LVO of left M1 segment and was transferred to [facility]. Pt underwent left common carotid arteriogram followed by revascularization of L MCA, however it reoccluded due to sever distal M1 stenosis.Pt underwent trach on 9/26. Type of Study: Bedside Swallow Evaluation Previous Swallow Assessment: none in chart Diet Prior to this Study: NPO;NG Tube Temperature Spikes Noted: No Respiratory Status: Trach;Trach Collar Trach Size and Type: Cuff;#6;Deflated;With PMSV not in place History of Recent Intubation: Yes Length of Intubations (days): 5 days Date extubated:  (trach 9/26) Behavior/Cognition: Alert;Cooperative;Requires cueing Oral Cavity Assessment: Within Functional Limits Oral Cavity - Dentition: Edentulous Self-Feeding Abilities: Needs assist Patient Positioning: Upright in chair Baseline Vocal Quality: Not observed Volitional Cough: Cognitively unable to elicit Volitional Swallow:  (elicited with use of dry spoon)    Oral/Motor/Sensory Function Overall Oral Motor/Sensory Function:  (limited assessment; reduced ROM on R)   Ice Chips Ice chips: Impaired Presentation: Spoon Pharyngeal Phase Impairments: Multiple swallows;Cough - Delayed   Thin Liquid Thin Liquid: Impaired Presentation: Spoon Pharyngeal  Phase Impairments: Multiple swallows;Cough - Delayed    Nectar Thick Nectar Thick Liquid: Not tested   Honey Thick Honey Thick Liquid: Not tested   Puree Puree: Not tested  Solid     Solid: Not tested      Mahala Menghini., M.A. CCC-SLP Acute Rehabilitation  Services Pager [phone] Office [phone]  03/22/2020,5:02 PM

## 2020-03-23 ENCOUNTER — Inpatient Hospital Stay (HOSPITAL_COMMUNITY): Payer: Medicaid Other

## 2020-03-23 DIAGNOSIS — I63512 Cerebral infarction due to unspecified occlusion or stenosis of left middle cerebral artery: Secondary | ICD-10-CM | POA: Diagnosis not present

## 2020-03-23 LAB — CBC
HCT: 33.4 % — ABNORMAL LOW (ref 36.0–46.0)
Hemoglobin: 10.7 g/dL — ABNORMAL LOW (ref 12.0–15.0)
MCH: 31.6 pg (ref 26.0–34.0)
MCHC: 32 g/dL (ref 30.0–36.0)
MCV: 98.5 fL (ref 80.0–100.0)
Platelets: 464 10*3/uL — ABNORMAL HIGH (ref 150–400)
RBC: 3.39 MIL/uL — ABNORMAL LOW (ref 3.87–5.11)
RDW: 14.1 % (ref 11.5–15.5)
WBC: 16.3 10*3/uL — ABNORMAL HIGH (ref 4.0–10.5)
nRBC: 0 % (ref 0.0–0.2)

## 2020-03-23 LAB — GLUCOSE, CAPILLARY
Glucose-Capillary: 132 mg/dL — ABNORMAL HIGH (ref 70–99)
Glucose-Capillary: 170 mg/dL — ABNORMAL HIGH (ref 70–99)
Glucose-Capillary: 191 mg/dL — ABNORMAL HIGH (ref 70–99)
Glucose-Capillary: 196 mg/dL — ABNORMAL HIGH (ref 70–99)
Glucose-Capillary: 202 mg/dL — ABNORMAL HIGH (ref 70–99)
Glucose-Capillary: 243 mg/dL — ABNORMAL HIGH (ref 70–99)

## 2020-03-23 LAB — BASIC METABOLIC PANEL
Anion gap: 12 (ref 5–15)
BUN: 25 mg/dL — ABNORMAL HIGH (ref 6–20)
CO2: 25 mmol/L (ref 22–32)
Calcium: 9.3 mg/dL (ref 8.9–10.3)
Chloride: 97 mmol/L — ABNORMAL LOW (ref 98–111)
Creatinine, Ser: 0.79 mg/dL (ref 0.44–1.00)
GFR calc non Af Amer: >60 mL/min (ref >60)
Glucose, Bld: 146 mg/dL — ABNORMAL HIGH (ref 70–99)
Potassium: 4.5 mmol/L (ref 3.5–5.1)
Sodium: 134 mmol/L — ABNORMAL LOW (ref 135–145)

## 2020-03-23 NOTE — Progress Notes (Signed)
Inpatient Diabetes Program Recommendations  AACE/ADA: New Consensus Statement on Inpatient Glycemic Control (2015)  Target Ranges:  Prepandial:   less than 140 mg/dL      Peak postprandial:   less than 180 mg/dL (1-2 hours)      Critically ill patients:  140 - 180 mg/dL   Lab Results  Component Value Date   GLUCAP 243 (H) 03/23/2020   HGBA1C 10.7 (H) 03/09/2020     Noted tube feed coverage and correction was held at 0400 d/t CBG being 132 mg/dL. Most recent CBG 243 mg/dL. Would recommend continuing with current orders.   Thanks, Lujean Rave, MSN, RNC-OB Diabetes Coordinator [phone] (8a-5p)

## 2020-03-23 NOTE — Progress Notes (Signed)
At 0400, patient's CBG 132. RN suppose to administer a total of 18units of regular insulin. Contacted MD on call for advise, RN was insttructed to hold off insulin administration at that time. Patient stable, will continue to monitor.

## 2020-03-23 NOTE — Progress Notes (Signed)
NAME:  Deborah [name], MRN:  [id number], DOB:  [birth year], LOS: 15 ADMISSION DATE:  03/08/2020, CONSULTATION DATE:  03/08/20 REFERRING MD:  Neurology, CHIEF COMPLAINT:  CVA    Brief History   49 y/o female with history of IDDM, HLD, tobacco use disorder presented to [facility] for facial droop, right sided weakness and global aphasia. Found to have LVO of left M1 segment and was transferred to [facility] for attempt at endovascular revascularization.   History of present illness   49 y/o female presented after being found by her husband this morning with facial droop, right sided weakness, and global aphasia. LKN was 8:00 pm the night before. NIH score of 21 on arrival. She was immediately taken to IR and intubated for attempt at endovascular revascularization. Unfortunately, she had immediate reocclusion after achieving TICI 2c revascularization. Rescue stent not performed due to increased risk of ICH. She was transferred to the ICU for continued monitoring post-procedure.   Past Medical History  IDDM HLD Tobacco use Migraine headaches Chronic pain  Anxiety   Significant Hospital Events   9/21 > intubated for IR procedure 9/21> unsuccessful revascularization 9/21> admitted to neuro ICU 9/24: pt remains on vent on light sedation awake but able to utilize extremities to follow commands. Does hold eyes shut on command. Unable to perform tongue thrust. Pt and family would benefit from palliative consult, d/w Dr Roda Shutters (he will speak with pt's boyfriend who son is deferring decisions to). 3% stopped overnight 2/2 Na >155. Neurosx signing off despite some progressive shift on imaging. 9/25: Planning for tracheostomy today-this is rescheduled 9/26: No overnight events  Consults:  PCCM Neurosurgery   Procedures:  9/21>cerebral arteriogram with emergent mechanical thrombectomy  9/26 trach>  Significant Diagnostic Tests:  9/21 CT head > Acute left MCA territory infarct without hemorrhagic  conversion or progression from CT earlier today. Aspects remains 6  9/22 at 1 am CT head >Continued interval evolution of large left MCA territory infarct, overall similar in size and distribution from previous. Associated regional mass effect with partial effacement of the left lateral ventricle has mildly worsened, with worsened left-to-right midline shift now measuring up to 5 mm 9/22 at 7:50 am CT head > Unchanged extent of a large acute/early subacute left MCA vascular territory infarct and associated hemorrhage as compared to the head CT performed earlier the same day at 12:57 a.m. Continued slight interval increase in mass effect with partial effacement of the left lateral ventricle and now 6 mm rightward midline shift (previously 5 mm). The basal cisterns remain patent. No evidence of ventricular entrapment. 9/22 MRI brain > Large territory infarct left MCA territory with moderate hemorrhage and mild subarachnoid hemorrhage. There is edema and mass-effect with 7 mm midline shift to the right. Left MCA is occluded in the M1 segment distal to an early temporal branch. No other intracranial stenosis 9/24 cth: Acute left MCA territory infarct with mildly progressive swelling (9 mm midline shift) and regressed hemorrhage.   Micro Data:  N/A  Antimicrobials:  N/A   Interim history/subjective:  Plan for downsizing to a #6 cuffless in the near future currently with secretions and bleeding therefore not will not change at this time  Objective   Blood pressure 115/63, pulse 81, temperature 98.6 F (37 C), temperature source Oral, resp. rate 18, height 5' (1.524 m), weight 94.8 kg, SpO2 94 %.    FiO2 (%):  [28 %] 28 %   Intake/Output Summary (Last 24 hours) at 03/23/2020 1033 Last data  filed at 03/23/2020 3016 Gross per 24 hour  Intake 263.68 ml  Output 1450 ml  Net -1186.32 ml   Filed Weights   03/21/20 0441 03/22/20 0301 03/23/20 0336  Weight: 92.4 kg 93.1 kg 94.8 kg     Examination:  General: 49 year old female poorly responsive HEENT: #6 cuffed trach in place noted to have some bleeding around the trach site and thick secretions Neuro: Poorly responsive CV: Heart sounds are regular PULM: Diminished breath sounds GI: soft, bsx4 active, feeding tube in place  Extremities: warm/dry,  edema  Skin: no rashes or lesions    Assessment & Plan:   Left MCA stroke, unsuccessful revascularization -Neuro status remains unchanged-- CT H with stable L MCA infarct + stable 31mm L to R midline shift  Cerebral edema  P Per neuro  Acute respiratory failure secondary to inadequate airway protection -s/p tracheostomy 9/26 P Continue trach collar Consider downsizing on next Monday to a #6 cuffless trach  Inadequate PO intake -in setting of CVA above, dysphagia, tracheostomy status P Currently with core track in place plan for PEG in future   Insulin-dependent diabetes with severe hyperglycemia CBG (last 3)  Recent Labs    03/22/20 2346 03/23/20 0338 03/23/20 0737  GLUCAP 211* 132* 243*    Sliding scale insulin   PCCM will see weekly for trach needs.  Please call if needed sooner.  Best practice:  Diet: Tube feeds, PEG pending Pain/Anxiety/Delirium protocol (if indicated): off VAP protocol (if indicated): Y DVT prophylaxis: lovenox  GI prophylaxis: protonix  Glucose control: SSI, Lantus Mobility: Bedrest Code Status: FULL Family Communication: Sister updated at bedside Disposition: Progressive   Brett Canales Donnella Morford ACNP Acute Care Nurse Practitioner [facility] Pulmonary/Critical Care Please consult Amion 03/23/2020, 10:33 AM

## 2020-03-23 NOTE — Progress Notes (Signed)
Modified Barium Swallow Progress Note  Patient Details  Name: Deborah [name] MRN: [id number] Date of Birth: Nov 26, 1970  Today's Date: 03/23/2020  Modified Barium Swallow completed.  Full report located under Chart Review in the Imaging Section.  Brief recommendations include the following:  Clinical Impression   Pt presents with an oral more than pharyngeal dysphagia. She seems to have trouble with motor planning and coordination of swallowing throughout her oral preparation, beginning with bolus acceptance. SLP provided Mod cues for pt to open her mouth wide enough to accept a spoon. Despite cues, pt was not able to seal her lips around a cup or suck through a straw to get liquid from any other method. She loses a lot of the boluses from her mouth anteriorly, even with thicker consistencies like applesauce. She does reduced lingual coordination and strength, not able to achieve good bolus cohesion so that boluses spill prematurely into the pharynx, and also leaving diffuse residue behind that mixes with oral secretions. Premature spillage reaches the pyriform sinuses but pt triggers a swallow consistently and swiftly, often swallowing while she is still working on clearing her oral cavity. Sensed aspiration (PAS#7) occurred x1 before the swallow when premature spillage reached her larynx before swallow initiation occurred. Suction is needed to remove mild-moderate amounts of oral residue, after losing more of the bolus anteriorly. Although pt did not have significant amounts of aspiration during this study, she also would not be able to functionally consume enough POs to sustain adequate nutrition or hydration. Would continue with alternate means of nutrition, but SLP will f/u for ongoing dysphagia therapy.   Swallow Evaluation Recommendations       SLP Diet Recommendations: NPO     Medication Administration: Via alternative means   Other Recommendations: Have oral suction  available     Mahala Menghini., M.A. CCC-SLP Acute Rehabilitation Services Pager [phone] Office [phone]  03/23/2020,9:57 AM

## 2020-03-23 NOTE — Progress Notes (Signed)
STROKE TEAM PROGRESS NOTE   INTERVAL HISTORY No family at the bedside. Pt RN at bedside. Pt still has significant right hemiplegia and aphasia. Still has secretions from trach, seems increased from prior. Hyperglycemia improving. Continue current regimen. CCM on board for trach, need 30 days after trach to be placed for SNF.     Vitals:   03/23/20 0736 03/23/20 0755 03/23/20 1058 03/23/20 1137  BP: 115/63  119/67   Pulse: 90 81 70 77  Resp: 19 18 17 18   Temp: 98.6 F (37 C)  98.9 F (37.2 C)   TempSrc: Oral  Oral   SpO2: 92% 94% 98% 100%  Weight:      Height:       CBC:  Recent Labs  Lab 03/22/20 0123 03/23/20 0327  WBC 16.8* 16.3*  HGB 10.6* 10.7*  HCT 33.3* 33.4*  MCV 102.8* 98.5  PLT 358 464*   Basic Metabolic Panel:  Recent Labs  Lab 03/22/20 0123 03/23/20 0327  NA 131* 134*  K 4.5 4.5  CL 97* 97*  CO2 19* 25  GLUCOSE 306* 146*  BUN 25* 25*  CREATININE 0.78 0.79  CALCIUM 9.0 9.3   IMAGING past 24 hours DG Swallowing Func-Speech Pathology  Result Date: 03/23/2020 Objective Swallowing Evaluation: Type of Study: MBS-Modified Barium Swallow Study  Patient Details Name: Deborah [name] MRN: Quintin Alto Date of Birth: 1970-08-06 Today's Date: 03/23/2020 Time: SLP Start Time (ACUTE ONLY): 05/23/2020 -SLP Stop Time (ACUTE ONLY): 0933 SLP Time Calculation (min) (ACUTE ONLY): 15 min Past Medical History: Past Medical History: Diagnosis Date . DM (diabetes mellitus), type 2 (HCC)  . Hyperlipidemia  . Hypersomnia  . Sprain of right ankle, initial encounter  . Syncope  Past Surgical History:  The histories are not reviewed yet. Please review them in the "History" navigator section and refresh this SmartLink. HPI: 49 y/o female with history of IDDM, HLD, tobacco use disorder presented to [facility] for facial droop, right sided weakness and global aphasia. Found to have LVO of left M1 segment and was transferred to [facility]. Pt underwent left common carotid arteriogram followed by  revascularization of L MCA, however it reoccluded due to sever distal M1 stenosis.Pt underwent trach on 9/26.  Subjective: alert, not verbal Assessment / Plan / Recommendation CHL IP CLINICAL IMPRESSIONS 03/23/2020 Clinical Impression Pt presents with an oral more than pharyngeal dysphagia. She seems to have trouble with motor planning and coordination of swallowing throughout her oral preparation, beginning with bolus acceptance. SLP provided Mod cues for pt to open her mouth wide enough to accept a spoon. Despite cues, pt was not able to seal her lips around a cup or suck through a straw to get liquid from any other method. She loses a lot of the boluses from her mouth anteriorly, even with thicker consistencies like applesauce. She does reduced lingual coordination and strength, not able to achieve good bolus cohesion so that boluses spill prematurely into the pharynx, and also leaving diffuse residue behind that mixes with oral secretions. Premature spillage reaches the pyriform sinuses but pt triggers a swallow consistently and swiftly, often swallowing while she is still working on clearing her oral cavity. Sensed aspiration (PAS#7) occurred x1 before the swallow when premature spillage reached her larynx before swallow initiation occurred. Suction is needed to remove mild-moderate amounts of oral residue, after losing more of the bolus anteriorly. Although pt did not have significant amounts of aspiration during this study, she also would not be able to functionally consume enough  POs to sustain adequate nutrition or hydration. Would continue with alternate means of nutrition, but SLP will f/u for ongoing dysphagia therapy.  SLP Visit Diagnosis Dysphagia, oropharyngeal phase (R13.12) Attention and concentration deficit following -- Frontal lobe and executive function deficit following -- Impact on safety and function Moderate aspiration risk   CHL IP TREATMENT RECOMMENDATION 03/23/2020 Treatment Recommendations  Therapy as outlined in treatment plan below   Prognosis 03/23/2020 Prognosis for Safe Diet Advancement Good Barriers to Reach Goals Cognitive deficits;Language deficits Barriers/Prognosis Comment -- CHL IP DIET RECOMMENDATION 03/23/2020 SLP Diet Recommendations NPO Liquid Administration via -- Medication Administration Via alternative means Compensations -- Postural Changes --   CHL IP OTHER RECOMMENDATIONS 03/23/2020 Recommended Consults -- Oral Care Recommendations -- Other Recommendations Have oral suction available   CHL IP FOLLOW UP RECOMMENDATIONS 03/23/2020 Follow up Recommendations Skilled Nursing facility   [facility] IP FREQUENCY AND DURATION 03/23/2020 Speech Therapy Frequency (ACUTE ONLY) min 2x/week Treatment Duration 2 weeks      CHL IP ORAL PHASE 03/23/2020 Oral Phase Impaired Oral - Pudding Teaspoon -- Oral - Pudding Cup -- Oral - Honey Teaspoon -- Oral - Honey Cup -- Oral - Nectar Teaspoon -- Oral - Nectar Cup -- Oral - Nectar Straw -- Oral - Thin Teaspoon Right anterior bolus loss;Lingual pumping;Reduced posterior propulsion;Lingual/palatal residue;Right pocketing in lateral sulci;Left pocketing in lateral sulci;Pocketing in anterior sulcus;Delayed oral transit;Decreased bolus cohesion;Premature spillage Oral - Thin Cup -- Oral - Thin Straw -- Oral - Puree Lingual pumping;Reduced posterior propulsion;Lingual/palatal residue;Right pocketing in lateral sulci;Left pocketing in lateral sulci;Pocketing in anterior sulcus;Delayed oral transit;Decreased bolus cohesion;Premature spillage Oral - Mech Soft -- Oral - Regular -- Oral - Multi-Consistency -- Oral - Pill -- Oral Phase - Comment --  CHL IP PHARYNGEAL PHASE 03/23/2020 Pharyngeal Phase Impaired Pharyngeal- Pudding Teaspoon -- Pharyngeal -- Pharyngeal- Pudding Cup -- Pharyngeal -- Pharyngeal- Honey Teaspoon -- Pharyngeal -- Pharyngeal- Honey Cup -- Pharyngeal -- Pharyngeal- Nectar Teaspoon -- Pharyngeal -- Pharyngeal- Nectar Cup -- Pharyngeal -- Pharyngeal- Nectar  Straw -- Pharyngeal -- Pharyngeal- Thin Teaspoon Delayed swallow initiation-pyriform sinuses;Penetration/Aspiration before swallow Pharyngeal Material enters airway, passes BELOW cords and not ejected out despite cough attempt by patient Pharyngeal- Thin Cup -- Pharyngeal -- Pharyngeal- Thin Straw -- Pharyngeal -- Pharyngeal- Puree Delayed swallow initiation-pyriform sinuses Pharyngeal -- Pharyngeal- Mechanical Soft -- Pharyngeal -- Pharyngeal- Regular -- Pharyngeal -- Pharyngeal- Multi-consistency -- Pharyngeal -- Pharyngeal- Pill -- Pharyngeal -- Pharyngeal Comment --  CHL IP CERVICAL ESOPHAGEAL PHASE 03/23/2020 Cervical Esophageal Phase WFL Pudding Teaspoon -- Pudding Cup -- Honey Teaspoon -- Honey Cup -- Nectar Teaspoon -- Nectar Cup -- Nectar Straw -- Thin Teaspoon -- Thin Cup -- Thin Straw -- Puree -- Mechanical Soft -- Regular -- Multi-consistency -- Pill -- Cervical Esophageal Comment -- Mahala Menghini., M.A. CCC-SLP Acute Rehabilitation Services Pager [phone] Office [phone] 03/23/2020, 10:08 AM                PHYSICAL EXAM   Temp:  [98.4 F (36.9 C)-98.9 F (37.2 C)] 98.9 F (37.2 C) (10/06 1058) Pulse Rate:  [70-97] 77 (10/06 1137) Resp:  [15-26] 18 (10/06 1137) BP: (112-126)/(62-67) 119/67 (10/06 1058) SpO2:  [92 %-100 %] 100 % (10/06 1137) FiO2 (%):  [28 %] 28 % (10/06 1137) Weight:  [94.8 kg] 94.8 kg (10/06 0336)  General - Well nourished, well developed, in no apparent distress, on trach collar.  Ophthalmologic - fundi not visualized due to noncooperation.  Cardiovascular - Regular rhythm and rate.  Neuro - awake  alert, eyes open, on trach collar, nonverbal, able to track bilaterally, no gaze deviation but left gaze preference, not blinking to visual threat on the right.  Able to follow "open eyes" "close eyes" but then perseverated on that.  Slow response to peripheral commands on the left hand and foot, however not consistent.  Right facial droop, tongue protrusion not  corporative.  Left upper extremity at least 4/5.  Left lower extremity at least 3/5.  Right upper extremity flaccid.  Right lower extremity slight withdraw to pain.  Right Babinski positive. Sensation, coordination not corporative and gait not tested.   ASSESSMENT/PLAN Deborah [name] is a 49 y.o. female with history of difficulty to control DB, HLD, syncope presenting to [facility] with R hemiparesis, facial droop, left gaze, and global aphasia w/ glucose 400s where she was found to have L M1 occlusion and transferred to [facility] for IR.   Stroke:   L MCA infarct s/p IR w/ post procedure hemorrhage, infarct secondary to large vessel disease source  CT head [facility]) L MCA infarct w/ ASPECTS 6  CTA head & neck [facility]) L M2 cutoff  CT head L MCA territory infarct. ASPECTS 6.   CT perfusion 63 core L brain same as CT. 32 penumbra. 1.5 mismatch ratio.  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 Post IR hemorrhage.  CT head 9/21 1818 increased L MCA ischemic infarct w/ mass effect and partial effacement L lateral ventrilc w/ 57mm midline shift. Small foci L BG HT same w/ increased L perisylvian and L cerebral convexity hemorrhage.   CT head 9/22 0151 evolution large L MCA infarct w/ slightly worse effacement and mass effect w/ 31mm midline shift. Scattered foci hemorrhage in infarct same w/ contrast clearing. L M1 and proximal L MCA branches w/ persistent hyperdensity.   CT head 9/22 0827  L MCA infarct and hemorrhages same. Increase in mass effect and effacement now 27mm midline shift. L M1 and proximal L MCA braches w/ persistent hyperdensity.   MRI 9/22 Large L MCA infarct w/ moderate infarct and SAH. Edema, 79mm midline shift.   MRA 9/22 L MCA 1 occlusion   CT head 9/24 L MCA infarct w/ mild increase in edema, 44mm midline shift    CT repeat 9/26 - Stable extent of  left MCA territory infarct with small volume blood products. Midline shift continues to measure 9 mm.  2D Echo EF 60-65%. No source of embolus   Recent Zio patch Normal w/ infrequent PACs, PVCs 01/07/2020  LDL 174   HgbA1c 10.7   VTE prophylaxis - Lovenox 40 mg sq daily    clopidogrel 75 mg daily prior to admission, now on No antithrombotic. Pt allergic to ASA, and hold off plavix now due to potential PEG tube placement  Therapy recommendations:  SNF   Disposition:  pending (trach must be at least 70d old for SNF admission, today Hosp D#15)  Acute Respiratory Failure  Secondary to stroke  Intubated for IR, left intubated post IR    Off sedation  CCM on board   Trach placed 03/13/20 - on trach collar  Has size 6 cuffed trach. Will need to go to cuffless prior to d/c. CCM following to downsize, plans for next Monday 10/11. Janina Mayo must be at least 38d old for SNF admission)  CCM on board for trach management     Cerebral Edema  MRI 9/22 with  MLS @ 7mm  CT head 9/24 L MCA infarct w/ mild increase in edema, 9mm midline shift    CT repeat 9/26 - Stable extent of left MCA territory infarct with small volume blood products. Midline shift continues to measure 9 mm.  On 3% protocol - @75 ->50->NS-> off  23.4% x 1 (9/25)  Na 131->134  Leukocytosis   afebrile  WBC 17.4->18->16.8->16.3  UA - WBC 11-20  CXR - bibasilar atx  Urine culture insignificant growth  Increased trach secretions - PRN suctioning  On Rocephin 5 day course  Hyperlipidemia  Home meds:  lipitor 20  LDL 174, goal < 70  On lipitor 80  Continue statin at discharge  Diabetes type II Uncontrolled  Home meds:  Glimepiride , lantus 21, liraglutide 1.8  HgbA1c 10.7, goal < 7.0  CBGs  Hyperglycemia improving  SSI 0-15  On lantus 28 -> 35 -> 25 bid ->30 bid  novolog premeal 3U Q4 -> 5U Q4 -> 8U Q4->16U Q4  Diabetic coordinator on board  Change diet formula from Jevity to  Glucerna  Dysphagia . Secondary to stroke . NPO except meds . on TF @ 47 via cortrak  . On IVF @ 27 . On FW flush 60cc Q4 -> 20cc Q4 . Likely needs PEG - hold off plavix for now  . MBS 10/6 - continue NPO and meds via alternative means.  Marland Kitchen. Speech on board  Tobacco abuse  Current smoker  Will provide smoking cessation when able    Other Stroke Risk Factors  UDS positive benzos and barb (done following IR procedure)   Obesity, Body mass index is 40.82 kg/m., recommend weight loss, diet and exercise as appropriate   Family hx stroke (paternal grandfather)  Migraines on fioricet  Other Active Problems  Chronic pain, Anxiety  Hypokalemia 3.1 - supplemented - 4.5 - resolved  Hyponatremia sodium 135->131->134  Hospital day # 15  Marvel PlanJindong Lydiana Milley, MD PhD Stroke Neurology 03/23/2020 2:29 PM   To contact Stroke Continuity provider, please refer to [email or web address]. After hours, contact General Neurology

## 2020-03-24 DIAGNOSIS — I63512 Cerebral infarction due to unspecified occlusion or stenosis of left middle cerebral artery: Secondary | ICD-10-CM | POA: Diagnosis not present

## 2020-03-24 DIAGNOSIS — I6602 Occlusion and stenosis of left middle cerebral artery: Secondary | ICD-10-CM | POA: Diagnosis not present

## 2020-03-24 LAB — BASIC METABOLIC PANEL
Anion gap: 10 (ref 5–15)
BUN: 24 mg/dL — ABNORMAL HIGH (ref 6–20)
CO2: 27 mmol/L (ref 22–32)
Calcium: 9.3 mg/dL (ref 8.9–10.3)
Chloride: 95 mmol/L — ABNORMAL LOW (ref 98–111)
Creatinine, Ser: 0.74 mg/dL (ref 0.44–1.00)
GFR calc non Af Amer: >60 mL/min (ref >60)
Glucose, Bld: 161 mg/dL — ABNORMAL HIGH (ref 70–99)
Potassium: 4.3 mmol/L (ref 3.5–5.1)
Sodium: 132 mmol/L — ABNORMAL LOW (ref 135–145)

## 2020-03-24 LAB — GLUCOSE, CAPILLARY
Glucose-Capillary: 174 mg/dL — ABNORMAL HIGH (ref 70–99)
Glucose-Capillary: 121 mg/dL — ABNORMAL HIGH (ref 70–99)
Glucose-Capillary: 126 mg/dL — ABNORMAL HIGH (ref 70–99)
Glucose-Capillary: 173 mg/dL — ABNORMAL HIGH (ref 70–99)
Glucose-Capillary: 130 mg/dL — ABNORMAL HIGH (ref 70–99)

## 2020-03-24 LAB — CBC
HCT: 33 % — ABNORMAL LOW (ref 36.0–46.0)
Hemoglobin: 10.6 g/dL — ABNORMAL LOW (ref 12.0–15.0)
MCH: 31.6 pg (ref 26.0–34.0)
MCHC: 32.1 g/dL (ref 30.0–36.0)
MCV: 98.5 fL (ref 80.0–100.0)
Platelets: 494 10*3/uL — ABNORMAL HIGH (ref 150–400)
RBC: 3.35 MIL/uL — ABNORMAL LOW (ref 3.87–5.11)
RDW: 14.1 % (ref 11.5–15.5)
WBC: 15.7 10*3/uL — ABNORMAL HIGH (ref 4.0–10.5)
nRBC: 0 % (ref 0.0–0.2)

## 2020-03-24 MED ORDER — PROSOURCE TF PO LIQD
45.0000 mL | Freq: Every day | ORAL | Status: DC
  Administered 2020-03-25 – 2020-03-30 (×6): 45 mL
  Filled 2020-03-24 (×6): qty 45

## 2020-03-24 MED ORDER — GLUCERNA 1.5 CAL PO LIQD
1000.0000 mL | ORAL | Status: DC
  Administered 2020-03-24 – 2020-03-29 (×6): 1000 mL
  Filled 2020-03-24 (×10): qty 1000

## 2020-03-24 NOTE — Progress Notes (Signed)
Rehab Admissions Coordinator Note:  Patient was screened by Clois Dupes for appropriateness for an Inpatient Acute Rehab Consult per change in therapy recs. Today.   At this time, we are recommending Inpatient Rehab consult. I will place order per protocol.  Clois Dupes RN MSN 03/24/2020, 3:08 PM  I can be reached at [phone].

## 2020-03-24 NOTE — Progress Notes (Signed)
Physical Therapy Treatment Patient Details Name: Deborah [name] MRN: [id number] DOB: [birth year] Today's Date: 03/24/2020    History of Present Illness 49 y/o female with history of IDDM, HLD, tobacco use disorder presented to [facility] for facial droop, right sided weakness and global aphasia. Found to have LVO of left M1 segment and was transferred to [facility]. Pt underwent left common carotid arteriogram followed by revascularization of L MCA, however it reoccluded due to sever distal M1 stenosis.Pt underwent trach on 9/26.    PT Comments    Pt is making significant progress towards her goals. She continues to demonstrate improved ability to follow directions, but continues to require multiple attempts to cue pt for her to remain attentive to task. Pt was eager to participate and smiled several times during the session. Her sats remained >/= 94% throughout the session with her trach collar removed. She has displayed improved core control as she is able to maintain midline orientation while sitting EOB. She also can maintain midline in standing, suggesting decreased pushing syndrome. However, when she fatigues she does tend to lean laterally to the R in standing. Facilitated R knee extension with cues at R knee when performing STS reps from stedy. Secondary to her significant improvement in function, improved ability to follow directions, increased tolerance to physical activity, and her current functional status varying greatly from her PLOF as she was independent previously and is young in age, recommendations have been changed from SNF to CIR. Will continue to follow-up with pt with acute PT services to further address her deficits to inc her independence and safety with all functional mobility.    Follow Up Recommendations  CIR     Equipment Recommendations  Wheelchair (measurements PT);Wheelchair cushion (measurements PT);Hospital bed (further equipment TBD)    Recommendations for Other  Services Rehab consult     Precautions / Restrictions Precautions Precautions: Fall Precaution Comments: NG, R Hemi, +2, IV, trach collar Restrictions Weight Bearing Restrictions: No    Mobility  Bed Mobility Overal bed mobility: Needs Assistance Bed Mobility: Supine to Sit     Supine to sit: HOB elevated;Mod assist     General bed mobility comments: HOB elevated, VC's initially to bring LEs towards EOB with successful initiation of L LE, requiring assistance for R LE. VC's and TC's for L UE placement on L bed rail and TC's to initiate trunk ascension.  Transfers Overall transfer level: Needs assistance Equipment used: Ambulation equipment used Transfers: Sit to/from [employer] Sit to Stand: Min guard;Min assist;+2 physical assistance Stand pivot transfers: +2 safety/equipment       General transfer comment: VC's and HOH cues for R hand to remain on stedy bar, cuing pt to pull to come to stand, with minA-min guard x2. Facilitated R knee extension by providing cues at R knee and stabilizing joint, x5 consecutive STS from stedy.   Ambulation/Gait                 Stairs             Wheelchair Mobility    Modified Rankin (Stroke Patients Only) Modified Rankin (Stroke Patients Only) Pre-Morbid Rankin Score: No symptoms Modified Rankin: Severe disability     Balance Overall balance assessment: Needs assistance Sitting-balance support: Bilateral upper extremity supported;Feet supported;Feet unsupported (no pushing in sitting, improved midline control; min guard) Sitting balance-Leahy Scale: Poor Sitting balance - Comments: Min guard for static sitting EOB with UE support, no pushing noted with good maintenance of midline. Postural  control: Right lateral lean Standing balance support: Bilateral upper extremity supported Standing balance-Leahy Scale: Poor Standing balance comment: Static standing with stedy and HOH assist to maintain R hand  contact with bar. Able to stand for up to ~3 min on first rep then ~30-60 seconds on subsequent 2 reps. Tendency to lean laterally to R when fatigued, but displayed improved ability to follow cues to lean to L to maintain midline. Up to modA to maintain balance when pt would lean to R but otherwise minA - min guard.                             Cognition Arousal/Alertness: Awake/alert Behavior During Therapy: WFL for tasks assessed/performed Overall Cognitive Status: Impaired/Different from baseline Area of Impairment: Attention;Following commands;Safety/judgement;Awareness                 Orientation Level:  (pt able to respond to her name ) Current Attention Level: Selective (requires multiple cues to remain on task)   Following Commands: Follows one step commands inconsistently;Follows one step commands with increased time Safety/Judgement: Decreased awareness of safety;Decreased awareness of deficits   Problem Solving: Slow processing;Decreased initiation;Difficulty sequencing;Requires verbal cues;Requires tactile cues General Comments: Follows single step commands ~50% of time with repetetive cues.      Exercises General Exercises - Lower Extremity Mini-Sqauts: Strengthening;5 reps (in stedy with min-min guard x2; TC's at R knee)    General Comments General comments (skin integrity, edema, etc.): Trach collar doffed during session with sats remaining >/= 94% throughout; Trach collar returned onto pt at end of session      Pertinent Vitals/Pain Pain Assessment: Faces Faces Pain Scale: No hurt Pain Intervention(s): Monitored during session    Home Living                      Prior Function            PT Goals (current goals can now be found in the care plan section) Acute Rehab PT Goals PT Goal Formulation: Patient unable to participate in goal setting Time For Goal Achievement: 03/26/20 Potential to Achieve Goals: Good Progress towards PT  goals: Progressing toward goals    Frequency    Min 4X/week      PT Plan Discharge plan needs to be updated;Frequency needs to be updated    Co-evaluation     PT goals addressed during session: Strengthening/ROM;Proper use of DME;Balance;Mobility/safety with mobility        AM-PAC PT "6 Clicks" Mobility   Outcome Measure  Help needed turning from your back to your side while in a flat bed without using bedrails?: A Lot Help needed moving from lying on your back to sitting on the side of a flat bed without using bedrails?: A Lot Help needed moving to and from a bed to a chair (including a wheelchair)?: A Lot Help needed standing up from a chair using your arms (e.g., wheelchair or bedside chair)?: A Little Help needed to walk in hospital room?: Total Help needed climbing 3-5 steps with a railing? : Total 6 Click Score: 11    End of Session Equipment Utilized During Treatment: Gait belt;Oxygen Activity Tolerance: Patient limited by fatigue Patient left: in chair;with call bell/phone within reach;with chair alarm set Nurse Communication: Mobility status (vitals without trach collar) PT Visit Diagnosis: Unsteadiness on feet (R26.81);Muscle weakness (generalized) (M62.81);Difficulty in walking, not elsewhere classified (R26.2);Other symptoms and signs involving the  nervous system (R29.898);Hemiplegia and hemiparesis Hemiplegia - Right/Left: Right Hemiplegia - caused by: Cerebral infarction     Time: 1100-1132 PT Time Calculation (min) (ACUTE ONLY): 32 min  Charges:  $Therapeutic Activity: 8-22 mins $Neuromuscular Re-education: 8-22 mins                     Raymond Gurney, PT, DPT Acute Rehabilitation Services  Pager: [phone] Office: [phone]    Jewel Baize 03/24/2020, 2:36 PM

## 2020-03-24 NOTE — Consult Note (Addendum)
Physical Medicine and Rehabilitation Consult Reason for Consult: Right side weakness and inability to speak Referring Physician: Dr.Xu   HPI: Quintin AltoSylvia Seng is a 49 y.o. right-handed female with history of diabetes mellitus hyperlipidemia and tobacco use.  She presented on 03/08/2020 with right hemiparesis and dysarthria to outside hospital.  History taken from chart review and husband due to cognition/verbal output.  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 CTA confirming a left M2 cutoff.  [facility] per interventional radiology.  MRI showed large territory infarct with moderate hemorrhage and mild subarachnoid hemorrhage.  Edema with mass-effect with 7 mm right-sided midline shift.  Echocardiogram with ejection fraction of 60 to 65%, no wall motion abnormalities.  EEG negative for seizures.  Required intubation for extended period of time and underwent tracheostomy 03/13/2020 per Dr. Levon Hedgeraniel Smith.  Tentatively planning to change to #4 cuffless on 03/28/2020.  Initially on aspirin and Plavix for CVA prophylaxis currently held with anticipation of need for PEG tube.  Subcutaneous Lovenox for DVT prophylaxis.  Hospital course further complicated by UTI, on IV Rocephin.  Na+ 134 on 10/8. Therapy evaluations completed with recommendations of physical medicine rehab consult.  Review of Systems  Unable to perform ROS: Acuity of condition   Past Medical History:  Diagnosis Date  . DM (diabetes mellitus), type 2 (HCC)   . Hyperlipidemia   . Hypersomnia   . Sprain of right ankle, initial encounter   . Syncope    Past Surgical History:  Procedure Laterality Date  . IR CT HEAD LTD  03/08/2020  . IR PERCUTANEOUS ART THROMBECTOMY/INFUSION INTRACRANIAL INC DIAG ANGIO  03/08/2020  . RADIOLOGY WITH ANESTHESIA N/A 03/08/2020   Procedure: IR WITH ANESTHESIA;  Surgeon: Julieanne Cottoneveshwar, Sanjeev, MD;  Location: [facility] OR;  Service: Radiology;  Laterality: N/A;    Family History  Problem Relation Age of Onset  . Heart defect Mother        Leaky Valve  . Heart Problems Father        Enlarged heart   . Heart defect Maternal Grandmother        Leaky Valve   . Stroke Paternal Grandfather    Social History:  reports that she has been smoking cigarettes. She has been smoking about 0.50 packs per day. She has never used smokeless tobacco. She reports previous alcohol use. She reports previous drug use. Allergies:  Allergies  Allergen Reactions  . Aspirin Shortness Of Breath  . Mushroom Extract Complex Swelling   Medications Prior to Admission  Medication Sig Dispense Refill  . acetaminophen (TYLENOL) 325 MG tablet Take 325 mg by mouth every 6 (six) hours as needed.    Marland Kitchen. albuterol (VENTOLIN HFA) 108 (90 Base) MCG/ACT inhaler Inhale 2 puffs into the lungs every 6 (six) hours as needed for wheezing or shortness of breath.     . ALPRAZolam (XANAX) 0.5 MG tablet Take 0.5 mg by mouth 3 (three) times daily as needed for anxiety. 1 tablet by mouth TID PRN    . atorvastatin (LIPITOR) 20 MG tablet Take 20 mg by mouth daily.    . Butalbital-APAP-Caffeine 50-325-40 MG capsule Take 1 capsule by mouth 2 (two) times daily as needed for headache.     . cyclobenzaprine (FLEXERIL) 5 MG tablet Take 5 mg by mouth 3 (three) times daily.     . diphenhydrAMINE (BENADRYL) 25 mg capsule Take 25 mg by mouth daily.    Marland Kitchen. glimepiride (AMARYL)  2 MG tablet Take 2 mg by mouth daily with breakfast.    . insulin glargine (LANTUS SOLOSTAR) 100 UNIT/ML Solostar Pen Inject 28 Units into the skin daily.     Marland Kitchen liraglutide (VICTOZA) 18 MG/3ML SOPN Inject 1.8 mg into the skin daily. 1.8 daily      . Phenyleph-Doxylamine-DM-APAP (NYQUIL SEVERE COLD/FLU) 5-6.25-10-325 MG/15ML LIQD Take 15 mLs by mouth every 4 (four) hours as needed (cough).    . promethazine (PHENERGAN) 25 MG tablet Take 25 mg by mouth 3 (three) times daily as needed for nausea (dizziness).     . traMADol (ULTRAM) 50 MG  tablet Take 50-100 mg by mouth every 8 (eight) hours as needed for moderate pain.     Marland Kitchen clopidogrel (PLAVIX) 75 MG tablet Take 1 tablet (75 mg total) by mouth daily. 90 tablet 2  . Erenumab-aooe 70 MG/ML SOAJ Inject into the skin. Once Monthly (Patient not taking: Reported on 03/08/2020)    . glucose blood (ACCU-CHEK AVIVA PLUS) test strip USE 1 STRIP UTD    . Insulin Pen Needle (NOVOFINE) 32G X 6 MM MISC USE BID    . Lancets (ACCU-CHEK MULTICLIX) lancets U TID    . levofloxacin (LEVAQUIN) 500 MG tablet Take by mouth. (Patient not taking: Reported on 03/08/2020)      Home: Home Living Family/patient expects to be discharged to:: Other (Comment) Additional Comments: difficult to determine, pt intubated, no family present  Functional History: Prior Function Comments: Per palliative note, Pt was ambulatory and performing her own ADL Functional Status:  Mobility: Bed Mobility Overal bed mobility: Needs Assistance Bed Mobility: Supine to Sit, Rolling Rolling: +2 for physical assistance, Mod assist Sidelying to sit: Mod assist, +2 for physical assistance, +2 for safety/equipment, HOB elevated Supine to sit: HOB elevated, Mod assist Sit to supine: Total assist, +2 for physical assistance General bed mobility comments: Despite HOB elevated, requiring multi-modal cues to bring BLEs off the bed, able to reach out to grab therapist's hand to come to sitting, but requiring support at trunk x2 to come to full upright position Transfers Overall transfer level: Needs assistance Equipment used: Ambulation equipment used Transfer via Lift Equipment: Stedy Transfers: Sit to/from Stand Sit to Stand: +2 physical assistance, +2 safety/equipment, Mod assist Stand pivot transfers: +2 safety/equipment General transfer comment: Increased need for VC's and assist on RLE and RUE in order to pull to stand, pt often attempting to stand pre-emptively and demonstrated decreased trunk control on R side, able to  complete 4 sit<>stands, but fatigued quickly Ambulation/Gait General Gait Details: unable    ADL: ADL Overall ADL's : Needs assistance/impaired Grooming: Maximal assistance, Total assistance, Cueing for sequencing, Bed level Grooming Details (indicate cue type and reason): hand over hand for hand to face, able to grasp wash cloth, and perseverating on washing eyes, no attention to R side of face. Toileting- Clothing Manipulation and Hygiene: Moderate assistance, +2 for physical assistance, +2 for safety/equipment, Cueing for sequencing, Cueing for safety, Bed level Toileting - Clothing Manipulation Details (indicate cue type and reason): Rolling to complete peri-care Functional mobility during ADLs: Maximal assistance, +2 for physical assistance, +2 for safety/equipment General ADL Comments: Pt continues to demonstrate a delay in processing, but adamantly showing therapists her composite flexion and extension with the LUE  Cognition: Cognition Overall Cognitive Status: Impaired/Different from baseline Arousal/Alertness: Awake/alert Orientation Level: Other (comment) Attention: Focused Focused Attention: Appears intact Cognition Arousal/Alertness: Awake/alert Behavior During Therapy: WFL for tasks assessed/performed Overall Cognitive Status: Impaired/Different from baseline Area  of Impairment: Attention, Following commands, Safety/judgement, Awareness Orientation Level:  (pt able to respond to her name ) Current Attention Level: Selective Following Commands: Follows one step commands inconsistently, Follows one step commands with increased time Safety/Judgement: Decreased awareness of safety, Decreased awareness of deficits Problem Solving: Slow processing, Decreased initiation, Difficulty sequencing, Requires verbal cues, Requires tactile cues General Comments: Decreased ability to follow one step commands to date, requiring multi-modal cues and reorienting to task Difficult to  assess due to: Impaired communication, Tracheostomy  Blood pressure (!) 163/83, pulse 91, temperature 98.1 F (36.7 C), temperature source Oral, resp. rate 20, height 5' (1.524 m), weight 94.8 kg, SpO2 98 %. Physical Exam Vitals reviewed.  Constitutional:      Appearance: She is obese.     Comments: Patient able to do significantly more per husband, however withdrawn behaviors, regarding incontinent bowel movement.  HENT:     Head: Normocephalic and atraumatic.     Comments: + NG    Right Ear: External ear normal.     Left Ear: External ear normal.     Nose: Nose normal.  Eyes:     General:        Right eye: No discharge.        Left eye: No discharge.     Extraocular Movements: Extraocular movements intact.  Neck:     Comments: + Trach Cardiovascular:     Rate and Rhythm: Normal rate and regular rhythm.  Pulmonary:     Effort: Pulmonary effort is normal. No respiratory distress.     Breath sounds: No stridor.  Abdominal:     General: Abdomen is flat. There is no distension.  Musculoskeletal:     Comments: No edema or tenderness in extremities  Skin:    General: Skin is warm and dry.  Neurological:     Mental Status: She is alert.     Comments: Patient is alert makes eye contact with examiner.   Limited verbal output with tracheostomy tube in place.  Moving left side freely, no movement noted on right side  Psychiatric:     Comments: Unable to assess due to nonverbal     Results for orders placed or performed during the hospital encounter of 03/08/20 (from the past 24 hour(s))  Glucose, capillary     Status: Abnormal   Collection Time: 03/24/20  3:52 PM  Result Value Ref Range   Glucose-Capillary 173 (H) 70 - 99 mg/dL  Glucose, capillary     Status: Abnormal   Collection Time: 03/24/20  8:00 PM  Result Value Ref Range   Glucose-Capillary 130 (H) 70 - 99 mg/dL  Glucose, capillary     Status: Abnormal   Collection Time: 03/25/20  1:02 AM  Result Value Ref Range    Glucose-Capillary 162 (H) 70 - 99 mg/dL  Glucose, capillary     Status: Abnormal   Collection Time: 03/25/20  4:22 AM  Result Value Ref Range   Glucose-Capillary 105 (H) 70 - 99 mg/dL  CBC     Status: Abnormal   Collection Time: 03/25/20  4:47 AM  Result Value Ref Range   WBC 14.2 (H) 4.0 - 10.5 K/uL   RBC 3.31 (L) 3.87 - 5.11 MIL/uL   Hemoglobin 10.5 (L) 12.0 - 15.0 g/dL   HCT 16.1 (L) 36 - 46 %   MCV 97.9 80.0 - 100.0 fL   MCH 31.7 26.0 - 34.0 pg   MCHC 32.4 30.0 - 36.0 g/dL   RDW 09.6 04.5 -  15.5 %   Platelets 483 (H) 150 - 400 K/uL   nRBC 0.0 0.0 - 0.2 %  Basic metabolic panel     Status: Abnormal   Collection Time: 03/25/20  4:47 AM  Result Value Ref Range   Sodium 134 (L) 135 - 145 mmol/L   Potassium 4.2 3.5 - 5.1 mmol/L   Chloride 96 (L) 98 - 111 mmol/L   CO2 26 22 - 32 mmol/L   Glucose, Bld 95 70 - 99 mg/dL   BUN 26 (H) 6 - 20 mg/dL   Creatinine, Ser 4.09 0.44 - 1.00 mg/dL   Calcium 9.2 8.9 - 81.1 mg/dL   GFR calc non Af Amer >60 >60 mL/min   Anion gap 12 5 - 15  Glucose, capillary     Status: Abnormal   Collection Time: 03/25/20  7:55 AM  Result Value Ref Range   Glucose-Capillary 167 (H) 70 - 99 mg/dL   Comment 1 Notify RN    Comment 2 Document in Chart   Glucose, capillary     Status: Abnormal   Collection Time: 03/25/20 11:48 AM  Result Value Ref Range   Glucose-Capillary 136 (H) 70 - 99 mg/dL   No results found.  Assessment/Plan: Diagnosis: Large left MCA territory infarct with moderate hemorrhage and mild subarachnoid hemorrhage   Stroke: Continue secondary stroke prophylaxis and Risk Factor Modification listed below:   Blood Pressure Management:  Continue current medication with prn's with permisive HTN per primary team Statin Agent:   Diabetes management:   Tobacco abuse:   ?Right sided hemiparesis: fit for orthosis to prevent contractures (resting hand splint for day, wrist cock up splint at night, PRAFO, etc) Motor recovery: Fluoxetine Labs  independently reviewed.  Records reviewed and summated above.  1. Does the need for close, 24 hr/day medical supervision in concert with the patient's rehab needs make it unreasonable for this patient to be served in a less intensive setting? Potentially  2. Co-Morbidities requiring supervision/potential complications: DM (Monitor in accordance with exercise and adjust meds as necessary), hyperlipidemia, tobacco use (counsel and appropriate), morbid obesity (encourage weight loss), post-stroke dysphagia. 3. Due to bladder management, bowel management, safety, skin/wound care, disease management, medication administration and patient education, does the patient require 24 hr/day rehab nursing? Yes 4. Does the patient require coordinated care of a physician, rehab nurse, therapy disciplines of PT/OT/SLP to address physical and functional deficits in the context of the above medical diagnosis(es)? Yes Addressing deficits in the following areas: balance, endurance, locomotion, strength, transferring, bowel/bladder control, bathing, dressing, toileting, cognition, speech, language, swallowing and psychosocial support 5. Can the patient actively participate in an intensive therapy program of at least 3 hrs of therapy per day at least 5 days per week? Yes 6. The potential for patient to make measurable gains while on inpatient rehab is excellent 7. Anticipated functional outcomes upon discharge from inpatient rehab are supervision  with PT, supervision and min assist with OT, modified independent and supervision with SLP. 8. Estimated rehab length of stay to reach the above functional goals is: 16-19 days. 9. Anticipated discharge destination: Home 10. Overall Rehab/Functional Prognosis: good  RECOMMENDATIONS: This patient's condition is appropriate for continued rehabilitative care in the following setting: Potentially CIR.  Patient making good functional gains on acute floor.  Will consider admission if  patient does not progress to supervision level of functioning after medically stable/completion of medical work-up.  Patient has agreed to participate in recommended program. Potentially Note that insurance prior authorization  may be required for reimbursement for recommended care.  Comment: Rehab Admissions Coordinator to follow up.  I have personally performed a face to face diagnostic evaluation, including, but not limited to relevant history and physical exam findings, of this patient and developed relevant assessment and plan.  Additionally, I have reviewed and concur with the physician assistant's documentation above.   Maryla Morrow, MD, ABPMR Mcarthur Rossetti Angiulli, PA-C 03/25/2020

## 2020-03-24 NOTE — Progress Notes (Signed)
STROKE TEAM PROGRESS NOTE   INTERVAL HISTORY Sister at the bedside.  Patient neurologically stable, and changed.  However still has copious secretions from trach, need frequent suctioning.  Hypoglycemia much improved on current insulin regimen, leukocytosis improving.  No fever.  Vitals:   03/24/20 1100 03/24/20 1214 03/24/20 1400 03/24/20 1537  BP: 133/70 133/70 (!) 106/53   Pulse: 75 74 74 82  Resp: 16 15 17 20   Temp: 98.3 F (36.8 C)     TempSrc: Oral  Oral   SpO2: 96% 96% 97% 98%  Weight:      Height:       CBC:  Recent Labs  Lab 03/23/20 0327 03/24/20 0428  WBC 16.3* 15.7*  HGB 10.7* 10.6*  HCT 33.4* 33.0*  MCV 98.5 98.5  PLT 464* 494*   Basic Metabolic Panel:  Recent Labs  Lab 03/23/20 0327 03/24/20 0428  NA 134* 132*  K 4.5 4.3  CL 97* 95*  CO2 25 27  GLUCOSE 146* 161*  BUN 25* 24*  CREATININE 0.79 0.74  CALCIUM 9.3 9.3   IMAGING past 24 hours No results found.   PHYSICAL EXAM   Temp:  [98.3 F (36.8 C)-99.1 F (37.3 C)] 98.3 F (36.8 C) (10/07 1100) Pulse Rate:  [70-88] 82 (10/07 1537) Resp:  [15-20] 20 (10/07 1537) BP: (106-133)/(53-72) 106/53 (10/07 1400) SpO2:  [96 %-99 %] 98 % (10/07 1537) FiO2 (%):  [28 %] 28 % (10/07 1537)  General - Well nourished, well developed, in no apparent distress, on trach collar.  Ophthalmologic - fundi not visualized due to noncooperation.  Cardiovascular - Regular rhythm and rate.  Neuro - awake alert, eyes open, on trach collar, nonverbal, able to track bilaterally, no gaze deviation but left gaze preference, not blinking to visual threat on the right.  Able to follow "open eyes" "close eyes" but then perseverated on that.  Slow response to peripheral commands on the left hand and foot, however not consistent.  Right facial droop, tongue protrusion not corporative.  Left upper extremity at least 4/5.  Left lower extremity at least 3/5.  Right upper extremity flaccid.  Right lower extremity slight withdraw to  pain.  Right Babinski positive. Sensation, coordination not corporative and gait not tested.   ASSESSMENT/PLAN Ms. Emilea Goga is a 49 y.o. female with history of difficulty to control DB, HLD, syncope presenting to [facility] with R hemiparesis, facial droop, left gaze, and global aphasia w/ glucose 400s where she was found to have L M1 occlusion and transferred to [facility] for IR.   Stroke:   L MCA infarct s/p IR w/ post procedure hemorrhage, infarct secondary to large vessel disease source  CT head [facility]) L MCA infarct w/ ASPECTS 6  CTA head & neck [facility]) L M2 cutoff  CT head L MCA territory infarct. ASPECTS 6.   CT perfusion 63 core L brain same as CT. 32 penumbra. 1.5 mismatch ratio.  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 Post IR hemorrhage.  CT head 9/21 1818 increased L MCA ischemic infarct w/ mass effect and partial effacement L lateral ventrilc w/ 47mm midline shift. Small foci L BG HT same w/ increased L perisylvian and L cerebral convexity hemorrhage.   CT head 9/22 0151 evolution large L MCA infarct w/ slightly worse effacement and mass effect w/ 11mm midline shift. Scattered foci hemorrhage in infarct same w/ contrast clearing. L  M1 and proximal L MCA branches w/ persistent hyperdensity.   CT head 9/22 0827  L MCA infarct and hemorrhages same. Increase in mass effect and effacement now 19mm midline shift. L M1 and proximal L MCA braches w/ persistent hyperdensity.   MRI 9/22 Large L MCA infarct w/ moderate infarct and SAH. Edema, 71mm midline shift.   MRA 9/22 L MCA 1 occlusion   CT head 9/24 L MCA infarct w/ mild increase in edema, 47mm midline shift    CT repeat 9/26 - Stable extent of left MCA territory infarct with small volume blood products. Midline shift continues to measure 9 mm.  2D Echo EF 60-65%. No source of embolus   Recent Zio  patch Normal w/ infrequent PACs, PVCs 01/07/2020  LDL 174   HgbA1c 10.7   VTE prophylaxis - Lovenox 40 mg sq daily    clopidogrel 75 mg daily prior to admission, now on No antithrombotic. Pt allergic to ASA, and hold off plavix now due to potential PEG tube placement  Therapy recommendations:  SNF -> improvement - CIR considering - consult in place  Disposition:  pending (trach must be at least 39d old for SNF admission, today Hosp D#16)  Acute Respiratory Failure  Secondary to stroke  Intubated for IR, left intubated post IR    Off sedation  CCM on board   Trach placed 03/13/20 - on trach collar  Has size 6 cuffed trach. Will need to go to cuffless prior to d/c. CCM following to downsize, plans for next Monday 10/11. Janina Mayo must be at least 21d old for SNF admission)  CCM on board for trach management     Cerebral Edema  MRI 9/22 with MLS @ 72mm  CT head 9/24 L MCA infarct w/ mild increase in edema, 47mm midline shift    CT repeat 9/26 - Stable extent of left MCA territory infarct with small volume blood products. Midline shift continues to measure 9 mm.  On 3% protocol - @75 ->50->NS-> off  23.4% x 1 (9/25)  Na 131->134->132  Leukocytosis   afebrile  WBC 17.4->18->16.8->16.3->15.7  UA - WBC 11-20  CXR - bibasilar atx  Urine culture insignificant growth  Increased trach secretions - PRN suctioning  Finished Rocephin 5 day course  Hyperlipidemia  Home meds:  lipitor 20  LDL 174, goal < 70  On lipitor 80  Continue statin at discharge  Diabetes type II Uncontrolled  Home meds:  Glimepiride , lantus 21, liraglutide 1.8  HgbA1c 10.7, goal < 7.0  CBGs  Hyperglycemia much improved  SSI 0-15  On lantus 28 -> 35 -> 25 bid ->30 bid  novolog premeal 3U Q4 -> 5U Q4 -> 8U Q4->16U Q4  Diabetic coordinator on board  Change diet formula from Jevity to Glucerna  Dysphagia . Secondary to stroke . NPO except meds . on TF @ 45 via cortrak  . On  IVF @ 25 . On FW flush 60cc Q4 -> 20cc Q4 . Likely needs PEG - hold off plavix for now  . MBS 10/6 - continue NPO and meds via alternative means.  09-29-1970 Speech on board  Tobacco abuse  Current smoker  Will provide smoking cessation when able    Other Stroke Risk Factors  UDS positive benzos and barb (done following IR procedure)   Obesity, Body mass index is 40.82 kg/m., recommend weight loss, diet and exercise as appropriate   Family hx stroke (paternal grandfather)  Migraines on fioricet  Other Active Problems  Chronic  pain, Anxiety  Hypokalemia 3.1 - supplemented - 4.5 - resolved  Hyponatremia sodium 135->131->134->132  Hospital day # 16  Marvel Plan, MD PhD Stroke Neurology 03/24/2020 4:06 PM   To contact Stroke Continuity provider, please refer to [email or web address]. After hours, contact General Neurology

## 2020-03-24 NOTE — Progress Notes (Signed)
Nutrition Follow-up  DOCUMENTATION CODES:   Obesity unspecified  INTERVENTION:  Continue TF via Cortrak: -Transition to Glucerna 1.5 @ 65m/hr -455mProsource TF daily  Provides 1660 kcal (100% of needs), 100 grams of protein, and 81937mree water   NUTRITION DIAGNOSIS:   Inadequate oral intake related to inability to eat as evidenced by NPO status.  Ongoing  GOAL:   Patient will meet greater than or equal to 90% of their needs  Met with TF  MONITOR:   Diet advancement, Labs, Weight trends, TF tolerance, Skin, I & O's  REASON FOR ASSESSMENT:   Consult, Ventilator Enteral/tube feeding initiation and management  ASSESSMENT:   Pt with PMH of IDDM, HLD, tobacco abuse admitted to OOH with stroke symptoms. [facility] s/p unsuccessful revascularization.  9/24- cortrak tube placed (tip of tube in gastric region) 9/26- s/p trach 9/28- transitioned to trach collar 10/6- failed MBS  Pt continues to have significant R hemiplegia and aphasia. Pt continues to receive TF via Cortrak. Due to persistent hyperglycemia, MD transitioned pt to Glucerna on 10/5. Current TF regimen ordered: Glucerna 1.2 cal @ 48m31m, 48ml43msource TF BID. This provides 1376kcals and 86grams of protein, which is insufficient to meet pt's needs. Will adjust regimen to better meet pt's needs. Recommend pt have PEG placed if pt's swallowing does not improve.   UOP: 1875ml 29mhours I/O: +16.9ml si53m admit  Labs: Na 132 (L), CBGs 121-202 (diabetes coordinator following) Medications: colace, novolog, lantus, mvi, miralax  Diet Order:   Diet Order            Diet NPO time specified  Diet effective midnight                 EDUCATION NEEDS:   No education needs have been identified at this time  Skin:  Skin Assessment: Reviewed RN Assessment  Last BM:  10/4 type 7  Height:   Ht Readings from Last 1 Encounters:  03/08/20 5' (1.524 m)    Weight:   Wt Readings  from Last 1 Encounters:  03/23/20 94.8 kg    Ideal Body Weight:  45.4 kg  BMI:  Body mass index is 40.82 kg/m.  Estimated Nutritional Needs:   Kcal:  1650-1850  Protein:  90-105 grams  Fluid:  > 1.6 L  Jonny Dearden Larkin InaD, LDN RD pager number and weekend/on-call pager number located in [email or web address]

## 2020-03-25 ENCOUNTER — Encounter (HOSPITAL_COMMUNITY): Payer: Self-pay | Admitting: Neurology

## 2020-03-25 DIAGNOSIS — E785 Hyperlipidemia, unspecified: Secondary | ICD-10-CM

## 2020-03-25 DIAGNOSIS — Z930 Tracheostomy status: Secondary | ICD-10-CM | POA: Diagnosis not present

## 2020-03-25 DIAGNOSIS — I69391 Dysphagia following cerebral infarction: Secondary | ICD-10-CM | POA: Diagnosis not present

## 2020-03-25 DIAGNOSIS — N390 Urinary tract infection, site not specified: Secondary | ICD-10-CM

## 2020-03-25 DIAGNOSIS — I6602 Occlusion and stenosis of left middle cerebral artery: Secondary | ICD-10-CM | POA: Diagnosis not present

## 2020-03-25 DIAGNOSIS — Z720 Tobacco use: Secondary | ICD-10-CM | POA: Diagnosis present

## 2020-03-25 DIAGNOSIS — I63512 Cerebral infarction due to unspecified occlusion or stenosis of left middle cerebral artery: Secondary | ICD-10-CM | POA: Diagnosis not present

## 2020-03-25 LAB — BASIC METABOLIC PANEL
Anion gap: 12 (ref 5–15)
BUN: 26 mg/dL — ABNORMAL HIGH (ref 6–20)
CO2: 26 mmol/L (ref 22–32)
Calcium: 9.2 mg/dL (ref 8.9–10.3)
Chloride: 96 mmol/L — ABNORMAL LOW (ref 98–111)
Creatinine, Ser: 0.76 mg/dL (ref 0.44–1.00)
GFR calc non Af Amer: >60 mL/min (ref >60)
Glucose, Bld: 95 mg/dL (ref 70–99)
Potassium: 4.2 mmol/L (ref 3.5–5.1)
Sodium: 134 mmol/L — ABNORMAL LOW (ref 135–145)

## 2020-03-25 LAB — GLUCOSE, CAPILLARY
Glucose-Capillary: 162 mg/dL — ABNORMAL HIGH (ref 70–99)
Glucose-Capillary: 105 mg/dL — ABNORMAL HIGH (ref 70–99)
Glucose-Capillary: 167 mg/dL — ABNORMAL HIGH (ref 70–99)
Glucose-Capillary: 136 mg/dL — ABNORMAL HIGH (ref 70–99)
Glucose-Capillary: 118 mg/dL — ABNORMAL HIGH (ref 70–99)
Glucose-Capillary: 179 mg/dL — ABNORMAL HIGH (ref 70–99)

## 2020-03-25 LAB — CBC
HCT: 32.4 % — ABNORMAL LOW (ref 36.0–46.0)
Hemoglobin: 10.5 g/dL — ABNORMAL LOW (ref 12.0–15.0)
MCH: 31.7 pg (ref 26.0–34.0)
MCHC: 32.4 g/dL (ref 30.0–36.0)
MCV: 97.9 fL (ref 80.0–100.0)
Platelets: 483 10*3/uL — ABNORMAL HIGH (ref 150–400)
RBC: 3.31 MIL/uL — ABNORMAL LOW (ref 3.87–5.11)
RDW: 13.8 % (ref 11.5–15.5)
WBC: 14.2 10*3/uL — ABNORMAL HIGH (ref 4.0–10.5)
nRBC: 0 % (ref 0.0–0.2)

## 2020-03-25 LAB — ANTITHROMBIN III: AntiThromb III Func: 122 % — ABNORMAL HIGH (ref 75–120)

## 2020-03-25 MED ORDER — INSULIN ASPART 100 UNIT/ML ~~LOC~~ SOLN
10.0000 [IU] | SUBCUTANEOUS | Status: DC
  Administered 2020-03-25 – 2020-03-30 (×25): 10 [IU] via SUBCUTANEOUS

## 2020-03-25 MED ORDER — INSULIN GLARGINE 100 UNIT/ML ~~LOC~~ SOLN
25.0000 [IU] | Freq: Two times a day (BID) | SUBCUTANEOUS | Status: DC
  Administered 2020-03-25 – 2020-03-30 (×10): 25 [IU] via SUBCUTANEOUS
  Filled 2020-03-25 (×11): qty 0.25

## 2020-03-25 MED ORDER — CLOPIDOGREL BISULFATE 75 MG PO TABS
75.0000 mg | ORAL_TABLET | Freq: Every day | ORAL | Status: DC
  Administered 2020-03-25 – 2020-03-30 (×6): 75 mg
  Filled 2020-03-25 (×6): qty 1

## 2020-03-25 MED ORDER — CLOPIDOGREL BISULFATE 75 MG PO TABS: 75.0000 mg | ORAL_TABLET | Freq: Every day | ORAL | Status: DC

## 2020-03-25 MED ORDER — INSULIN ASPART 100 UNIT/ML ~~LOC~~ SOLN: 15.0000 [IU] | SUBCUTANEOUS | Status: DC

## 2020-03-25 NOTE — Progress Notes (Signed)
Physical Therapy Treatment Patient Details Name: Deborah [name] MRN: [id number] DOB: [birth year] Today's Date: 03/25/2020    History of Present Illness 49 y/o female with history of IDDM, HLD, tobacco use disorder presented to [facility] for facial droop, right sided weakness and global aphasia. Found to have LVO of left M1 segment and was transferred to [facility]. Pt underwent left common carotid arteriogram followed by revascularization of L MCA, however it reoccluded due to sever distal M1 stenosis.Pt underwent trach on 9/26.    PT Comments    Pt continues to have difficulty follow 1-step commands and can be impulsive with her trying to stand prior to being cued. Pt displayed inc R lateral lean this date. She flexes at her R trunk, not her hips and thus requires a physical block at her R ribs location. As pt's fatigue increases so does her R lateral lean. She continues to maintain her sats >/= 94% when on room air during treatment. Trach collar donned at end of session at 5 L 28% FiO2. Will continue to follow-up with pt with acute PT services to address her deficits to improve her safety and independence with all functional mobility. CIR continues to be an appropriate recommendation for this pt based on her age, ability to withstand inc treatment time, progress towards her goals, and difference between her current status and PLOF.  Follow Up Recommendations  CIR     Equipment Recommendations  Wheelchair (measurements PT);Wheelchair cushion (measurements PT);Hospital bed;3in1 (PT) (further equipment TBD)    Recommendations for Other Services Rehab consult     Precautions / Restrictions Precautions Precautions: Fall Precaution Comments: NG, R Hemi, +2, IV, trach collar (5 L 28% FiO2) Restrictions Weight Bearing Restrictions: No    Mobility  Bed Mobility Overal bed mobility: Needs Assistance Bed Mobility: Supine to Sit;Rolling Rolling: +2 for physical assistance;Mod  assist Sidelying to sit: Mod assist;+2 for physical assistance;+2 for safety/equipment;HOB elevated Supine to sit: HOB elevated;+2 for physical assistance;Mod assist     General bed mobility comments: Despite HOB elevated, requiring multi-modal cues to bring BLEs off the bed, able to reach out to grab therapist's hand to come to sitting, but requiring support at trunk x2 to come to full upright position  Transfers Overall transfer level: Needs assistance Equipment used: Ambulation equipment used Transfers: Sit to/from Stand Sit to Stand: +2 physical assistance;+2 safety/equipment;Max assist;Min assist (progressed from maxAx1 & minAx1 to minAx2)         General transfer comment: Increased need for VC's and assist on RLE and RUE in order to pull to stand, pt often attempting to stand pre-emptively and demonstrated decreased trunk control on R side, able to complete 4 sit<>stands, but fatigued quickly. MaxAx1 with minAx1 for 2 of the STS reps then minAx2 for other 2 STS reps. TC's provided to R quad to extend knee.  Ambulation/Gait                 Stairs             Wheelchair Mobility    Modified Rankin (Stroke Patients Only) Modified Rankin (Stroke Patients Only) Pre-Morbid Rankin Score: No symptoms Modified Rankin: Severe disability     Balance Overall balance assessment: Needs assistance Sitting-balance support: Bilateral upper extremity supported;Feet supported Sitting balance-Leahy Scale: Poor Sitting balance - Comments: Min guard for static sitting EOB with UE support for 3-5 seconds before requiring min-maxA to maintain balance, no pushing noted with increased multi-modal cues required to maintain midline Postural control: Right lateral  lean Standing balance support: Bilateral upper extremity supported Standing balance-Leahy Scale: Poor Standing balance comment: Static standing with stedy x4 in session, mod A required on R in order to maintain trunk  engagement and upright posture. Increased fatigue noted in session, as pt only able to withstand 20 seconds with each trial. TC's and VC's provided at R quad to contract muslce to extend knee.                            Cognition Arousal/Alertness: Awake/alert Behavior During Therapy: WFL for tasks assessed/performed Overall Cognitive Status: Impaired/Different from baseline Area of Impairment: Attention;Following commands;Safety/judgement;Awareness;Problem solving                   Current Attention Level: Selective   Following Commands: Follows one step commands inconsistently;Follows one step commands with increased time Safety/Judgement: Decreased awareness of safety;Decreased awareness of deficits   Problem Solving: Slow processing;Decreased initiation;Difficulty sequencing;Requires verbal cues;Requires tactile cues General Comments: Decreased ability to follow one step commands to date, requiring multi-modal cues and reorienting to task      Exercises      General Comments General comments (skin integrity, edema, etc.): Trach collar doffed during session with sats remaining >/= 94% throughout; Trach collar returned onto pt at end of session      Pertinent Vitals/Pain Pain Assessment: Faces Faces Pain Scale: Hurts a little bit Pain Location: generalized response to noxious stimuli Pain Descriptors / Indicators: Grimacing Pain Intervention(s): Monitored during session;Repositioned    Home Living                      Prior Function            PT Goals (current goals can now be found in the care plan section) Acute Rehab PT Goals Patient Stated Goal: slowed progress towards goals PT Goal Formulation: Patient unable to participate in goal setting Time For Goal Achievement: 03/26/20 Potential to Achieve Goals: Good Progress towards PT goals: Progressing toward goals    Frequency    Min 4X/week      PT Plan Current plan remains  appropriate    Co-evaluation PT/OT/SLP Co-Evaluation/Treatment: Yes Reason for Co-Treatment: Complexity of the patient's impairments (multi-system involvement);For patient/therapist safety;To address functional/ADL transfers PT goals addressed during session: Mobility/safety with mobility;Balance OT goals addressed during session: ADL's and self-care;Proper use of Adaptive equipment and DME;Strengthening/ROM      AM-PAC PT "6 Clicks" Mobility   Outcome Measure  Help needed turning from your back to your side while in a flat bed without using bedrails?: A Lot Help needed moving from lying on your back to sitting on the side of a flat bed without using bedrails?: A Lot Help needed moving to and from a bed to a chair (including a wheelchair)?: A Lot Help needed standing up from a chair using your arms (e.g., wheelchair or bedside chair)?: A Lot Help needed to walk in hospital room?: Total Help needed climbing 3-5 steps with a railing? : Total 6 Click Score: 10    End of Session Equipment Utilized During Treatment: Gait belt;Oxygen (room air during treatment but trach collar donned at end) Activity Tolerance: Patient limited by fatigue Patient left: in chair;with call bell/phone within reach;with chair alarm set   PT Visit Diagnosis: Unsteadiness on feet (R26.81);Muscle weakness (generalized) (M62.81);Difficulty in walking, not elsewhere classified (R26.2);Other symptoms and signs involving the nervous system (R29.898);Hemiplegia and hemiparesis Hemiplegia - Right/Left: Right  Hemiplegia - caused by: Cerebral infarction     Time: 8333-8329 PT Time Calculation (min) (ACUTE ONLY): 35 min  Charges:  $Neuromuscular Re-education: 8-22 mins                     Raymond Gurney, PT, DPT Acute Rehabilitation Services  Pager: [phone] Office: [phone]    Deborah [name] 03/25/2020, 1:22 PM

## 2020-03-25 NOTE — Progress Notes (Signed)
  Speech Language Pathology Treatment: Dysphagia;Cognitive-Linquistic;Passy Muir Speaking valve  Patient Details Name: Deborah [name] MRN: [id number] DOB: 09/14/1970 Today's Date: 03/25/2020 Time: 5916-3846 SLP Time Calculation (min) (ACUTE ONLY): 30 min  Assessment / Plan / Recommendation Clinical Impression  Pt was seen for skilled ST targeting cognitive-linguistic, PMV, and swallowing goals.  Cuff deflated upon arrival, and a small amount of secretions were suctioned from trach hub.  Pt was unable to redirect air through the upper airway with PMV placed as evidenced by hearing a burst of air and release of pressure when valve was removed.  Pt also had increased work of breathing noted after having the valve place for only a few breath cycles.  Continue to recommend downsizing of trach and/or changing to cuffless trach to improve toleration of PMV.  Pt was able to move her articulators on command to approximate production of /ah/ and could even coordinate inhalation with articulatory movement; however, unable to achieve voicing despite max assist multimodal cues.  Suspect challenges in motor planning to be contributing to difficulty phonating.  SLP also facilitated the session with trials of thin liquids via teaspoon following oral care to continue working towards initiation of PO diet.  No coughing or subswallows noted with PO trials with max cues to "close your mouth and swallow."  All pt's and husband's questions were answered to their satisfaction at this time.  Pt was left in recliner with husband and CIR admissions coordinator at bedside.  Continue per current plan of care.    HPI HPI: 49 y/o female with history of IDDM, HLD, tobacco use disorder presented to [facility] for facial droop, right sided weakness and global aphasia. Found to have LVO of left M1 segment and was transferred to [facility] Pt underwent left common carotid arteriogram followed by revascularization of L MCA, however it  reoccluded due to sever distal M1 stenosis.Pt underwent trach on 9/26.      SLP Plan  Continue with current plan of care       Recommendations  Diet recommendations: NPO Medication Administration: Via alternative means      Patient may use Passy-Muir Speech Valve: with SLP only MD: Please consider changing trach tube to : Smaller size;Cuffless         Oral Care Recommendations: Oral care QID Follow up Recommendations: Inpatient Rehab SLP Visit Diagnosis: Dysphagia, oropharyngeal phase (R13.12);Aphonia (R49.1);Apraxia (R48.2);Aphasia (R47.01) Plan: Continue with current plan of care       GO                Dhrithi Riche, Melanee Spry 03/25/2020, 2:01 PM

## 2020-03-25 NOTE — Progress Notes (Addendum)
STROKE TEAM PROGRESS NOTE   INTERVAL HISTORY Fiance and RN are at bedside. Pt lying in bed, neuro unchanged. Still has some secretion from trach but seems improving per RN. PT OT have recommended CIR now, will hold off PEG, will put on plavix.   Vitals:   03/25/20 0317 03/25/20 0333 03/25/20 0735 03/25/20 0752  BP: 127/61   (!) 107/50  Pulse: 90 87 83 84  Resp: 17 16 17 18   Temp: 99.4 F (37.4 C)   99.6 F (37.6 C)  TempSrc: Oral   Oral  SpO2: 97% 97% 95% 94%  Weight:      Height:       CBC:  Recent Labs  Lab 03/24/20 0428 03/25/20 0447  WBC 15.7* 14.2*  HGB 10.6* 10.5*  HCT 33.0* 32.4*  MCV 98.5 97.9  PLT 494* 483*   Basic Metabolic Panel:  Recent Labs  Lab 03/24/20 0428 03/25/20 0447  NA 132* 134*  K 4.3 4.2  CL 95* 96*  CO2 27 26  GLUCOSE 161* 95  BUN 24* 26*  CREATININE 0.74 0.76  CALCIUM 9.3 9.2   IMAGING past 24 hours No results found.   PHYSICAL EXAM    Temp:  [98.8 F (37.1 C)-99.6 F (37.6 C)] 99.6 F (37.6 C) (10/08 0752) Pulse Rate:  [69-90] 84 (10/08 0752) Resp:  [15-20] 18 (10/08 0752) BP: (106-133)/(50-75) 107/50 (10/08 0752) SpO2:  [94 %-100 %] 94 % (10/08 0752) FiO2 (%):  [28 %] 28 % (10/08 0735)  General - Well nourished, well developed, in no apparent distress, on trach collar.  Ophthalmologic - fundi not visualized due to noncooperation.  Cardiovascular - Regular rhythm and rate.  Neuro - awake alert, eyes open, on trach collar, nonverbal, able to track bilaterally, no gaze deviation but left gaze preference, not blinking to visual threat on the right.  Able to follow "open eyes" "close eyes" but then perseverated on that.  Not able to follow peripheral commands.  Right facial droop, tongue protrusion not corporative.  Left upper extremity at least 4/5.  Left lower extremity at least 3/5.  Right upper extremity flaccid.  Right lower extremity slight withdraw to pain.  Right Babinski positive. Sensation, coordination not corporative  and gait not tested.   ASSESSMENT/PLAN Ms. Kerigan Narvaez is a 49 y.o. female with history of difficulty to control DB, HLD, syncope presenting to [facility] with R hemiparesis, facial droop, left gaze, and global aphasia w/ glucose 400s where she was found to have L M1 occlusion and transferred to [facility] for IR.   Stroke:   L MCA infarct s/p IR w/ post procedure hemorrhage, infarct secondary to large vessel disease source  CT head [facility]) L MCA infarct w/ ASPECTS 6  CTA head & neck [facility]) L M2 cutoff  CT head L MCA territory infarct. ASPECTS 6.   CT perfusion 63 core L brain same as CT. 32 penumbra. 1.5 mismatch ratio.  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 Post IR hemorrhage.  CT head 9/21 1818 increased L MCA ischemic infarct w/ mass effect and partial effacement L lateral ventrilc w/ 33mm midline shift. Small foci L BG HT same w/ increased L perisylvian and L cerebral convexity hemorrhage.   CT head 9/22 0151 evolution large L MCA infarct w/ slightly worse effacement and mass effect w/ 15mm midline shift. Scattered foci hemorrhage in infarct same w/ contrast clearing. L  M1 and proximal L MCA branches w/ persistent hyperdensity.   CT head 9/22 0827  L MCA infarct and hemorrhages same. Increase in mass effect and effacement now 72mm midline shift. L M1 and proximal L MCA braches w/ persistent hyperdensity.   MRI 9/22 Large L MCA infarct w/ moderate infarct and SAH. Edema, 61mm midline shift.   MRA 9/22 L MCA 1 occlusion   CT head 9/24 L MCA infarct w/ mild increase in edema, 1mm midline shift    CT repeat 9/26 - Stable extent of left MCA territory infarct with small volume blood products. Midline shift continues to measure 9 mm.  2D Echo EF 60-65%. No source of embolus   Recent Zio patch Normal w/ infrequent PACs, PVCs 01/07/2020  May consider further  cardioembolic work up if further neuro improvement  LDL 174   HgbA1c 10.7   B12/TSH/RPR/ANA pending  Hypercoagulable work up pending  VTE prophylaxis - Lovenox 40 mg sq daily    clopidogrel 75 mg daily prior to admission, now on plavix for stroke prevention (allergic to ASA).    Therapy recommendations:  CIR  Disposition:  pending  Acute Respiratory Failure  Secondary to stroke  Intubated for IR, left intubated post IR    Off sedation  CCM on board   Trach placed 03/13/20 - on trach collar  Has size 6 cuffed trach. Will need to go to cuffless prior to d/c. CCM following to downsize, plans for next Monday 10/11. Janina Mayo must be at least 32d old if for SNF admission)  CCM on board for trach management     Cerebral Edema  MRI 9/22 with MLS @ 65mm  CT head 9/24 L MCA infarct w/ mild increase in edema, 24mm midline shift    CT repeat 9/26 - Stable extent of left MCA territory infarct with small volume blood products. Midline shift continues to measure 9 mm.  On 3% protocol - @75 ->50->NS-> off  23.4% x 1 (9/25)  Leukocytosis   afebrile  WBC 17.4->18->16.8->16.3->15.7->14.2  UA - WBC 11-20  CXR - bibasilar atx  Urine culture insignificant growth  Increased trach secretions - PRN suctioning  Finished Rocephin 5 day course  Hyperlipidemia  Home meds:  lipitor 20  LDL 174, goal < 70  On lipitor 80  Continue statin at discharge  Diabetes type II Uncontrolled  Home meds:  Glimepiride , lantus 21, liraglutide 1.8  HgbA1c 10.7, goal < 7.0  CBGs  Hyperglycemia much improved  SSI 0-15  On lantus 28 -> 35 -> 25 bid ->30 bid->25 bid  novolog premeal 3U Q4 -> 5U Q4 -> 8U Q4->16U Q4->10U Q4  Changed TF formula from Jevity to Glucerna  Dysphagia  Secondary to stroke  NPO except meds  on TF @ 45 via cortrak   On IVF @ 25  On FW flush 60cc Q4 -> 20cc Q4  MBS 10/6 - continue NPO and meds via alternative means.   Speech on board  Tobacco  abuse  Current smoker  Will provide smoking cessation when able    Other Stroke Risk Factors  UDS positive benzos and barb (done following IR procedure)   Obesity, Body mass index is 40.82 kg/m., recommend weight loss, diet and exercise as appropriate   Family hx stroke (paternal grandfather)  Migraines  Other Active Problems  Chronic pain, Anxiety  Hypokalemia 3.1->4.5 - resolved  Hyponatremia sodium 135->131->134->132->134  Hospital day # 17  I had long discussion with fianc at bedside, updated pt current condition,  treatment plan and potential prognosis, and answered all the questions.  He expressed understanding and appreciation.    Marvel Plan, MD PhD Stroke Neurology 03/25/2020 11:56 AM   To contact Stroke Continuity provider, please refer to [email or web address]. After hours, contact General Neurology

## 2020-03-25 NOTE — Progress Notes (Signed)
Inpatient Rehab Admissions:  Inpatient Rehab Consult received.  I met with patient and her significant other, [name], at the bedside for rehabilitation assessment and to discuss goals and expectations of an inpatient rehab admission.  I was also able to speak to pt's son, [name], on the phone, and family is in agreement.  [name] states that he and pt's family are very interested in [place] for pt, so that she can be "as independent as possible." We discussed expected length of stay to be about three weeks, with supervision to min assist goals, and that it is possible pt may need to use a w/c for mobility.  I let them know that we would recommend someone to be with her 24/7 when she discharged, and they agree that she would not be left alone.  We discussed need for prior auth with [facility], and I will start that process today.  We will not have a bed for Ms. Gambone on rehab this weekend, but I will f/u with her and her family early next week once I hear back from insurance.    Signed: Shann Medal, PT, DPT Admissions Coordinator [phone] 03/25/20  2:54 PM

## 2020-03-25 NOTE — PMR Pre-admission (Addendum)
PMR Admission Coordinator Pre-Admission Assessment  Patient: Deborah [name] is an 49 y.o., female MRN: [id number] DOB: Jul 04, 1970 Height: 5' (152.4 cm) Weight: 90.6 kg              Insurance Information HMO:     PPO:      PCP:      IPA:      80/20:      OTHER:  PRIMARY: [employer]      Policy#: [id number]      Subscriber: pt CM Name: Almyra Free    Phone#: [phone] ext [phone]     Fax#: [phone] Pre-Cert#: [id number] approved for 7 days      Employer:  Benefits:  Phone #: [phone]     Name:  Eff. Date: active 12/17/2019     Deduct:     Out of Pocket Max:       Life Max:   CIR: 100%      SNF: 100% Outpatient: $3 Per visit     Co-Pay:  Home Health: 100%      Co-Pay:  DME: 100%     Co-Pay:  Providers: in network SECONDARY:       Policy#:       Phone#:   Development worker, community:       Phone#:   The Engineer, petroleum" for patients in Inpatient Rehabilitation Facilities with attached "Privacy Act [place] Records" was provided and verbally reviewed with: N/A  Emergency Contact Information Contact Information     Name Relation Home Work Mobile   Quincy, Perkins. Son   [phone]   Hollifield,Jason Significant other   [phone]   Deborah [name]   [phone]      Current Medical History  Patient Admitting Diagnosis: L MCA with post re-vascularization hemorrhage   History of Present Illness:  Deborah [name] is a 49 year old right-handed female history of diabetes mellitus hyperlipidemia and tobacco abuse.  She presented on 03/08/2020 with right side weakness and aphasia to an outside hospital.  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  Urine drug screen positive benzos and barbiturates.  CTA confirming a left M2 cutoff.  [facility] per interventional radiology.  MRI showed large territory infarct with moderate hemorrhage and mild subarachnoid hemorrhage.  Edema with mass-effect 7 mm right  side midline shift.  Echocardiogram with ejection fraction of 60 to 65% no wall motion abnormalities.  EEG negative for seizure.  She required intubation for extended period of time underwent tracheostomy 03/13/2020 per Dr. Ina Homes.  She was downsized to #4 cuffless trach 03/28/2020.  She is currently n.p.o. with alternative means of nutritional support.  Maintain on Plavix for CVA prophylaxis.  Subcutaneous Lovenox for DVT prophylaxis.  Bouts of urinary retention placed on low-dose Urecholine.    Complete NIHSS TOTAL: 16 Glasgow Coma Scale Score: 11  Past Medical History  Past Medical History:  Diagnosis Date   DM (diabetes mellitus), type 2 (HCC)    Hyperlipidemia    Hypersomnia    Sprain of right ankle, initial encounter    Syncope     Family History  family history includes Heart Problems in her father; Heart defect in her maternal grandmother and mother; Stroke in her paternal grandfather.  Prior Rehab/Hospitalizations:  Has the patient had prior rehab or hospitalizations prior to admission? No  Has the patient had major surgery during 100 days prior to admission? Yes  Current Medications   Current Facility-Administered Medications:  0.9 %  sodium chloride infusion, , Intravenous, Continuous, Rinehuls, Early Chars, PA-C, Last Rate: 50 mL/hr at 03/30/20 0409, New Bag at 03/30/20 0409   acetaminophen (TYLENOL) tablet 650 mg, 650 mg, Oral, Q4H PRN, 650 mg at 03/20/20 0948 **OR** acetaminophen (TYLENOL) 160 MG/5ML solution 650 mg, 650 mg, Per Tube, Q4H PRN, 650 mg at 03/27/20 1522 **OR** acetaminophen (TYLENOL) suppository 650 mg, 650 mg, Rectal, Q4H PRN, Bhagat, Srishti L, MD   atorvastatin (LIPITOR) tablet 80 mg, 80 mg, Per Tube, Daily, Rosalin Hawking, MD, 80 mg at 03/30/20 1112   bethanechol (URECHOLINE) tablet 10 mg, 10 mg, Per Tube, TID, Rosalin Hawking, MD, 10 mg at 03/30/20 1112   chlorhexidine gluconate (MEDLINE KIT) (PERIDEX) 0.12 % solution 15 mL, 15 mL, Mouth Rinse, BID,  Hunsucker, Bonna Gains, MD, 15 mL at 03/30/20 0810   Chlorhexidine Gluconate Cloth 2 % PADS 6 each, 6 each, Topical, Daily, Hunsucker, Bonna Gains, MD, 6 each at 03/29/20 1057   clopidogrel (PLAVIX) tablet 75 mg, 75 mg, Per Tube, Daily, Rosalin Hawking, MD, 75 mg at 03/30/20 1111   dextrose 50 % solution 0-50 mL, 0-50 mL, Intravenous, PRN, Bloomfield, Carley D, DO   docusate (COLACE) 50 MG/5ML liquid 100 mg, 100 mg, Per Tube, BID, Bloomfield, Carley D, DO, 100 mg at 03/28/20 2123   enoxaparin (LOVENOX) injection 40 mg, 40 mg, Subcutaneous, Q24H, Biby, Sharon L, NP, 40 mg at 03/29/20 1720   feeding supplement (GLUCERNA 1.5 CAL) liquid 1,000 mL, 1,000 mL, Per Tube, Continuous, Rosalin Hawking, MD, Last Rate: 45 mL/hr at 03/29/20 1130, 1,000 mL at 03/29/20 1130   feeding supplement (PROSource TF) liquid 45 mL, 45 mL, Per Tube, Daily, Rosalin Hawking, MD, 45 mL at 03/30/20 1112   insulin aspart (novoLOG) injection 0-15 Units, 0-15 Units, Subcutaneous, Q4H, Hunsucker, Bonna Gains, MD, 3 Units at 03/30/20 1201   insulin aspart (novoLOG) injection 10 Units, 10 Units, Subcutaneous, Q4H, Rosalin Hawking, MD, 10 Units at 03/30/20 1201   insulin glargine (LANTUS) injection 25 Units, 25 Units, Subcutaneous, BID, Rosalin Hawking, MD, 25 Units at 03/30/20 1112   labetalol (NORMODYNE) injection 5-20 mg, 5-20 mg, Intravenous, Q10 min PRN, Rosalin Hawking, MD   MEDLINE mouth rinse, 15 mL, Mouth Rinse, 10 times per day, Hunsucker, Bonna Gains, MD, 15 mL at 03/30/20 0405   multivitamin with minerals tablet 1 tablet, 1 tablet, Per Tube, Daily, Bloomfield, Carley D, DO, 1 tablet at 03/30/20 1112   polyethylene glycol (MIRALAX / GLYCOLAX) packet 17 g, 17 g, Per Tube, Daily, Bloomfield, Carley D, DO, 17 g at 03/28/20 0903   senna-docusate (Senokot-S) tablet 1 tablet, 1 tablet, Per Tube, QHS PRN, Levada Dy, Dwayne A, RPH  Patients Current Diet:  Diet Order             Diet NPO time specified  Diet effective midnight                    Precautions / Restrictions Precautions Precautions: Fall Precaution Comments: NG, R Hemi, +2, IV, trach collar Restrictions Weight Bearing Restrictions: No   Has the patient had 2 or more falls or a fall with injury in the past year?No  Prior Activity Level Limited Community (1-2x/wk): on partial disability due to kidney diseaes and diabetic glaucoma, but taking care of grandkids during the day, no DME at baseline  Prior Functional Level Prior Function Comments: Per palliative note, Pt was ambulatory and performing her own ADL  Self Care: Did the patient need help bathing,  dressing, using the toilet or eating?  Independent  Indoor Mobility: Did the patient need assistance with walking from room to room (with or without device)? Independent  Stairs: Did the patient need assistance with internal or external stairs (with or without device)? Independent  Functional Cognition: Did the patient need help planning regular tasks such as shopping or remembering to take medications? Independent  Home Assistive Devices / Equipment    Prior Device Use: Indicate devices/aids used by the patient prior to current illness, exacerbation or injury? None of the above  Current Functional Level Cognition  Arousal/Alertness: Awake/alert Overall Cognitive Status: Difficult to assess Difficult to assess due to: Impaired communication, Tracheostomy Current Attention Level: Sustained Orientation Level: Oriented to person Following Commands: Follows one step commands consistently, Follows one step commands with increased time Safety/Judgement: Decreased awareness of safety, Decreased awareness of deficits General Comments: Pt emotional during session, believed to be from frustration at her current functional status. Did not appear to be from pain. Pt able to look to therapist when her name was stated. Required continual cues to make pt aware of R LE and UE, with min to no success as pt continued to  move without acknowledging/assisting them. Pt impulsive and requires cues to maintain safety. Attention: Focused Focused Attention: Appears intact    Extremity Assessment (includes Sensation/Coordination)  Upper Extremity Assessment: RUE deficits/detail, LUE deficits/detail RUE Deficits / Details: flaccid RUE RUE Coordination: decreased fine motor, decreased gross motor LUE Deficits / Details: 3/5 gross strength LUE Coordination: decreased fine motor, decreased gross motor  Lower Extremity Assessment: Defer to PT evaluation RLE Deficits / Details: RLE is flaccid, withdraws to pain, PROM WFL, no AROM noted LLE Deficits / Details: ankle 3/5 PF/DF, 2-/5 knee extension and flexion, 2-/5 hip external and internal rotation, no active hip flexion/abduction/adduction noted    ADLs  Overall ADL's : Needs assistance/impaired Grooming: Maximal assistance, Total assistance, Cueing for sequencing, Bed level Grooming Details (indicate cue type and reason): hand over hand for hand to face, able to grasp wash cloth, and perseverating on washing eyes, no attention to R side of face. Toileting- Clothing Manipulation and Hygiene: Moderate assistance, +2 for physical assistance, +2 for safety/equipment, Cueing for sequencing, Cueing for safety, Bed level Toileting - Clothing Manipulation Details (indicate cue type and reason): Rolling to complete peri-care Functional mobility during ADLs: Maximal assistance, +2 for physical assistance, +2 for safety/equipment General ADL Comments: sat EOB about 15 minutes at close min guard level. Assist to position R side into supportive, weighbearing position.     Mobility  Overal bed mobility: Needs Assistance Bed Mobility: Supine to Sit, Rolling Rolling: Max assist Sidelying to sit: Max assist Supine to sit: Max assist Sit to supine: Total assist General bed mobility comments: partial roll to left side then up to EOB position. Assist to advance R side and power up  trunk.     Transfers  Overall transfer level: Needs assistance Equipment used: Ambulation equipment used Transfer via Lift Equipment: Stedy Transfers: Sit to/from Stand Sit to Stand: Min assist, Mod assist, +2 safety/equipment Stand pivot transfers: +2 safety/equipment General transfer comment: 3x from elevated EOB height. assist to powerup, steady and control descent. 1x to RW, 1x to posterior aspect of bedside chair, and 1x to stedy. TCs applied to R quad with R knee block on occasion to extend knee, with no activation noted from pt.    Ambulation / Gait / Stairs / Wheelchair Mobility  Ambulation/Gait Ambulation/Gait assistance:  (unable) General Gait Details:  unable    Posture / Balance Dynamic Sitting Balance Sitting balance - Comments: close min guard to sit with BUE and BLE supported Balance Overall balance assessment: Needs assistance Sitting-balance support: Feet supported, Single extremity supported, Bilateral upper extremity supported Sitting balance-Leahy Scale: Poor Sitting balance - Comments: close min guard to sit with BUE and BLE supported Postural control: Right lateral lean Standing balance support: Bilateral upper extremity supported Standing balance-Leahy Scale: Zero Standing balance comment: better control standing with use of stedy bars vs rw and assist. tends to lean excessively to R, requiring maxAx2 to maintain static standing balance with TCs at R quad to extend knee with R knee block. Pt stood 3x for ~10-30 sec each bout.     Special needs/care consideration Trach size #4 cuffless, Skin new trach and Diabetic management yes   Trach downsized last 10/11 Cortrak 43 inches right nare placed 9/24   Previous Home Environment  Living Arrangements: Spouse/significant other, Children Available Help at Discharge: Family, Available 24 hours/day Type of Home: Mobile home Home Layout: One level Home Access: Stairs to enter Entrance Stairs-Rails: Right,  Left Entrance Stairs-Number of Steps: ~7 Bathroom Shower/Tub: Multimedia programmer: Programmer, systems: No Home Care Services: No Additional Comments: difficult to determine, pt intubated, no family present  Discharge Living Setting Plans for Discharge Living Setting: Patient's home Type of Home at Discharge: Mobile home Discharge Home Layout: One level Discharge Home Access: Stairs to enter Entrance Stairs-Rails: Right, Left Entrance Stairs-Number of Steps: ~7 Discharge Bathroom Shower/Tub: Walk-in shower Discharge Bathroom Toilet: Standard Discharge Bathroom Accessibility: No Does the patient have any problems obtaining your medications?: No  Social/Family/Support Systems Patient Roles: Spouse Anticipated Caregiver: [name] (son), and [name] (s/o) Anticipated Caregiver's Contact Information: [name] [phone]; [name] [phone] Ability/Limitations of Caregiver: one works days, one works nights Caregiver Availability: 24/7 Discharge Plan Discussed with Primary Caregiver: Yes Is Caregiver In Agreement with Plan?: Yes Does Caregiver/Family have Issues with Lodging/Transportation while Pt is in Rehab?: No  Goals Patient/Family Goal for Rehab: PT/OT min/mod A, SLP min assist to mod assist Expected length of stay: 22-25 days Additional Information: cortrak, trach Pt/Family Agrees to Admission and willing to participate: Yes Program Orientation Provided & Reviewed with Pt/Caregiver Including Roles  & Responsibilities: Yes  Barriers to Discharge: Home environment access/layout, Therapist, nutritional, Insurance for SNF coverage   Decrease burden of Care through IP rehab admission: n/a  Possible need for SNF placement upon discharge: not anticipated  Patient Condition: This patient's medical and functional status has changed since the consult dated: 03/25/2020 in which the Rehabilitation Physician determined and documented that the patient's condition is appropriate for  intensive rehabilitative care in an inpatient rehabilitation facility. See "History of Present Illness" (above) for medical update. Functional changes are: mod to max assist overall. Patient's medical and functional status update has been discussed with the Rehabilitation physician and patient remains appropriate for inpatient rehabilitation. Will admit to inpatient rehab today.  Preadmission Screen Completed By: Shann Medal, PT, DPT with updates by  Cleatrice Burke, RN, 03/30/2020 12:11 PM ______________________________________________________________________   Discussed status with Dr. Posey Pronto on 03/30/2020 at  1211 and received approval for admission today.  Admission Coordinator: Shann Medal, PT, DPT  Cleatrice Burke, time 0034 Date 03/30/2020

## 2020-03-25 NOTE — Progress Notes (Signed)
Occupational Therapy Treatment Patient Details Name: Deborah [name] MRN: [id number] DOB: 1970-10-03 Today's Date: 03/25/2020    History of present illness 49 y/o female with history of IDDM, HLD, tobacco use disorder presented to [facility] for facial droop, right sided weakness and global aphasia. Found to have LVO of left M1 segment and was transferred to [facility]. Pt underwent left common carotid arteriogram followed by revascularization of L MCA, however it reoccluded due to sever distal M1 stenosis.Pt underwent trach on 9/26.   OT comments  Patient continues to make gradual progress towards goals in skilled OT session. Patient's session encompassed co-treat with PTA in order to address functional mobility, increased activity tolerance, balance, and bed mobility. Pt with increased flat affect today, requiring increased multi-modal cues as well as increased support for RUE and RLE. Pt able to complete 4 sit<>stand transfers with mod-max A and able to stand for 20-30 seconds each trial. Pt with need for increased time in order to follow one step commands, but 25% follow through. Discharge remains appropriate; will continue to follow acutely.    Follow Up Recommendations  SNF;Supervision/Assistance - 24 hour    Equipment Recommendations   (defer to next venue)    Recommendations for Other Services      Precautions / Restrictions Precautions Precautions: Fall Precaution Comments: NG, R Hemi, +2, IV, trach collar Restrictions Weight Bearing Restrictions: No       Mobility Bed Mobility Overal bed mobility: Needs Assistance Bed Mobility: Supine to Sit;Rolling Rolling: +2 for physical assistance;Mod assist Sidelying to sit: Mod assist;+2 for physical assistance;+2 for safety/equipment;HOB elevated       General bed mobility comments: Despite HOB elevated, requiring multi-modal cues to bring BLEs off the bed, able to reach out to grab therapist's hand to come to sitting, but  requiring support at trunk x2 to come to full upright position  Transfers Overall transfer level: Needs assistance Equipment used: Ambulation equipment used Transfers: Sit to/from Stand Sit to Stand: +2 physical assistance;+2 safety/equipment;Mod assist         General transfer comment: Increased need for VC's and assist on RLE and RUE in order to pull to stand, pt often attempting to stand pre-emptively and demonstrated decreased trunk control on R side, able to complete 4 sit<>stands, but fatigued quickly    Balance Overall balance assessment: Needs assistance Sitting-balance support: Bilateral upper extremity supported;Feet supported;Feet unsupported Sitting balance-Leahy Scale: Poor Sitting balance - Comments: Min guard for static sitting EOB with UE support for 3-5 seconds at best, no pushing noted with increased multi-modal cues required to maintain midline Postural control: Right lateral lean Standing balance support: Bilateral upper extremity supported Standing balance-Leahy Scale: Poor Standing balance comment: Static standing with stedy x4 in session, mod A required on R in order to maintain trunk engagement and upright posture. Increased fatigue noted in session, as pt only able to withstand 20 seconds with each trial.                           ADL either performed or assessed with clinical judgement   ADL Overall ADL's : Needs assistance/impaired                             Toileting- Clothing Manipulation and Hygiene: Moderate assistance;+2 for physical assistance;+2 for safety/equipment;Cueing for sequencing;Cueing for safety;Bed level Toileting - Clothing Manipulation Details (indicate cue type and reason): Rolling to complete peri-care  Functional mobility during ADLs: Maximal assistance;+2 for physical assistance;+2 for safety/equipment General ADL Comments: Pt continues to demonstrate a delay in processing, but adamantly showing therapists  her composite flexion and extension with the LUE     Vision       Perception     Praxis      Cognition Arousal/Alertness: Awake/alert Behavior During Therapy: WFL for tasks assessed/performed Overall Cognitive Status: Impaired/Different from baseline Area of Impairment: Attention;Following commands;Safety/judgement;Awareness                   Current Attention Level: Selective   Following Commands: Follows one step commands inconsistently;Follows one step commands with increased time Safety/Judgement: Decreased awareness of safety;Decreased awareness of deficits   Problem Solving: Slow processing;Decreased initiation;Difficulty sequencing;Requires verbal cues;Requires tactile cues General Comments: Decreased ability to follow one step commands to date, requiring multi-modal cues and reorienting to task        Exercises     Shoulder Instructions       General Comments      Pertinent Vitals/ Pain       Pain Assessment: Faces Faces Pain Scale: No hurt  Home Living                                          Prior Functioning/Environment              Frequency  Min 2X/week        Progress Toward Goals  OT Goals(current goals can now be found in the care plan section)  Progress towards OT goals: Progressing toward goals  Acute Rehab OT Goals Patient Stated Goal: slowed progress towards goals  Plan Discharge plan remains appropriate    Co-evaluation    PT/OT/SLP Co-Evaluation/Treatment: Yes Reason for Co-Treatment: Complexity of the patient's impairments (multi-system involvement);Necessary to address cognition/behavior during functional activity;For patient/therapist safety PT goals addressed during session: Mobility/safety with mobility;Balance;Proper use of DME;Strengthening/ROM OT goals addressed during session: ADL's and self-care;Proper use of Adaptive equipment and DME;Strengthening/ROM      AM-PAC OT "6 Clicks" Daily  Activity     Outcome Measure   Help from another person eating meals?: Total Help from another person taking care of personal grooming?: A Lot Help from another person toileting, which includes using toliet, bedpan, or urinal?: Total Help from another person bathing (including washing, rinsing, drying)?: Total Help from another person to put on and taking off regular upper body clothing?: Total Help from another person to put on and taking off regular lower body clothing?: Total 6 Click Score: 7    End of Session Equipment Utilized During Treatment: Gait belt;Other (comment) (stedy)  OT Visit Diagnosis: Muscle weakness (generalized) (M62.81);Hemiplegia and hemiparesis;Cognitive communication deficit (R41.841) Hemiplegia - Right/Left: Right Hemiplegia - dominant/non-dominant: Dominant Hemiplegia - caused by: Cerebral infarction   Activity Tolerance Patient limited by fatigue   Patient Left in chair;with call bell/phone within reach;with chair alarm set   Nurse Communication Mobility status        Time: 7510-2585 OT Time Calculation (min): 29 min  Charges: OT General Charges $OT Visit: 1 Visit OT Treatments $Self Care/Home Management : 8-22 mins  Pollyann Glen E. Betheny Suchecki, COTA/L Acute Rehabilitation Services [phone]   Cherlyn Cushing 03/25/2020, 11:08 AM

## 2020-03-26 DIAGNOSIS — I63512 Cerebral infarction due to unspecified occlusion or stenosis of left middle cerebral artery: Secondary | ICD-10-CM | POA: Diagnosis not present

## 2020-03-26 LAB — LUPUS ANTICOAGULANT PANEL
DRVVT: 44.9 s (ref 0.0–47.0)
PTT Lupus Anticoagulant: 34.5 s (ref 0.0–51.9)

## 2020-03-26 LAB — CBC
HCT: 31.3 % — ABNORMAL LOW (ref 36.0–46.0)
Hemoglobin: 10.1 g/dL — ABNORMAL LOW (ref 12.0–15.0)
MCH: 32 pg (ref 26.0–34.0)
MCHC: 32.3 g/dL (ref 30.0–36.0)
MCV: 99.1 fL (ref 80.0–100.0)
Platelets: 481 10*3/uL — ABNORMAL HIGH (ref 150–400)
RBC: 3.16 MIL/uL — ABNORMAL LOW (ref 3.87–5.11)
RDW: 13.9 % (ref 11.5–15.5)
WBC: 14.2 10*3/uL — ABNORMAL HIGH (ref 4.0–10.5)
nRBC: 0 % (ref 0.0–0.2)

## 2020-03-26 LAB — HOMOCYSTEINE: Homocysteine: 22.1 umol/L — ABNORMAL HIGH (ref 0.0–14.5)

## 2020-03-26 LAB — GLUCOSE, CAPILLARY
Glucose-Capillary: 116 mg/dL — ABNORMAL HIGH (ref 70–99)
Glucose-Capillary: 123 mg/dL — ABNORMAL HIGH (ref 70–99)
Glucose-Capillary: 132 mg/dL — ABNORMAL HIGH (ref 70–99)
Glucose-Capillary: 134 mg/dL — ABNORMAL HIGH (ref 70–99)
Glucose-Capillary: 136 mg/dL — ABNORMAL HIGH (ref 70–99)
Glucose-Capillary: 157 mg/dL — ABNORMAL HIGH (ref 70–99)
Glucose-Capillary: 171 mg/dL — ABNORMAL HIGH (ref 70–99)

## 2020-03-26 LAB — VITAMIN B12: Vitamin B-12: 257 pg/mL (ref 180–914)

## 2020-03-26 LAB — BASIC METABOLIC PANEL WITH GFR
Anion gap: 12 (ref 5–15)
BUN: 25 mg/dL — ABNORMAL HIGH (ref 6–20)
CO2: 25 mmol/L (ref 22–32)
Calcium: 8.9 mg/dL (ref 8.9–10.3)
Chloride: 96 mmol/L — ABNORMAL LOW (ref 98–111)
Creatinine, Ser: 0.72 mg/dL (ref 0.44–1.00)
GFR, Estimated: >60 mL/min
Glucose, Bld: 136 mg/dL — ABNORMAL HIGH (ref 70–99)
Potassium: 4 mmol/L (ref 3.5–5.1)
Sodium: 133 mmol/L — ABNORMAL LOW (ref 135–145)

## 2020-03-26 LAB — PROTEIN C, TOTAL: Protein C, Total: 125 % (ref 60–150)

## 2020-03-26 LAB — PROTEIN S, TOTAL: Protein S Ag, Total: 133 % (ref 60–150)

## 2020-03-26 LAB — RPR: RPR Ser Ql: NONREACTIVE

## 2020-03-26 LAB — PROTEIN C ACTIVITY: Protein C Activity: 160 % (ref 73–180)

## 2020-03-26 LAB — TSH: TSH: 3.334 u[IU]/mL (ref 0.350–4.500)

## 2020-03-26 LAB — PROTEIN S ACTIVITY: Protein S Activity: 110 % (ref 63–140)

## 2020-03-26 NOTE — Progress Notes (Signed)
STROKE TEAM PROGRESS NOTE   INTERVAL HISTORY No family present. Awake , alert, aphasic but will follow simple commands.  Vital signs stable.  Vitals:   03/26/20 0757 03/26/20 0824 03/26/20 1121 03/26/20 1145  BP:  (!) 100/49  117/65  Pulse: 87 77 85 88  Resp: 19 13 19    Temp:  98.5 F (36.9 C)  98.5 F (36.9 C)  TempSrc:  Oral  Oral  SpO2: 96%  97%   Weight:      Height:       CBC:  Recent Labs  Lab 03/25/20 0447 03/26/20 0108  WBC 14.2* 14.2*  HGB 10.5* 10.1*  HCT 32.4* 31.3*  MCV 97.9 99.1  PLT 483* 481*   Basic Metabolic Panel:  Recent Labs  Lab 03/25/20 0447 03/26/20 0108  NA 134* 133*  K 4.2 4.0  CL 96* 96*  CO2 26 25  GLUCOSE 95 136*  BUN 26* 25*  CREATININE 0.76 0.72  CALCIUM 9.2 8.9   IMAGING past 24 hours No results found.   PHYSICAL EXAM    Temp:  [98.3 F (36.8 C)-99.2 F (37.3 C)] 98.5 F (36.9 C) (10/09 1145) Pulse Rate:  [71-89] 88 (10/09 1145) Resp:  [12-20] 19 (10/09 1121) BP: (100-117)/(49-65) 117/65 (10/09 1145) SpO2:  [93 %-100 %] 97 % (10/09 1121) FiO2 (%):  [28 %] 28 % (10/09 1121)  General - Well nourished, well developed, in no apparent distress, on trach collar.  Ophthalmologic - fundi not visualized due to noncooperation.  Cardiovascular - Regular rhythm and rate.  Neuro - awake alert, eyes open, on trach collar, nonverbal, able to track bilaterally, no gaze deviation but left gaze preference, not blinking to visual threat on the right.  Able to follow simple midline one-step commands not able to follow peripheral commands.  Right facial droop, tongue protrusion not corporative.  Left upper extremity at least 4/5.  Left lower extremity at least 3/5.  Right upper extremity flaccid.  Right lower extremity slight withdraw to pain.  Right Babinski positive. Sensation, coordination not corporative and gait not tested.   ASSESSMENT/PLAN [name] is a 49 y.o. female with history of difficulty to control DB, HLD,  syncope presenting to [facility] with R hemiparesis, facial droop, left gaze, and global aphasia w/ glucose 400s where she was found to have L M1 occlusion and transferred to [facility] for IR.   Stroke:   L MCA infarct s/p IR w/ post procedure hemorrhage, infarct secondary to large vessel disease source  CT head [facility]) L MCA infarct w/ ASPECTS 6  CTA head & neck [facility]) L M2 cutoff  CT head L MCA territory infarct. ASPECTS 6.   CT perfusion 63 core L brain same as CT. 32 penumbra. 1.5 mismatch ratio.  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 Post IR hemorrhage.  CT head 9/21 1818 increased L MCA ischemic infarct w/ mass effect and partial effacement L lateral ventrilc w/ 2mm midline shift. Small foci L BG HT same w/ increased L perisylvian and L cerebral convexity hemorrhage.   CT head 9/22 0151 evolution large L MCA infarct w/ slightly worse effacement and mass effect w/ 4mm midline shift. Scattered foci hemorrhage in infarct same w/ contrast clearing. L M1 and proximal L MCA branches w/ persistent hyperdensity.   CT head 9/22 0827  L MCA infarct and hemorrhages same. Increase in mass effect and effacement  now 20mm midline shift. L M1 and proximal L MCA braches w/ persistent hyperdensity.   MRI 9/22 Large L MCA infarct w/ moderate infarct and SAH. Edema, 38mm midline shift.   MRA 9/22 L MCA 1 occlusion   CT head 9/24 L MCA infarct w/ mild increase in edema, 5mm midline shift    CT repeat 9/26 - Stable extent of left MCA territory infarct with small volume blood products. Midline shift continues to measure 9 mm.  2D Echo EF 60-65%. No source of embolus   Recent Zio patch Normal w/ infrequent PACs, PVCs 01/07/2020  May consider further cardioembolic work up if further neuro improvement  LDL 174   HgbA1c 10.7   B12/TSH/RPR/ANA pending  Hypercoagulable work  up pending - Antithrombin III mildly elevated - 122  VTE prophylaxis - Lovenox 40 mg sq daily     clopidogrel 75 mg daily prior to admission, now on plavix for stroke prevention (allergic to ASA).    Therapy recommendations:  CIR  Disposition:  pending  Acute Respiratory Failure  Secondary to stroke  Intubated for IR, left intubated post IR    Off sedation  CCM on board   Trach placed 03/13/20 - on trach collar  Has size 6 cuffed trach. Will need to go to cuffless prior to d/c. CCM following to downsize, plans for next Monday 10/11. Janina Mayo must be at least 4d old if for SNF admission)  CCM on board for trach management     Cerebral Edema  MRI 9/22 with MLS @ 74mm  CT head 9/24 L MCA infarct w/ mild increase in edema, 19mm midline shift    CT repeat 9/26 - Stable extent of left MCA territory infarct with small volume blood products. Midline shift continues to measure 9 mm.  On 3% protocol - @75 ->50->NS-> off  23.4% x 1 (9/25)  Leukocytosis   afebrile  WBC 17.4->18->16.8->16.3->15.7->14.2->14.2 (afebrile)  UA - WBC 11-20  CXR - bibasilar atx  Urine culture insignificant growth  Increased trach secretions - PRN suctioning  Finished Rocephin 5 day course  Hyperlipidemia  Home meds:  lipitor 20  LDL 174, goal < 70  On lipitor 80  Continue statin at discharge  Diabetes type II Uncontrolled  Home meds:  Glimepiride , lantus 21, liraglutide 1.8  HgbA1c 10.7, goal < 7.0  CBGs  Hyperglycemia much improved  SSI 0-15  On lantus 28 -> 35 -> 25 bid ->30 bid->25 bid  novolog premeal 3U Q4 -> 5U Q4 -> 8U Q4->16U Q4->10U Q4  Changed TF formula from Jevity to Glucerna  Dysphagia . Secondary to stroke . NPO except meds . on TF @ 47 via cortrak  . On IVF @ 27 . On FW flush 60cc Q4 -> 20cc Q4 . MBS 10/6 - continue NPO and meds via alternative means.  09-29-1970 Speech on board  Tobacco abuse  Current smoker  Will provide smoking cessation when able     Other Stroke Risk Factors  UDS positive benzos and barb (done following IR procedure)   Obesity, Body mass index is 40.82 kg/m., recommend weight loss, diet and exercise as appropriate   Family hx stroke (paternal grandfather)  Migraines  Other Active Problems  Chronic pain, Anxiety  Hypokalemia 3.1->4.5 - resolved - 4.0  Hyponatremia sodium 135->131->134->132->134->133   Anemia - Hgb - 10.1  SBP mildly low - 100 - 117 -> increase IV fluid to 50 cc / hr (EF 60 - 65%)   Hospital day # 18  Continue ongoing management.  Speech therapy to continues swallow eval.  Hopefully transfer to rehab once she is able to swallow and has a PEG tube next week.  Greater than 50% time during this 25-minute visit was spent on counseling and coordination of care and discussion with care team. Delia Heady, MD     To contact Stroke Continuity provider, please refer to [email or web address]. After hours, contact General Neurology

## 2020-03-26 NOTE — Progress Notes (Signed)
During routine trach check patient was noted to have blood tinged secretions, which were not noted earlier in the day. Patient has a strong productive cough. RT will continue to monitor.  RN notified provider.

## 2020-03-26 NOTE — Progress Notes (Signed)
RN and RT assessed patient and noticed some blood tinged sputum from trach. MD Pearlean Brownie made aware. RT recommend less frequent suctioning. Will pass on to night shift RN.

## 2020-03-26 NOTE — Progress Notes (Signed)
Physical Therapy Treatment Patient Details Name: Deborah [name] MRN: [id number] DOB: Jul 02, 1970 Today's Date: 03/26/2020    History of Present Illness 49 y/o female with history of IDDM, HLD, tobacco use disorder presented to [facility] for facial droop, right sided weakness and global aphasia. Found to have LVO of left M1 segment and was transferred to [facility]. Pt underwent left common carotid arteriogram followed by revascularization of L MCA, however it reoccluded due to sever distal M1 stenosis.Pt underwent trach on 9/26.    PT Comments    Pt supine in bed on arrival this session.  She is very flat but does participate with multimodal cues.  Performed standing trials x2 with emphasis on lateral weight shifting.  Her standing tolerance remains poor.  Plan for continued balance training in standing.  Pt continues to benefit from skilled rehab in a post acute setting to maximize function gains before returning home.     Follow Up Recommendations  CIR     Equipment Recommendations  Wheelchair (measurements PT);Wheelchair cushion (measurements PT);Hospital bed;3in1 (PT)    Recommendations for Other Services Rehab consult     Precautions / Restrictions Precautions Precautions: Fall Precaution Comments: NG, R Hemi, +2, IV, trach collar Restrictions Weight Bearing Restrictions: No    Mobility  Bed Mobility Overal bed mobility: Needs Assistance Bed Mobility: Sit to Sidelying;Rolling;Sidelying to Sit Rolling: Max assist;Total assist (Max to R and total assistance to the L.) Sidelying to sit: Max assist   Sit to supine: Total assist   General bed mobility comments: Max-total assistance to move from supine to sidelying to sit.  Pt required assistance to roll, advance LEs and elevate trunk into a seated position.  Transfers Overall transfer level: Needs assistance Equipment used: Ambulation equipment used (sara stedy) Transfers: Sit to/from Stand Sit to Stand: Mod assist;Max  assist         General transfer comment: Pt able to utilize L side to rise into standing. Performed x2 bouts of standing.  Pt leans to the R in standing.  Cues for lateral weight shifting. PTA facilitating RUE support and posture.  Ambulation/Gait Ambulation/Gait assistance:  (unable)               Stairs             Wheelchair Mobility    Modified Rankin (Stroke Patients Only)       Balance Overall balance assessment: Needs assistance Sitting-balance support: Bilateral upper extremity supported;Feet supported Sitting balance-Leahy Scale: Poor Sitting balance - Comments: Min guard for static sitting EOB with UE support for 3-5 seconds before requiring min-maxA to maintain balance, no pushing noted with increased multi-modal cues required to maintain midline     Standing balance-Leahy Scale: Poor Standing balance comment: Poor tolerance to standing with reliance on L side.                            Cognition Arousal/Alertness: Awake/alert Behavior During Therapy: Flat affect Overall Cognitive Status: Impaired/Different from baseline Area of Impairment: Following commands;Problem solving;Attention;Safety/judgement                   Current Attention Level: Selective   Following Commands: Follows one step commands inconsistently;Follows one step commands with increased time Safety/Judgement: Decreased awareness of safety;Decreased awareness of deficits   Problem Solving: Slow processing;Decreased initiation;Difficulty sequencing;Requires verbal cues;Requires tactile cues General Comments: Decreased ability to follow one step commands to date, requiring multi-modal cues and reorienting to task  Exercises      General Comments        Pertinent Vitals/Pain Pain Assessment: Faces Faces Pain Scale: No hurt Pain Location: generalized response to noxious stimuli Pain Descriptors / Indicators: Grimacing Pain Intervention(s): Monitored  during session;Repositioned    Home Living                      Prior Function            PT Goals (current goals can now be found in the care plan section) Acute Rehab PT Goals Patient Stated Goal: slowed progress towards goals Potential to Achieve Goals: Fair Progress towards PT goals: Progressing toward goals    Frequency    Min 4X/week      PT Plan Current plan remains appropriate    Co-evaluation              AM-PAC PT "6 Clicks" Mobility   Outcome Measure  Help needed turning from your back to your side while in a flat bed without using bedrails?: Total Help needed moving from lying on your back to sitting on the side of a flat bed without using bedrails?: Total Help needed moving to and from a bed to a chair (including a wheelchair)?: Total Help needed standing up from a chair using your arms (e.g., wheelchair or bedside chair)?: Total Help needed to walk in hospital room?: Total Help needed climbing 3-5 steps with a railing? : Total 6 Click Score: 6    End of Session Equipment Utilized During Treatment: Gait belt;Oxygen Activity Tolerance: Patient limited by fatigue Patient left: in chair;with call bell/phone within reach;with chair alarm set Nurse Communication: Mobility status PT Visit Diagnosis: Unsteadiness on feet (R26.81);Muscle weakness (generalized) (M62.81);Difficulty in walking, not elsewhere classified (R26.2);Other symptoms and signs involving the nervous system (R29.898);Hemiplegia and hemiparesis Hemiplegia - Right/Left: Right Hemiplegia - dominant/non-dominant:  (unable to determine) Hemiplegia - caused by: Cerebral infarction     Time: 2426-8341 PT Time Calculation (min) (ACUTE ONLY): 23 min  Charges:  $Therapeutic Activity: 23-37 mins                     Bonney Leitz , PTA Acute Rehabilitation Services Pager [phone] Office [phone]     Finnley Lewis Artis Delay 03/26/2020, 2:19 PM

## 2020-03-27 DIAGNOSIS — I63512 Cerebral infarction due to unspecified occlusion or stenosis of left middle cerebral artery: Secondary | ICD-10-CM | POA: Diagnosis not present

## 2020-03-27 LAB — BASIC METABOLIC PANEL
Anion gap: 10 (ref 5–15)
BUN: 21 mg/dL — ABNORMAL HIGH (ref 6–20)
CO2: 24 mmol/L (ref 22–32)
Calcium: 8.8 mg/dL — ABNORMAL LOW (ref 8.9–10.3)
Chloride: 98 mmol/L (ref 98–111)
Creatinine, Ser: 0.7 mg/dL (ref 0.44–1.00)
GFR, Estimated: >60 mL/min (ref >60)
Glucose, Bld: 179 mg/dL — ABNORMAL HIGH (ref 70–99)
Potassium: 4.8 mmol/L (ref 3.5–5.1)
Sodium: 132 mmol/L — ABNORMAL LOW (ref 135–145)

## 2020-03-27 LAB — GLUCOSE, CAPILLARY
Glucose-Capillary: 109 mg/dL — ABNORMAL HIGH (ref 70–99)
Glucose-Capillary: 120 mg/dL — ABNORMAL HIGH (ref 70–99)
Glucose-Capillary: 156 mg/dL — ABNORMAL HIGH (ref 70–99)
Glucose-Capillary: 175 mg/dL — ABNORMAL HIGH (ref 70–99)
Glucose-Capillary: 181 mg/dL — ABNORMAL HIGH (ref 70–99)
Glucose-Capillary: 195 mg/dL — ABNORMAL HIGH (ref 70–99)
Glucose-Capillary: 76 mg/dL (ref 70–99)

## 2020-03-27 LAB — CBC
HCT: 33.6 % — ABNORMAL LOW (ref 36.0–46.0)
Hemoglobin: 10.3 g/dL — ABNORMAL LOW (ref 12.0–15.0)
MCH: 31.9 pg (ref 26.0–34.0)
MCHC: 30.7 g/dL (ref 30.0–36.0)
MCV: 104 fL — ABNORMAL HIGH (ref 80.0–100.0)
Platelets: 387 10*3/uL (ref 150–400)
RBC: 3.23 MIL/uL — ABNORMAL LOW (ref 3.87–5.11)
RDW: 14 % (ref 11.5–15.5)
WBC: 14.4 10*3/uL — ABNORMAL HIGH (ref 4.0–10.5)
nRBC: 0.1 % (ref 0.0–0.2)

## 2020-03-27 LAB — CARDIOLIPIN ANTIBODIES, IGG, IGM, IGA
Anticardiolipin IgA: <9 APL U/mL (ref 0–11)
Anticardiolipin IgG: <9 GPL U/mL (ref 0–14)
Anticardiolipin IgM: <9 MPL U/mL (ref 0–12)

## 2020-03-27 LAB — BETA-2-GLYCOPROTEIN I ABS, IGG/M/A
Beta-2 Glyco I IgG: <9 GPI IgG units (ref 0–20)
Beta-2-Glycoprotein I IgA: <9 GPI IgA units (ref 0–25)
Beta-2-Glycoprotein I IgM: <9 GPI IgM units (ref 0–32)

## 2020-03-27 NOTE — Progress Notes (Signed)
STROKE TEAM PROGRESS NOTE   INTERVAL HISTORY No family present. Awake , alert, aphasic but will follow simple commands.  Vital signs stable.  Neurological exam is unchanged.  WBC count stable at 14.4.  No significant trach secretions.  No respiratory distress.  Vitals:   03/27/20 0017 03/27/20 0026 03/27/20 0321 03/27/20 0348  BP: 108/62   (!) 97/48  Pulse: 69 68 80 80  Resp: 17 18 16 17   Temp: 98.2 F (36.8 C)   98.2 F (36.8 C)  TempSrc: Oral   Oral  SpO2: 100% 99% 98% 99%  Weight:    94.5 kg  Height:       CBC:  Recent Labs  Lab 03/25/20 0447 03/26/20 0108  WBC 14.2* 14.2*  HGB 10.5* 10.1*  HCT 32.4* 31.3*  MCV 97.9 99.1  PLT 483* 481*   Basic Metabolic Panel:  Recent Labs  Lab 03/25/20 0447 03/26/20 0108  NA 134* 133*  K 4.2 4.0  CL 96* 96*  CO2 26 25  GLUCOSE 95 136*  BUN 26* 25*  CREATININE 0.76 0.72  CALCIUM 9.2 8.9   IMAGING past 24 hours No results found.   PHYSICAL EXAM    Temp:  [98.2 F (36.8 C)-98.9 F (37.2 C)] 98.2 F (36.8 C) (10/10 0348) Pulse Rate:  [68-89] 80 (10/10 0348) Resp:  [13-20] 17 (10/10 0348) BP: (97-117)/(48-65) 97/48 (10/10 0348) SpO2:  [96 %-100 %] 99 % (10/10 0348) FiO2 (%):  [28 %] 28 % (10/10 0321) Weight:  [94.5 kg] 94.5 kg (10/10 0348)  General - Well nourished, well developed, in no apparent distress, on trach collar.  Ophthalmologic - fundi not visualized due to noncooperation.  Cardiovascular - Regular rhythm and rate.  Neuro - awake alert, eyes open, on trach collar, nonverbal, able to track bilaterally, no gaze deviation but left gaze preference, not blinking to visual threat on the right.  Able to follow simple midline one-step commands not able to follow peripheral commands.  Right facial droop, tongue protrusion not corporative.  Left upper extremity at least 4/5.  Left lower extremity at least 3/5.  Right upper extremity flaccid.  Right lower extremity slight withdraw to pain.  Right Babinski positive.  Sensation, coordination not corporative and gait not tested.   ASSESSMENT/PLAN Deborah [name] is a 49 y.o. female with history of difficulty to control DB, HLD, syncope presenting to [facility] with R hemiparesis, facial droop, left gaze, and global aphasia w/ glucose 400s where she was found to have L M1 occlusion and transferred to [facility] for IR.   Stroke:   L MCA infarct s/p IR w/ post procedure hemorrhage, infarct secondary to large vessel disease source  CT head [facility]) L MCA infarct w/ ASPECTS 6  CTA head & neck [facility]) L M2 cutoff  CT head L MCA territory infarct. ASPECTS 6.   CT perfusion 63 core L brain same as CT. 32 penumbra. 1.5 mismatch ratio.  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 Post IR hemorrhage.  CT head 9/21 1818 increased L MCA ischemic infarct w/ mass effect and partial effacement L lateral ventrilc w/ 49mm midline shift. Small foci L BG HT same w/ increased L perisylvian and L cerebral convexity hemorrhage.   CT head 9/22 0151 evolution large L MCA infarct w/ slightly worse effacement and mass effect w/ 94mm midline shift. Scattered foci hemorrhage in infarct same w/ contrast clearing.  L M1 and proximal L MCA branches w/ persistent hyperdensity.   CT head 9/22 0827  L MCA infarct and hemorrhages same. Increase in mass effect and effacement now 24mm midline shift. L M1 and proximal L MCA braches w/ persistent hyperdensity.   MRI 9/22 Large L MCA infarct w/ moderate infarct and SAH. Edema, 28mm midline shift.   MRA 9/22 L MCA 1 occlusion   CT head 9/24 L MCA infarct w/ mild increase in edema, 68mm midline shift    CT repeat 9/26 - Stable extent of left MCA territory infarct with small volume blood products. Midline shift continues to measure 9 mm.  2D Echo EF 60-65%. No source of embolus   Recent Zio patch Normal w/ infrequent  PACs, PVCs 01/07/2020  May consider further cardioembolic work up if further neuro improvement  LDL 174   HgbA1c 10.7   B12/TSH/RPR/ANA pending  Hypercoagulable work up pending - Antithrombin III mildly elevated - 122  VTE prophylaxis - Lovenox 40 mg sq daily     clopidogrel 75 mg daily prior to admission, now on plavix for stroke prevention (allergic to ASA).    Therapy recommendations:  CIR  Disposition:  pending  Acute Respiratory Failure  Secondary to stroke  Intubated for IR, left intubated post IR    Off sedation  CCM on board   Trach placed 03/13/20 - on trach collar  Has size 6 cuffed trach. Will need to go to cuffless prior to d/c. CCM following to downsize, plans for next Monday 10/11. Janina Mayo must be at least 42d old if for SNF admission)  CCM on board for trach management     Cerebral Edema  MRI 9/22 with MLS @ 78mm  CT head 9/24 L MCA infarct w/ mild increase in edema, 63mm midline shift    CT repeat 9/26 - Stable extent of left MCA territory infarct with small volume blood products. Midline shift continues to measure 9 mm.  On 3% protocol - @75 ->50->NS-> off  23.4% x 1 (9/25)  Leukocytosis   afebrile  WBC 17.4->18->16.8->16.3->15.7->14.2->14.2 (afebrile)  UA - WBC 11-20  CXR - bibasilar atx  Urine culture insignificant growth  Increased trach secretions - PRN suctioning  Finished Rocephin 5 day course  Hyperlipidemia  Home meds:  lipitor 20  LDL 174, goal < 70  On lipitor 80  Continue statin at discharge  Diabetes type II Uncontrolled  Home meds:  Glimepiride , lantus 21, liraglutide 1.8  HgbA1c 10.7, goal < 7.0  CBGs  Hyperglycemia much improved  SSI 0-15  On lantus 28 -> 35 -> 25 bid ->30 bid->25 bid  novolog premeal 3U Q4 -> 5U Q4 -> 8U Q4->16U Q4->10U Q4  Changed TF formula from Jevity to Glucerna  Dysphagia . Secondary to stroke . NPO except meds . on TF @ 45 via cortrak  . On IVF @ 27 . On FW flush 60cc Q4  -> 20cc Q4 . MBS 10/6 - continue NPO and meds via alternative means.  09-29-1970 Speech on board  Tobacco abuse  Current smoker  Will provide smoking cessation when able    Other Stroke Risk Factors  UDS positive benzos and barb (done following IR procedure)   Obesity, Body mass index is 40.69 kg/m., recommend weight loss, diet and exercise as appropriate   Family hx stroke (paternal grandfather)  Migraines  Other Active Problems  Chronic pain, Anxiety  Hypokalemia 3.1->4.5 - resolved - 4.0  Hyponatremia sodium 135->131->134->132->134->133   Anemia - Hgb -  10.1  SBP mildly low - 100 - 117 -> increase IV fluid to 50 cc / hr (EF 60 - 65%)   Hospital day # 19  Continue ongoing medical management.  Await rehab decision. Delia Heady, MD     To contact Stroke Continuity provider, please refer to [email or web address]. After hours, contact General Neurology

## 2020-03-27 NOTE — Plan of Care (Signed)
  Problem: Education: Goal: Knowledge of General Education information will improve Description: Including pain rating scale, medication(s)/side effects and non-pharmacologic comfort measures Outcome: Progressing   Problem: Health Behavior/Discharge Planning: Goal: Ability to manage health-related needs will improve Outcome: Progressing   Problem: Clinical Measurements: Goal: Ability to maintain clinical measurements within normal limits will improve Outcome: Progressing Goal: Will remain free from infection Outcome: Progressing Goal: Diagnostic test results will improve Outcome: Progressing Goal: Respiratory complications will improve Outcome: Progressing Goal: Cardiovascular complication will be avoided Outcome: Progressing   Problem: Activity: Goal: Risk for activity intolerance will decrease Outcome: Progressing   Problem: Nutrition: Goal: Adequate nutrition will be maintained Outcome: Progressing   Problem: Coping: Goal: Level of anxiety will decrease Outcome: Progressing   Problem: Elimination: Goal: Will not experience complications related to bowel motility Outcome: Progressing Goal: Will not experience complications related to urinary retention Outcome: Progressing   Problem: Safety: Goal: Ability to remain free from injury will improve Outcome: Progressing   Problem: Skin Integrity: Goal: Risk for impaired skin integrity will decrease Outcome: Progressing   Problem: Education: Goal: Knowledge of disease or condition will improve Outcome: Progressing Goal: Knowledge of secondary prevention will improve Outcome: Progressing Goal: Knowledge of patient specific risk factors addressed and post discharge goals established will improve Outcome: Progressing Goal: Individualized Educational Video(s) Outcome: Progressing   Problem: Coping: Goal: Will verbalize positive feelings about self Outcome: Progressing Goal: Will identify appropriate support  needs Outcome: Progressing   Problem: Health Behavior/Discharge Planning: Goal: Ability to manage health-related needs will improve Outcome: Progressing   Problem: Self-Care: Goal: Ability to participate in self-care as condition permits will improve Outcome: Progressing Goal: Verbalization of feelings and concerns over difficulty with self-care will improve Outcome: Progressing Goal: Ability to communicate needs accurately will improve Outcome: Progressing   Problem: Nutrition: Goal: Risk of aspiration will decrease Outcome: Progressing Goal: Dietary intake will improve Outcome: Progressing   Problem: Ischemic Stroke/TIA Tissue Perfusion: Goal: Complications of ischemic stroke/TIA will be minimized Outcome: Progressing   

## 2020-03-28 DIAGNOSIS — Z430 Encounter for attention to tracheostomy: Secondary | ICD-10-CM | POA: Diagnosis not present

## 2020-03-28 DIAGNOSIS — I63311 Cerebral infarction due to thrombosis of right middle cerebral artery: Secondary | ICD-10-CM | POA: Diagnosis not present

## 2020-03-28 DIAGNOSIS — I63512 Cerebral infarction due to unspecified occlusion or stenosis of left middle cerebral artery: Secondary | ICD-10-CM | POA: Diagnosis not present

## 2020-03-28 LAB — GLUCOSE, CAPILLARY
Glucose-Capillary: 126 mg/dL — ABNORMAL HIGH (ref 70–99)
Glucose-Capillary: 142 mg/dL — ABNORMAL HIGH (ref 70–99)
Glucose-Capillary: 124 mg/dL — ABNORMAL HIGH (ref 70–99)
Glucose-Capillary: 157 mg/dL — ABNORMAL HIGH (ref 70–99)
Glucose-Capillary: 123 mg/dL — ABNORMAL HIGH (ref 70–99)

## 2020-03-28 LAB — CBC
HCT: 35.3 % — ABNORMAL LOW (ref 36.0–46.0)
Hemoglobin: 11.1 g/dL — ABNORMAL LOW (ref 12.0–15.0)
MCH: 31.9 pg (ref 26.0–34.0)
MCHC: 31.4 g/dL (ref 30.0–36.0)
MCV: 101.4 fL — ABNORMAL HIGH (ref 80.0–100.0)
Platelets: 385 10*3/uL (ref 150–400)
RBC: 3.48 MIL/uL — ABNORMAL LOW (ref 3.87–5.11)
RDW: 14 % (ref 11.5–15.5)
WBC: 13.4 10*3/uL — ABNORMAL HIGH (ref 4.0–10.5)
nRBC: 0 % (ref 0.0–0.2)

## 2020-03-28 LAB — BASIC METABOLIC PANEL
Anion gap: 13 (ref 5–15)
BUN: 23 mg/dL — ABNORMAL HIGH (ref 6–20)
CO2: 20 mmol/L — ABNORMAL LOW (ref 22–32)
Calcium: 8.9 mg/dL (ref 8.9–10.3)
Chloride: 101 mmol/L (ref 98–111)
Creatinine, Ser: 0.65 mg/dL (ref 0.44–1.00)
GFR, Estimated: >60 mL/min (ref >60)
Glucose, Bld: 77 mg/dL (ref 70–99)
Potassium: 4.9 mmol/L (ref 3.5–5.1)
Sodium: 134 mmol/L — ABNORMAL LOW (ref 135–145)

## 2020-03-28 LAB — ANTINUCLEAR ANTIBODIES, IFA: ANA Ab, IFA: NEGATIVE

## 2020-03-28 NOTE — H&P (Addendum)
Physical Medicine and Rehabilitation Admission H&P    HPI: Deborah [name] is a 49 year old right-handed female history of diabetes mellitus hyperlipidemia and tobacco abuse. History taken from chart review, niece, and therapies due to aphasia. She presented on 03/08/2020 with right side hemiparesis and aphasia to an outside hospital.  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  Urine drug screen positive benzos and barbiturates.  CTA confirming a left M2 cutoff.  She was brought to [facility] underwent thrombectomy per interventional radiology.  MRI showed large territory infarct with moderate hemorrhage and mild subarachnoid hemorrhage.  Edema with mass-effect 7 mm right side midline shift. Echocardiogram with ejection fraction of 60-65%, no wall motion abnormalities.  EEG negative for seizure.  She required intubation for extended period of time underwent tracheostomy on 03/13/2020 per Dr. Levon Hedger.  She was downsized to #4 cuffless trach 03/28/2020.  She is currently NPO with alternative means of nutritional support.  Maintain on Plavix for CVA prophylaxis.  Subcutaneous Lovenox for DVT prophylaxis.  Bouts of urinary retention, placed on low-dose Urecholine.  Therapy evaluations completed with recommendations of physical medicine rehab consult.  Patient was admitted for a comprehensive rehab program.  Please see preadmission assessment from earlier today as well.  Review of Systems  Unable to perform ROS: Medical condition   Past Medical History:  Diagnosis Date  . DM (diabetes mellitus), type 2 (HCC)   . Hyperlipidemia   . Hypersomnia   . Sprain of right ankle, initial encounter   . Syncope    Past Surgical History:  Procedure Laterality Date  . IR CT HEAD LTD  03/08/2020  . IR PERCUTANEOUS ART THROMBECTOMY/INFUSION INTRACRANIAL INC DIAG ANGIO  03/08/2020  . RADIOLOGY WITH ANESTHESIA N/A 03/08/2020   Procedure: IR WITH ANESTHESIA;  Surgeon: Julieanne Cotton, MD;  Location: [facility] OR;   Service: Radiology;  Laterality: N/A;   Family History  Problem Relation Age of Onset  . Heart defect Mother        Leaky Valve  . Heart Problems Father        Enlarged heart   . Heart defect Maternal Grandmother        Leaky Valve   . Stroke Paternal Grandfather    Social History:  reports that she has been smoking cigarettes. She has been smoking about 0.50 packs per day. She has never used smokeless tobacco. She reports previous alcohol use. She reports previous drug use. Allergies:  Allergies  Allergen Reactions  . Aspirin Shortness Of Breath  . Mushroom Extract Complex Swelling   Medications Prior to Admission  Medication Sig Dispense Refill  . acetaminophen (TYLENOL) 325 MG tablet Take 325 mg by mouth every 6 (six) hours as needed.    Marland Kitchen albuterol (VENTOLIN HFA) 108 (90 Base) MCG/ACT inhaler Inhale 2 puffs into the lungs every 6 (six) hours as needed for wheezing or shortness of breath.     . ALPRAZolam (XANAX) 0.5 MG tablet Take 0.5 mg by mouth 3 (three) times daily as needed for anxiety. 1 tablet by mouth TID PRN    . atorvastatin (LIPITOR) 20 MG tablet Take 20 mg by mouth daily.    . Butalbital-APAP-Caffeine 50-325-40 MG capsule Take 1 capsule by mouth 2 (two) times daily as needed for headache.     . cyclobenzaprine (FLEXERIL) 5 MG tablet Take 5 mg by mouth 3 (three) times daily.     . diphenhydrAMINE (BENADRYL) 25 mg capsule Take 25 mg by mouth daily.    Marland Kitchen  glimepiride (AMARYL) 2 MG tablet Take 2 mg by mouth daily with breakfast.    . insulin glargine (LANTUS SOLOSTAR) 100 UNIT/ML Solostar Pen Inject 28 Units into the skin daily.     Marland Kitchen liraglutide (VICTOZA) 18 MG/3ML SOPN Inject 1.8 mg into the skin daily. 1.8 daily      . Phenyleph-Doxylamine-DM-APAP (NYQUIL SEVERE COLD/FLU) 5-6.25-10-325 MG/15ML LIQD Take 15 mLs by mouth every 4 (four) hours as needed (cough).    . promethazine (PHENERGAN) 25 MG tablet Take 25 mg by mouth 3 (three) times daily as needed for nausea  (dizziness).     . traMADol (ULTRAM) 50 MG tablet Take 50-100 mg by mouth every 8 (eight) hours as needed for moderate pain.     Marland Kitchen clopidogrel (PLAVIX) 75 MG tablet Take 1 tablet (75 mg total) by mouth daily. 90 tablet 2  . Erenumab-aooe 70 MG/ML SOAJ Inject into the skin. Once Monthly (Patient not taking: Reported on 03/08/2020)    . glucose blood (ACCU-CHEK AVIVA PLUS) test strip USE 1 STRIP UTD    . Insulin Pen Needle (NOVOFINE) 32G X 6 MM MISC USE BID    . Lancets (ACCU-CHEK MULTICLIX) lancets U TID    . levofloxacin (LEVAQUIN) 500 MG tablet Take by mouth. (Patient not taking: Reported on 03/08/2020)      Drug Regimen Review Drug regimen was reviewed and remains appropriate with no significant issues identified  Home: Home Living Family/patient expects to be discharged to:: Other (Comment) Living Arrangements: Spouse/significant other, Children Available Help at Discharge: Family, Available 24 hours/day Type of Home: Mobile home Home Access: Stairs to enter [employer] of Steps: ~7 Entrance Stairs-Rails: Right, Left Home Layout: One level Bathroom Shower/Tub: Health visitor: Standard Bathroom Accessibility: No Additional Comments: difficult to determine, pt intubated, no family present   Functional History: Prior Function Comments: Per palliative note, Pt was ambulatory and performing her own ADL  Functional Status:  Mobility: Bed Mobility Overal bed mobility: Needs Assistance Bed Mobility: Supine to Sit, Rolling Rolling: Max assist Sidelying to sit: Max assist Supine to sit: Max assist Sit to supine: Total assist General bed mobility comments: partial roll to left side then up to EOB position. Assist to advance R side and power up trunk.  Transfers Overall transfer level: Needs assistance Equipment used: Ambulation equipment used Transfer via Lift Equipment: Stedy Transfers: Sit to/from Stand Sit to Stand: Min assist, Mod assist, +2  safety/equipment Stand pivot transfers: +2 safety/equipment General transfer comment: 3x from elevated EOB height. assist to powerup, steady and control descent. 1x to RW, 1x to posterior aspect of bedside chair, and 1x to stedy. TCs applied to R quad with R knee block on occasion to extend knee, with no activation noted from pt. Ambulation/Gait Ambulation/Gait assistance:  (unable) General Gait Details: unable    ADL: ADL Overall ADL's : Needs assistance/impaired Grooming: Maximal assistance, Total assistance, Cueing for sequencing, Bed level Grooming Details (indicate cue type and reason): hand over hand for hand to face, able to grasp wash cloth, and perseverating on washing eyes, no attention to R side of face. Toileting- Clothing Manipulation and Hygiene: Moderate assistance, +2 for physical assistance, +2 for safety/equipment, Cueing for sequencing, Cueing for safety, Bed level Toileting - Clothing Manipulation Details (indicate cue type and reason): Rolling to complete peri-care Functional mobility during ADLs: Maximal assistance, +2 for physical assistance, +2 for safety/equipment General ADL Comments: sat EOB about 15 minutes at close min guard level. Assist to position R side into  supportive, weighbearing position.   Cognition: Cognition Overall Cognitive Status: Difficult to assess Arousal/Alertness: Awake/alert Orientation Level: Oriented to person Attention: Focused Focused Attention: Appears intact Cognition Arousal/Alertness: Awake/alert Behavior During Therapy: Impulsive Overall Cognitive Status: Difficult to assess Area of Impairment: Following commands, Problem solving, Attention, Safety/judgement Orientation Level:  (pt able to respond to her name ) Current Attention Level: Sustained Following Commands: Follows one step commands consistently, Follows one step commands with increased time Safety/Judgement: Decreased awareness of safety, Decreased awareness of  deficits Problem Solving: Slow processing, Decreased initiation, Difficulty sequencing, Requires verbal cues, Requires tactile cues General Comments: Pt emotional during session, believed to be from frustration at her current functional status. Did not appear to be from pain. Pt able to look to therapist when her name was stated. Required continual cues to make pt aware of R LE and UE, with min to no success as pt continued to move without acknowledging/assisting them. Pt impulsive and requires cues to maintain safety. Difficult to assess due to: Impaired communication, Tracheostomy  Physical Exam: Blood pressure 121/75, pulse 79, temperature 98.7 F (37.1 C), temperature source Oral, resp. rate (!) 22, height 5' (1.524 m), weight 90.6 kg, SpO2 94 %. Physical Exam Vitals reviewed.  Constitutional:      Appearance: She is obese.  HENT:     Head: Normocephalic and atraumatic.     Comments: + NG    Right Ear: External ear normal.     Left Ear: External ear normal.     Nose: Nose normal.  Eyes:     General:        Right eye: No discharge.        Left eye: No discharge.     Extraocular Movements: Extraocular movements intact.  Neck:     Comments: + Trach with PMV Cardiovascular:     Rate and Rhythm: Normal rate and regular rhythm.  Pulmonary:     Effort: Pulmonary effort is normal. No respiratory distress.     Breath sounds: No stridor. Wheezing present.  Abdominal:     General: Abdomen is flat. Bowel sounds are normal. There is no distension.  Musculoskeletal:     Comments: No edema or tenderness in extremities  Skin:    General: Skin is warm and dry.  Neurological:     Mental Status: She is alert.     Comments: Alert Global aphasia Makes eye contact with examiner  Motor: Limited by ability to follow commands: LUE/LE appear to be grossly intact with spontaneous movements noted No spontaneous movements noted RUE/RLE, however withdraws to painful stimuli. Limited by apraxia as  well  Psychiatric:     Comments: Unable to assess due to aphasia     Results for orders placed or performed during the hospital encounter of 03/08/20 (from the past 48 hour(s))  Glucose, capillary     Status: Abnormal   Collection Time: 03/28/20  4:01 PM  Result Value Ref Range   Glucose-Capillary 157 (H) 70 - 99 mg/dL    Comment: Glucose reference range applies only to samples taken after fasting for at least 8 hours.   Comment 1 Notify RN    Comment 2 Document in Chart   Glucose, capillary     Status: Abnormal   Collection Time: 03/28/20  8:26 PM  Result Value Ref Range   Glucose-Capillary 123 (H) 70 - 99 mg/dL    Comment: Glucose reference range applies only to samples taken after fasting for at least 8 hours.  Glucose, capillary  Status: Abnormal   Collection Time: 03/29/20 12:15 AM  Result Value Ref Range   Glucose-Capillary 108 (H) 70 - 99 mg/dL    Comment: Glucose reference range applies only to samples taken after fasting for at least 8 hours.  Glucose, capillary     Status: Abnormal   Collection Time: 03/29/20  3:41 AM  Result Value Ref Range   Glucose-Capillary 113 (H) 70 - 99 mg/dL    Comment: Glucose reference range applies only to samples taken after fasting for at least 8 hours.  Glucose, capillary     Status: Abnormal   Collection Time: 03/29/20  7:39 AM  Result Value Ref Range   Glucose-Capillary 130 (H) 70 - 99 mg/dL    Comment: Glucose reference range applies only to samples taken after fasting for at least 8 hours.  Glucose, capillary     Status: None   Collection Time: 03/29/20 11:33 AM  Result Value Ref Range   Glucose-Capillary 95 70 - 99 mg/dL    Comment: Glucose reference range applies only to samples taken after fasting for at least 8 hours.  Glucose, capillary     Status: Abnormal   Collection Time: 03/29/20  4:09 PM  Result Value Ref Range   Glucose-Capillary 118 (H) 70 - 99 mg/dL    Comment: Glucose reference range applies only to samples  taken after fasting for at least 8 hours.  Glucose, capillary     Status: Abnormal   Collection Time: 03/29/20  8:23 PM  Result Value Ref Range   Glucose-Capillary 104 (H) 70 - 99 mg/dL    Comment: Glucose reference range applies only to samples taken after fasting for at least 8 hours.  Glucose, capillary     Status: Abnormal   Collection Time: 03/29/20 11:47 PM  Result Value Ref Range   Glucose-Capillary 113 (H) 70 - 99 mg/dL    Comment: Glucose reference range applies only to samples taken after fasting for at least 8 hours.  CBC     Status: Abnormal   Collection Time: 03/30/20 12:59 AM  Result Value Ref Range   WBC 15.6 (H) 4.0 - 10.5 K/uL   RBC 3.43 (L) 3.87 - 5.11 MIL/uL   Hemoglobin 10.9 (L) 12.0 - 15.0 g/dL   HCT 21.1 (L) 36 - 46 %   MCV 99.1 80.0 - 100.0 fL   MCH 31.8 26.0 - 34.0 pg   MCHC 32.1 30.0 - 36.0 g/dL   RDW 94.1 74.0 - 81.4 %   Platelets 450 (H) 150 - 400 K/uL   nRBC 0.0 0.0 - 0.2 %    Comment: Performed at [facility], [address], [place], [place] [address]  Basic metabolic panel     Status: Abnormal   Collection Time: 03/30/20 12:59 AM  Result Value Ref Range   Sodium 137 135 - 145 mmol/L   Potassium 4.0 3.5 - 5.1 mmol/L   Chloride 104 98 - 111 mmol/L   CO2 24 22 - 32 mmol/L   Glucose, Bld 116 (H) 70 - 99 mg/dL    Comment: Glucose reference range applies only to samples taken after fasting for at least 8 hours.   BUN 21 (H) 6 - 20 mg/dL   Creatinine, Ser 6.31 0.44 - 1.00 mg/dL   Calcium 9.1 8.9 - 49.7 mg/dL   GFR, Estimated >02 >63 mL/min   Anion gap 9 5 - 15    Comment: Performed at [facility], [address]  [place], [place] [address]  Glucose, capillary     Status: Abnormal   Collection Time: 03/30/20  4:01 AM  Result Value Ref Range   Glucose-Capillary 113 (H) 70 - 99 mg/dL    Comment: Glucose reference range applies only to samples taken after fasting for at least 8 hours.  Glucose, capillary     Status: Abnormal   Collection  Time: 03/30/20  7:58 AM  Result Value Ref Range   Glucose-Capillary 124 (H) 70 - 99 mg/dL    Comment: Glucose reference range applies only to samples taken after fasting for at least 8 hours.  Glucose, capillary     Status: Abnormal   Collection Time: 03/30/20  8:28 AM  Result Value Ref Range   Glucose-Capillary 123 (H) 70 - 99 mg/dL    Comment: Glucose reference range applies only to samples taken after fasting for at least 8 hours.   Comment 1 Notify RN    Comment 2 Document in Chart   Glucose, capillary     Status: Abnormal   Collection Time: 03/30/20 11:34 AM  Result Value Ref Range   Glucose-Capillary 152 (H) 70 - 99 mg/dL    Comment: Glucose reference range applies only to samples taken after fasting for at least 8 hours.   Comment 1 Notify RN    Comment 2 Document in Chart    DG Chest Port 1 View  Result Date: 03/30/2020 CLINICAL DATA:  Leukocytosis EXAM: PORTABLE CHEST 1 VIEW COMPARISON:  03/19/2020 FINDINGS: Tracheostomy tube remains in place. Enteric tube courses below the diaphragm with distal tip beyond the inferior margin of the film. Stable heart size. Slightly low lung volumes. Minimal streaky bibasilar opacity. No pleural effusion or pneumothorax. IMPRESSION: Minimal streaky bibasilar opacity, atelectasis versus infiltrate. Electronically Signed   By: Duanne Guess D.O.   On: 03/30/2020 11:03    Medical Problem List and Plan: 1.  Right side hemiparesis with dysphagia secondary to left MCA infarct status post IR with postprocedural hemorrhage, infarct secondary to large vessel disease  -patient may not shower  -ELOS/Goals: Min/mod A/22-25 days  Admit to CIR 2.  Antithrombotics: -DVT/anticoagulation: Lovenox  -antiplatelet therapy: Plavix 75 mg daily 3. Pain Management: Tylenol as needed 4. Mood: Provide emotional support  -antipsychotic agents: N/A 5. Neuropsych: This patient is not capable of making decisions on her own behalf. 6. Skin/Wound Care: Routine skin  checks 7. Fluids/Electrolytes/Nutrition: Routine in and outs. 8.  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  Currently with a #4 cuffless trach as of 03/28/2020. Continue PMV as tolerated. Follow-up speech therapy 9.  Post-stroke Dysphagia.  NPO. Alternative means of nutritional support.  Follow-up speech therapy 10. Uncontrolled diabetes mellitus with hyperglycemia.  Hemoglobin A1c 10.7.  NovoLog 10 units every 4 hours, Lantus insulin 25 units twice daily. 11.  Urinary retention.  Urecholine 10 mg 3 times daily.    Check PVRs 12.  Hyperlipidemia. Lipitor 13.  Tobacco abuse. Counseled on appropriate 14. Leukocytosis  WBCs 15.6 on 10/13  Afebrile  Chest x-ray showing? Infiltrates, however more likely atelectasis-continue to monitor clinically  Charlton Amor, PA-C 03/30/2020  I have personally performed a face to face diagnostic evaluation, including, but not limited to relevant history and physical exam findings, of this patient and developed relevant assessment and plan.  Additionally, I have reviewed and concur with the physician assistant's documentation above.  Maryla Morrow, MD, ABPMR

## 2020-03-28 NOTE — Progress Notes (Signed)
Physical Therapy Treatment Patient Details Name: Deborah [name] MRN: [id number] DOB: February 23, 1971 Today's Date: 03/28/2020    History of Present Illness 49 y/o female with history of IDDM, HLD, tobacco use disorder presented to [facility] for facial droop, right sided weakness and global aphasia. Found to have LVO of left M1 segment and was transferred to [facility]. Pt underwent left common carotid arteriogram followed by revascularization of L MCA, however it reoccluded due to sever distal M1 stenosis.Pt underwent trach on 9/26.    PT Comments    Pt making progress towards her goals and has even met her sitting balance goal as she is able to maintain midline sitting static balance with L UE support and min guard. Goals have been updated to reflect her progress. Pt continues to neglect the R side despite cues to acknowledge and bring R UE and LE with her body. ModA to manage her R side for bed mobility. STS with use of stedy and tech for safety x3 reps with minA. Pt comes to stand primarily in midline this date. Pt was emotional and cried several times during session, causing her SpO2 to drop to low 80s% while trach collar was removed. Trach collar was thus reapplied, but otherwise her SpO2 remained at 100% during the session without the trach collar. Will continue to address her deficits with acute PT services to maximize her independence and safety with functional mobility.    Follow Up Recommendations  CIR     Equipment Recommendations  Wheelchair (measurements PT);Wheelchair cushion (measurements PT);Hospital bed;3in1 (PT)    Recommendations for Other Services Rehab consult     Precautions / Restrictions Precautions Precautions: Fall Precaution Comments: NG, R Hemi, +2, IV, trach collar Restrictions Weight Bearing Restrictions: No    Mobility  Bed Mobility Overal bed mobility: Needs Assistance Bed Mobility: Supine to Sit     Supine to sit: Mod assist;HOB elevated      General bed mobility comments: Attempted to cue pt to move R LE and UE as she tends to attempt to sit up and leave R side behind, min to no success. Required modA to manage R side of body.  Transfers Overall transfer level: Needs assistance Equipment used: Ambulation equipment used (sara stedy) Transfers: Sit to/from Stand Sit to Stand: Min assist;+2 safety/equipment         General transfer comment: STS 3x during session with TCs at R quad and hand-over-hand assistance to maintain R hand on stedy bar. Pt able to pull up to stand quickly without overt lateral lean.   Ambulation/Gait                 Stairs             Wheelchair Mobility    Modified Rankin (Stroke Patients Only) Modified Rankin (Stroke Patients Only) Pre-Morbid Rankin Score: No symptoms Modified Rankin: Severe disability     Balance Overall balance assessment: Needs assistance Sitting-balance support: Single extremity supported;Feet supported Sitting balance-Leahy Scale: Poor Sitting balance - Comments: Min guard to sit statically, maintaining midline, for ~3-4 min with L UE support on bed.    Standing balance support: Bilateral upper extremity supported Standing balance-Leahy Scale: Poor Standing balance comment: Pt stands statically in stedy with B UE support and minA - min guard to maintain midline balance. TCs at R quad with VCs to extend knee.  Cognition Arousal/Alertness: Awake/alert Behavior During Therapy:  (pt crying several times during session from frustration) Overall Cognitive Status: Impaired/Different from baseline Area of Impairment: Following commands;Problem solving;Attention;Safety/judgement                   Current Attention Level: Sustained   Following Commands: Follows one step commands consistently;Follows one step commands with increased time;Follows multi-step commands inconsistently Safety/Judgement: Decreased  awareness of safety;Decreased awareness of deficits   Problem Solving: Slow processing;Decreased initiation;Difficulty sequencing;Requires verbal cues;Requires tactile cues General Comments: Pt emotional during session, believed to be from frustration at her current functional status. Did not appear to be from pain. Pt able to look to therapist when her name was stated. Required continual cues to make pt aware of R LE and UE, with min to no success as pt continued to move without acknowledging/assisting them. Pt impulsive and requires cues to maintain safety.      Exercises      General Comments General comments (skin integrity, edema, etc.): Trach collar removed during session with SpO2 remaining 100% until pt began to cry in which it decreased to low 80s, thus reapplied trach collar with it imemdiately improving to 100% again. Trach collar donned at end of session with setting of 5L 28% FiO2.      Pertinent Vitals/Pain Pain Assessment: Faces Faces Pain Scale: Hurts a little bit Pain Location: generalized response to noxious stimuli Pain Descriptors / Indicators: Grimacing Pain Intervention(s): Monitored during session    Home Living                      Prior Function            PT Goals (current goals can now be found in the care plan section) Acute Rehab PT Goals Patient Stated Goal: pt unable to state, PT goal to improve strength and functional mobility quality while reducing caregiver burden PT Goal Formulation: Patient unable to participate in goal setting Time For Goal Achievement: 04/09/20 Potential to Achieve Goals: Fair Progress towards PT goals: Progressing toward goals;Goals met and updated - see care plan    Frequency    Min 4X/week      PT Plan Current plan remains appropriate    Co-evaluation     PT goals addressed during session: Mobility/safety with mobility;Balance        AM-PAC PT "6 Clicks" Mobility   Outcome Measure  Help needed  turning from your back to your side while in a flat bed without using bedrails?: A Little Help needed moving from lying on your back to sitting on the side of a flat bed without using bedrails?: A Little Help needed moving to and from a bed to a chair (including a wheelchair)?: A Lot Help needed standing up from a chair using your arms (e.g., wheelchair or bedside chair)?: A Little Help needed to walk in hospital room?: Total Help needed climbing 3-5 steps with a railing? : Total 6 Click Score: 13    End of Session Equipment Utilized During Treatment: Gait belt;Oxygen Activity Tolerance: Other (comment) (crying) Patient left: in chair;with call bell/phone within reach;with chair alarm set   PT Visit Diagnosis: Unsteadiness on feet (R26.81);Other abnormalities of gait and mobility (R26.89);Muscle weakness (generalized) (M62.81);Difficulty in walking, not elsewhere classified (R26.2);Other symptoms and signs involving the nervous system (R29.898);Hemiplegia and hemiparesis Hemiplegia - Right/Left: Right Hemiplegia - caused by: Cerebral infarction     Time: 0177-9390 PT Time Calculation (min) (ACUTE ONLY): 30 min  Charges:  $Therapeutic Activity: 8-22 mins $Neuromuscular Re-education: 8-22 mins                     Moishe Spice, PT, DPT Acute Rehabilitation Services  Pager: [phone] Office: [place] 03/28/2020, 2:02 PM

## 2020-03-28 NOTE — Progress Notes (Signed)
  Speech Language Pathology Treatment: Dysphagia;Cognitive-Linquistic;Passy Muir Speaking valve  Patient Details Name: Deborah [name] MRN: [id number] DOB: January 07, 1971 Today's Date: 03/28/2020 Time: 1025-8527 SLP Time Calculation (min) (ACUTE ONLY): 22 min  Assessment / Plan / Recommendation Clinical Impression  Pt was seen for skilled ST targeting cognitive-linguistic, PMV, and swallowing goals. A small amount of secretions were suctioned from trach prior to placing PMV. Pt was unable to redirect air through the upper airway with PMV placed as evidenced by a burst of air and release of pressure when valve was removed. Pt also demonstrated increased respiratory rate and discomfort during trial. Continue to recommend downsizing of trach and/or changing to cuffless trach to improve toleration of PMV. SLP provided pt with cup sip of thin liquid, and she demonstrated good awareness and labial seal. She demonstrated delayed oral transit and suspected delayed swallow initiation with multiple swallows observed. Pt followed simple one-step commands with about 50% accuracy when given faded multimodal cueing and modeling. She appears to be utilizing various methods to communicate yes/no, including thumbs up and blinking, which appeared to be negatively impacted by her apraxia. SLP attempted to practice nodding yes/no with her, but she became emotional and the session was discontinued. SLP will continue to f/u acutely to address all goal areas.    HPI HPI: 49 y/o female with history of IDDM, HLD, tobacco use disorder presented to [facility] for facial droop, right sided weakness and global aphasia. Found to have LVO of left M1 segment and was transferred to [facility]. Pt underwent left common carotid arteriogram followed by revascularization of L MCA, however it reoccluded due to sever distal M1 stenosis.Pt underwent trach on 9/26.      SLP Plan  Continue with current plan of care       Recommendations   Diet recommendations: NPO Medication Administration: Via alternative means      Patient may use Passy-Muir Speech Valve: with SLP only MD: Please consider changing trach tube to : Smaller size;Cuffless         Oral Care Recommendations: Oral care QID Follow up Recommendations: Inpatient Rehab SLP Visit Diagnosis: Dysphagia, oropharyngeal phase (R13.12);Aphonia (R49.1);Apraxia (R48.2);Aphasia (R47.01) Plan: Continue with current plan of care       GO                Royetta Crochet 03/28/2020, 1:50 PM

## 2020-03-28 NOTE — Progress Notes (Signed)
Inpatient Rehab Admissions Coordinator:   Awaiting determination from insurance regarding prior auth for CIR.  Will continue to follow.   Estill Dooms, PT, DPT Admissions Coordinator [phone] 03/28/20  11:53 AM

## 2020-03-28 NOTE — Progress Notes (Signed)
Inpatient Rehabilitation Admissions Coordinator  We await insurance approval for a possible Cir admit.  Ottie Glazier, RN, MSN Rehab Admissions Coordinator [phone] 03/28/2020 3:20 PM

## 2020-03-28 NOTE — Procedures (Signed)
Tracheostomy Change Note  Patient Details:   Name: Gabrielle Wakeland DOB: 03/24/1971 MRN: [id number]    Airway Documentation:     Evaluation  O2 sats: stable throughout Complications: No apparent complications Patient did tolerate procedure well. Bilateral Breath Sounds: Clear, Diminished. SPO2 100% on RA ATC    Toula Moos 03/28/2020, 4:41 PM

## 2020-03-28 NOTE — Progress Notes (Signed)
NAME:  Deborah [name], MRN:  [id number], DOB:  1971/02/01, LOS: 20 ADMISSION DATE:  03/08/2020, CONSULTATION DATE:  03/08/20 REFERRING MD:  Neurology, CHIEF COMPLAINT:  CVA    Brief History   49 y/o female with history of IDDM, HLD, tobacco use disorder presented to [facility] for facial droop, right sided weakness and global aphasia. Found to have LVO of left M1 segment and was transferred to [facility] for attempt at endovascular revascularization.   History of present illness   49 y/o female presented after being found by her husband this morning with facial droop, right sided weakness, and global aphasia. LKN was 8:00 pm the night before. NIH score of 21 on arrival. She was immediately taken to IR and intubated for attempt at endovascular revascularization. Unfortunately, she had immediate reocclusion after achieving TICI 2c revascularization. Rescue stent not performed due to increased risk of ICH. She was transferred to the ICU for continued monitoring post-procedure.   Past Medical History  IDDM HLD Tobacco use Migraine headaches Chronic pain  Anxiety   Significant Hospital Events   9/21 > intubated for IR procedure 9/21> unsuccessful revascularization 9/21> admitted to neuro ICU 9/24: pt remains on vent on light sedation awake but able to utilize extremities to follow commands. Does hold eyes shut on command. Unable to perform tongue thrust. Pt and family would benefit from palliative consult, d/w Dr Roda Shutters (he will speak with pt's boyfriend who son is deferring decisions to). 3% stopped overnight 2/2 Na >155. Neurosx signing off despite some progressive shift on imaging. 9/25: Planning for tracheostomy today-this is rescheduled 9/26: No overnight events  Consults:  PCCM Neurosurgery   Procedures:  9/21>cerebral arteriogram with emergent mechanical thrombectomy  9/26 trach>  Significant Diagnostic Tests:  9/21 CT head > Acute left MCA territory infarct without hemorrhagic  conversion or progression from CT earlier today. Aspects remains 6  9/22 at 1 am CT head >Continued interval evolution of large left MCA territory infarct, overall similar in size and distribution from previous. Associated regional mass effect with partial effacement of the left lateral ventricle has mildly worsened, with worsened left-to-right midline shift now measuring up to 5 mm 9/22 at 7:50 am CT head > Unchanged extent of a large acute/early subacute left MCA vascular territory infarct and associated hemorrhage as compared to the head CT performed earlier the same day at 12:57 a.m. Continued slight interval increase in mass effect with partial effacement of the left lateral ventricle and now 6 mm rightward midline shift (previously 5 mm). The basal cisterns remain patent. No evidence of ventricular entrapment. 9/22 MRI brain > Large territory infarct left MCA territory with moderate hemorrhage and mild subarachnoid hemorrhage. There is edema and mass-effect with 7 mm midline shift to the right. Left MCA is occluded in the M1 segment distal to an early temporal branch. No other intracranial stenosis 9/24 cth: Acute left MCA territory infarct with mildly progressive swelling (9 mm midline shift) and regressed hemorrhage.   Micro Data:  N/A  Antimicrobials:  N/A   Interim history/subjective:  Patient on room air working with physical therapy this morning. Notes mention blood tinged secretions over recent days. Discussed case with RT and there are minimal secretions noted this morning. SLP has been evaluating patient but difficulty working with PMV due to cuffed trach and size of trach.  Objective   Blood pressure (!) 126/53, pulse 73, temperature 98.6 F (37 C), temperature source Oral, resp. rate 20, height 5' (1.524 m), weight 91.7 kg,  SpO2 98 %.    FiO2 (%):  [21 %-28 %] 21 %   Intake/Output Summary (Last 24 hours) at 03/28/2020 1230 Last data filed at 03/28/2020 1017 Gross  per 24 hour  Intake 9182 ml  Output 2150 ml  Net 7032 ml   Filed Weights   03/23/20 0336 03/27/20 0348 03/28/20 0420  Weight: 94.8 kg 94.5 kg 91.7 kg    Examination:  General: sitting on edge of bed, no acute distress HEENT: #6 cuffed trach in place  Neuro: awake, alert, moving all extremities CV: RRR, S1S2 PULM: good air movement, course breath sounds GI: soft, bsx4 active, feeding tube in place  Extremities: warm/dry,  edema  Skin: no rashes or lesions    Assessment & Plan:   Left MCA stroke, unsuccessful revascularization -Neuro status remains unchanged-- CT H with stable L MCA infarct + stable 36mm L to R midline shift  Cerebral edema  P Per neuro  Acute respiratory failure secondary to inadequate airway protection -s/p tracheostomy 9/26 P Continue trach collar Will down size to a #4 cuffless trach today, discussed plan with RT and orders placed.   PCCM will see weekly for trach needs.  Please call if needed sooner.  Melody Comas, MD [facility] Office: [phone]   See Amion for Pager Details

## 2020-03-28 NOTE — Progress Notes (Signed)
STROKE TEAM PROGRESS NOTE   INTERVAL HISTORY Patient continues to tolerate trach collar well.  Secretions are minimum.  Vital signs are stable.  She remains aphasic with right hemiparesis.  She follows only midline and one-step commands.  Rehab bed is pending  Vitals:   03/28/20 0355 03/28/20 0420 03/28/20 0802 03/28/20 0819  BP: 139/66   (!) 127/50  Pulse: 78  81 77  Resp: 18  17 20   Temp: 98.7 F (37.1 C)   98.9 F (37.2 C)  TempSrc: Oral   Oral  SpO2: 96%  99% 95%  Weight:  91.7 kg    Height:       CBC:  Recent Labs  Lab 03/27/20 0622 03/28/20 0041  WBC 14.4* 13.4*  HGB 10.3* 11.1*  HCT 33.6* 35.3*  MCV 104.0* 101.4*  PLT 387 385   Basic Metabolic Panel:  Recent Labs  Lab 03/27/20 0815 03/28/20 0041  NA 132* 134*  K 4.8 4.9  CL 98 101  CO2 24 20*  GLUCOSE 179* 77  BUN 21* 23*  CREATININE 0.70 0.65  CALCIUM 8.8* 8.9   IMAGING past 24 hours No results found.   PHYSICAL EXAM      Temp:  [98.2 F (36.8 C)-98.9 F (37.2 C)] 98.9 F (37.2 C) (10/11 0819) Pulse Rate:  [74-85] 77 (10/11 0819) Resp:  [13-20] 20 (10/11 0819) BP: (105-139)/(49-70) 127/50 (10/11 0819) SpO2:  [95 %-100 %] 95 % (10/11 0819) FiO2 (%):  [21 %-28 %] 21 % (10/11 0802) Weight:  [91.7 kg] 91.7 kg (10/11 0420)  General - Well nourished, well developed, in no apparent distress, on trach collar.  Ophthalmologic - fundi not visualized due to noncooperation.  Cardiovascular - Regular rhythm and rate.  Neuro - awake alert, eyes open, on trach collar, nonverbal, able to track bilaterally, no gaze deviation but left gaze preference, not blinking to visual threat on the right.  Able to follow simple midline one-step commands not able to follow peripheral commands.  Right facial droop, tongue protrusion not corporative.  Left upper extremity at least 4/5.  Left lower extremity at least 3/5.  Right upper extremity flaccid.  Right lower extremity slight withdraw to pain.  Right Babinski positive.  Sensation, coordination not corporative and gait not tested.   ASSESSMENT/PLAN Deborah [name] is a 49 y.o. female with history of difficulty to control DB, HLD, syncope presenting to [facility] with R hemiparesis, facial droop, left gaze, and global aphasia w/ glucose 400s where she was found to have L M1 occlusion and transferred to [facility] for IR.   Stroke:   L MCA infarct s/p IR w/ post procedure hemorrhage, infarct secondary to large vessel disease source  CT head [facility]) L MCA infarct w/ ASPECTS 6  CTA head & neck [facility]) L M2 cutoff  CT head L MCA territory infarct. ASPECTS 6.   CT perfusion 63 core L brain same as CT. 32 penumbra. 1.5 mismatch ratio.  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 Post IR hemorrhage.  CT head 9/21 1818 increased L MCA ischemic infarct w/ mass effect and partial effacement L lateral ventrilc w/ 42mm midline shift. Small foci L BG HT same w/ increased L perisylvian and L cerebral convexity hemorrhage.   CT head 9/22 0151 evolution large L MCA infarct w/ slightly worse effacement and mass effect w/ 49mm midline shift. Scattered foci hemorrhage in infarct same  w/ contrast clearing. L M1 and proximal L MCA branches w/ persistent hyperdensity.   CT head 9/22 0827  L MCA infarct and hemorrhages same. Increase in mass effect and effacement now 41mm midline shift. L M1 and proximal L MCA braches w/ persistent hyperdensity.   MRI 9/22 Large L MCA infarct w/ moderate infarct and SAH. Edema, 57mm midline shift.   MRA 9/22 L MCA 1 occlusion   CT head 9/24 L MCA infarct w/ mild increase in edema, 18mm midline shift    CT repeat 9/26 - Stable extent of left MCA territory infarct with small volume blood products. Midline shift continues to measure 9 mm.  2D Echo EF 60-65%. No source of embolus   Recent Zio patch Normal w/ infrequent  PACs, PVCs 01/07/2020  May consider further cardioembolic work up if further neuro improvement  LDL 174   HgbA1c 10.7   B12/TSH/RPR normal. ANA pending   Hypercoagulable work up negative - Antithrombin III mildly elevated - 122, homocysteine mildly elevated - 22.1   VTE prophylaxis - Lovenox 40 mg sq daily     clopidogrel 75 mg daily prior to admission, now on plavix for stroke prevention (allergic to ASA).    Therapy recommendations:  CIR  Disposition:  Pending - awaiting insurance prior auth  Acute Respiratory Failure  Secondary to stroke  Intubated for IR, left intubated post IR    Off sedation  CCM on board   Trach placed 03/13/20 - on trach collar  Has size 6 cuffed trach. Will need to go to cuffless prior to d/c. CCM following to downsize, plans for Monday 10/11. Janina Mayo must be at least 71d old if for SNF admission)  CCM on board for trach management     Cerebral Edema  MRI 9/22 with MLS @ 42mm  CT head 9/24 L MCA infarct w/ mild increase in edema, 23mm midline shift    CT repeat 9/26 - Stable extent of left MCA territory infarct with small volume blood products. Midline shift continues to measure 9 mm.  On 3% protocol - @75 ->50->NS-> off  23.4% x 1 (9/25)  Leukocytosis  WBC 13.4  (afebrile)  UA - WBC 11-20  CXR - bibasilar atx  Urine culture insignificant growth  Increased trach secretions - PRN suctioning  Finished Rocephin 5 day course  Hyperlipidemia  Home meds:  lipitor 20  LDL 174, goal < 70  On lipitor 80  Continue statin at discharge  Diabetes type II Uncontrolled  Home meds:  Glimepiride , lantus 21, liraglutide 1.8  HgbA1c 10.7, goal < 7.0  CBGs  Hyperglycemia much improved  SSI 0-15  On lantus 25 bid  novolog premeal 3U Q4 -> 5U Q4 -> 8U Q4->16U Q4->10U Q4  Changed TF formula from Jevity to Glucerna  Dysphagia . Secondary to stroke . NPO except meds . on TF @ 45 via cortrak  . On IVF @ 50 (mildly low BP) . On  FW flush 20cc Q4 . MBS 10/6 - continue NPO and meds via alternative means.  09-29-1970 Speech on board  Tobacco abuse  Current smoker  Will provide smoking cessation when able    Other Stroke Risk Factors  UDS positive benzos and barb (done following IR procedure)   Obesity, Body mass index is 39.48 kg/m., recommend weight loss, diet and exercise as appropriate   Family hx stroke (paternal grandfather)  Migraines  Other Active Problems  Chronic pain, Anxiety  Hypokalemia, resolved - 4.9  Hyponatremia, Na 134  Anemia - Hgb - 10.1->11.1  SBP mildly low - 100 - 117 -> increase IV fluid to 50 cc / hr (EF 60 - 65%)  Hospital day # 20 Continue ongoing therapies and tracheostomy care.  Patient medically stable to be transferred to inpatient rehab when bed becomes available.  BMP and CBC are stable.  Deborah Heady, MD  To contact Stroke Continuity provider, please refer to [email or web address]. After hours, contact General Neurology

## 2020-03-29 DIAGNOSIS — I63512 Cerebral infarction due to unspecified occlusion or stenosis of left middle cerebral artery: Secondary | ICD-10-CM | POA: Diagnosis not present

## 2020-03-29 LAB — GLUCOSE, CAPILLARY
Glucose-Capillary: 104 mg/dL — ABNORMAL HIGH (ref 70–99)
Glucose-Capillary: 108 mg/dL — ABNORMAL HIGH (ref 70–99)
Glucose-Capillary: 113 mg/dL — ABNORMAL HIGH (ref 70–99)
Glucose-Capillary: 113 mg/dL — ABNORMAL HIGH (ref 70–99)
Glucose-Capillary: 118 mg/dL — ABNORMAL HIGH (ref 70–99)
Glucose-Capillary: 130 mg/dL — ABNORMAL HIGH (ref 70–99)
Glucose-Capillary: 95 mg/dL (ref 70–99)

## 2020-03-29 NOTE — Progress Notes (Signed)
Physical Therapy Treatment Patient Details Name: Deborah [name] MRN: [id number] DOB: Jun 22, 1970 Today's Date: 03/29/2020    History of Present Illness 49 y/o female with history of IDDM, HLD, tobacco use disorder presented to [facility] for facial droop, right sided weakness and global aphasia. Found to have LVO of left M1 segment and was transferred to [facility]. Pt underwent left common carotid arteriogram followed by revascularization of L MCA, however it reoccluded due to sever distal M1 stenosis.Pt underwent trach on 9/26.    PT Comments    Pt making progress with her sitting balance this date as she was able to sit with min guard, maintaining midline orientation with R UE placed and maintained with hand on bed and elbow extended. Attempted to perform transfers without stedy initially, but pt required min-modAx2 to come to stand with RW or posterior aspect of bedside chair but then required maxAx2 to maintain standing balance. This was due to her leaning strongly to her R despite cues to correct and R knee block and TCs at R quad. Continue to recommend CIR as the pt would benefit from intensive therapy. Will continue to follow acutely to improve her tolerance to activity and maximize her independence and safety with functional mobility.    Follow Up Recommendations  CIR;Supervision/Assistance - 24 hour     Equipment Recommendations  Wheelchair (measurements PT);Wheelchair cushion (measurements PT);Hospital bed;3in1 (PT);Other (comment) (lift equipment varies based on pt progress)    Recommendations for Other Services Rehab consult     Precautions / Restrictions Precautions Precautions: Fall Precaution Comments: NG, R Hemi, +2, IV, trach collar Restrictions Weight Bearing Restrictions: No    Mobility  Bed Mobility Overal bed mobility: Needs Assistance Bed Mobility: Supine to Sit;Rolling Rolling: Max assist   Supine to sit: Max assist     General bed mobility comments:  partial roll to left side then up to EOB position. Assist to advance R side and power up trunk.   Transfers Overall transfer level: Needs assistance Equipment used: Ambulation equipment used Transfers: Sit to/from Stand Sit to Stand: Min assist;Mod assist;+2 safety/equipment         General transfer comment: 3x from elevated EOB height. assist to powerup, steady and control descent. 1x to RW, 1x to posterior aspect of bedside chair, and 1x to stedy. TCs applied to R quad with R knee block on occasion to extend knee, with no activation noted from pt.  Ambulation/Gait                 Stairs             Wheelchair Mobility    Modified Rankin (Stroke Patients Only) Modified Rankin (Stroke Patients Only) Pre-Morbid Rankin Score: No symptoms Modified Rankin: Severe disability     Balance Overall balance assessment: Needs assistance Sitting-balance support: Feet supported;Single extremity supported;Bilateral upper extremity supported Sitting balance-Leahy Scale: Poor Sitting balance - Comments: close min guard to sit with BUE and BLE supported Postural control: Right lateral lean Standing balance support: Bilateral upper extremity supported Standing balance-Leahy Scale: Zero Standing balance comment: better control standing with use of stedy bars vs rw and assist. tends to lean excessively to R, requiring maxAx2 to maintain static standing balance with TCs at R quad to extend knee with R knee block. Pt stood 3x for ~10-30 sec each bout.                             Cognition  Arousal/Alertness: Awake/alert Behavior During Therapy: Impulsive Overall Cognitive Status: Difficult to assess Area of Impairment: Following commands;Problem solving;Attention;Safety/judgement                   Current Attention Level: Sustained   Following Commands: Follows one step commands consistently;Follows one step commands with increased time Safety/Judgement:  Decreased awareness of safety;Decreased awareness of deficits   Problem Solving: Slow processing;Decreased initiation;Difficulty sequencing;Requires verbal cues;Requires tactile cues General Comments: Pt emotional during session, believed to be from frustration at her current functional status. Did not appear to be from pain. Pt able to look to therapist when her name was stated. Required continual cues to make pt aware of R LE and UE, with min to no success as pt continued to move without acknowledging/assisting them. Pt impulsive and requires cues to maintain safety.      Exercises      General Comments General comments (skin integrity, edema, etc.): Trach collar removed during session, with SpO2 remaining >/= 97%. Trach collar reapplied at end of session, 5L 28% FiO2.      Pertinent Vitals/Pain Pain Assessment: Faces Faces Pain Scale: No hurt Pain Location: generalized response to noxious stimuli Pain Intervention(s): Monitored during session    Home Living                      Prior Function            PT Goals (current goals can now be found in the care plan section) Acute Rehab PT Goals Patient Stated Goal: unable to state PT Goal Formulation: Patient unable to participate in goal setting Time For Goal Achievement: 04/09/20 Potential to Achieve Goals: Fair Progress towards PT goals: Progressing toward goals    Frequency    Min 4X/week      PT Plan Current plan remains appropriate    Co-evaluation PT/OT/SLP Co-Evaluation/Treatment: Yes Reason for Co-Treatment: Complexity of the patient's impairments (multi-system involvement);For patient/therapist safety;To address functional/ADL transfers PT goals addressed during session: Mobility/safety with mobility;Proper use of DME;Balance OT goals addressed during session: ADL's and self-care;Strengthening/ROM      AM-PAC PT "6 Clicks" Mobility   Outcome Measure  Help needed turning from your back to your  side while in a flat bed without using bedrails?: A Lot Help needed moving from lying on your back to sitting on the side of a flat bed without using bedrails?: A Lot Help needed moving to and from a bed to a chair (including a wheelchair)?: A Lot Help needed standing up from a chair using your arms (e.g., wheelchair or bedside chair)?: A Little Help needed to walk in hospital room?: Total Help needed climbing 3-5 steps with a railing? : Total 6 Click Score: 11    End of Session Equipment Utilized During Treatment: Gait belt;Oxygen Activity Tolerance: Other (comment);Patient tolerated treatment well (crying occasionally) Patient left: in chair;with call bell/phone within reach;with chair alarm set Nurse Communication: Mobility status PT Visit Diagnosis: Unsteadiness on feet (R26.81);Other abnormalities of gait and mobility (R26.89);Muscle weakness (generalized) (M62.81);Difficulty in walking, not elsewhere classified (R26.2);Other symptoms and signs involving the nervous system (R29.898);Hemiplegia and hemiparesis Hemiplegia - Right/Left: Right Hemiplegia - caused by: Cerebral infarction     Time: 9449-6759 PT Time Calculation (min) (ACUTE ONLY): 45 min  Charges:  $Therapeutic Activity: 8-22 mins $Neuromuscular Re-education: 8-22 mins                     Raymond Gurney, PT, DPT Acute Rehabilitation  Services  Pager: [phone] Office: [phone]    Jewel Baize 03/29/2020, 2:58 PM

## 2020-03-29 NOTE — Progress Notes (Signed)
STROKE TEAM PROGRESS NOTE   INTERVAL HISTORY Patient continues to tolerate trach collar well.  Secretions are minimum.  Vital signs are stable.  She remains aphasic with right hemiparesis.  Neuro exam is unchanged.  Insurance approval for rehab transfer is yet pending Vitals:   03/29/20 0810 03/29/20 0821 03/29/20 1115 03/29/20 1157  BP:  (!) 106/57  140/68  Pulse: 76 77 76 82  Resp: 18 20 (!) 21 18  Temp:  98.2 F (36.8 C)  98.4 F (36.9 C)  TempSrc:  Oral  Oral  SpO2: 97% 100% 98% 98%  Weight:      Height:       CBC:  Recent Labs  Lab 03/27/20 0622 03/28/20 0041  WBC 14.4* 13.4*  HGB 10.3* 11.1*  HCT 33.6* 35.3*  MCV 104.0* 101.4*  PLT 387 385   Basic Metabolic Panel:  Recent Labs  Lab 03/27/20 0815 03/28/20 0041  NA 132* 134*  K 4.8 4.9  CL 98 101  CO2 24 20*  GLUCOSE 179* 77  BUN 21* 23*  CREATININE 0.70 0.65  CALCIUM 8.8* 8.9   IMAGING past 24 hours No results found.   PHYSICAL EXAM      Temp:  [98.1 F (36.7 C)-98.9 F (37.2 C)] 98.4 F (36.9 C) (10/12 1157) Pulse Rate:  [67-86] 82 (10/12 1157) Resp:  [16-21] 18 (10/12 1157) BP: (106-140)/(50-74) 140/68 (10/12 1157) SpO2:  [93 %-100 %] 98 % (10/12 1157) FiO2 (%):  [21 %] 21 % (10/12 1115)  General - Well nourished, well developed, in no apparent distress, on trach collar.  Ophthalmologic - fundi not visualized due to noncooperation.  Cardiovascular - Regular rhythm and rate.  Neuro - awake alert, eyes open, on trach collar, nonverbal, able to track bilaterally, no gaze deviation but left gaze preference, not blinking to visual threat on the right.  Able to follow simple midline one-step commands not able to follow peripheral commands.  Right facial droop, tongue protrusion not corporative.  Left upper extremity at least 4/5.  Left lower extremity at least 3/5.  Right upper extremity flaccid.  Right lower extremity slight withdraw to pain.  Right Babinski positive. Sensation, coordination not  corporative and gait not tested.   ASSESSMENT/PLAN Deborah [name] is a 49 y.o. female with history of difficulty to control DB, HLD, syncope presenting to [facility] with R hemiparesis, facial droop, left gaze, and global aphasia w/ glucose 400s where she was found to have L M1 occlusion and transferred to [facility] for IR.   Stroke:   L MCA infarct s/p IR w/ post procedure hemorrhage, infarct secondary to large vessel disease source  CT head [facility]) L MCA infarct w/ ASPECTS 6  CTA head & neck [facility]) L M2 cutoff  CT head L MCA territory infarct. ASPECTS 6.   CT perfusion 63 core L brain same as CT. 32 penumbra. 1.5 mismatch ratio.  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 Post IR hemorrhage.  CT head 9/21 1818 increased L MCA ischemic infarct w/ mass effect and partial effacement L lateral ventrilc w/ 16mm midline shift. Small foci L BG HT same w/ increased L perisylvian and L cerebral convexity hemorrhage.   CT head 9/22 0151 evolution large L MCA infarct w/ slightly worse effacement and mass effect w/ 17mm midline shift. Scattered foci hemorrhage in infarct same w/ contrast clearing. L M1 and proximal L MCA  branches w/ persistent hyperdensity.   CT head 9/22 0827  L MCA infarct and hemorrhages same. Increase in mass effect and effacement now 38mm midline shift. L M1 and proximal L MCA braches w/ persistent hyperdensity.   MRI 9/22 Large L MCA infarct w/ moderate infarct and SAH. Edema, 76mm midline shift.   MRA 9/22 L MCA 1 occlusion   CT head 9/24 L MCA infarct w/ mild increase in edema, 3mm midline shift    CT repeat 9/26 - Stable extent of left MCA territory infarct with small volume blood products. Midline shift continues to measure 9 mm.  2D Echo EF 60-65%. No source of embolus   Recent Zio patch Normal w/ infrequent PACs, PVCs 01/07/2020  May  consider further cardioembolic work up if further neuro improvement  LDL 174   HgbA1c 10.7   B12/TSH/RPR normal. ANA pending   Hypercoagulable work up negative - Antithrombin III mildly elevated - 122, homocysteine mildly elevated - 22.1   VTE prophylaxis - Lovenox 40 mg sq daily     clopidogrel 75 mg daily prior to admission, now on plavix for stroke prevention (allergic to ASA).    Therapy recommendations:  CIR  Disposition:  Pending - awaiting insurance prior auth  Acute Respiratory Failure  Secondary to stroke  Intubated for IR, left intubated post IR    Off sedation  CCM on board   Trach placed 03/13/20 - on trach collar  Has size 6 cuffed trach. Will need to go to cuffless prior to d/c. CCM following to downsize, plans for Monday 10/11. Janina Mayo must be at least 54d old if for SNF admission)  CCM on board for trach management     Cerebral Edema  MRI 9/22 with MLS @ 10mm  CT head 9/24 L MCA infarct w/ mild increase in edema, 41mm midline shift    CT repeat 9/26 - Stable extent of left MCA territory infarct with small volume blood products. Midline shift continues to measure 9 mm.  On 3% protocol - @75 ->50->NS-> off  23.4% x 1 (9/25)  Leukocytosis  WBC 13.4  (afebrile)  UA - WBC 11-20  CXR - bibasilar atx  Urine culture insignificant growth  Increased trach secretions - PRN suctioning  Finished Rocephin 5 day course  Hyperlipidemia  Home meds:  lipitor 20  LDL 174, goal < 70  On lipitor 80  Continue statin at discharge  Diabetes type II Uncontrolled  Home meds:  Glimepiride , lantus 21, liraglutide 1.8  HgbA1c 10.7, goal < 7.0  CBGs  Hyperglycemia much improved  SSI 0-15  On lantus 25 bid  novolog premeal 3U Q4 -> 5U Q4 -> 8U Q4->16U Q4->10U Q4  Changed TF formula from Jevity to Glucerna  Dysphagia . Secondary to stroke . NPO except meds . on TF @ 47 via cortrak  . On IVF @ 50 (mildly low BP) . On FW flush 20cc Q4 . MBS 10/6  - continue NPO and meds via alternative means.  09-29-1970 Speech on board  Tobacco abuse  Current smoker  Will provide smoking cessation when able    Other Stroke Risk Factors  UDS positive benzos and barb (done following IR procedure)   Obesity, Body mass index is 39.48 kg/m., recommend weight loss, diet and exercise as appropriate   Family hx stroke (paternal grandfather)  Migraines  Other Active Problems  Chronic pain, Anxiety  Hypokalemia, resolved - 4.9  Hyponatremia, Na 134  Anemia - Hgb - 10.1->11.1  SBP mildly low -  100 - 117 -> increase IV fluid to 50 cc / hr (EF 60 - 65%)  Hospital day # 21 Continue ongoing therapies and tracheostomy care.  Patient medically stable to be transferred to inpatient rehab when bed becomes available.  Delia Heady, MD  To contact Stroke Continuity provider, please refer to [email or web address]. After hours, contact General Neurology

## 2020-03-29 NOTE — Progress Notes (Signed)
Occupational Therapy Treatment Patient Details Name: Deborah [name] MRN: [id number] DOB: 09/12/1970 Today's Date: 03/29/2020    History of present illness 49 y/o female with history of IDDM, HLD, tobacco use disorder presented to [facility] for facial droop, right sided weakness and global aphasia. Found to have LVO of left M1 segment and was transferred to [facility]. Pt underwent left common carotid arteriogram followed by revascularization of L MCA, however it reoccluded due to sever distal M1 stenosis.Pt underwent trach on 9/26.   OT comments  Pt progressing towards acute OT goals. Able to tolerate sitting EOB about 15 minutes at min guard level. 3x STS with min-mod A +2, most successful when utilizing stedy frame as she is able to have RUE in a more supportive position this way. Pt engaged in therapy session and indicating solid motivation to work with therapies. Tearful at times. Goals updated and d/c plan updated to CIR.    Follow Up Recommendations  CIR    Equipment Recommendations  Other (comment) (defer to next venue of care)    Recommendations for Other Services      Precautions / Restrictions Precautions Precautions: Fall Precaution Comments: NG, R Hemi, +2, IV, trach collar Restrictions Weight Bearing Restrictions: No       Mobility Bed Mobility Overal bed mobility: Needs Assistance Bed Mobility: Supine to Sit Rolling: Max assist   Supine to sit: Max assist     General bed mobility comments: partial roll to left side then up to EOB position. Assist to advance R side and power up trunk.   Transfers Overall transfer level: Needs assistance Equipment used: Ambulation equipment used Transfers: Sit to/from Stand Sit to Stand: Min assist;Mod assist;+2 safety/equipment         General transfer comment: 3x from elevated EOB height. assist to powerup, steady and control descent    Balance Overall balance assessment: Needs assistance Sitting-balance support:  Feet supported;Single extremity supported;Bilateral upper extremity supported Sitting balance-Leahy Scale: Poor Sitting balance - Comments: close min guard to sit with BUE and BLE supported Postural control: Right lateral lean Standing balance support: Bilateral upper extremity supported Standing balance-Leahy Scale: Poor Standing balance comment: better control standing with use of stedy bars vs rw and assist.                            ADL either performed or assessed with clinical judgement   ADL Overall ADL's : Needs assistance/impaired                                       General ADL Comments: sat EOB about 15 minutes at close min guard level. Assist to position R side into supportive, weighbearing position.      Vision   Vision Assessment?: Vision impaired- to be further tested in functional context   Perception     Praxis      Cognition Arousal/Alertness: Awake/alert Behavior During Therapy: Flat affect Overall Cognitive Status: Difficult to assess Area of Impairment: Following commands;Problem solving;Attention;Safety/judgement                   Current Attention Level: Sustained   Following Commands: Follows one step commands consistently;Follows one step commands with increased time Safety/Judgement: Decreased awareness of safety;Decreased awareness of deficits   Problem Solving: Slow processing;Decreased initiation;Difficulty sequencing;Requires verbal cues;Requires tactile cues  Exercises     Shoulder Instructions       General Comments      Pertinent Vitals/ Pain       Pain Assessment: Faces Faces Pain Scale: Hurts a little bit Pain Location: generalized response to noxious stimuli  Home Living                                          Prior Functioning/Environment              Frequency  Min 2X/week        Progress Toward Goals  OT Goals(current goals can now be found in  the care plan section)  Progress towards OT goals: Progressing toward goals  Acute Rehab OT Goals Patient Stated Goal: unable to state ADL Goals Pt Will Perform Grooming: with mod assist;sitting Pt Will Transfer to Toilet: with max assist;stand pivot transfer;bedside commode Pt/caregiver will Perform Home Exercise Program: Increased ROM;Increased strength;Right Upper extremity;Left upper extremity Additional ADL Goal #1: Pt will utilize LUE to advance and position RUE with min assist.  Plan Discharge plan needs to be updated    Co-evaluation    PT/OT/SLP Co-Evaluation/Treatment: Yes Reason for Co-Treatment: Complexity of the patient's impairments (multi-system involvement);For patient/therapist safety;Necessary to address cognition/behavior during functional activity;To address functional/ADL transfers   OT goals addressed during session: ADL's and self-care;Strengthening/ROM      AM-PAC OT "6 Clicks" Daily Activity     Outcome Measure   Help from another person eating meals?: Total Help from another person taking care of personal grooming?: A Lot Help from another person toileting, which includes using toliet, bedpan, or urinal?: Total Help from another person bathing (including washing, rinsing, drying)?: Total Help from another person to put on and taking off regular upper body clothing?: Total Help from another person to put on and taking off regular lower body clothing?: Total 6 Click Score: 7    End of Session Equipment Utilized During Treatment: Gait belt;Other (comment) (stedy)  OT Visit Diagnosis: Muscle weakness (generalized) (M62.81);Hemiplegia and hemiparesis;Cognitive communication deficit (R41.841) Hemiplegia - Right/Left: Right Hemiplegia - dominant/non-dominant: Dominant Hemiplegia - caused by: Cerebral infarction   Activity Tolerance Patient tolerated treatment well;Patient limited by fatigue   Patient Left in chair;with call bell/phone within reach;with  chair alarm set   Nurse Communication          Time: 1478-2956 OT Time Calculation (min): 45 min  Charges: OT General Charges $OT Visit: 1 Visit OT Treatments $Self Care/Home Management : 8-22 mins  Raynald Kemp, OT Acute Rehabilitation Services Pager: [phone] Office: [phone]    Pilar Grammes 03/29/2020, 2:21 PM

## 2020-03-29 NOTE — Plan of Care (Signed)
Pt is alert. Pt has been tearful. #4 Trach cuffless. TF via coretrak and patent. Tele in place.  Problem: Education: Goal: Knowledge of General Education information will improve Description: Including pain rating scale, medication(s)/side effects and non-pharmacologic comfort measures Outcome: Progressing   Problem: Health Behavior/Discharge Planning: Goal: Ability to manage health-related needs will improve Outcome: Progressing   Problem: Clinical Measurements: Goal: Ability to maintain clinical measurements within normal limits will improve Outcome: Progressing Goal: Will remain free from infection Outcome: Progressing Goal: Diagnostic test results will improve Outcome: Progressing Goal: Respiratory complications will improve Outcome: Progressing Goal: Cardiovascular complication will be avoided Outcome: Progressing   Problem: Activity: Goal: Risk for activity intolerance will decrease Outcome: Progressing   Problem: Nutrition: Goal: Adequate nutrition will be maintained Outcome: Progressing   Problem: Coping: Goal: Level of anxiety will decrease Outcome: Progressing   Problem: Elimination: Goal: Will not experience complications related to bowel motility Outcome: Progressing Goal: Will not experience complications related to urinary retention Outcome: Progressing   Problem: Pain Managment: Goal: General experience of comfort will improve Outcome: Progressing   Problem: Safety: Goal: Ability to remain free from injury will improve Outcome: Progressing   Problem: Skin Integrity: Goal: Risk for impaired skin integrity will decrease Outcome: Progressing   Problem: Education: Goal: Knowledge of disease or condition will improve Outcome: Progressing Goal: Knowledge of secondary prevention will improve Outcome: Progressing Goal: Knowledge of patient specific risk factors addressed and post discharge goals established will improve Outcome: Progressing Goal:  Individualized Educational Video(s) Outcome: Progressing   Problem: Coping: Goal: Will verbalize positive feelings about self Outcome: Progressing Goal: Will identify appropriate support needs Outcome: Progressing   Problem: Health Behavior/Discharge Planning: Goal: Ability to manage health-related needs will improve Outcome: Progressing   Problem: Self-Care: Goal: Ability to participate in self-care as condition permits will improve Outcome: Progressing Goal: Verbalization of feelings and concerns over difficulty with self-care will improve Outcome: Progressing Goal: Ability to communicate needs accurately will improve Outcome: Progressing   Problem: Nutrition: Goal: Risk of aspiration will decrease Outcome: Progressing Goal: Dietary intake will improve Outcome: Progressing   Problem: Ischemic Stroke/TIA Tissue Perfusion: Goal: Complications of ischemic stroke/TIA will be minimized Outcome: Progressing

## 2020-03-30 ENCOUNTER — Encounter (HOSPITAL_COMMUNITY): Payer: Self-pay | Admitting: Physical Medicine and Rehabilitation

## 2020-03-30 ENCOUNTER — Inpatient Hospital Stay (HOSPITAL_COMMUNITY): Payer: Medicaid Other

## 2020-03-30 ENCOUNTER — Inpatient Hospital Stay (HOSPITAL_COMMUNITY)
Admission: RE | Admit: 2020-03-30 | Discharge: 2020-04-29 | DRG: 057 | Disposition: A | Discharge status: Home Health | Payer: Medicaid Other | Source: Intra-hospital | Attending: Physical Medicine and Rehabilitation | Admitting: Physical Medicine and Rehabilitation

## 2020-03-30 DIAGNOSIS — I69391 Dysphagia following cerebral infarction: Secondary | ICD-10-CM | POA: Diagnosis not present

## 2020-03-30 DIAGNOSIS — I69320 Aphasia following cerebral infarction: Secondary | ICD-10-CM | POA: Diagnosis not present

## 2020-03-30 DIAGNOSIS — Z79899 Other long term (current) drug therapy: Secondary | ICD-10-CM

## 2020-03-30 DIAGNOSIS — E785 Hyperlipidemia, unspecified: Secondary | ICD-10-CM | POA: Diagnosis present

## 2020-03-30 DIAGNOSIS — G8191 Hemiplegia, unspecified affecting right dominant side: Secondary | ICD-10-CM

## 2020-03-30 DIAGNOSIS — E11649 Type 2 diabetes mellitus with hypoglycemia without coma: Secondary | ICD-10-CM | POA: Diagnosis not present

## 2020-03-30 DIAGNOSIS — I63311 Cerebral infarction due to thrombosis of right middle cerebral artery: Secondary | ICD-10-CM

## 2020-03-30 DIAGNOSIS — D72829 Elevated white blood cell count, unspecified: Secondary | ICD-10-CM

## 2020-03-30 DIAGNOSIS — E669 Obesity, unspecified: Secondary | ICD-10-CM | POA: Diagnosis present

## 2020-03-30 DIAGNOSIS — K5901 Slow transit constipation: Secondary | ICD-10-CM

## 2020-03-30 DIAGNOSIS — I63319 Cerebral infarction due to thrombosis of unspecified middle cerebral artery: Secondary | ICD-10-CM | POA: Diagnosis present

## 2020-03-30 DIAGNOSIS — Z823 Family history of stroke: Secondary | ICD-10-CM

## 2020-03-30 DIAGNOSIS — Z886 Allergy status to analgesic agent status: Secondary | ICD-10-CM

## 2020-03-30 DIAGNOSIS — I69351 Hemiplegia and hemiparesis following cerebral infarction affecting right dominant side: Principal | ICD-10-CM

## 2020-03-30 DIAGNOSIS — Z7902 Long term (current) use of antithrombotics/antiplatelets: Secondary | ICD-10-CM | POA: Diagnosis not present

## 2020-03-30 DIAGNOSIS — I959 Hypotension, unspecified: Secondary | ICD-10-CM | POA: Diagnosis not present

## 2020-03-30 DIAGNOSIS — R1319 Other dysphagia: Secondary | ICD-10-CM

## 2020-03-30 DIAGNOSIS — Z91018 Allergy to other foods: Secondary | ICD-10-CM | POA: Diagnosis not present

## 2020-03-30 DIAGNOSIS — Z794 Long term (current) use of insulin: Secondary | ICD-10-CM

## 2020-03-30 DIAGNOSIS — D62 Acute posthemorrhagic anemia: Secondary | ICD-10-CM

## 2020-03-30 DIAGNOSIS — E1165 Type 2 diabetes mellitus with hyperglycemia: Secondary | ICD-10-CM | POA: Diagnosis present

## 2020-03-30 DIAGNOSIS — R0902 Hypoxemia: Secondary | ICD-10-CM

## 2020-03-30 DIAGNOSIS — R339 Retention of urine, unspecified: Secondary | ICD-10-CM

## 2020-03-30 DIAGNOSIS — Z8249 Family history of ischemic heart disease and other diseases of the circulatory system: Secondary | ICD-10-CM

## 2020-03-30 DIAGNOSIS — F17210 Nicotine dependence, cigarettes, uncomplicated: Secondary | ICD-10-CM | POA: Diagnosis present

## 2020-03-30 DIAGNOSIS — R1310 Dysphagia, unspecified: Secondary | ICD-10-CM

## 2020-03-30 DIAGNOSIS — R1312 Dysphagia, oropharyngeal phase: Secondary | ICD-10-CM | POA: Diagnosis present

## 2020-03-30 DIAGNOSIS — J9811 Atelectasis: Secondary | ICD-10-CM | POA: Diagnosis present

## 2020-03-30 DIAGNOSIS — Z6838 Body mass index (BMI) 38.0-38.9, adult: Secondary | ICD-10-CM

## 2020-03-30 DIAGNOSIS — J9600 Acute respiratory failure, unspecified whether with hypoxia or hypercapnia: Secondary | ICD-10-CM

## 2020-03-30 DIAGNOSIS — Z930 Tracheostomy status: Secondary | ICD-10-CM

## 2020-03-30 DIAGNOSIS — G936 Cerebral edema: Secondary | ICD-10-CM

## 2020-03-30 DIAGNOSIS — I63512 Cerebral infarction due to unspecified occlusion or stenosis of left middle cerebral artery: Secondary | ICD-10-CM | POA: Diagnosis not present

## 2020-03-30 DIAGNOSIS — I63312 Cerebral infarction due to thrombosis of left middle cerebral artery: Secondary | ICD-10-CM | POA: Diagnosis not present

## 2020-03-30 HISTORY — DX: Cerebral infarction, unspecified: I63.9

## 2020-03-30 LAB — BASIC METABOLIC PANEL
Anion gap: 9 (ref 5–15)
BUN: 21 mg/dL — ABNORMAL HIGH (ref 6–20)
CO2: 24 mmol/L (ref 22–32)
Calcium: 9.1 mg/dL (ref 8.9–10.3)
Chloride: 104 mmol/L (ref 98–111)
Creatinine, Ser: 0.77 mg/dL (ref 0.44–1.00)
GFR, Estimated: >60 mL/min (ref >60)
Glucose, Bld: 116 mg/dL — ABNORMAL HIGH (ref 70–99)
Potassium: 4 mmol/L (ref 3.5–5.1)
Sodium: 137 mmol/L (ref 135–145)

## 2020-03-30 LAB — GLUCOSE, CAPILLARY
Glucose-Capillary: 113 mg/dL — ABNORMAL HIGH (ref 70–99)
Glucose-Capillary: 123 mg/dL — ABNORMAL HIGH (ref 70–99)
Glucose-Capillary: 124 mg/dL — ABNORMAL HIGH (ref 70–99)
Glucose-Capillary: 152 mg/dL — ABNORMAL HIGH (ref 70–99)
Glucose-Capillary: 114 mg/dL — ABNORMAL HIGH (ref 70–99)
Glucose-Capillary: 128 mg/dL — ABNORMAL HIGH (ref 70–99)

## 2020-03-30 LAB — FACTOR 5 LEIDEN

## 2020-03-30 LAB — CBC
HCT: 34 % — ABNORMAL LOW (ref 36.0–46.0)
Hemoglobin: 10.9 g/dL — ABNORMAL LOW (ref 12.0–15.0)
MCH: 31.8 pg (ref 26.0–34.0)
MCHC: 32.1 g/dL (ref 30.0–36.0)
MCV: 99.1 fL (ref 80.0–100.0)
Platelets: 450 10*3/uL — ABNORMAL HIGH (ref 150–400)
RBC: 3.43 MIL/uL — ABNORMAL LOW (ref 3.87–5.11)
RDW: 14.3 % (ref 11.5–15.5)
WBC: 15.6 10*3/uL — ABNORMAL HIGH (ref 4.0–10.5)
nRBC: 0 % (ref 0.0–0.2)

## 2020-03-30 LAB — PROTHROMBIN GENE MUTATION

## 2020-03-30 MED ORDER — POLYETHYLENE GLYCOL 3350 17 G PO PACK
17.0000 g | PACK | Freq: Every day | ORAL | Status: DC
  Administered 2020-03-31 – 2020-04-07 (×4): 17 g
  Filled 2020-03-30 (×6): qty 1

## 2020-03-30 MED ORDER — DEXTROSE 50 % IV SOLN: 0.0000 mL | INTRAVENOUS | Status: DC | PRN

## 2020-03-30 MED ORDER — SENNOSIDES-DOCUSATE SODIUM 8.6-50 MG PO TABS: 1.0000 | ORAL_TABLET | Freq: Every evening | ORAL | Status: DC | PRN

## 2020-03-30 MED ORDER — PROSOURCE TF PO LIQD
45.0000 mL | Freq: Every day | ORAL | Status: DC
  Administered 2020-03-31: 45 mL
  Filled 2020-03-30: qty 45

## 2020-03-30 MED ORDER — ATORVASTATIN CALCIUM 80 MG PO TABS: 80.0000 mg | ORAL_TABLET | Freq: Every day | ORAL | Status: DC

## 2020-03-30 MED ORDER — INSULIN ASPART 100 UNIT/ML ~~LOC~~ SOLN: 0.0000 [IU] | SUBCUTANEOUS | 11 refills | Status: DC

## 2020-03-30 MED ORDER — GLUCERNA 1.5 CAL PO LIQD
1000.0000 mL | ORAL | Status: DC
  Administered 2020-03-31: 1000 mL
  Filled 2020-03-30 (×2): qty 1000

## 2020-03-30 MED ORDER — CLOPIDOGREL BISULFATE 75 MG PO TABS
75.0000 mg | ORAL_TABLET | Freq: Every day | ORAL | Status: DC
  Administered 2020-03-31 – 2020-04-20 (×21): 75 mg
  Filled 2020-03-30 (×21): qty 1

## 2020-03-30 MED ORDER — ACETAMINOPHEN 650 MG RE SUPP: 650.0000 mg | RECTAL | Status: DC | PRN

## 2020-03-30 MED ORDER — CHLORHEXIDINE GLUCONATE 0.12% ORAL RINSE (MEDLINE KIT): 15.0000 mL | Freq: Two times a day (BID) | OROMUCOSAL | 0 refills | Status: DC

## 2020-03-30 MED ORDER — GLUCERNA 1.5 CAL PO LIQD: 1000.0000 mL | ORAL | Status: DC

## 2020-03-30 MED ORDER — ACETAMINOPHEN 325 MG PO TABS
650.0000 mg | ORAL_TABLET | ORAL | Status: DC | PRN
  Administered 2020-04-01 – 2020-04-26 (×19): 650 mg via ORAL
  Filled 2020-03-30 (×17): qty 2

## 2020-03-30 MED ORDER — POLYETHYLENE GLYCOL 3350 17 G PO PACK: 17.0000 g | PACK | Freq: Every day | ORAL | 0 refills | Status: DC

## 2020-03-30 MED ORDER — ENOXAPARIN SODIUM 40 MG/0.4ML ~~LOC~~ SOLN: 40.0000 mg | SUBCUTANEOUS | Status: DC

## 2020-03-30 MED ORDER — DOCUSATE SODIUM 50 MG/5ML PO LIQD: 100.0000 mg | Freq: Two times a day (BID) | ORAL | 0 refills | Status: DC

## 2020-03-30 MED ORDER — ADULT MULTIVITAMIN W/MINERALS CH
1.0000 | ORAL_TABLET | Freq: Every day | ORAL | Status: DC
  Administered 2020-03-31 – 2020-04-29 (×30): 1
  Filled 2020-03-30 (×30): qty 1

## 2020-03-30 MED ORDER — ACETAMINOPHEN 160 MG/5ML PO SOLN
650.0000 mg | ORAL | Status: DC | PRN
  Administered 2020-04-08 – 2020-04-28 (×5): 650 mg
  Filled 2020-03-30 (×6): qty 20.3

## 2020-03-30 MED ORDER — SODIUM CHLORIDE 0.9 % IV SOLN: 50.0000 mL | INTRAVENOUS | 0 refills | Status: DC

## 2020-03-30 MED ORDER — CLOPIDOGREL BISULFATE 75 MG PO TABS: 75.0000 mg | ORAL_TABLET | Freq: Every day | ORAL | Status: DC

## 2020-03-30 MED ORDER — BETHANECHOL CHLORIDE 10 MG PO TABS: 10.0000 mg | ORAL_TABLET | Freq: Three times a day (TID) | ORAL | Status: DC

## 2020-03-30 MED ORDER — INSULIN GLARGINE 100 UNIT/ML ~~LOC~~ SOLN
25.0000 [IU] | Freq: Two times a day (BID) | SUBCUTANEOUS | Status: DC
  Administered 2020-03-30 – 2020-04-07 (×16): 25 [IU] via SUBCUTANEOUS
  Filled 2020-03-30 (×19): qty 0.25

## 2020-03-30 MED ORDER — INSULIN ASPART 100 UNIT/ML ~~LOC~~ SOLN: 10.0000 [IU] | SUBCUTANEOUS | 11 refills | Status: DC

## 2020-03-30 MED ORDER — ENOXAPARIN SODIUM 40 MG/0.4ML ~~LOC~~ SOLN
40.0000 mg | SUBCUTANEOUS | Status: DC
  Administered 2020-03-30 – 2020-04-23 (×25): 40 mg via SUBCUTANEOUS
  Filled 2020-03-30 (×25): qty 0.4

## 2020-03-30 MED ORDER — ACETAMINOPHEN 325 MG PO TABS: 650.0000 mg | ORAL_TABLET | ORAL | Status: AC | PRN

## 2020-03-30 MED ORDER — INSULIN ASPART 100 UNIT/ML ~~LOC~~ SOLN
10.0000 [IU] | SUBCUTANEOUS | Status: DC
  Administered 2020-03-30 – 2020-04-21 (×124): 10 [IU] via SUBCUTANEOUS

## 2020-03-30 MED ORDER — DOCUSATE SODIUM 50 MG/5ML PO LIQD
100.0000 mg | Freq: Two times a day (BID) | ORAL | Status: DC
  Administered 2020-03-30 – 2020-04-29 (×46): 100 mg
  Filled 2020-03-30 (×54): qty 10

## 2020-03-30 MED ORDER — INSULIN GLARGINE 100 UNIT/ML ~~LOC~~ SOLN: 25.0000 [IU] | Freq: Two times a day (BID) | SUBCUTANEOUS | 11 refills | Status: DC

## 2020-03-30 MED ORDER — ADULT MULTIVITAMIN W/MINERALS CH: 1.0000 | ORAL_TABLET | Freq: Every day | ORAL | Status: DC

## 2020-03-30 MED ORDER — BETHANECHOL CHLORIDE 10 MG PO TABS
10.0000 mg | ORAL_TABLET | Freq: Three times a day (TID) | ORAL | Status: DC
  Administered 2020-03-30 – 2020-04-29 (×87): 10 mg
  Filled 2020-03-30 (×88): qty 1

## 2020-03-30 MED ORDER — PROSOURCE TF PO LIQD: 45.0000 mL | Freq: Every day | ORAL | Status: DC

## 2020-03-30 MED ORDER — ATORVASTATIN CALCIUM 80 MG PO TABS
80.0000 mg | ORAL_TABLET | Freq: Every day | ORAL | Status: DC
  Administered 2020-03-31 – 2020-04-29 (×30): 80 mg
  Filled 2020-03-30 (×28): qty 1
  Filled 2020-03-30: qty 2

## 2020-03-30 MED ORDER — INSULIN ASPART 100 UNIT/ML ~~LOC~~ SOLN
0.0000 [IU] | SUBCUTANEOUS | Status: DC
  Administered 2020-03-30 – 2020-04-01 (×8): 2 [IU] via SUBCUTANEOUS
  Administered 2020-04-01: 3 [IU] via SUBCUTANEOUS
  Administered 2020-04-02 (×2): 2 [IU] via SUBCUTANEOUS
  Administered 2020-04-02: 3 [IU] via SUBCUTANEOUS
  Administered 2020-04-03 (×3): 2 [IU] via SUBCUTANEOUS
  Administered 2020-04-03: 5 [IU] via SUBCUTANEOUS
  Administered 2020-04-04 (×2): 2 [IU] via SUBCUTANEOUS
  Administered 2020-04-04: 3 [IU] via SUBCUTANEOUS
  Administered 2020-04-04 – 2020-04-05 (×2): 2 [IU] via SUBCUTANEOUS
  Administered 2020-04-05: 3 [IU] via SUBCUTANEOUS
  Administered 2020-04-05 – 2020-04-07 (×6): 2 [IU] via SUBCUTANEOUS
  Administered 2020-04-07: 12 [IU] via SUBCUTANEOUS
  Administered 2020-04-08: 2 [IU] via SUBCUTANEOUS
  Administered 2020-04-08: 3 [IU] via SUBCUTANEOUS
  Administered 2020-04-08 – 2020-04-11 (×13): 2 [IU] via SUBCUTANEOUS
  Administered 2020-04-11 (×2): 3 [IU] via SUBCUTANEOUS
  Administered 2020-04-11 – 2020-04-12 (×2): 2 [IU] via SUBCUTANEOUS
  Administered 2020-04-12: 3 [IU] via SUBCUTANEOUS
  Administered 2020-04-12 – 2020-04-13 (×4): 2 [IU] via SUBCUTANEOUS
  Administered 2020-04-13: 3 [IU] via SUBCUTANEOUS
  Administered 2020-04-13: 2 [IU] via SUBCUTANEOUS
  Administered 2020-04-13: 5 [IU] via SUBCUTANEOUS
  Administered 2020-04-13: 3 [IU] via SUBCUTANEOUS
  Administered 2020-04-14 (×2): 2 [IU] via SUBCUTANEOUS
  Administered 2020-04-14 (×2): 3 [IU] via SUBCUTANEOUS
  Administered 2020-04-14: 2 [IU] via SUBCUTANEOUS
  Administered 2020-04-15 (×2): 3 [IU] via SUBCUTANEOUS
  Administered 2020-04-15: 2 [IU] via SUBCUTANEOUS
  Administered 2020-04-15 – 2020-04-16 (×2): 3 [IU] via SUBCUTANEOUS
  Administered 2020-04-16: 2 [IU] via SUBCUTANEOUS
  Administered 2020-04-16 – 2020-04-17 (×2): 3 [IU] via SUBCUTANEOUS
  Administered 2020-04-17 (×3): 2 [IU] via SUBCUTANEOUS
  Administered 2020-04-17: 3 [IU] via SUBCUTANEOUS
  Administered 2020-04-18 (×2): 2 [IU] via SUBCUTANEOUS
  Administered 2020-04-18 (×2): 3 [IU] via SUBCUTANEOUS
  Administered 2020-04-18: 2 [IU] via SUBCUTANEOUS
  Administered 2020-04-18 – 2020-04-19 (×2): 3 [IU] via SUBCUTANEOUS
  Administered 2020-04-19 – 2020-04-20 (×5): 2 [IU] via SUBCUTANEOUS
  Administered 2020-04-20 (×2): 3 [IU] via SUBCUTANEOUS
  Administered 2020-04-21 (×2): 2 [IU] via SUBCUTANEOUS
  Administered 2020-04-21: 11 [IU] via SUBCUTANEOUS
  Administered 2020-04-21 – 2020-04-22 (×2): 3 [IU] via SUBCUTANEOUS
  Administered 2020-04-22: 2 [IU] via SUBCUTANEOUS
  Administered 2020-04-22 (×3): 3 [IU] via SUBCUTANEOUS
  Administered 2020-04-23: 2 [IU] via SUBCUTANEOUS
  Administered 2020-04-23: 3 [IU] via SUBCUTANEOUS
  Administered 2020-04-23: 2 [IU] via SUBCUTANEOUS
  Administered 2020-04-23: 5 [IU] via SUBCUTANEOUS
  Administered 2020-04-24: 3 [IU] via SUBCUTANEOUS
  Administered 2020-04-24 (×2): 2 [IU] via SUBCUTANEOUS
  Administered 2020-04-24 (×3): 3 [IU] via SUBCUTANEOUS
  Administered 2020-04-25: 2 [IU] via SUBCUTANEOUS
  Administered 2020-04-25 (×4): 3 [IU] via SUBCUTANEOUS
  Administered 2020-04-25: 5 [IU] via SUBCUTANEOUS
  Administered 2020-04-26: 3 [IU] via SUBCUTANEOUS
  Administered 2020-04-26: 2 [IU] via SUBCUTANEOUS
  Administered 2020-04-26: 5 [IU] via SUBCUTANEOUS
  Administered 2020-04-26: 8 [IU] via SUBCUTANEOUS
  Administered 2020-04-26: 3 [IU] via SUBCUTANEOUS
  Administered 2020-04-27: 2 [IU] via SUBCUTANEOUS
  Administered 2020-04-27: 3 [IU] via SUBCUTANEOUS
  Administered 2020-04-27: 5 [IU] via SUBCUTANEOUS
  Administered 2020-04-28: 3 [IU] via SUBCUTANEOUS
  Administered 2020-04-28: 2 [IU] via SUBCUTANEOUS
  Administered 2020-04-28: 5 [IU] via SUBCUTANEOUS
  Administered 2020-04-28: 2 [IU] via SUBCUTANEOUS
  Administered 2020-04-28: 3 [IU] via SUBCUTANEOUS
  Administered 2020-04-29 (×3): 2 [IU] via SUBCUTANEOUS

## 2020-03-30 NOTE — Progress Notes (Signed)
Inpatient Rehabilitation Medication Review by a Pharmacist  A complete drug regimen review was completed for this patient to identify any potential clinically significant medication issues.  Clinically significant medication issues were identified:  no  Check AMION for pharmacist assigned to patient if future medication questions/issues arise during this admission.  Pharmacist comments:   Time spent performing this drug regimen review (minutes):  10   Fredonia Highland, PharmD, BCPS Clinical Pharmacist Please check AMION for all [facility] Pharmacy numbers 03/30/2020

## 2020-03-30 NOTE — Progress Notes (Signed)
Jamse Arn, MD  Physician  Physical Medicine and Rehabilitation  PMR Pre-admission      Addendum  Date of Service:  03/25/2020  4:16 PM      Related encounter: ED to Hosp-Admission (Discharged) from 03/08/2020 in [facility]       Show:Clear all [x] Manual[x] Template[x] Copied  Added by: [x] Damali Broadfoot, Vertis Kelch, RN[x] Patel, Ankit [place], MD[x] Michel Santee, PT  [] Hover for details PMR Admission Coordinator Pre-Admission Assessment   Patient: Deborah [name] is an 49 y.o., female MRN: [id number] DOB: 1971-02-17 Height: 5' (152.4 cm) Weight: 90.6 kg                                                                                                                                                  Insurance Information HMO:     PPO:      PCP:      IPA:      80/20:      OTHER:  PRIMARY: [employer]      Policy#: [id number]      Subscriber: pt CM Name: Almyra Free    Phone#: [phone] ext [phone]     Fax#: [phone] Pre-Cert#: [id number] approved for 7 days      Employer:  Benefits:  Phone #: [phone]     Name:  Eff. Date: active 12/17/2019     Deduct:     Out of Pocket Max:       Life Max:   CIR: 100%      SNF: 100% Outpatient: $3 Per visit     Co-Pay:  Home Health: 100%      Co-Pay:  DME: 100%     Co-Pay:  Providers: in network SECONDARY:       Policy#:       Phone#:    Development worker, community:       Phone#:    The Engineer, petroleum" for patients in Inpatient Rehabilitation Facilities with attached "Privacy Act [place] Records" was provided and verbally reviewed with: N/A   Emergency Contact Information Contact Information       Name Relation Home Work Mobile    [place], [place]. Son     [phone]    [place],Jason Significant other     [phone]    Lenox Ahr     [phone]         Current Medical History  Patient Admitting Diagnosis: L MCA with post re-vascularization hemorrhage     History of Present Illness:  Deborah [name] is a 49 year old right-handed female history of diabetes mellitus hyperlipidemia and tobacco abuse.  She presented on 03/08/2020 with right side weakness and aphasia to an outside hospital.  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  Urine drug screen positive benzos and barbiturates.  CTA confirming a left M2 cutoff.  [facility]  underwent thrombectomy per interventional radiology.  MRI showed large territory infarct with moderate hemorrhage and mild subarachnoid hemorrhage.  Edema with mass-effect 7 mm right side midline shift.  Echocardiogram with ejection fraction of 60 to 65% no wall motion abnormalities.  EEG negative for seizure.  She required intubation for extended period of time underwent tracheostomy 03/13/2020 per Dr. Ina Homes.  She was downsized to #4 cuffless trach 03/28/2020.  She is currently n.p.o. with alternative means of nutritional support.  Maintain on Plavix for CVA prophylaxis.  Subcutaneous Lovenox for DVT prophylaxis.  Bouts of urinary retention placed on low-dose Urecholine.     Complete NIHSS TOTAL: 16 Glasgow Coma Scale Score: 11   Past Medical History      Past Medical History:  Diagnosis Date  . DM (diabetes mellitus), type 2 ([place])    . Hyperlipidemia    . Hypersomnia    . Sprain of right ankle, initial encounter    . Syncope        Family History  family history includes Heart Problems in her father; Heart defect in her maternal grandmother and mother; Stroke in her paternal grandfather.   Prior Rehab/Hospitalizations:  Has the patient had prior rehab or hospitalizations prior to admission? No   Has the patient had major surgery during 100 days prior to admission? Yes   Current Medications    Current Facility-Administered Medications:  .  0.9 %  sodium chloride infusion, , Intravenous, Continuous, Rinehuls, Early Chars, PA-C, Last Rate: 50 mL/hr at 03/30/20 0409, New Bag at 03/30/20 0409 .   acetaminophen (TYLENOL) tablet 650 mg, 650 mg, Oral, Q4H PRN, 650 mg at 03/20/20 0948 **OR** acetaminophen (TYLENOL) 160 MG/5ML solution 650 mg, 650 mg, Per Tube, Q4H PRN, 650 mg at 03/27/20 1522 **OR** acetaminophen (TYLENOL) suppository 650 mg, 650 mg, Rectal, Q4H PRN, Bhagat, Srishti L, MD .  atorvastatin (LIPITOR) tablet 80 mg, 80 mg, Per Tube, Daily, Rosalin Hawking, MD, 80 mg at 03/30/20 1112 .  bethanechol (URECHOLINE) tablet 10 mg, 10 mg, Per Tube, TID, Rosalin Hawking, MD, 10 mg at 03/30/20 1112 .  chlorhexidine gluconate (MEDLINE KIT) (PERIDEX) 0.12 % solution 15 mL, 15 mL, Mouth Rinse, BID, Hunsucker, Bonna Gains, MD, 15 mL at 03/30/20 0810 .  Chlorhexidine Gluconate Cloth 2 % PADS 6 each, 6 each, Topical, Daily, Hunsucker, Bonna Gains, MD, 6 each at 03/29/20 1057 .  clopidogrel (PLAVIX) tablet 75 mg, 75 mg, Per Tube, Daily, Rosalin Hawking, MD, 75 mg at 03/30/20 1111 .  dextrose 50 % solution 0-50 mL, 0-50 mL, Intravenous, PRN, Bloomfield, Carley D, DO .  docusate (COLACE) 50 MG/5ML liquid 100 mg, 100 mg, Per Tube, BID, [place], Carley D, DO, 100 mg at 03/28/20 2123 .  enoxaparin (LOVENOX) injection 40 mg, 40 mg, Subcutaneous, Q24H, Biby, Sharon L, NP, 40 mg at 03/29/20 1720 .  feeding supplement (GLUCERNA 1.5 CAL) liquid 1,000 mL, 1,000 mL, Per Tube, Continuous, Rosalin Hawking, MD, Last Rate: 45 mL/hr at 03/29/20 1130, 1,000 mL at 03/29/20 1130 .  feeding supplement (PROSource TF) liquid 45 mL, 45 mL, Per Tube, Daily, Rosalin Hawking, MD, 45 mL at 03/30/20 1112 .  insulin aspart (novoLOG) injection 0-15 Units, 0-15 Units, Subcutaneous, Q4H, Hunsucker, Bonna Gains, MD, 3 Units at 03/30/20 1201 .  insulin aspart (novoLOG) injection 10 Units, 10 Units, Subcutaneous, Q4H, Rosalin Hawking, MD, 10 Units at 03/30/20 1201 .  insulin glargine (LANTUS) injection 25 Units, 25 Units, Subcutaneous, BID, Rosalin Hawking, MD, 25 Units at 03/30/20 1112 .  labetalol (NORMODYNE) injection 5-20 mg, 5-20 mg, Intravenous, Q10 min PRN, Rosalin Hawking, MD .  MEDLINE mouth rinse, 15 mL, Mouth Rinse, 10 times per day, Hunsucker, Bonna Gains, MD, 15 mL at 03/30/20 0405 .  multivitamin with minerals tablet 1 tablet, 1 tablet, Per Tube, Daily, Bloomfield, Carley D, DO, 1 tablet at 03/30/20 1112 .  polyethylene glycol (MIRALAX / GLYCOLAX) packet 17 g, 17 g, Per Tube, Daily, Bloomfield, Carley D, DO, 17 g at 03/28/20 0903 .  senna-docusate (Senokot-S) tablet 1 tablet, 1 tablet, Per Tube, QHS PRN, Levada Dy, Dwayne A, RPH   Patients Current Diet:  Diet Order                  Diet NPO time specified  Diet effective midnight                         Precautions / Restrictions Precautions Precautions: Fall Precaution Comments: NG, R Hemi, +2, IV, trach collar Restrictions Weight Bearing Restrictions: No    Has the patient had 2 or more falls or a fall with injury in the past year?No   Prior Activity Level Limited Community (1-2x/wk): on partial disability due to kidney diseaes and diabetic glaucoma, but taking care of grandkids during the day, no DME at baseline   Prior Functional Level Prior Function Comments: Per palliative note, Pt was ambulatory and performing her own ADL   Self Care: Did the patient need help bathing, dressing, using the toilet or eating?  Independent   Indoor Mobility: Did the patient need assistance with walking from room to room (with or without device)? Independent   Stairs: Did the patient need assistance with internal or external stairs (with or without device)? Independent   Functional Cognition: Did the patient need help planning regular tasks such as shopping or remembering to take medications? Independent   Home Assistive Devices / Equipment   Prior Device Use: Indicate devices/aids used by the patient prior to current illness, exacerbation or injury? None of the above   Current Functional Level Cognition   Arousal/Alertness: Awake/alert Overall Cognitive Status: Difficult to  assess Difficult to assess due to: Impaired communication, Tracheostomy Current Attention Level: Sustained Orientation Level: Oriented to person Following Commands: Follows one step commands consistently, Follows one step commands with increased time Safety/Judgement: Decreased awareness of safety, Decreased awareness of deficits General Comments: Pt emotional during session, believed to be from frustration at her current functional status. Did not appear to be from pain. Pt able to look to therapist when her name was stated. Required continual cues to make pt aware of R LE and UE, with min to no success as pt continued to move without acknowledging/assisting them. Pt impulsive and requires cues to maintain safety. Attention: Focused Focused Attention: Appears intact    Extremity Assessment (includes Sensation/Coordination)   Upper Extremity Assessment: RUE deficits/detail, LUE deficits/detail RUE Deficits / Details: flaccid RUE RUE Coordination: decreased fine motor, decreased gross motor LUE Deficits / Details: 3/5 gross strength LUE Coordination: decreased fine motor, decreased gross motor  Lower Extremity Assessment: Defer to PT evaluation RLE Deficits / Details: RLE is flaccid, withdraws to pain, PROM WFL, no AROM noted LLE Deficits / Details: ankle 3/5 PF/DF, 2-/5 knee extension and flexion, 2-/5 hip external and internal rotation, no active hip flexion/abduction/adduction noted     ADLs   Overall ADL's : Needs assistance/impaired Grooming: Maximal assistance, Total assistance, Cueing for sequencing, Bed level Grooming Details (indicate cue type  and reason): hand over hand for hand to face, able to grasp wash cloth, and perseverating on washing eyes, no attention to R side of face. Toileting- Clothing Manipulation and Hygiene: Moderate assistance, +2 for physical assistance, +2 for safety/equipment, Cueing for sequencing, Cueing for safety, Bed level Toileting - Clothing Manipulation  Details (indicate cue type and reason): Rolling to complete peri-care Functional mobility during ADLs: Maximal assistance, +2 for physical assistance, +2 for safety/equipment General ADL Comments: sat EOB about 15 minutes at close min guard level. Assist to position R side into supportive, weighbearing position.      Mobility   Overal bed mobility: Needs Assistance Bed Mobility: Supine to Sit, Rolling Rolling: Max assist Sidelying to sit: Max assist Supine to sit: Max assist Sit to supine: Total assist General bed mobility comments: partial roll to left side then up to EOB position. Assist to advance R side and power up trunk.      Transfers   Overall transfer level: Needs assistance Equipment used: Ambulation equipment used Transfer via Lift Equipment: Stedy Transfers: Sit to/from Stand Sit to Stand: Min assist, Mod assist, +2 safety/equipment Stand pivot transfers: +2 safety/equipment General transfer comment: 3x from elevated EOB height. assist to powerup, steady and control descent. 1x to RW, 1x to posterior aspect of bedside chair, and 1x to stedy. TCs applied to R quad with R knee block on occasion to extend knee, with no activation noted from pt.     Ambulation / Gait / Stairs / Wheelchair Mobility   Ambulation/Gait Ambulation/Gait assistance:  (unable) General Gait Details: unable     Posture / Balance Dynamic Sitting Balance Sitting balance - Comments: close min guard to sit with BUE and BLE supported Balance Overall balance assessment: Needs assistance Sitting-balance support: Feet supported, Single extremity supported, Bilateral upper extremity supported Sitting balance-Leahy Scale: Poor Sitting balance - Comments: close min guard to sit with BUE and BLE supported Postural control: Right lateral lean Standing balance support: Bilateral upper extremity supported Standing balance-Leahy Scale: Zero Standing balance comment: better control standing with use of stedy bars  vs rw and assist. tends to lean excessively to R, requiring maxAx2 to maintain static standing balance with TCs at R quad to extend knee with R knee block. Pt stood 3x for ~10-30 sec each bout.      Special needs/care consideration Trach size #4 cuffless, Skin new trach and Diabetic management yes   Trach downsized last 10/11 Cortrak 43 inches right nare placed 9/24    Previous Home Environment  Living Arrangements: Spouse/significant other, Children Available Help at Discharge: Family, Available 24 hours/day Type of Home: Mobile home Home Layout: One level Home Access: Stairs to enter Entrance Stairs-Rails: Right, Left Entrance Stairs-Number of Steps: ~7 Bathroom Shower/Tub: Multimedia programmer: Programmer, systems: No Home Care Services: No Additional Comments: difficult to determine, pt intubated, no family present   Discharge Living Setting Plans for Discharge Living Setting: Patient's home Type of Home at Discharge: Mobile home Discharge Home Layout: One level Discharge Home Access: Stairs to enter Entrance Stairs-Rails: Right, Left Entrance Stairs-Number of Steps: ~7 Discharge Bathroom Shower/Tub: Walk-in shower Discharge Bathroom Toilet: Standard Discharge Bathroom Accessibility: No Does the patient have any problems obtaining your medications?: No   Social/Family/Support Systems Patient Roles: Spouse Anticipated Caregiver: [name] (son), and [name] (s/o) Anticipated Caregiver's Contact Information: [name] [phone]; [name] [phone] Ability/Limitations of Caregiver: one works days, one works nights Caregiver Availability: 24/7 Discharge Plan Discussed with Primary Caregiver: Yes Is Caregiver In  Agreement with Plan?: Yes Does Caregiver/Family have Issues with Lodging/Transportation while Pt is in Rehab?: No   Goals Patient/Family Goal for Rehab: PT/OT min/mod A, SLP min assist to mod assist Expected length of stay: 22-25 days Additional  Information: cortrak, trach Pt/Family Agrees to Admission and willing to participate: Yes Program Orientation Provided & Reviewed with Pt/Caregiver Including Roles  & Responsibilities: Yes  Barriers to Discharge: Home environment access/layout, Therapist, nutritional, Insurance for SNF coverage     Decrease burden of Care through IP rehab admission: n/a   Possible need for SNF placement upon discharge: not anticipated   Patient Condition: This patient's medical and functional status has changed since the consult dated: 03/25/2020 in which the Rehabilitation Physician determined and documented that the patient's condition is appropriate for intensive rehabilitative care in an inpatient rehabilitation facility. See "History of Present Illness" (above) for medical update. Functional changes are: mod to max assist overall. Patient's medical and functional status update has been discussed with the Rehabilitation physician and patient remains appropriate for inpatient rehabilitation. Will admit to inpatient rehab today.   Preadmission Screen Completed By: Shann Medal, PT, DPT with updates by  Cleatrice Burke, RN, 03/30/2020 12:11 PM ______________________________________________________________________   Discussed status with Dr. Posey Pronto on 03/30/2020 at  1211 and received approval for admission today.   Admission Coordinator: Shann Medal, PT, DPT  Cleatrice Burke, time 9022 Date 03/30/2020         Revision History                               Note Details  Author Jamse Arn, MD File Time 03/30/2020 12:38 PM  Author Type Physician Status Addendum  Last Editor Jamse Arn, MD Service Physical Medicine and [place] # [id number] Admit Date 03/30/2020

## 2020-03-30 NOTE — H&P (Signed)
Physical Medicine and Rehabilitation Admission H&P    HPI: Deborah [name] is a 49 year old right-handed female history of diabetes mellitus hyperlipidemia and tobacco abuse. History taken from chart review, niece, and therapies due to aphasia. She presented on 03/08/2020 with right side hemiparesis and aphasia to an outside hospital.  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  Urine drug screen positive benzos and barbiturates.  CTA confirming a left M2 cutoff.  She was brought to [facility] underwent thrombectomy per interventional radiology.  MRI showed large territory infarct with moderate hemorrhage and mild subarachnoid hemorrhage.  Edema with mass-effect 7 mm right side midline shift. Echocardiogram with ejection fraction of 60-65%, no wall motion abnormalities.  EEG negative for seizure.  She required intubation for extended period of time underwent tracheostomy on 03/13/2020 per Dr. Levon Hedger.  She was downsized to #4 cuffless trach 03/28/2020.  She is currently NPO with alternative means of nutritional support.  Maintain on Plavix for CVA prophylaxis.  Subcutaneous Lovenox for DVT prophylaxis.  Bouts of urinary retention, placed on low-dose Urecholine.  Therapy evaluations completed with recommendations of physical medicine rehab consult.  Patient was admitted for a comprehensive rehab program.  Please see preadmission assessment from earlier today as well.  Review of Systems  Unable to perform ROS: Medical condition   Past Medical History:  Diagnosis Date  . DM (diabetes mellitus), type 2 (HCC)   . Hyperlipidemia   . Hypersomnia   . Sprain of right ankle, initial encounter   . Syncope    Past Surgical History:  Procedure Laterality Date  . IR CT HEAD LTD  03/08/2020  . IR PERCUTANEOUS ART THROMBECTOMY/INFUSION INTRACRANIAL INC DIAG ANGIO  03/08/2020  . RADIOLOGY WITH ANESTHESIA N/A 03/08/2020   Procedure: IR WITH ANESTHESIA;  Surgeon: Julieanne Cotton, MD;  Location: [facility] OR;   Service: Radiology;  Laterality: N/A;   Family History  Problem Relation Age of Onset  . Heart defect Mother        Leaky Valve  . Heart Problems Father        Enlarged heart   . Heart defect Maternal Grandmother        Leaky Valve   . Stroke Paternal Grandfather    Social History:  reports that she has been smoking cigarettes. She has been smoking about 0.50 packs per day. She has never used smokeless tobacco. She reports previous alcohol use. She reports previous drug use. Allergies:  Allergies  Allergen Reactions  . Aspirin Shortness Of Breath  . Mushroom Extract Complex Swelling   Medications Prior to Admission  Medication Sig Dispense Refill  . acetaminophen (TYLENOL) 325 MG tablet Take 325 mg by mouth every 6 (six) hours as needed.    Marland Kitchen albuterol (VENTOLIN HFA) 108 (90 Base) MCG/ACT inhaler Inhale 2 puffs into the lungs every 6 (six) hours as needed for wheezing or shortness of breath.     . ALPRAZolam (XANAX) 0.5 MG tablet Take 0.5 mg by mouth 3 (three) times daily as needed for anxiety. 1 tablet by mouth TID PRN    . atorvastatin (LIPITOR) 20 MG tablet Take 20 mg by mouth daily.    . Butalbital-APAP-Caffeine 50-325-40 MG capsule Take 1 capsule by mouth 2 (two) times daily as needed for headache.     . cyclobenzaprine (FLEXERIL) 5 MG tablet Take 5 mg by mouth 3 (three) times daily.     . diphenhydrAMINE (BENADRYL) 25 mg capsule Take 25 mg by mouth daily.    Marland Kitchen  glimepiride (AMARYL) 2 MG tablet Take 2 mg by mouth daily with breakfast.    . insulin glargine (LANTUS SOLOSTAR) 100 UNIT/ML Solostar Pen Inject 28 Units into the skin daily.     Marland Kitchen liraglutide (VICTOZA) 18 MG/3ML SOPN Inject 1.8 mg into the skin daily. 1.8 daily      . Phenyleph-Doxylamine-DM-APAP (NYQUIL SEVERE COLD/FLU) 5-6.25-10-325 MG/15ML LIQD Take 15 mLs by mouth every 4 (four) hours as needed (cough).    . promethazine (PHENERGAN) 25 MG tablet Take 25 mg by mouth 3 (three) times daily as needed for nausea  (dizziness).     . traMADol (ULTRAM) 50 MG tablet Take 50-100 mg by mouth every 8 (eight) hours as needed for moderate pain.     Marland Kitchen clopidogrel (PLAVIX) 75 MG tablet Take 1 tablet (75 mg total) by mouth daily. 90 tablet 2  . Erenumab-aooe 70 MG/ML SOAJ Inject into the skin. Once Monthly (Patient not taking: Reported on 03/08/2020)    . glucose blood (ACCU-CHEK AVIVA PLUS) test strip USE 1 STRIP UTD    . Insulin Pen Needle (NOVOFINE) 32G X 6 MM MISC USE BID    . Lancets (ACCU-CHEK MULTICLIX) lancets U TID    . levofloxacin (LEVAQUIN) 500 MG tablet Take by mouth. (Patient not taking: Reported on 03/08/2020)      Drug Regimen Review Drug regimen was reviewed and remains appropriate with no significant issues identified  Home: Home Living Family/patient expects to be discharged to:: Other (Comment) Living Arrangements: Spouse/significant other, Children Available Help at Discharge: Family, Available 24 hours/day Type of Home: Mobile home Home Access: Stairs to enter [employer] of Steps: ~7 Entrance Stairs-Rails: Right, Left Home Layout: One level Bathroom Shower/Tub: Health visitor: Standard Bathroom Accessibility: No Additional Comments: difficult to determine, pt intubated, no family present   Functional History: Prior Function Comments: Per palliative note, Pt was ambulatory and performing her own ADL  Functional Status:  Mobility: Bed Mobility Overal bed mobility: Needs Assistance Bed Mobility: Supine to Sit, Rolling Rolling: Max assist Sidelying to sit: Max assist Supine to sit: Max assist Sit to supine: Total assist General bed mobility comments: partial roll to left side then up to EOB position. Assist to advance R side and power up trunk.  Transfers Overall transfer level: Needs assistance Equipment used: Ambulation equipment used Transfer via Lift Equipment: Stedy Transfers: Sit to/from Stand Sit to Stand: Min assist, Mod assist, +2  safety/equipment Stand pivot transfers: +2 safety/equipment General transfer comment: 3x from elevated EOB height. assist to powerup, steady and control descent. 1x to RW, 1x to posterior aspect of bedside chair, and 1x to stedy. TCs applied to R quad with R knee block on occasion to extend knee, with no activation noted from pt. Ambulation/Gait Ambulation/Gait assistance:  (unable) General Gait Details: unable    ADL: ADL Overall ADL's : Needs assistance/impaired Grooming: Maximal assistance, Total assistance, Cueing for sequencing, Bed level Grooming Details (indicate cue type and reason): hand over hand for hand to face, able to grasp wash cloth, and perseverating on washing eyes, no attention to R side of face. Toileting- Clothing Manipulation and Hygiene: Moderate assistance, +2 for physical assistance, +2 for safety/equipment, Cueing for sequencing, Cueing for safety, Bed level Toileting - Clothing Manipulation Details (indicate cue type and reason): Rolling to complete peri-care Functional mobility during ADLs: Maximal assistance, +2 for physical assistance, +2 for safety/equipment General ADL Comments: sat EOB about 15 minutes at close min guard level. Assist to position R side into  supportive, weighbearing position.   Cognition: Cognition Overall Cognitive Status: Difficult to assess Arousal/Alertness: Awake/alert Orientation Level: Oriented to person Attention: Focused Focused Attention: Appears intact Cognition Arousal/Alertness: Awake/alert Behavior During Therapy: Impulsive Overall Cognitive Status: Difficult to assess Area of Impairment: Following commands, Problem solving, Attention, Safety/judgement Orientation Level:  (pt able to respond to her name ) Current Attention Level: Sustained Following Commands: Follows one step commands consistently, Follows one step commands with increased time Safety/Judgement: Decreased awareness of safety, Decreased awareness of  deficits Problem Solving: Slow processing, Decreased initiation, Difficulty sequencing, Requires verbal cues, Requires tactile cues General Comments: Pt emotional during session, believed to be from frustration at her current functional status. Did not appear to be from pain. Pt able to look to therapist when her name was stated. Required continual cues to make pt aware of R LE and UE, with min to no success as pt continued to move without acknowledging/assisting them. Pt impulsive and requires cues to maintain safety. Difficult to assess due to: Impaired communication, Tracheostomy  Physical Exam: Blood pressure 121/75, pulse 79, temperature 98.7 F (37.1 C), temperature source Oral, resp. rate (!) 22, height 5' (1.524 m), weight 90.6 kg, SpO2 94 %. Physical Exam Vitals reviewed.  Constitutional:      Appearance: She is obese.  HENT:     Head: Normocephalic and atraumatic.     Comments: + NG    Right Ear: External ear normal.     Left Ear: External ear normal.     Nose: Nose normal.  Eyes:     General:        Right eye: No discharge.        Left eye: No discharge.     Extraocular Movements: Extraocular movements intact.  Neck:     Comments: + Trach with PMV Cardiovascular:     Rate and Rhythm: Normal rate and regular rhythm.  Pulmonary:     Effort: Pulmonary effort is normal. No respiratory distress.     Breath sounds: No stridor. Wheezing present.  Abdominal:     General: Abdomen is flat. Bowel sounds are normal. There is no distension.  Musculoskeletal:     Comments: No edema or tenderness in extremities  Skin:    General: Skin is warm and dry.  Neurological:     Mental Status: She is alert.     Comments: Alert Global aphasia Makes eye contact with examiner  Motor: Limited by ability to follow commands: LUE/LE appear to be grossly intact with spontaneous movements noted No spontaneous movements noted RUE/RLE, however withdraws to painful stimuli. Limited by apraxia as  well  Psychiatric:     Comments: Unable to assess due to aphasia     Results for orders placed or performed during the hospital encounter of 03/08/20 (from the past 48 hour(s))  Glucose, capillary     Status: Abnormal   Collection Time: 03/28/20  4:01 PM  Result Value Ref Range   Glucose-Capillary 157 (H) 70 - 99 mg/dL    Comment: Glucose reference range applies only to samples taken after fasting for at least 8 hours.   Comment 1 Notify RN    Comment 2 Document in Chart   Glucose, capillary     Status: Abnormal   Collection Time: 03/28/20  8:26 PM  Result Value Ref Range   Glucose-Capillary 123 (H) 70 - 99 mg/dL    Comment: Glucose reference range applies only to samples taken after fasting for at least 8 hours.  Glucose, capillary  Status: Abnormal   Collection Time: 03/29/20 12:15 AM  Result Value Ref Range   Glucose-Capillary 108 (H) 70 - 99 mg/dL    Comment: Glucose reference range applies only to samples taken after fasting for at least 8 hours.  Glucose, capillary     Status: Abnormal   Collection Time: 03/29/20  3:41 AM  Result Value Ref Range   Glucose-Capillary 113 (H) 70 - 99 mg/dL    Comment: Glucose reference range applies only to samples taken after fasting for at least 8 hours.  Glucose, capillary     Status: Abnormal   Collection Time: 03/29/20  7:39 AM  Result Value Ref Range   Glucose-Capillary 130 (H) 70 - 99 mg/dL    Comment: Glucose reference range applies only to samples taken after fasting for at least 8 hours.  Glucose, capillary     Status: None   Collection Time: 03/29/20 11:33 AM  Result Value Ref Range   Glucose-Capillary 95 70 - 99 mg/dL    Comment: Glucose reference range applies only to samples taken after fasting for at least 8 hours.  Glucose, capillary     Status: Abnormal   Collection Time: 03/29/20  4:09 PM  Result Value Ref Range   Glucose-Capillary 118 (H) 70 - 99 mg/dL    Comment: Glucose reference range applies only to samples  taken after fasting for at least 8 hours.  Glucose, capillary     Status: Abnormal   Collection Time: 03/29/20  8:23 PM  Result Value Ref Range   Glucose-Capillary 104 (H) 70 - 99 mg/dL    Comment: Glucose reference range applies only to samples taken after fasting for at least 8 hours.  Glucose, capillary     Status: Abnormal   Collection Time: 03/29/20 11:47 PM  Result Value Ref Range   Glucose-Capillary 113 (H) 70 - 99 mg/dL    Comment: Glucose reference range applies only to samples taken after fasting for at least 8 hours.  CBC     Status: Abnormal   Collection Time: 03/30/20 12:59 AM  Result Value Ref Range   WBC 15.6 (H) 4.0 - 10.5 K/uL   RBC 3.43 (L) 3.87 - 5.11 MIL/uL   Hemoglobin 10.9 (L) 12.0 - 15.0 g/dL   HCT 21.1 (L) 36 - 46 %   MCV 99.1 80.0 - 100.0 fL   MCH 31.8 26.0 - 34.0 pg   MCHC 32.1 30.0 - 36.0 g/dL   RDW 94.1 74.0 - 81.4 %   Platelets 450 (H) 150 - 400 K/uL   nRBC 0.0 0.0 - 0.2 %    Comment: Performed at [facility], [address], [place], [place] [address]  Basic metabolic panel     Status: Abnormal   Collection Time: 03/30/20 12:59 AM  Result Value Ref Range   Sodium 137 135 - 145 mmol/L   Potassium 4.0 3.5 - 5.1 mmol/L   Chloride 104 98 - 111 mmol/L   CO2 24 22 - 32 mmol/L   Glucose, Bld 116 (H) 70 - 99 mg/dL    Comment: Glucose reference range applies only to samples taken after fasting for at least 8 hours.   BUN 21 (H) 6 - 20 mg/dL   Creatinine, Ser 6.31 0.44 - 1.00 mg/dL   Calcium 9.1 8.9 - 49.7 mg/dL   GFR, Estimated >02 >63 mL/min   Anion gap 9 5 - 15    Comment: Performed at [facility], [address]  [place], [place] [address]  Glucose, capillary     Status: Abnormal   Collection Time: 03/30/20  4:01 AM  Result Value Ref Range   Glucose-Capillary 113 (H) 70 - 99 mg/dL    Comment: Glucose reference range applies only to samples taken after fasting for at least 8 hours.  Glucose, capillary     Status: Abnormal   Collection  Time: 03/30/20  7:58 AM  Result Value Ref Range   Glucose-Capillary 124 (H) 70 - 99 mg/dL    Comment: Glucose reference range applies only to samples taken after fasting for at least 8 hours.  Glucose, capillary     Status: Abnormal   Collection Time: 03/30/20  8:28 AM  Result Value Ref Range   Glucose-Capillary 123 (H) 70 - 99 mg/dL    Comment: Glucose reference range applies only to samples taken after fasting for at least 8 hours.   Comment 1 Notify RN    Comment 2 Document in Chart   Glucose, capillary     Status: Abnormal   Collection Time: 03/30/20 11:34 AM  Result Value Ref Range   Glucose-Capillary 152 (H) 70 - 99 mg/dL    Comment: Glucose reference range applies only to samples taken after fasting for at least 8 hours.   Comment 1 Notify RN    Comment 2 Document in Chart    DG Chest Port 1 View  Result Date: 03/30/2020 CLINICAL DATA:  Leukocytosis EXAM: PORTABLE CHEST 1 VIEW COMPARISON:  03/19/2020 FINDINGS: Tracheostomy tube remains in place. Enteric tube courses below the diaphragm with distal tip beyond the inferior margin of the film. Stable heart size. Slightly low lung volumes. Minimal streaky bibasilar opacity. No pleural effusion or pneumothorax. IMPRESSION: Minimal streaky bibasilar opacity, atelectasis versus infiltrate. Electronically Signed   By: Duanne Guess D.O.   On: 03/30/2020 11:03    Medical Problem List and Plan: 1.  Right side hemiparesis with dysphagia secondary to left MCA infarct status post IR with postprocedural hemorrhage, infarct secondary to large vessel disease  -patient may not shower  -ELOS/Goals: Min/mod A/22-25 days  Admit to CIR 2.  Antithrombotics: -DVT/anticoagulation: Lovenox  -antiplatelet therapy: Plavix 75 mg daily 3. Pain Management: Tylenol as needed 4. Mood: Provide emotional support  -antipsychotic agents: N/A 5. Neuropsych: This patient is not capable of making decisions on her own behalf. 6. Skin/Wound Care: Routine skin  checks 7. Fluids/Electrolytes/Nutrition: Routine in and outs. 8.  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  Currently with a #4 cuffless trach as of 03/28/2020. Continue PMV as tolerated. Follow-up speech therapy 9.  Post-stroke Dysphagia.  NPO. Alternative means of nutritional support.  Follow-up speech therapy 10. Uncontrolled diabetes mellitus with hyperglycemia.  Hemoglobin A1c 10.7.  NovoLog 10 units every 4 hours, Lantus insulin 25 units twice daily. 11.  Urinary retention.  Urecholine 10 mg 3 times daily.    Check PVRs 12.  Hyperlipidemia. Lipitor 13.  Tobacco abuse. Counseled on appropriate 14. Leukocytosis  WBCs 15.6 on 10/13  Afebrile  Chest x-ray showing? Infiltrates, however more likely atelectasis-continue to monitor clinically  Charlton Amor, PA-C 03/30/2020  I have personally performed a face to face diagnostic evaluation, including, but not limited to relevant history and physical exam findings, of this patient and developed relevant assessment and plan.  Additionally, I have reviewed and concur with the physician assistant's documentation above.  Maryla Morrow, MD, ABPMR  The patient's status has not changed. Any changes from the pre-admission screening or documentation from the acute chart are noted above.  Delice Lesch, MD, ABPMR

## 2020-03-30 NOTE — Progress Notes (Signed)
Received patient to room in stable condition, assessment completed. Call light within reach. Patient aphasic with trach, unable to answer admission questions for completion.  Will be completed when family member present

## 2020-03-30 NOTE — Progress Notes (Signed)
  Speech Language Pathology Treatment: Hillary Bow Speaking valve  Patient Details Name: Deborah [name] MRN: [id number] DOB: May 16, 1971 Today's Date: 03/30/2020 Time: 9470-9628 SLP Time Calculation (min) (ACUTE ONLY): 27 min  Assessment / Plan / Recommendation Clinical Impression  Pt was seen for skilled ST targeting PMV toleration after trach downsizing. A small amount of secretions were suctioned prior to placing PMV. She tolerated placement of PMV for about 15 minutes and was able to redirect air through the upper airway without distress or changes in vital signs. Despite the ability to redirect airway to produce speech, she produced very little and became tearful when prompted to. Max verbal cues and modeling were utilized throughout the session to encourage initiation of verbal expression, however, majority were unsuccessful. When she did initiate verbal expression, her vocal quality was observed to be normal. Her intelligibility was slightly reduced and her speech contained groping vocalizations, mainly consisting of vowel sounds. She did intelligibly produce her name and single vowel sounds like /ah/ with a direct model. Despite pt's affect deterring initiation of verbal expression, she has made great progress with toleration of PMV.       HPI HPI: 49 y/o female with history of IDDM, HLD, tobacco use disorder presented to [facility] for facial droop, right sided weakness and global aphasia. Found to have LVO of left M1 segment and was transferred to [facility]. Pt underwent left common carotid arteriogram followed by revascularization of L MCA, however it reoccluded due to sever distal M1 stenosis.Pt underwent trach on 9/26.      SLP Plan  Continue with current plan of care       Recommendations  Diet recommendations: NPO      Patient may use Passy-Muir Speech Valve: with SLP only PMSV Supervision: Full         Oral Care Recommendations: Oral care QID Follow up Recommendations:  Inpatient Rehab SLP Visit Diagnosis: Aphonia (R49.1) Plan: Continue with current plan of care       GO                Royetta Crochet 03/30/2020, 12:59 PM

## 2020-03-30 NOTE — Progress Notes (Signed)
Marcello Fennel, MD  Physician  Physical Medicine and Rehabilitation  Consult Note      Addendum  Date of Service:  03/25/2020  5:28 AM      Related encounter: ED to Hosp-Admission (Discharged) from 03/08/2020 in [place] 3W Progressive [facility]      Expand All Collapse All  Show:Clear all [x] Manual[x] Template[] Copied  Added by: [x] Angiulli, , PA-C[x] , Mcarthur Rossetti, MD  [] Hover for details          Physical Medicine and Rehabilitation Consult Reason for Consult: Right side weakness and inability to speak Referring Physician: Dr.Xu     HPI: Deborah [name] is a 49 y.o. right-handed female with history of diabetes mellitus hyperlipidemia and tobacco use.  She presented on 03/08/2020 with right hemiparesis and dysarthria to outside hospital.  History taken from chart review and husband due to cognition/verbal output.  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 CTA confirming a left M2 cutoff.  She was brought to [facility] underwent thrombectomy per interventional radiology.  MRI showed large territory infarct with moderate hemorrhage and mild subarachnoid hemorrhage.  Edema with mass-effect with 7 mm right-sided midline shift.  Echocardiogram with ejection fraction of 60 to 65%, no wall motion abnormalities.  EEG negative for seizures.  Required intubation for extended period of time and underwent tracheostomy 03/13/2020 per Dr. 03/10/2020.  Tentatively planning to change to #4 cuffless on 03/28/2020.  Initially on aspirin and Plavix for CVA prophylaxis currently held with anticipation of need for PEG tube.  Subcutaneous Lovenox for DVT prophylaxis.  Hospital course further complicated by UTI, on IV Rocephin.  Na+ 134 on 10/8. Therapy evaluations completed with recommendations of physical medicine rehab consult.   Review of Systems  Unable to perform ROS: Acuity of condition        Past Medical History:  Diagnosis Date  . DM (diabetes mellitus), type 2 (HCC)    .  Hyperlipidemia    . Hypersomnia    . Sprain of right ankle, initial encounter    . Syncope           Past Surgical History:  Procedure Laterality Date  . IR CT HEAD LTD   03/08/2020  . IR PERCUTANEOUS ART THROMBECTOMY/INFUSION INTRACRANIAL INC DIAG ANGIO   03/08/2020  . RADIOLOGY WITH ANESTHESIA N/A 03/08/2020    Procedure: IR WITH ANESTHESIA;  Surgeon: 03/10/2020, MD;  Location: [facility] OR;  Service: Radiology;  Laterality: N/A;         Family History  Problem Relation Age of Onset  . Heart defect Mother          Leaky Valve  . Heart Problems Father          Enlarged heart   . Heart defect Maternal Grandmother          Leaky Valve   . Stroke Paternal Grandfather      Social History:  reports that she has been smoking cigarettes. She has been smoking about 0.50 packs per day. She has never used smokeless tobacco. She reports previous alcohol use. She reports previous drug use. Allergies:      Allergies  Allergen Reactions  . Aspirin Shortness Of Breath  . Mushroom Extract Complex Swelling          Medications Prior to Admission  Medication Sig Dispense Refill  . acetaminophen (TYLENOL) 325 MG tablet Take 325 mg by mouth every 6 (six) hours as needed.      03/10/2020 albuterol (VENTOLIN HFA)  108 (90 Base) MCG/ACT inhaler Inhale 2 puffs into the lungs every 6 (six) hours as needed for wheezing or shortness of breath.       . ALPRAZolam (XANAX) 0.5 MG tablet Take 0.5 mg by mouth 3 (three) times daily as needed for anxiety. 1 tablet by mouth TID PRN      . atorvastatin (LIPITOR) 20 MG tablet Take 20 mg by mouth daily.      . Butalbital-APAP-Caffeine 50-325-40 MG capsule Take 1 capsule by mouth 2 (two) times daily as needed for headache.       . cyclobenzaprine (FLEXERIL) 5 MG tablet Take 5 mg by mouth 3 (three) times daily.       . diphenhydrAMINE (BENADRYL) 25 mg capsule Take 25 mg by mouth daily.      Marland Kitchen. glimepiride (AMARYL) 2 MG tablet Take 2 mg by mouth daily with breakfast.      .  insulin glargine (LANTUS SOLOSTAR) 100 UNIT/ML Solostar Pen Inject 28 Units into the skin daily.       Marland Kitchen. liraglutide (VICTOZA) 18 MG/3ML SOPN Inject 1.8 mg into the skin daily. 1.8 daily         . Phenyleph-Doxylamine-DM-APAP (NYQUIL SEVERE COLD/FLU) 5-6.25-10-325 MG/15ML LIQD Take 15 mLs by mouth every 4 (four) hours as needed (cough).      . promethazine (PHENERGAN) 25 MG tablet Take 25 mg by mouth 3 (three) times daily as needed for nausea (dizziness).       . traMADol (ULTRAM) 50 MG tablet Take 50-100 mg by mouth every 8 (eight) hours as needed for moderate pain.       Marland Kitchen. clopidogrel (PLAVIX) 75 MG tablet Take 1 tablet (75 mg total) by mouth daily. 90 tablet 2  . Erenumab-aooe 70 MG/ML SOAJ Inject into the skin. Once Monthly (Patient not taking: Reported on 03/08/2020)      . glucose blood (ACCU-CHEK AVIVA PLUS) test strip USE 1 STRIP UTD      . Insulin Pen Needle (NOVOFINE) 32G X 6 MM MISC USE BID      . Lancets (ACCU-CHEK MULTICLIX) lancets U TID      . levofloxacin (LEVAQUIN) 500 MG tablet Take by mouth. (Patient not taking: Reported on 03/08/2020)          Home: Home Living Family/patient expects to be discharged to:: Other (Comment) Additional Comments: difficult to determine, pt intubated, no family present  Functional History: Prior Function Comments: Per palliative note, Pt was ambulatory and performing her own ADL Functional Status:  Mobility: Bed Mobility Overal bed mobility: Needs Assistance Bed Mobility: Supine to Sit, Rolling Rolling: +2 for physical assistance, Mod assist Sidelying to sit: Mod assist, +2 for physical assistance, +2 for safety/equipment, HOB elevated Supine to sit: HOB elevated, Mod assist Sit to supine: Total assist, +2 for physical assistance General bed mobility comments: Despite HOB elevated, requiring multi-modal cues to bring BLEs off the bed, able to reach out to grab therapist's hand to come to sitting, but requiring support at trunk x2 to come to  full upright position Transfers Overall transfer level: Needs assistance Equipment used: Ambulation equipment used Transfer via Lift Equipment: Stedy Transfers: Sit to/from Stand Sit to Stand: +2 physical assistance, +2 safety/equipment, Mod assist Stand pivot transfers: +2 safety/equipment General transfer comment: Increased need for VC's and assist on RLE and RUE in order to pull to stand, pt often attempting to stand pre-emptively and demonstrated decreased trunk control on R side, able to complete 4 sit<>stands, but fatigued quickly  Ambulation/Gait General Gait Details: unable   ADL: ADL Overall ADL's : Needs assistance/impaired Grooming: Maximal assistance, Total assistance, Cueing for sequencing, Bed level Grooming Details (indicate cue type and reason): hand over hand for hand to face, able to grasp wash cloth, and perseverating on washing eyes, no attention to R side of face. Toileting- Clothing Manipulation and Hygiene: Moderate assistance, +2 for physical assistance, +2 for safety/equipment, Cueing for sequencing, Cueing for safety, Bed level Toileting - Clothing Manipulation Details (indicate cue type and reason): Rolling to complete peri-care Functional mobility during ADLs: Maximal assistance, +2 for physical assistance, +2 for safety/equipment General ADL Comments: Pt continues to demonstrate a delay in processing, but adamantly showing therapists her composite flexion and extension with the LUE   Cognition: Cognition Overall Cognitive Status: Impaired/Different from baseline Arousal/Alertness: Awake/alert Orientation Level: Other (comment) Attention: Focused Focused Attention: Appears intact Cognition Arousal/Alertness: Awake/alert Behavior During Therapy: WFL for tasks assessed/performed Overall Cognitive Status: Impaired/Different from baseline Area of Impairment: Attention, Following commands, Safety/judgement, Awareness Orientation Level:  (pt able to respond to  her name ) Current Attention Level: Selective Following Commands: Follows one step commands inconsistently, Follows one step commands with increased time Safety/Judgement: Decreased awareness of safety, Decreased awareness of deficits Problem Solving: Slow processing, Decreased initiation, Difficulty sequencing, Requires verbal cues, Requires tactile cues General Comments: Decreased ability to follow one step commands to date, requiring multi-modal cues and reorienting to task Difficult to assess due to: Impaired communication, Tracheostomy   Blood pressure (!) 163/83, pulse 91, temperature 98.1 F (36.7 C), temperature source Oral, resp. rate 20, height 5' (1.524 m), weight 94.8 kg, SpO2 98 %. Physical Exam Vitals reviewed.  Constitutional:      Appearance: She is obese.     Comments: Patient able to do significantly more per husband, however withdrawn behaviors, regarding incontinent bowel movement.  HENT:     Head: Normocephalic and atraumatic.     Comments: + NG    Right Ear: External ear normal.     Left Ear: External ear normal.     Nose: Nose normal.  Eyes:     General:        Right eye: No discharge.        Left eye: No discharge.     Extraocular Movements: Extraocular movements intact.  Neck:     Comments: + Trach Cardiovascular:     Rate and Rhythm: Normal rate and regular rhythm.  Pulmonary:     Effort: Pulmonary effort is normal. No respiratory distress.     Breath sounds: No stridor.  Abdominal:     General: Abdomen is flat. There is no distension.  Musculoskeletal:     Comments: No edema or tenderness in extremities  Skin:    General: Skin is warm and dry.  Neurological:     Mental Status: She is alert.     Comments: Patient is alert makes eye contact with examiner.   Limited verbal output with tracheostomy tube in place.  Moving left side freely, no movement noted on right side  Psychiatric:     Comments: Unable to assess due to nonverbal        Lab  Results Last 24 Hours       Results for orders placed or performed during the hospital encounter of 03/08/20 (from the past 24 hour(s))  Glucose, capillary     Status: Abnormal    Collection Time: 03/24/20  3:52 PM  Result Value Ref Range    Glucose-Capillary 173 (H)  70 - 99 mg/dL  Glucose, capillary     Status: Abnormal    Collection Time: 03/24/20  8:00 PM  Result Value Ref Range    Glucose-Capillary 130 (H) 70 - 99 mg/dL  Glucose, capillary     Status: Abnormal    Collection Time: 03/25/20  1:02 AM  Result Value Ref Range    Glucose-Capillary 162 (H) 70 - 99 mg/dL  Glucose, capillary     Status: Abnormal    Collection Time: 03/25/20  4:22 AM  Result Value Ref Range    Glucose-Capillary 105 (H) 70 - 99 mg/dL  CBC     Status: Abnormal    Collection Time: 03/25/20  4:47 AM  Result Value Ref Range    WBC 14.2 (H) 4.0 - 10.5 K/uL    RBC 3.31 (L) 3.87 - 5.11 MIL/uL    Hemoglobin 10.5 (L) 12.0 - 15.0 g/dL    HCT 76.2 (L) 36 - 46 %    MCV 97.9 80.0 - 100.0 fL    MCH 31.7 26.0 - 34.0 pg    MCHC 32.4 30.0 - 36.0 g/dL    RDW 83.1 51.7 - 61.6 %    Platelets 483 (H) 150 - 400 K/uL    nRBC 0.0 0.0 - 0.2 %  Basic metabolic panel     Status: Abnormal    Collection Time: 03/25/20  4:47 AM  Result Value Ref Range    Sodium 134 (L) 135 - 145 mmol/L    Potassium 4.2 3.5 - 5.1 mmol/L    Chloride 96 (L) 98 - 111 mmol/L    CO2 26 22 - 32 mmol/L    Glucose, Bld 95 70 - 99 mg/dL    BUN 26 (H) 6 - 20 mg/dL    Creatinine, Ser 0.73 0.44 - 1.00 mg/dL    Calcium 9.2 8.9 - 71.0 mg/dL    GFR calc non Af Amer >60 >60 mL/min    Anion gap 12 5 - 15  Glucose, capillary     Status: Abnormal    Collection Time: 03/25/20  7:55 AM  Result Value Ref Range    Glucose-Capillary 167 (H) 70 - 99 mg/dL    Comment 1 Notify RN      Comment 2 Document in Chart    Glucose, capillary     Status: Abnormal    Collection Time: 03/25/20 11:48 AM  Result Value Ref Range    Glucose-Capillary 136 (H) 70 - 99 mg/dL       Imaging Results (Last 48 hours)  No results found.     Assessment/Plan: Diagnosis: Large left MCA territory infarct with moderate hemorrhage and mild subarachnoid hemorrhage   Stroke: Continue secondary stroke prophylaxis and Risk Factor Modification listed below:   Blood Pressure Management:  Continue current medication with prn's with permisive HTN per primary team Statin Agent:   Diabetes management:   Tobacco abuse:   ?Right sided hemiparesis: fit for orthosis to prevent contractures (resting hand splint for day, wrist cock up splint at night, PRAFO, etc) Motor recovery: Fluoxetine Labs independently reviewed.  Records reviewed and summated above.   1. Does the need for close, 24 hr/day medical supervision in concert with the patient's rehab needs make it unreasonable for this patient to be served in a less intensive setting? Potentially  2. Co-Morbidities requiring supervision/potential complications: DM (Monitor in accordance with exercise and adjust meds as necessary), hyperlipidemia, tobacco use (counsel and appropriate), morbid obesity (encourage weight loss), post-stroke dysphagia. 3. Due  to bladder management, bowel management, safety, skin/wound care, disease management, medication administration and patient education, does the patient require 24 hr/day rehab nursing? Yes 4. Does the patient require coordinated care of a physician, rehab nurse, therapy disciplines of PT/OT/SLP to address physical and functional deficits in the context of the above medical diagnosis(es)? Yes Addressing deficits in the following areas: balance, endurance, locomotion, strength, transferring, bowel/bladder control, bathing, dressing, toileting, cognition, speech, language, swallowing and psychosocial support 5. Can the patient actively participate in an intensive therapy program of at least 3 hrs of therapy per day at least 5 days per week? Yes 6. The potential for patient to make measurable gains  while on inpatient rehab is excellent 7. Anticipated functional outcomes upon discharge from inpatient rehab are supervision  with PT, supervision and min assist with OT, modified independent and supervision with SLP. 8. Estimated rehab length of stay to reach the above functional goals is: 16-19 days. 9. Anticipated discharge destination: Home 10. Overall Rehab/Functional Prognosis: good   RECOMMENDATIONS: This patient's condition is appropriate for continued rehabilitative care in the following setting: Potentially CIR.  Patient making good functional gains on acute floor.  Will consider admission if patient does not progress to supervision level of functioning after medically stable/completion of medical work-up.  Patient has agreed to participate in recommended program. Potentially Note that insurance prior authorization may be required for reimbursement for recommended care.   Comment: Rehab Admissions Coordinator to follow up.   I have personally performed a face to face diagnostic evaluation, including, but not limited to relevant history and physical exam findings, of this patient and developed relevant assessment and plan.  Additionally, I have reviewed and concur with the physician assistant's documentation above.    Maryla Morrow, MD, ABPMR Charlton Amor, PA-C 03/25/2020    Revision History                               Routing History                Note Details  Author Allena Katz, Maryln Gottron, MD File Time 03/25/2020  1:33 PM  Author Type Physician Status Addendum  Last Editor Marcello Fennel, MD Service Physical Medicine and Rehabilitation  [facility] Acct # [id number] Admit Date 03/30/2020

## 2020-03-30 NOTE — Progress Notes (Signed)
Inpatient Rehabilitation Admissions Coordinator  I have received insurance approval for Cir admit. I met at bedside with patient and her niece, Caryl Pina , at bedside. I spoke with son by phone and he is in agreement to admit.  I have alerted acute team and TOC. I will make the arrangements to admit today.  Danne Baxter, RN, MSN Rehab Admissions Coordinator [phone] 03/30/2020 12:01 PM

## 2020-03-30 NOTE — Progress Notes (Signed)
Physical Therapy Treatment Patient Details Name: Deborah [name] MRN: [id number] DOB: 04/21/1971 Today's Date: 03/30/2020    History of Present Illness 49 y/o female with history of IDDM, HLD, tobacco use disorder presented to [facility] for facial droop, right sided weakness and global aphasia. Found to have LVO of left M1 segment and was transferred to [facility]. Pt underwent left common carotid arteriogram followed by revascularization of L MCA, however it reoccluded due to sever distal M1 stenosis.Pt underwent trach on 9/26.    PT Comments    Pt appeared anxious with attempting lateral transfers on EOB this date as she would repeatedly reach L hand for L bed rail. Unable to transition efficiently to the R to facilitate R LE utilization, even with maxA and B knee block. Thus, focused on weight bearing through R LE by cuing pt to lean anteriorly onto her elbows placed on her knees and lean laterally to the R. Pt continues to display improved sitting balance. CIR continues to be an appropriate recommendation for the pt. Will continue to follow acutely.   Follow Up Recommendations  CIR;Supervision/Assistance - 24 hour     Equipment Recommendations  Wheelchair (measurements PT);Wheelchair cushion (measurements PT);Hospital bed;3in1 (PT);Other (comment) (lift equipment varies based on pt's progress)    Recommendations for Other Services Rehab consult     Precautions / Restrictions Precautions Precautions: Fall Precaution Comments: NG, R Hemi, +2, IV, trach collar Restrictions Weight Bearing Restrictions: No    Mobility  Bed Mobility Overal bed mobility: Needs Assistance Bed Mobility: Supine to Sit;Sit to Supine     Supine to sit: Mod assist;HOB elevated Sit to supine: Max assist   General bed mobility comments: HOB elevated with supine > sit, cuing pt to bring LEs to EOB, requiring assistance at R LE despite max cues to attempt to contract muscles. Cued pt on L hand placement  on L bed rail to ascend trunk. Sit > supine with maxA and cues to lean to L.  Transfers Overall transfer level: Needs assistance Equipment used: None Transfers: Lateral/Scoot Transfers          Lateral/Scoot Transfers: Max assist General transfer comment: Attempted lateral scooting to R to facilitate R LE activation, but pt appeared fearful as she would repeatedly attempt to grab L bed rail with L hand despite cues otherwise. B knee block and maxA to scoot minimally to R.  Ambulation/Gait                 Stairs             Wheelchair Mobility    Modified Rankin (Stroke Patients Only) Modified Rankin (Stroke Patients Only) Pre-Morbid Rankin Score: No symptoms Modified Rankin: Severe disability     Balance Overall balance assessment: Needs assistance Sitting-balance support: Feet supported;Bilateral upper extremity supported Sitting balance-Leahy Scale: Poor Sitting balance - Comments: Min guard to sit statically EOB with hand-over-hand direction to place R hand on bed surface. Pt impulsive and trying to grab L bed rail with L hand repeatedly. Intermittent minA when pt would become fatigued.                                    Cognition Arousal/Alertness: Awake/alert Behavior During Therapy: Impulsive Overall Cognitive Status: Difficult to assess Area of Impairment: Following commands;Safety/judgement;Problem solving  Following Commands: Follows one step commands inconsistently;Follows one step commands with increased time Safety/Judgement: Decreased awareness of safety;Decreased awareness of deficits   Problem Solving: Slow processing;Decreased initiation;Difficulty sequencing;Requires verbal cues;Requires tactile cues General Comments: Pt impulsive and required repeated cues to maintain safety and to remain attentive to task. Difficulty following directions this date.      Exercises      General Comments         Pertinent Vitals/Pain Pain Assessment: Faces Faces Pain Scale: Hurts a little bit Pain Location: generalized response to noxious stimuli Pain Descriptors / Indicators: Grimacing Pain Intervention(s): Monitored during session;Limited activity within patient's tolerance    Home Living                      Prior Function            PT Goals (current goals can now be found in the care plan section) Acute Rehab PT Goals Patient Stated Goal: unable to state PT Goal Formulation: Patient unable to participate in goal setting Time For Goal Achievement: 04/09/20 Potential to Achieve Goals: Fair Progress towards PT goals: Progressing toward goals    Frequency    Min 4X/week      PT Plan Current plan remains appropriate    Co-evaluation     PT goals addressed during session: Mobility/safety with mobility;Balance        AM-PAC PT "6 Clicks" Mobility   Outcome Measure  Help needed turning from your back to your side while in a flat bed without using bedrails?: A Lot Help needed moving from lying on your back to sitting on the side of a flat bed without using bedrails?: A Lot Help needed moving to and from a bed to a chair (including a wheelchair)?: A Lot Help needed standing up from a chair using your arms (e.g., wheelchair or bedside chair)?: A Little Help needed to walk in hospital room?: Total Help needed climbing 3-5 steps with a railing? : Total 6 Click Score: 11    End of Session Equipment Utilized During Treatment: Gait belt;Oxygen Activity Tolerance: Patient tolerated treatment well Patient left: in bed;with call bell/phone within reach;with bed alarm set Nurse Communication: Mobility status PT Visit Diagnosis: Unsteadiness on feet (R26.81);Other abnormalities of gait and mobility (R26.89);Muscle weakness (generalized) (M62.81);Difficulty in walking, not elsewhere classified (R26.2);Other symptoms and signs involving the nervous system  (R29.898);Hemiplegia and hemiparesis Hemiplegia - Right/Left: Right Hemiplegia - caused by: Cerebral infarction     Time: 3976-7341 PT Time Calculation (min) (ACUTE ONLY): 19 min  Charges:  $Therapeutic Activity: 8-22 mins                     Raymond Gurney, PT, DPT Acute Rehabilitation Services  Pager: [phone] Office: [phone]    Deborah [name] 03/30/2020, 2:51 PM

## 2020-03-30 NOTE — Plan of Care (Signed)

## 2020-03-30 NOTE — Discharge Summary (Addendum)
Stroke Discharge Summary  Patient ID: Deborah [name]   MRN: [id number]      DOB: 06-29-1970  Date of Admission: 03/08/2020 Date of Discharge: 03/30/2020  Attending Physician:  Garvin Fila, MD, Stroke MD Consultant(s):   Olene Craven) Estanislado Pandy, MD (Interventional Neuroradiologist), Modena Nunnery, DO (pulmonary/intensive care), Duffy Rhody, MD (neurosurgery), Kathie Rhodes, NP (palliative care), Delice Lesch, MD (Physical Medicine & Rehabtilitation)  Patient's PCP:  [name], MD  Discharge Diagnoses:  Principal Problem:   Cerebrovascular accident (CVA) due to thrombosis of middle cerebral artery [facility]) s/p attempted revascularization Active Problems:   Anxiety   Migraines   Dyslipidemia   Middle cerebral artery embolism, left   Palliative care by specialist   Goals of care, counseling/discussion   Status post tracheostomy ([name])   Dysphagia, post-stroke   Acute lower UTI   Tobacco abuse   Leukocytosis   Uncontrolled type 2 diabetes mellitus with hyperglycemia ([name])   Acute respiratory failure ([name])   Cerebral edema ([name])   Family hx-stroke   Medications to be continued on Rehab Allergies as of 03/30/2020       Reactions   Aspirin Shortness Of Breath   Mushroom Extract Complex Swelling        Medication List     STOP taking these medications    Accu-Chek Aviva Plus test strip Generic drug: glucose blood   accu-chek multiclix lancets   albuterol 108 (90 Base) MCG/ACT inhaler Commonly known as: VENTOLIN HFA   ALPRAZolam 0.5 MG tablet Commonly known as: XANAX   Butalbital-APAP-Caffeine 50-325-40 MG capsule   cyclobenzaprine 5 MG tablet Commonly known as: FLEXERIL   diphenhydrAMINE 25 mg capsule Commonly known as: BENADRYL   Erenumab-aooe 70 MG/ML Soaj   glimepiride 2 MG tablet Commonly known as: AMARYL   Lantus SoloStar 100 UNIT/ML Solostar Pen Generic drug: insulin glargine Replaced by: insulin glargine 100 UNIT/ML injection    levofloxacin 500 MG tablet Commonly known as: LEVAQUIN   liraglutide 18 MG/3ML Sopn Commonly known as: VICTOZA   NovoFine 32G X 6 MM Misc Generic drug: Insulin Pen Needle   NyQuil Severe Cold/Flu 5-6.25-10-325 MG/15ML Liqd Generic drug: Phenyleph-Doxylamine-DM-APAP   promethazine 25 MG tablet Commonly known as: PHENERGAN   traMADol 50 MG tablet Commonly known as: ULTRAM       TAKE these medications    acetaminophen 325 MG tablet Commonly known as: TYLENOL Take 2 tablets (650 mg total) by mouth every 4 (four) hours as needed for mild pain (or temp > 37.5 C (99.5 F)). What changed:  how much to take when to take this reasons to take this   atorvastatin 80 MG tablet Commonly known as: LIPITOR Place 1 tablet (80 mg total) into feeding tube daily. Start taking on: March 31, 2020 What changed:  medication strength how much to take how to take this   bethanechol 10 MG tablet Commonly known as: URECHOLINE Place 1 tablet (10 mg total) into feeding tube 3 (three) times daily.   chlorhexidine gluconate (MEDLINE KIT) 0.12 % solution Commonly known as: PERIDEX 15 mLs by Mouth Rinse route 2 (two) times daily.   clopidogrel 75 MG tablet Commonly known as: PLAVIX Place 1 tablet (75 mg total) into feeding tube daily. Start taking on: March 31, 2020 What changed: how to take this   dextrose 50 % solution Inject 0-50 mLs into the vein as needed for low blood sugar.   docusate 50 MG/5ML liquid Commonly known as: COLACE Place 10 mLs (  100 mg total) into feeding tube 2 (two) times daily.   enoxaparin 40 MG/0.4ML injection Commonly known as: LOVENOX Inject 0.4 mLs (40 mg total) into the skin daily.   feeding supplement (GLUCERNA 1.5 CAL) Liqd Place 1,000 mLs into feeding tube continuous.   feeding supplement (PROSource TF) liquid Place 45 mLs into feeding tube daily. Start taking on: March 31, 2020   insulin aspart 100 UNIT/ML injection Commonly known as:  novoLOG Inject 0-15 Units into the skin every 4 (four) hours.   insulin aspart 100 UNIT/ML injection Commonly known as: novoLOG Inject 10 Units into the skin every 4 (four) hours.   insulin glargine 100 UNIT/ML injection Commonly known as: LANTUS Inject 0.25 mLs (25 Units total) into the skin 2 (two) times daily. Replaces: Lantus SoloStar 100 UNIT/ML Solostar Pen   multivitamin with minerals Tabs tablet Place 1 tablet into feeding tube daily. Start taking on: March 31, 2020   polyethylene glycol 17 g packet Commonly known as: MIRALAX / GLYCOLAX Place 17 g into feeding tube daily. Start taking on: March 31, 2020   senna-docusate 8.6-50 MG tablet Commonly known as: Senokot-S Place 1 tablet into feeding tube at bedtime as needed for mild constipation or moderate constipation.   sodium chloride 0.9 % infusion Inject 50 mLs into the vein continuous.        LABORATORY STUDIES CBC    Component Value Date/Time   WBC 15.6 (H) 03/30/2020 0059   RBC 3.43 (L) 03/30/2020 0059   HGB 10.9 (L) 03/30/2020 0059   HCT 34.0 (L) 03/30/2020 0059   PLT 450 (H) 03/30/2020 0059   MCV 99.1 03/30/2020 0059   MCH 31.8 03/30/2020 0059   MCHC 32.1 03/30/2020 0059   RDW 14.3 03/30/2020 0059   LYMPHSABS 2.1 03/13/2020 0400   MONOABS 1.1 (H) 03/13/2020 0400   EOSABS 0.2 03/13/2020 0400   BASOSABS 0.1 03/13/2020 0400   CMP    Component Value Date/Time   NA 137 03/30/2020 0059   K 4.0 03/30/2020 0059   CL 104 03/30/2020 0059   CO2 24 03/30/2020 0059   GLUCOSE 116 (H) 03/30/2020 0059   BUN 21 (H) 03/30/2020 0059   CREATININE 0.77 03/30/2020 0059   CALCIUM 9.1 03/30/2020 0059   PROT 7.1 03/08/2020 1151   ALBUMIN 3.4 (L) 03/08/2020 1151   AST 24 03/08/2020 1151   ALT 30 03/08/2020 1151   ALKPHOS 169 (H) 03/08/2020 1151   BILITOT 0.4 03/08/2020 1151   GFRNONAA >60 03/30/2020 0059   GFRAA >60 03/22/2020 0123   COAGS Lab Results  Component Value Date   INR 1.1 03/08/2020   Lipid  Panel    Component Value Date/Time   CHOL 288 (H) 03/09/2020 0244   CHOL 201 (H) 12/10/2019 1500   TRIG 183 (H) 03/13/2020 0400   HDL 40 (L) 03/09/2020 0244   HDL 48 12/10/2019 1500   CHOLHDL 7.2 03/09/2020 0244   VLDL 74 (H) 03/09/2020 0244   LDLCALC 174 (H) 03/09/2020 0244   LDLCALC 116 (H) 12/10/2019 1500   HgbA1C  Lab Results  Component Value Date   HGBA1C 10.7 (H) 03/09/2020   Urinalysis    Component Value Date/Time   COLORURINE YELLOW 03/19/2020 2310   APPEARANCEUR HAZY (A) 03/19/2020 2310   LABSPEC 1.010 03/19/2020 2310   PHURINE 6.0 03/19/2020 2310   GLUCOSEU >=500 (A) 03/19/2020 2310   HGBUR NEGATIVE 03/19/2020 2310   BILIRUBINUR NEGATIVE 03/19/2020 2310   KETONESUR NEGATIVE 03/19/2020 2310   PROTEINUR NEGATIVE 03/19/2020  2310   NITRITE POSITIVE (A) 03/19/2020 2310   LEUKOCYTESUR SMALL (A) 03/19/2020 2310   Urine Drug Screen     Component Value Date/Time   LABOPIA NONE DETECTED 03/09/2020 0300   COCAINSCRNUR NONE DETECTED 03/09/2020 0300   LABBENZ POSITIVE (A) 03/09/2020 0300   AMPHETMU NONE DETECTED 03/09/2020 0300   THCU NONE DETECTED 03/09/2020 0300   LABBARB POSITIVE (A) 03/09/2020 0300    SIGNIFICANT DIAGNOSTIC STUDIES CT HEAD WO CONTRAST  Result Date: 03/13/2020 CLINICAL DATA:  Stroke follow-up EXAM: CT HEAD WITHOUT CONTRAST TECHNIQUE: Contiguous axial images were obtained from the base of the skull through the vertex without intravenous contrast. COMPARISON:  Two days ago FINDINGS: Brain: Early subacute left MCA territory infarct with swelling and small volume basal ganglia blood products. Midline shift measures 9 mm, unchanged. No entrapment or infarct progression. No new site of infarction seen. Vascular: No hyperdense vessel or unexpected calcification. Skull: Normal. Negative for fracture or focal lesion. Sinuses/Orbits: No acute finding. IMPRESSION: Stable extent of left MCA territory infarct with small volume blood products. Midline shift continues  to measure 9 mm. Electronically Signed   By: Monte Fantasia M.D.   On: 03/13/2020 07:56   CT HEAD WO CONTRAST  Result Date: 03/11/2020 CLINICAL DATA:  Stroke follow-up EXAM: CT HEAD WITHOUT CONTRAST TECHNIQUE: Contiguous axial images were obtained from the base of the skull through the vertex without intravenous contrast. COMPARISON:  Brain MRI from 2 days ago FINDINGS: Brain: Cytotoxic edema in the left MCA distribution affecting essentially the entire territory. There is progressive swelling with midline shift measuring up to 9 mm as compared to 6 mm previously. No entrapment or new infarct is seen. Petechial hemorrhage and small nodular foci of hemorrhage at the left basal ganglia have mildly regressed. Vascular: Pseudo hyperdense versus truly hyperdense left MCA branches. Skull: Negative Sinuses/Orbits: Unremarkable IMPRESSION: Acute left MCA territory infarct with mildly progressive swelling (9 mm midline shift) and regressed hemorrhage. Electronically Signed   By: Monte Fantasia M.D.   On: 03/11/2020 06:26   CT HEAD WO CONTRAST  Result Date: 03/09/2020 CLINICAL DATA:  Parenchymal hemorrhage, follow-up. EXAM: CT HEAD WITHOUT CONTRAST TECHNIQUE: Contiguous axial images were obtained from the base of the skull through the vertex without intravenous contrast. COMPARISON:  Prior head CT examinations 03/09/2020 at 12:58 a.m. and earlier. FINDINGS: Brain: Continued interval evolution of cytotoxic edema throughout much of the left MCA vascular territory consistent with acute/early subacute infarction, not significantly changed in extent as compared to the most recent prior head CT. Continued slight interval increase in mass effect with partial effacement of the left lateral ventricle and now with 6 mm rightward midline shift (previously 5 mm). The basal cisterns remain patent. No evidence of ventricular entrapment. Unchanged small foci of parenchymal hemorrhage within the left basal ganglia. Unchanged  appearance of additional scattered hemorrhage at the level of the left sylvian fissure and left frontal operculum. No interval hemorrhage is identified. No new large vessel territory infarct. Vascular: Redemonstrated hyperdensity within the M1 left MCA and proximal left M2 branches consistent with known reocclusion/thrombus. Skull: Normal. Negative for fracture or focal lesion. Sinuses/Orbits: Visualized orbits show no acute finding. Moderate ethmoid sinus mucosal thickening small amount of frothy secretions within the bilateral sphenoid sinuses. No significant mastoid effusion. IMPRESSION: Unchanged extent of a large acute/early subacute left MCA vascular territory infarct and associated hemorrhage as compared to the head CT performed earlier the same day at 12:57 a.m. Continued slight interval increase in mass effect  with partial effacement of the left lateral ventricle and now 6 mm rightward midline shift (previously 5 mm). The basal cisterns remain patent. No evidence of ventricular entrapment. Persistent hyperdensity within the M1 left middle cerebral artery and proximal left MCA branches consistent with known reocclusion/thrombus. Electronically Signed   By: Kellie Simmering DO   On: 03/09/2020 08:27   CT HEAD WO CONTRAST  Result Date: 03/09/2020 CLINICAL DATA:  Follow-up examination for parenchymal hemorrhage. EXAM: CT HEAD WITHOUT CONTRAST TECHNIQUE: Contiguous axial images were obtained from the base of the skull through the vertex without intravenous contrast. COMPARISON:  Prior CT from 03/08/2020. FINDINGS: Brain: Continued interval evolution of cytotoxic edema throughout much of the left MCA territory, overall similar in size and distribution from previous. Scattered foci of superimposed parenchymal hemorrhage at the left lentiform nucleus are relatively unchanged. Additional scattered hemorrhage at the level of the left sylvian fissure and operculum has somewhat dispersed since previous, now less  defined and less dense as compared to previous. No definite new hemorrhage. Regional mass effect with partial effacement of the left lateral ventricle has mildly worsened. Associated left-to-right midline shift now measures up to 5 mm. No other new large vessel territory infarct. No extra-axial fluid collection. Vascular: Persistent hyperdensity within the left M1 segment and proximal left MCA branches, consistent with reocclusion/thrombus. Skull: Scalp soft tissues within normal limits.  Calvarium intact. Sinuses/Orbits: Globes and orbital soft tissues demonstrate no acute finding. Scattered mucosal thickening noted within the ethmoidal air cells. Paranasal sinuses are otherwise clear. No mastoid effusion. Other: None. IMPRESSION: 1. Continued interval evolution of large left MCA territory infarct, overall similar in size and distribution from previous. Associated regional mass effect with partial effacement of the left lateral ventricle has mildly worsened, with worsened left-to-right midline shift now measuring up to 5 mm. 2. Scattered foci of superimposed parenchymal hemorrhage within the area of infarction, stable to slightly decreased from previous. A portion of the apparent improvement in this finding may reflect interval clearing of contrast staining. 3. Persistent hyperdensity within the left M1 segment and proximal left MCA branches, consistent with reocclusion/thrombus. 4. No other new acute intracranial abnormality. Electronically Signed   By: Jeannine Boga M.D.   On: 03/09/2020 01:51   CT HEAD WO CONTRAST  Result Date: 03/08/2020 CLINICAL DATA:  Stroke, follow-up. EXAM: CT HEAD WITHOUT CONTRAST TECHNIQUE: Contiguous axial images were obtained from the base of the skull through the vertex without intravenous contrast. COMPARISON:  Noncontrast head CT examine age performed earlier the same day 03/08/2020 at 7:57 a.m. and 10:26 a.m., CT angiogram head/neck 03/08/2020, neuro interventional  procedural note from earlier the same day. FINDINGS: Brain: As compared to the head CT performed earlier the same day at 7:57 a.m., there has been significant interval progression of acute ischemic infarction changes within the left MCA vascular territory affecting portions of the left frontal lobe, parietal lobe, temporal lobe and left insula. The infarct is now large. Small foci of parenchymal hemorrhage within the left basal ganglia do not appear significantly changed from the head CT performed earlier the same day at 10:26 a.m. However, slightly increased from this prior examination, there has been a slight interval increase in a small to moderate amount of hemorrhage and/or contrast within the left perisylvian region and more superiorly along the left cerebral convexity. There is increased mass effect with increased partial effacement of the left lateral ventricle and now 4 mm rightward midline shift. Vascular: Residual circulating contrast limits evaluation for abnormal vessel  hyperdensity Skull: Normal. Negative for fracture or focal lesion. Sinuses/Orbits: Visualized orbits show no acute finding. Mild ethmoid sinus mucosal thickening. Small right ethmoid sinus osteoma. No significant mastoid effusion. These results will be called to the ordering clinician or representative by the Radiologist Assistant, and communication documented in the PACS or [employer]. IMPRESSION: Significantly increased acute ischemic infarction changes within the left MCA vascular territory as compared to the head CT performed earlier the same day at 7:57 a.m. The left MCA infarct is now large. Increased mass effect and partial effacement of the left lateral ventricle now with 4 mm rightward midline shift. Small foci of parenchymal hemorrhage within the left basal ganglia do not appear significantly changed from the head CT performed earlier the same day at 10:26 a.m. However, small to moderate volume hemorrhage and/or contrast  within the left perisylvian region and more superiorly along the left cerebral convexity has slightly increased since this prior study. Electronically Signed   By: Kellie Simmering DO   On: 03/08/2020 18:18   CT HEAD WO CONTRAST  Result Date: 03/08/2020 CLINICAL DATA:  Referral for stroke treatment. EXAM: CT HEAD WITHOUT CONTRAST TECHNIQUE: Contiguous axial images were obtained from the base of the skull through the vertex without intravenous contrast. COMPARISON:  Head CT earlier today around the hospital FINDINGS: Brain: Cytotoxic edema seen in a matching distribution to prior, present at the putamen, insula, ganglionic cortex level, and supra ganglionic left frontal parietal cortex. No hemorrhagic conversion. There is still intravenous and dural contrast from preceding CTA. Vascular: Stable Skull: Negative Sinuses/Orbits: Negative Other: These results were called by telephone at the time of interpretation on 03/08/2020 at 8:49 am to provider [facility] , who verbally acknowledged these results. IMPRESSION: Acute left MCA territory infarct without hemorrhagic conversion or progression from CT earlier today. Aspects remains 6. Electronically Signed   By: Monte Fantasia M.D.   On: 03/08/2020 08:49   MR ANGIO HEAD WO CONTRAST  Result Date: 03/09/2020 CLINICAL DATA:  Stroke follow-up EXAM: MRI HEAD WITHOUT CONTRAST MRA HEAD WITHOUT CONTRAST TECHNIQUE: Multiplanar, multiecho pulse sequences of the brain and surrounding structures were obtained without intravenous contrast. Angiographic images of the head were obtained using MRA technique without contrast. COMPARISON:  CT head 03/09/2020 FINDINGS: MRI HEAD FINDINGS Brain: Large area of acute infarct left MCA territory appears similar in size to the CT earlier today. There is restricted diffusion throughout the left basal ganglia as well as in the left frontal and parietal lobe. The insula and superior temporal lobe is involved. There is moderate hemorrhage  within the infarct as well as some mild subarachnoid hemorrhage, similar to the prior CT. 7 mm midline shift to the right. No hydrocephalus. No evidence of chronic ischemic change. Vascular: Normal arterial flow voids. Skull and upper cervical spine: Negative Sinuses/Orbits: Mucosal edema paranasal sinuses.  Negative orbit Other: None MRA HEAD FINDINGS Both vertebral arteries widely patent. PICA patent bilaterally. Basilar widely patent. AICA, superior cerebellar, posterior cerebral arteries widely patent. Short segment signal loss anterior genu right internal carotid artery probably artifact. This vessel appears patent on CTA. Right middle cerebral artery patent. Anterior cerebral arteries widely patent Early branch of the left MCA is patent which appears to supply the temporal lobe. The left middle cerebral artery is occluded distal to this early branch. IMPRESSION: 1. Large territory infarct left MCA territory with moderate hemorrhage and mild subarachnoid hemorrhage. There is edema and mass-effect with 7 mm midline shift to the right. 2. Left MCA  is occluded in the M1 segment distal to an early temporal branch. No other intracranial stenosis. Electronically Signed   By: Franchot Gallo M.D.   On: 03/09/2020 18:50   MR BRAIN WO CONTRAST  Result Date: 03/09/2020 CLINICAL DATA:  Stroke follow-up EXAM: MRI HEAD WITHOUT CONTRAST MRA HEAD WITHOUT CONTRAST TECHNIQUE: Multiplanar, multiecho pulse sequences of the brain and surrounding structures were obtained without intravenous contrast. Angiographic images of the head were obtained using MRA technique without contrast. COMPARISON:  CT head 03/09/2020 FINDINGS: MRI HEAD FINDINGS Brain: Large area of acute infarct left MCA territory appears similar in size to the CT earlier today. There is restricted diffusion throughout the left basal ganglia as well as in the left frontal and parietal lobe. The insula and superior temporal lobe is involved. There is moderate  hemorrhage within the infarct as well as some mild subarachnoid hemorrhage, similar to the prior CT. 7 mm midline shift to the right. No hydrocephalus. No evidence of chronic ischemic change. Vascular: Normal arterial flow voids. Skull and upper cervical spine: Negative Sinuses/Orbits: Mucosal edema paranasal sinuses.  Negative orbit Other: None MRA HEAD FINDINGS Both vertebral arteries widely patent. PICA patent bilaterally. Basilar widely patent. AICA, superior cerebellar, posterior cerebral arteries widely patent. Short segment signal loss anterior genu right internal carotid artery probably artifact. This vessel appears patent on CTA. Right middle cerebral artery patent. Anterior cerebral arteries widely patent Early branch of the left MCA is patent which appears to supply the temporal lobe. The left middle cerebral artery is occluded distal to this early branch. IMPRESSION: 1. Large territory infarct left MCA territory with moderate hemorrhage and mild subarachnoid hemorrhage. There is edema and mass-effect with 7 mm midline shift to the right. 2. Left MCA is occluded in the M1 segment distal to an early temporal branch. No other intracranial stenosis. Electronically Signed   By: Franchot Gallo M.D.   On: 03/09/2020 18:50   IR CT Head Ltd  Result Date: 03/10/2020 INDICATION: Right-sided hemiplegia, left gaze preference and global aphasia. Occluded left middle cerebral artery M1 segment on CT angiogram of the head and neck. EXAM: 1. EMERGENT LARGE VESSEL OCCLUSION THROMBOLYSIS (anterior CIRCULATION) COMPARISON:  CT angiogram of the head and neck of September 21, from [place]: Ancef 2 g IV. The antibiotic was administered within 1 hour of the procedure. ANESTHESIA/SEDATION: General anesthesia CONTRAST:  Isovue 300 approximately 140 mL FLUOROSCOPY TIME:  Fluoroscopy Time: 48 minutes 40 seconds (989 mGy). COMPLICATIONS: None immediate. TECHNIQUE: Following a full explanation of the  procedure along with the potential associated complications, an informed witnessed consent was obtained. The risks of intracranial hemorrhage of 10%, worsening neurological deficit, ventilator dependency, death and inability to revascularize were all reviewed in detail with the patient's spouse. The patient was then put under general anesthesia by the Department of Anesthesiology at [facility]. The right groin was prepped and draped in the usual sterile fashion. Thereafter using modified Seldinger technique, transfemoral access into the right common femoral artery was obtained without difficulty. Over a 0.035 inch guidewire an 8 French 25 cm Pinnacle sheath was inserted. Through this, and also over a 0.035 inch guidewire a combination of an 087 balloon guide catheter inside of which was a 6 [place] select support catheter was advanced to the aortic arch region, and selectively advanced into distal left common carotid artery. The guidewire was removed. Good aspiration obtained from the hub of the balloon guide catheter following removal of the guidewire, and the select  support catheter. A control arteriogram was then performed just proximal to the left common carotid bifurcation centered extra cranially and intracranially. FINDINGS: The left common carotid arteriogram demonstrates the left external carotid artery and its major branches to be widely patent. The left internal carotid artery at the bulb to the cranial skull base is widely patent with a focal approximately 50% stenosis in the mid cervical segment. The petrous, the cavernous and the supraclinoid segments demonstrate wide patency. The left middle cerebral artery demonstrates complete occlusion in its proximal M1 segment. The left anterior cerebral artery opacifies into the capillary and venous phases with retrograde opacification of the left MCA distribution in distal parietal cortical and subcortical regions. PROCEDURE: Through the balloon guide  catheter in the proximal left internal carotid artery, a combination of a 021 Trevo ProVue microcatheter inside of a 071 Zoom 134 cm aspiration catheter was advanced over a 0.014 inch Synchro standard micro guidewire to the cervical left ICA. The micro guidewire was then gently advanced using a torque device through the occluded left middle cerebral artery into the M2 M3 region of the inferior division followed by the microcatheter. The guidewire was removed. Good aspiration obtained from the hub of the microcatheter which was then connected to continuous heparinized saline infusion. The microcatheter was retrieved and removed. Through the Zoom 071 aspiration catheter which is now positioned into the distal M1 segment a 35 Zoom aspiration catheter was advanced in a coaxial manner over a micro guidewire and positioned in the distal M2 M3 segment. Safe position of the tip of the 35 Zoom aspiration catheter was verified with good aspiration of blood at the hub. Thereafter with proximal flow arrest in the proximal left internal carotid artery, and aspiration at the hub of the 071 aspiration catheter with the Penumbra aspiration device for approximately 2-1/2 minutes after removal of the 035 aspiration catheter was performed. The aspiration catheter was then gently retrieved and removed with constant aspiration being applied with a 60 mL syringe at the hub of the balloon guide catheter in the left internal carotid artery. Flow arrest was then reversed. A control arteriogram performed through the balloon guide in the left internal carotid artery proximally demonstrated mild improvement in the recanalization of the left middle cerebral artery M1 segment. A second pass was then again made using the above combination with axis being obtained into the distal M2 M3 segment of the inferior division of the left middle cerebral artery. The micro guidewire was removed from the hub of the microcatheter. Free aspiration of blood was  noted at the hub. This was then connected to continuous infusion. A Tiger 17 retrieval device was then advanced to the distal end of the microcatheter. The O ring on the delivery microcatheter was loosened. The Tiger 17 was then deployed in the usual manner, with the proximal marker just proximal to the hub of the aspiration catheter which was now imbedded in the occluded left middle cerebral artery. Expansion of the Tiger retrieval device was then performed to approximately 2 mm where it was maintained for approximately 2 minutes. This was then reduced to approximately 1 mm using the massaging technique. The Tiger 17 was again expanded to approximately 2 mm. With constant aspiration of the hub of the aspiration catheter in the left middle cerebral artery, the retrieval device was retrieved into the 071 aspiration Zoom catheter and the combination was retrieved and removed. Proximal flow arrest was reversed. At this time chunks of clot were seen imbedded in the  interstices of the retrieval device. A control arteriogram performed through the balloon guide in the left internal carotid artery demonstrated modest improvement in the left MCA proximally, with opacification of anterior perisylvian branches achieving a TICI 2A revascularization. A third pass was then made using the above combination again using the 071 Zoom aspiration catheter advanced into occluded left middle cerebral artery distal M1 segment over a microcatheter and an 021 micro guidewire in the inferior division. Following ascertained safe position of the tip of the microcatheter as mentioned earlier, a 3 mm x 20 mm Solitaire X retrieval device was advanced and positioned with proximal aspect just proximal to the occluded left MCA M1 segment. With proximal flow arrest in the left internal carotid artery, and constant aspiration via an 071 Zoom aspiration catheter for approximately 2-1/2 minutes and a 60 mL syringe at the hub of the balloon guide  catheter in the left internal carotid artery, the combination of the retrieval device, the microcatheter, the 071 aspiration catheter, the combination was retrieved and removed. Following reversal of flow in the left internal carotid artery, a control arteriogram performed through this demonstrated near complete revascularization of the left middle cerebral artery territory achieving a TICI 2C revascularization. This, however, also revealed a high-grade stenosis of the distal left middle cerebral M1 segment secondary to atherosclerotic plaque. A CT of the brain was performed which demonstrated focal areas of hyper attenuation in the left basal ganglia region, and also the left perisylvian region consistent with mixture of cerebral artery hemorrhage, and contrast. No evidence of a mass effect or midline shift was seen. A control arteriogram performed through the balloon guide catheter in the left internal carotid artery demonstrated reocclusion of the left middle cerebral artery in the distal M1 segment. Further revascularization would entail placement of rescue stenting with the addition of least 2 oral antiplatelets, and also cangrelor infusion to prevent thrombosis within the stent. However, given the patient's initial presentation, the neuro imaging findings, the CT perfusion data, this option would place the patient at a significantly higher risk of intracranial hemorrhage. After further consultation with referring neurologist it was elected to stop at this juncture. The balloon guide was retrieved and removed. The 8 French Pinnacle sheath was replaced with an 8 French Angio-Seal closure device with hemostasis. Distal pulses remained Dopplerable in the right foot, and palpable in the left foot. Patient was left intubated in view of the patient's medical condition. She was then transferred to the neuro ICU for post revascularization care. IMPRESSION: Status post endovascular near complete revascularization of  occluded left middle cerebral M1 segment achieving a TICI 2C revascularization with 1 pass with the aspiration device, 1 pass with the Tiger 17 retrieval device with aspiration, and 1 pass with the Solitaire 3 mm x 20 mm retrieval device and aspiration as mentioned above. Reocclusion of the left middle cerebral artery secondary to underlying severe intracranial arteriosclerotic plaque in the distal left M1 segment distally. PLAN: Follow-up as per referring MD. Electronically Signed   By: Luanne Bras M.D.   On: 03/09/2020 09:34   CT Code Stroke Cerebral Perfusion with contrast  Result Date: 03/08/2020 CLINICAL DATA:  Stroke. EXAM: CT PERFUSION BRAIN TECHNIQUE: Multiphase CT imaging of the brain was performed following IV bolus contrast injection. Subsequent parametric perfusion maps were calculated using RAPID software. CONTRAST:  69m OMNIPAQUE IOHEXOL 350 MG/ML SOLN COMPARISON:  CT and CTA from earlier today. FINDINGS: CT Brain Perfusion Findings: CBF (<30%) Volume: 656mPerfusion (Tmax>6.0s) volume: 9542mismatch  Volume: 5m ASPECTS on noncontrast CT Head: 6 at 545 today. The left MCA territory infarct location has good general agreement with a noncontrast head CT, although is more extensive at the level of the lateral frontal lobe. IMPRESSION: 1. 63 cc core infarct in the left cerebral hemisphere with good general agreement between the CT perfusion and preceding noncontrast CT. 2. 32 cc of estimated penumbra elsewhere in the left MCA territory. Electronically Signed   By: JMonte FantasiaM.D.   On: 03/08/2020 08:26   DG Chest Port 1 View  Result Date: 03/30/2020 CLINICAL DATA:  Leukocytosis EXAM: PORTABLE CHEST 1 VIEW COMPARISON:  03/19/2020 FINDINGS: Tracheostomy tube remains in place. Enteric tube courses below the diaphragm with distal tip beyond the inferior margin of the film. Stable heart size. Slightly low lung volumes. Minimal streaky bibasilar opacity. No pleural effusion or  pneumothorax. IMPRESSION: Minimal streaky bibasilar opacity, atelectasis versus infiltrate. Electronically Signed   By: NDavina PokeD.O.   On: 03/30/2020 11:03   DG CHEST PORT 1 VIEW  Result Date: 03/19/2020 CLINICAL DATA:  Leukocytosis. EXAM: PORTABLE CHEST 1 VIEW COMPARISON:  March 13, 2020 FINDINGS: There is stable tracheostomy tube and nasogastric tube positioning. Mild bibasilar atelectasis is noted. There is no evidence of a pleural effusion or pneumothorax. The cardiac silhouette is mildly enlarged and unchanged in size. The visualized skeletal structures are unremarkable. IMPRESSION: Mild bibasilar atelectasis. Electronically Signed   By: TVirgina NorfolkM.D.   On: 03/19/2020 19:37   DG Chest Port 1 View  Result Date: 03/13/2020 CLINICAL DATA:  Tracheostomy tube placement. EXAM: PORTABLE CHEST 1 VIEW COMPARISON:  03/11/2020 FINDINGS: The endotracheal tube is been removed. The tracheostomy tube is in good position with its tip in the mid tracheal level. Stable feeding tube coursing down the esophagus and into the stomach. The left PICC line is stable. New small bilateral pleural effusions and new moderate right basilar atelectasis. No pulmonary edema or pneumothorax. IMPRESSION: 1. Tracheostomy tube in good position without complicating features. 2. New small bilateral pleural effusions and bibasilar atelectasis, right greater than left. Electronically Signed   By: PMarijo SanesM.D.   On: 03/13/2020 14:54   DG CHEST PORT 1 VIEW  Result Date: 03/11/2020 CLINICAL DATA:  Fever EXAM: PORTABLE CHEST 1 VIEW COMPARISON:  03/09/2020 FINDINGS: Heart size and pulmonary vascularity are normal for technique. Lungs are clear. No pleural effusions. No pneumothorax. There is an endotracheal tube with tip measuring 2.4 cm above the carina. An enteric tube is present with tip off the field of view. IMPRESSION: No evidence of active pulmonary disease. Appliances appear in satisfactory position.  Electronically Signed   By: WLucienne CapersM.D.   On: 03/11/2020 20:22   DG Chest Port 1 View  Result Date: 03/09/2020 CLINICAL DATA:  Hypoxia EXAM: PORTABLE CHEST 1 VIEW COMPARISON:  March 08, 2020 FINDINGS: Endotracheal tube tip is at the origin of the right main bronchus. Nasogastric tube tip and side port in stomach. No pneumothorax. There is atelectatic change in the left base. Lungs otherwise are clear. Heart is upper normal in size with pulmonary vascularity normal. No adenopathy. No bone lesions. IMPRESSION: Endotracheal tube tip in proximal right main bronchus. Advise withdrawing endotracheal tube approximately 3.5 cm. Atelectatic change left base. Lungs otherwise clear. No pneumothorax. Stable cardiac silhouette. Critical Value/emergent results were called by telephone at the time of interpretation on 03/09/2020 at 8:01 am to provider FFredirick Maudlin RN, who verbally acknowledged these results. Electronically Signed   By:  Lowella Grip III M.D.   On: 03/09/2020 08:01   DG Abd Portable 1V  Result Date: 03/09/2020 CLINICAL DATA:  Nasogastric tube placement EXAM: PORTABLE ABDOMEN - 1 VIEW COMPARISON:  None. FINDINGS: Nasogastric tube tip and side port are in the stomach. There is no bowel dilatation or air-fluid level to suggest bowel obstruction. No free air. Surgical clips noted in lower abdomen slightly to the left of midline. IMPRESSION: Nasogastric tube tip and side port in stomach. No bowel obstruction or free air evident on supine examination. Electronically Signed   By: Lowella Grip III M.D.   On: 03/09/2020 07:59   DG Swallowing Func-Speech Pathology  Result Date: 03/23/2020 Objective Swallowing Evaluation: Type of Study: MBS-Modified Barium Swallow Study  Patient Details Name: Deborah [name] MRN: [id number] Date of Birth: 1971/03/29 Today's Date: 03/23/2020 Time: SLP Start Time (ACUTE ONLY): 2025 -SLP Stop Time (ACUTE ONLY): 0933 SLP Time Calculation (min) (ACUTE ONLY): 15  min Past Medical History: Past Medical History: Diagnosis Date  DM (diabetes mellitus), type 2 ([place])   Hyperlipidemia   Hypersomnia   Sprain of right ankle, initial encounter   Syncope  Past Surgical History:  The histories are not reviewed yet. Please review them in the "History" navigator section and refresh this [place]. HPI: 49 y/o female with history of IDDM, HLD, tobacco use disorder presented to [facility] for facial droop, right sided weakness and global aphasia. Found to have LVO of left M1 segment and was transferred to [facility]. Pt underwent left common carotid arteriogram followed by revascularization of L MCA, however it reoccluded due to sever distal M1 stenosis.Pt underwent trach on 9/26.  Subjective: alert, not verbal Assessment / Plan / Recommendation CHL IP CLINICAL IMPRESSIONS 03/23/2020 Clinical Impression Pt presents with an oral more than pharyngeal dysphagia. She seems to have trouble with motor planning and coordination of swallowing throughout her oral preparation, beginning with bolus acceptance. SLP provided Mod cues for pt to open her mouth wide enough to accept a spoon. Despite cues, pt was not able to seal her lips around a cup or suck through a straw to get liquid from any other method. She loses a lot of the boluses from her mouth anteriorly, even with thicker consistencies like applesauce. She does reduced lingual coordination and strength, not able to achieve good bolus cohesion so that boluses spill prematurely into the pharynx, and also leaving diffuse residue behind that mixes with oral secretions. Premature spillage reaches the pyriform sinuses but pt triggers a swallow consistently and swiftly, often swallowing while she is still working on clearing her oral cavity. Sensed aspiration (PAS#7) occurred x1 before the swallow when premature spillage reached her larynx before swallow initiation occurred. Suction is needed to remove mild-moderate amounts of oral residue, after  losing more of the bolus anteriorly. Although pt did not have significant amounts of aspiration during this study, she also would not be able to functionally consume enough POs to sustain adequate nutrition or hydration. Would continue with alternate means of nutrition, but SLP will f/u for ongoing dysphagia therapy.  SLP Visit Diagnosis Dysphagia, oropharyngeal phase (R13.12) Attention and concentration deficit following -- Frontal lobe and executive function deficit following -- Impact on safety and function Moderate aspiration risk   CHL IP TREATMENT RECOMMENDATION 03/23/2020 Treatment Recommendations Therapy as outlined in treatment plan below   Prognosis 03/23/2020 Prognosis for Safe Diet Advancement Good Barriers to Reach Goals Cognitive deficits;Language deficits Barriers/Prognosis Comment -- CHL IP DIET RECOMMENDATION 03/23/2020 SLP Diet Recommendations NPO  Liquid Administration via -- Medication Administration Via alternative means Compensations -- Postural Changes --   CHL IP OTHER RECOMMENDATIONS 03/23/2020 Recommended Consults -- Oral Care Recommendations -- Other Recommendations Have oral suction available   CHL IP FOLLOW UP RECOMMENDATIONS 03/23/2020 Follow up Recommendations Skilled Nursing facility   [facility] IP FREQUENCY AND DURATION 03/23/2020 Speech Therapy Frequency (ACUTE ONLY) min 2x/week Treatment Duration 2 weeks      CHL IP ORAL PHASE 03/23/2020 Oral Phase Impaired Oral - Pudding Teaspoon -- Oral - Pudding Cup -- Oral - Honey Teaspoon -- Oral - Honey Cup -- Oral - Nectar Teaspoon -- Oral - Nectar Cup -- Oral - Nectar Straw -- Oral - Thin Teaspoon Right anterior bolus loss;Lingual pumping;Reduced posterior propulsion;Lingual/palatal residue;Right pocketing in lateral sulci;Left pocketing in lateral sulci;Pocketing in anterior sulcus;Delayed oral transit;Decreased bolus cohesion;Premature spillage Oral - Thin Cup -- Oral - Thin Straw -- Oral - Puree Lingual pumping;Reduced posterior  propulsion;Lingual/palatal residue;Right pocketing in lateral sulci;Left pocketing in lateral sulci;Pocketing in anterior sulcus;Delayed oral transit;Decreased bolus cohesion;Premature spillage Oral - Mech Soft -- Oral - Regular -- Oral - Multi-Consistency -- Oral - Pill -- Oral Phase - Comment --  CHL IP PHARYNGEAL PHASE 03/23/2020 Pharyngeal Phase Impaired Pharyngeal- Pudding Teaspoon -- Pharyngeal -- Pharyngeal- Pudding Cup -- Pharyngeal -- Pharyngeal- Honey Teaspoon -- Pharyngeal -- Pharyngeal- Honey Cup -- Pharyngeal -- Pharyngeal- Nectar Teaspoon -- Pharyngeal -- Pharyngeal- Nectar Cup -- Pharyngeal -- Pharyngeal- Nectar Straw -- Pharyngeal -- Pharyngeal- Thin Teaspoon Delayed swallow initiation-pyriform sinuses;Penetration/Aspiration before swallow Pharyngeal Material enters airway, passes BELOW cords and not ejected out despite cough attempt by patient Pharyngeal- Thin Cup -- Pharyngeal -- Pharyngeal- Thin Straw -- Pharyngeal -- Pharyngeal- Puree Delayed swallow initiation-pyriform sinuses Pharyngeal -- Pharyngeal- Mechanical Soft -- Pharyngeal -- Pharyngeal- Regular -- Pharyngeal -- Pharyngeal- Multi-consistency -- Pharyngeal -- Pharyngeal- Pill -- Pharyngeal -- Pharyngeal Comment --  CHL IP CERVICAL ESOPHAGEAL PHASE 03/23/2020 Cervical Esophageal Phase WFL Pudding Teaspoon -- Pudding Cup -- Honey Teaspoon -- Honey Cup -- Nectar Teaspoon -- Nectar Cup -- Nectar Straw -- Thin Teaspoon -- Thin Cup -- Thin Straw -- Puree -- Mechanical Soft -- Regular -- Multi-consistency -- Pill -- Cervical Esophageal Comment -- Osie Bond., M.A. [facility] Pager [phone] Office [phone] 03/23/2020, 10:08 AM              EEG adult  Result Date: 03/10/2020 Lora Havens, MD     03/10/2020  3:48 PM Patient Name: Deborah [name] MRN: [id number] Epilepsy Attending: Lora Havens Referring Physician/Provider: Dr Rosalin Hawking Date: 03/10/2020 Duration: 24.04 mins Patient history: 49yo F with   Left MCA infarct with R hemiparesis, facial droop, left gaze, and global aphasia. EEG to evaluate for seizure Level of alertness: comatose AEDs during EEG study: Propofol Technical aspects: This EEG study was done with scalp electrodes positioned according to the 10-20 International system of electrode placement. Electrical activity was acquired at a sampling rate of _0  and reviewed with a high frequency filter of _1  and a low frequency filter of _2 . EEG data were recorded continuously and digitally stored. Description: EEG showed continuous 3 to 6 Hz theta-delta slowing in left hemisphere as well as 8-_3  alpha activity in right hemisphere. Hyperventilation and photic stimulation were not performed.   ABNORMALITY -Continuous slow, lateralized left hemipshere IMPRESSION: This study is suggestive of cortical dysfunction in left hemisphere likely secondary to underlying infarct. No seizures or epileptiform discharges were seen throughout the recording. Deborah [name]   VAS [place]  GROIN PSEUDOANEURYSM  Result Date: 03/09/2020  ARTERIAL PSEUDOANEURYSM  Exam: Right groin Indications: Bleeding from access site, s/p cerebral arteriogram with mechanical thrombectomy via right femoral approach 03/08/2020. Performing Technologist: Maudry Mayhew MHA, RDMS, RVT, RDCS  Examination Guidelines: A complete evaluation includes B-mode imaging, spectral Doppler, color Doppler, and power Doppler as needed of all accessible portions of each vessel. Bilateral testing is considered an integral part of a complete examination. Limited examinations for reoccurring indications may be performed as noted. +------------+----------+---------+------+----------+ Right DuplexPSV (cm/s)Waveform PlaqueComment(s) +------------+----------+---------+------+----------+ Ext.Iliac      211    triphasic                 +------------+----------+---------+------+----------+ CFA            211    triphasic                  +------------+----------+---------+------+----------+ Prox SFA       -104   triphasic                 +------------+----------+---------+------+----------+ Right Vein comments: right common femoral vein is patent  Summary: No evidence of pseudoaneurysm, AVF or DVT  Diagnosing physician: Harold Barban MD Electronically signed by Harold Barban MD on 03/09/2020 at 9:35:09 PM.   --------------------------------------------------------------------------------    Final    ECHOCARDIOGRAM COMPLETE  Result Date: 03/09/2020    ECHOCARDIOGRAM REPORT   Patient Name:   Deborah [name] Date of Exam: 03/09/2020 Medical Rec #:  [id number]         Height:       60.0 in Accession #:    [id number]        Weight:       180.3 lb Date of Birth:  November 21, 1970          BSA:          1.786 m Patient Age:    60 years          BP:           149/75 mmHg Patient Gender: F                 HR:           94 bpm. Exam Location:  Outpatient Procedure: 2D Echo Indications:    Stroke I163.9  History:        Patient has prior history of Echocardiogram examinations, most                 recent 11/13/2019. Risk Factors:Dyslipidemia and Diabetes.  Sonographer:    Mikki Santee RDCS (AE) Referring Phys: [id number] [place]  1. Left ventricular ejection fraction, by estimation, is 60 to 65%. The left ventricle has normal function. The left ventricle has no regional wall motion abnormalities. Left ventricular diastolic parameters were normal.  2. Right ventricular systolic function is normal. The right ventricular size is normal. Tricuspid regurgitation signal is inadequate for assessing PA pressure.  3. The mitral valve is normal in structure. Trivial mitral valve regurgitation. No evidence of mitral stenosis.  4. The aortic valve is tricuspid. Aortic valve regurgitation is not visualized. No aortic stenosis is present.  5. The inferior vena cava is normal in size with greater than 50% respiratory variability, suggesting right  atrial pressure of 3 mmHg. Comparison(s): No significant change from prior study. Conclusion(s)/Recommendation(s): No intracardiac source of embolism detected on this transthoracic study. A transesophageal echocardiogram is recommended to exclude cardiac source of embolism if clinically indicated. FINDINGS  Left Ventricle:  Left ventricular ejection fraction, by estimation, is 60 to 65%. The left ventricle has normal function. The left ventricle has no regional wall motion abnormalities. The left ventricular internal cavity size was normal in size. There is  no left ventricular hypertrophy. Left ventricular diastolic parameters were normal. Right Ventricle: The right ventricular size is normal. No increase in right ventricular wall thickness. Right ventricular systolic function is normal. Tricuspid regurgitation signal is inadequate for assessing PA pressure. Left Atrium: Left atrial size was normal in size. Right Atrium: Right atrial size was normal in size. Pericardium: There is no evidence of pericardial effusion. Mitral Valve: The mitral valve is normal in structure. Trivial mitral valve regurgitation. No evidence of mitral valve stenosis. Tricuspid Valve: The tricuspid valve is normal in structure. Tricuspid valve regurgitation is trivial. No evidence of tricuspid stenosis. Aortic Valve: The aortic valve is tricuspid. Aortic valve regurgitation is not visualized. No aortic stenosis is present. Pulmonic Valve: The pulmonic valve was not well visualized. Pulmonic valve regurgitation is not visualized. No evidence of pulmonic stenosis. Aorta: The aortic root is normal in size and structure. Venous: The inferior vena cava is normal in size with greater than 50% respiratory variability, suggesting right atrial pressure of 3 mmHg. IAS/Shunts: No atrial level shunt detected by color flow Doppler.  LEFT VENTRICLE PLAX 2D LVIDd:         3.70 cm  Diastology LVIDs:         2.70 cm  LV e' medial:    9.36 cm/s LV PW:          1.00 cm  LV E/e' medial:  11.2 LV IVS:        0.90 cm  LV e' lateral:   11.40 cm/s LVOT diam:     1.70 cm  LV E/e' lateral: 9.2 LV SV:         42 LV SV Index:   24 LVOT Area:     2.27 cm  RIGHT VENTRICLE RV S prime:     15.30 cm/s TAPSE (M-mode): 2.0 cm LEFT ATRIUM             Index       RIGHT ATRIUM          Index LA diam:        2.70 cm 1.51 cm/m  RA Area:     9.00 cm LA Vol (A2C):   25.5 ml 14.28 ml/m RA Volume:   14.70 ml 8.23 ml/m LA Vol (A4C):   24.4 ml 13.66 ml/m LA Biplane Vol: 25.7 ml 14.39 ml/m  AORTIC VALVE LVOT Vmax:   83.30 cm/s LVOT Vmean:  64.800 cm/s LVOT VTI:    0.186 m MITRAL VALVE MV Area (PHT): 4.06 cm     SHUNTS MV Decel Time: 187 msec     Systemic VTI:  0.19 m MV E velocity: 105.00 cm/s  Systemic Diam: 1.70 cm MV A velocity: 107.00 cm/s MV E/A ratio:  0.98 Buford Dresser MD Electronically signed by Buford Dresser MD Signature Date/Time: 03/09/2020/7:14:28 PM    Final    IR PERCUTANEOUS ART THROMBECTOMY/INFUSION INTRACRANIAL INC DIAG ANGIO  Result Date: 03/10/2020 INDICATION: Right-sided hemiplegia, left gaze preference and global aphasia. Occluded left middle cerebral artery M1 segment on CT angiogram of the head and neck. EXAM: 1. EMERGENT LARGE VESSEL OCCLUSION THROMBOLYSIS (anterior CIRCULATION) COMPARISON:  CT angiogram of the head and neck of September 21, from [place]: Ancef 2 g IV. The antibiotic was administered within 1 hour of  the procedure. ANESTHESIA/SEDATION: General anesthesia CONTRAST:  Isovue 300 approximately 140 mL FLUOROSCOPY TIME:  Fluoroscopy Time: 48 minutes 40 seconds (989 mGy). COMPLICATIONS: None immediate. TECHNIQUE: Following a full explanation of the procedure along with the potential associated complications, an informed witnessed consent was obtained. The risks of intracranial hemorrhage of 10%, worsening neurological deficit, ventilator dependency, death and inability to revascularize were all reviewed in detail  with the patient's spouse. The patient was then put under general anesthesia by the Department of Anesthesiology at [facility]. The right groin was prepped and draped in the usual sterile fashion. Thereafter using modified Seldinger technique, transfemoral access into the right common femoral artery was obtained without difficulty. Over a 0.035 inch guidewire an 8 French 25 cm Pinnacle sheath was inserted. Through this, and also over a 0.035 inch guidewire a combination of an 087 balloon guide catheter inside of which was a 6 [place] select support catheter was advanced to the aortic arch region, and selectively advanced into distal left common carotid artery. The guidewire was removed. Good aspiration obtained from the hub of the balloon guide catheter following removal of the guidewire, and the select support catheter. A control arteriogram was then performed just proximal to the left common carotid bifurcation centered extra cranially and intracranially. FINDINGS: The left common carotid arteriogram demonstrates the left external carotid artery and its major branches to be widely patent. The left internal carotid artery at the bulb to the cranial skull base is widely patent with a focal approximately 50% stenosis in the mid cervical segment. The petrous, the cavernous and the supraclinoid segments demonstrate wide patency. The left middle cerebral artery demonstrates complete occlusion in its proximal M1 segment. The left anterior cerebral artery opacifies into the capillary and venous phases with retrograde opacification of the left MCA distribution in distal parietal cortical and subcortical regions. PROCEDURE: Through the balloon guide catheter in the proximal left internal carotid artery, a combination of a 021 Trevo ProVue microcatheter inside of a 071 Zoom 134 cm aspiration catheter was advanced over a 0.014 inch Synchro standard micro guidewire to the cervical left ICA. The micro guidewire was then  gently advanced using a torque device through the occluded left middle cerebral artery into the M2 M3 region of the inferior division followed by the microcatheter. The guidewire was removed. Good aspiration obtained from the hub of the microcatheter which was then connected to continuous heparinized saline infusion. The microcatheter was retrieved and removed. Through the Zoom 071 aspiration catheter which is now positioned into the distal M1 segment a 35 Zoom aspiration catheter was advanced in a coaxial manner over a micro guidewire and positioned in the distal M2 M3 segment. Safe position of the tip of the 35 Zoom aspiration catheter was verified with good aspiration of blood at the hub. Thereafter with proximal flow arrest in the proximal left internal carotid artery, and aspiration at the hub of the 071 aspiration catheter with the Penumbra aspiration device for approximately 2-1/2 minutes after removal of the 035 aspiration catheter was performed. The aspiration catheter was then gently retrieved and removed with constant aspiration being applied with a 60 mL syringe at the hub of the balloon guide catheter in the left internal carotid artery. Flow arrest was then reversed. A control arteriogram performed through the balloon guide in the left internal carotid artery proximally demonstrated mild improvement in the recanalization of the left middle cerebral artery M1 segment. A second pass was then again made using the above combination  with axis being obtained into the distal M2 M3 segment of the inferior division of the left middle cerebral artery. The micro guidewire was removed from the hub of the microcatheter. Free aspiration of blood was noted at the hub. This was then connected to continuous infusion. A Tiger 17 retrieval device was then advanced to the distal end of the microcatheter. The O ring on the delivery microcatheter was loosened. The Tiger 17 was then deployed in the usual manner, with the  proximal marker just proximal to the hub of the aspiration catheter which was now imbedded in the occluded left middle cerebral artery. Expansion of the Tiger retrieval device was then performed to approximately 2 mm where it was maintained for approximately 2 minutes. This was then reduced to approximately 1 mm using the massaging technique. The Tiger 17 was again expanded to approximately 2 mm. With constant aspiration of the hub of the aspiration catheter in the left middle cerebral artery, the retrieval device was retrieved into the 071 aspiration Zoom catheter and the combination was retrieved and removed. Proximal flow arrest was reversed. At this time chunks of clot were seen imbedded in the interstices of the retrieval device. A control arteriogram performed through the balloon guide in the left internal carotid artery demonstrated modest improvement in the left MCA proximally, with opacification of anterior perisylvian branches achieving a TICI 2A revascularization. A third pass was then made using the above combination again using the 071 Zoom aspiration catheter advanced into occluded left middle cerebral artery distal M1 segment over a microcatheter and an 021 micro guidewire in the inferior division. Following ascertained safe position of the tip of the microcatheter as mentioned earlier, a 3 mm x 20 mm Solitaire X retrieval device was advanced and positioned with proximal aspect just proximal to the occluded left MCA M1 segment. With proximal flow arrest in the left internal carotid artery, and constant aspiration via an 071 Zoom aspiration catheter for approximately 2-1/2 minutes and a 60 mL syringe at the hub of the balloon guide catheter in the left internal carotid artery, the combination of the retrieval device, the microcatheter, the 071 aspiration catheter, the combination was retrieved and removed. Following reversal of flow in the left internal carotid artery, a control arteriogram performed  through this demonstrated near complete revascularization of the left middle cerebral artery territory achieving a TICI 2C revascularization. This, however, also revealed a high-grade stenosis of the distal left middle cerebral M1 segment secondary to atherosclerotic plaque. A CT of the brain was performed which demonstrated focal areas of hyper attenuation in the left basal ganglia region, and also the left perisylvian region consistent with mixture of cerebral artery hemorrhage, and contrast. No evidence of a mass effect or midline shift was seen. A control arteriogram performed through the balloon guide catheter in the left internal carotid artery demonstrated reocclusion of the left middle cerebral artery in the distal M1 segment. Further revascularization would entail placement of rescue stenting with the addition of least 2 oral antiplatelets, and also cangrelor infusion to prevent thrombosis within the stent. However, given the patient's initial presentation, the neuro imaging findings, the CT perfusion data, this option would place the patient at a significantly higher risk of intracranial hemorrhage. After further consultation with referring neurologist it was elected to stop at this juncture. The balloon guide was retrieved and removed. The 8 French Pinnacle sheath was replaced with an 8 French Angio-Seal closure device with hemostasis. Distal pulses remained Dopplerable in the right foot,  and palpable in the left foot. Patient was left intubated in view of the patient's medical condition. She was then transferred to the neuro ICU for post revascularization care. IMPRESSION: Status post endovascular near complete revascularization of occluded left middle cerebral M1 segment achieving a TICI 2C revascularization with 1 pass with the aspiration device, 1 pass with the Tiger 17 retrieval device with aspiration, and 1 pass with the Solitaire 3 mm x 20 mm retrieval device and aspiration as mentioned above.  Reocclusion of the left middle cerebral artery secondary to underlying severe intracranial arteriosclerotic plaque in the distal left M1 segment distally. PLAN: Follow-up as per referring MD. Electronically Signed   By: Luanne Bras M.D.   On: 03/09/2020 09:34   [place] EKG SITE RITE  Result Date: 03/09/2020 If Site Rite image not attached, placement could not be confirmed due to current cardiac rhythm.      HISTORY OF PRESENT ILLNESS Deborah [name] is a 49 y.o. female past medical history significant for active smoking, diabetes, hyperlipidemia, chronic pain, migraines.    Per EMS report she was last known well at 8 PM on 03/07/2020 by family.  Family reported that they had called several times later that night and patient had not responded which was unusual for her.  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  She presented to [facility] and was found to have a left MCA stroke with an aspects of 6 on their head CT, with a CTA confirming a left M2 cutoff.  She was brought to [facility] for thrombectomy. Premorbid modified rankin scale: 0 - No symptoms.   Given her poor ASPECTS score, CT perfusion was obtained prior to thrombectomy to confirm that there was tissue at risk.  Given that there was some tissue at risk and her young age, decision was made to proceed with thrombectomy.  Unfortunately, though revascularization was initially achieved, there was rapid reocclusion.  Given the significant size of her hypodensity as well as subarachnoid hemorrhage during the procedure, decision was made not to proceed with stenting as the risks of further intracranial hemorrhage were felt to outweigh the benefit of reperfusing the relatively small amount of additional tissue at risk.   Notably she had been undergoing outpatient work-up for syncopal episodes of unclear etiology.  Family reports that they had completed the heart rate  monitoring and sent back the monitor but had not have been updated on results.  Her echocardiogram was minimally abnormal with grade 1 diastolic dysfunction     HOSPITAL COURSE [name] is a 49 y.o. female with history of difficulty to control DB, HLD, syncope presenting to [facility] with R hemiparesis, facial droop, left gaze, and global aphasia w/ glucose 400s where she was found to have L M1 occlusion and transferred to [facility] for IR.    Stroke:   L MCA infarct s/p IR w/ post procedure hemorrhage, infarct secondary to large vessel disease source CT head [facility]) L MCA infarct w/ ASPECTS 6 CTA head & neck [facility]) L M2 cutoff CT head L MCA territory infarct. ASPECTS 6.  CT perfusion 63 core L brain same as CT. 32 penumbra. 1.5 mismatch ratio. 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 Post IR hemorrhage. CT head 9/21 1818 increased L MCA ischemic infarct w/ mass effect and partial  effacement L lateral ventrilc w/ 44m midline shift. Small foci L BG HT same w/ increased L perisylvian and L cerebral convexity hemorrhage.  CT head 9/22 0151 evolution large L MCA infarct w/ slightly worse effacement and mass effect w/ 52mmidline shift. Scattered foci hemorrhage in infarct same w/ contrast clearing. L M1 and proximal L MCA branches w/ persistent hyperdensity.  CT head 9/22 0827  L MCA infarct and hemorrhages same. Increase in mass effect and effacement now 65m65midline shift. L M1 and proximal L MCA braches w/ persistent hyperdensity.  MRI 9/22 Large L MCA infarct w/ moderate infarct and SAH. Edema, 7mm665mdline shift.  MRA 9/22 L MCA 1 occlusion  CT head 9/24 L MCA infarct w/ mild increase in edema, 9mm 37mline shift   CT repeat 9/26 - Stable extent of left MCA territory infarct with small volume blood products. Midline shift continues to measure 9 mm. 2D Echo EF  60-65%. No source of embolus  Recent Zio patch Normal w/ infrequent PACs, PVCs 01/07/2020 May consider further cardioembolic work up as on OP if further neuro improvement LDL 174  HgbA1c 10.7  B12/TSH/RPR/ANA normal.  Hypercoagulable work up negative - Antithrombin III mildly elevated - 122, homocysteine mildly elevated - 22.1  VTE prophylaxis - Lovenox 40 mg sq daily    clopidogrel 75 mg daily prior to admission, now on plavix for stroke prevention (allergic to ASA).   Therapy recommendations:  CIR Disposition:  CIR   Acute Respiratory Failure Secondary to stroke Intubated for IR, left intubated post IR   Off sedation Trach placed 03/13/20 - on trach collar Downsized to size 4 cuffless trach Monday 10/11.  CCM on board for trach management     Cerebral Edema MRI 9/22 with MLS @ 7mm C15mead 9/24 L MCA infarct w/ mild increase in edema, 9mm mi54mne shift   CT repeat 9/26 - Stable extent of left MCA territory infarct with small volume blood products. Midline shift continues to measure 9 mm. On 3% protocol - _0 ->50->NS-> off 23.4% x 1 (9/25)   Leukocytosis WBC 13.4->15.6  (afebrile) UA - WBC 11-20 CXR 10/2 - bibasilar atx Urine culture insignificant growth Increased trach secretions - PRN suctioning Finished Rocephin 5 day course Repeat CXR w/ minimal streaky bibasilar opacities (atx vs infiltrate) and UA pending day of d/c w/ increasing WBC Get pt OOB in chair   Hyperlipidemia Home meds:  lipitor 20 LDL 174, goal < 70 On lipitor 80 Continue statin at discharge   Diabetes type II Uncontrolled Home meds:  Glimepiride , lantus 21, liraglutide 1.8 HgbA1c 10.7, goal < 7.0 CBGs Hyperglycemia much improved SSI 0-15 On lantus 25 bid novolog premeal 3U Q4 -> 5U Q4 -> 8U Q4->16U Q4->10U Q4 Changed TF formula from Jevity to Glucerna   Dysphagia Secondary to stroke NPO except meds on TF @ 45 via cortrak  On IVF @ 50 (mildly low BP) On FW flush 20cc Q4 MBS 10/6 - continue  NPO and meds via alternative means.  Speech on board   Tobacco abuse Current smoker Will provide smoking cessation when able    Other Stroke Risk Factors UDS positive benzos and barb (done following IR procedure)  Obesity, Body mass index is 39.01 kg/m., recommend weight loss, diet and exercise as appropriate  Family hx stroke (paternal grandfather) Migraines   Other Active Problems Chronic pain, Anxiety Hypokalemia, resolved - 4.0 Hyponatremia, Na 137 - resolved Anemia - Hgb - 10.1->11.1->10.9 Thrombocytosis PLT 450 - follow  DISCHARGE EXAM Blood pressure 121/75, pulse 79, temperature 98.7 F (37.1 C), temperature source Oral, resp. rate (!) 22, height 5' (1.524 m), weight 90.6 kg, SpO2 94 %. General - Well nourished, well developed, in no apparent distress, on trach collar.   Ophthalmologic - fundi not visualized due to noncooperation.   Cardiovascular - Regular rhythm and rate.   Neuro - awake alert, eyes open, on trach collar, nonverbal, able to track bilaterally, no gaze deviation but left gaze preference, not blinking to visual threat on the right.  Able to follow simple midline one-step commands not able to follow peripheral commands.  Right facial droop, tongue protrusion not corporative.  Left upper extremity at least 4/5.  Left lower extremity at least 3/5.  Right upper extremity flaccid.  Right lower extremity slight withdraw to pain.  Right Babinski positive. Sensation, coordination not corporative and gait not tested.   Discharge Diet  NPO, tube feedings via Cortrak  DISCHARGE PLAN Disposition:  Transfer to [place] for ongoing PT, OT and ST clopidogrel 75 mg daily for secondary stroke prevention (allergic to aspirin) Recommend ongoing stroke risk factor control by Primary Care Physician at time of discharge from inpatient rehabilitation. Follow-up PCP [name], MD in 2 weeks following discharge from rehab. Follow-up in [facility] in 4 weeks following discharge from rehab, office to schedule an appointment.   34 minutes were spent preparing discharge.  Burnetta Sabin, MSN, APRN, ANVP-BC, AGPCNP-BC Advanced Practice Stroke Nurse [place] for Schedule & Pager information 03/30/2020 1:16 PM  I have personally obtained history,examined this patient, reviewed notes, independently viewed imaging studies, participated in medical decision making and plan of care.ROS completed by me personally and pertinent positives fully documented  I have made any additions or clarifications directly to the above note. Agree with note above.   Antony Contras, MD Medical Director [place] Pager: [phone] 03/30/2020 2:01 PM

## 2020-03-31 ENCOUNTER — Inpatient Hospital Stay (HOSPITAL_COMMUNITY): Payer: Medicaid Other

## 2020-03-31 ENCOUNTER — Inpatient Hospital Stay (HOSPITAL_COMMUNITY): Payer: Medicaid Other | Admitting: Occupational Therapy

## 2020-03-31 ENCOUNTER — Other Ambulatory Visit: Payer: Self-pay

## 2020-03-31 ENCOUNTER — Inpatient Hospital Stay (HOSPITAL_COMMUNITY): Payer: Medicaid Other | Admitting: Speech Pathology

## 2020-03-31 DIAGNOSIS — I63312 Cerebral infarction due to thrombosis of left middle cerebral artery: Secondary | ICD-10-CM

## 2020-03-31 LAB — CBC WITH DIFFERENTIAL/PLATELET
Abs Immature Granulocytes: 0.14 10*3/uL — ABNORMAL HIGH (ref 0.00–0.07)
Basophils Absolute: 0.1 10*3/uL (ref 0.0–0.1)
Basophils Relative: 1 %
Eosinophils Absolute: 0.5 10*3/uL (ref 0.0–0.5)
Eosinophils Relative: 4 %
HCT: 34 % — ABNORMAL LOW (ref 36.0–46.0)
Hemoglobin: 11 g/dL — ABNORMAL LOW (ref 12.0–15.0)
Immature Granulocytes: 1 %
Lymphocytes Relative: 24 %
Lymphs Abs: 3.1 10*3/uL (ref 0.7–4.0)
MCH: 32.3 pg (ref 26.0–34.0)
MCHC: 32.4 g/dL (ref 30.0–36.0)
MCV: 99.7 fL (ref 80.0–100.0)
Monocytes Absolute: 1.1 10*3/uL — ABNORMAL HIGH (ref 0.1–1.0)
Monocytes Relative: 8 %
Neutro Abs: 8.1 10*3/uL — ABNORMAL HIGH (ref 1.7–7.7)
Neutrophils Relative %: 62 %
Platelets: 418 10*3/uL — ABNORMAL HIGH (ref 150–400)
RBC: 3.41 MIL/uL — ABNORMAL LOW (ref 3.87–5.11)
RDW: 14.3 % (ref 11.5–15.5)
WBC: 13 10*3/uL — ABNORMAL HIGH (ref 4.0–10.5)
nRBC: 0 % (ref 0.0–0.2)

## 2020-03-31 LAB — COMPREHENSIVE METABOLIC PANEL
ALT: 30 U/L (ref 0–44)
AST: 27 U/L (ref 15–41)
Albumin: 2.8 g/dL — ABNORMAL LOW (ref 3.5–5.0)
Alkaline Phosphatase: 96 U/L (ref 38–126)
Anion gap: 11 (ref 5–15)
BUN: 23 mg/dL — ABNORMAL HIGH (ref 6–20)
CO2: 24 mmol/L (ref 22–32)
Calcium: 9.3 mg/dL (ref 8.9–10.3)
Chloride: 103 mmol/L (ref 98–111)
Creatinine, Ser: 0.82 mg/dL (ref 0.44–1.00)
GFR, Estimated: >60 mL/min (ref >60)
Glucose, Bld: 139 mg/dL — ABNORMAL HIGH (ref 70–99)
Potassium: 4.4 mmol/L (ref 3.5–5.1)
Sodium: 138 mmol/L (ref 135–145)
Total Bilirubin: 0.6 mg/dL (ref 0.3–1.2)
Total Protein: 6.6 g/dL (ref 6.5–8.1)

## 2020-03-31 LAB — GLUCOSE, CAPILLARY
Glucose-Capillary: 101 mg/dL — ABNORMAL HIGH (ref 70–99)
Glucose-Capillary: 111 mg/dL — ABNORMAL HIGH (ref 70–99)
Glucose-Capillary: 115 mg/dL — ABNORMAL HIGH (ref 70–99)
Glucose-Capillary: 130 mg/dL — ABNORMAL HIGH (ref 70–99)
Glucose-Capillary: 142 mg/dL — ABNORMAL HIGH (ref 70–99)

## 2020-03-31 MED ORDER — GLUCERNA 1.5 CAL PO LIQD
1000.0000 mL | ORAL | Status: DC
  Administered 2020-03-31 – 2020-04-04 (×4): 1000 mL
  Filled 2020-03-31 (×8): qty 1000

## 2020-03-31 MED ORDER — FREE WATER
150.0000 mL | Status: DC
  Administered 2020-03-31 – 2020-04-02 (×11): 150 mL

## 2020-03-31 NOTE — Evaluation (Signed)
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 DM (diabetes mellitus), type 2 ([place])   . Hyperlipidemia   . Hypersomnia   . Sprain of right ankle, initial encounter   . Stroke ([place])   . Syncope    Past Surgical History:  Past Surgical History:  Procedure Laterality Date  . IR CT HEAD LTD  03/08/2020  . IR PERCUTANEOUS ART THROMBECTOMY/INFUSION INTRACRANIAL INC DIAG ANGIO  03/08/2020  . RADIOLOGY WITH ANESTHESIA N/A 03/08/2020   Procedure: IR WITH ANESTHESIA;  Surgeon: Luanne Bras, MD;  Location: [place];  Service: Radiology;  Laterality: N/A;    Assessment & Plan Clinical Impression: Patient is a 49 y.o. year old female with history of diabetes mellitus hyperlipidemia and tobacco abuse. History taken from chart review, niece, and therapies due to aphasia. She presented on 03/08/2020 with right side hemiparesis and aphasia to an outside hospital.  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  Urine drug screen positive benzos and barbiturates.  CTA confirming a left M2 cutoff.  She was brought to [facility] underwent thrombectomy per interventional radiology.  MRI showed large territory infarct with moderate hemorrhage and mild subarachnoid hemorrhage.  Edema with mass-effect 7 mm right side  midline shift. Echocardiogram with ejection fraction of 60-65%, no wall motion abnormalities.  EEG negative for seizure.  She required intubation for extended period of time underwent tracheostomy on 03/13/2020 per Dr. Ina Homes.  She was downsized to #4 cuffless trach 03/28/2020.  She is currently NPO with alternative means of nutritional support.  Maintain on Plavix for CVA prophylaxis.  Subcutaneous Lovenox for DVT prophylaxis.  Bouts of urinary retention, placed on low-dose Urecholine.  Therapy evaluations completed with recommendations of physical medicine rehab consult.  Patient was admitted for a comprehensive rehab program.  Patient currently requires total with mobility secondary to muscle weakness, decreased cardiorespiratoy endurance and decreased oxygen support, impaired timing and sequencing, unbalanced muscle activation and decreased motor planning, decreased midline orientation and decreased attention to right, decreased initiation, decreased attention, decreased awareness, decreased problem solving, decreased safety awareness and delayed processing and decreased sitting balance, decreased standing balance, decreased postural control, hemiplegia and decreased balance strategies.  Prior to hospitalization, patient was independent  with mobility and lived with   in a   home.  Home access is   .  Patient will benefit from skilled PT intervention to maximize safe functional mobility, minimize fall risk and decrease caregiver burden for planned discharge home with 24 hour assist.  Anticipate patient will benefit from follow up [facility] at discharge.      PT Evaluation Precautions/Restrictions Precautions Precautions: Fall Precaution Comments: NG, R Hemi, trach, aphasic Restrictions Weight Bearing Restrictions: No Home Living/Prior Functioning Home Living Living Arrangements: Spouse/significant other;Children (Son will be staying with patient and step father) Available Help at Discharge:  Available PRN/intermittently;Family Type of Home: Mobile home Home Access: Level entry (Per  notes: 7 STE, per patient: 3 STE, per mom: 0 STE) Home Layout: One level Bathroom Shower/Tub: Multimedia programmer: Standard Bathroom Accessibility: No Additional Comments: mother present partway through eval, all info above is per mother. Has boyfriend, ~30yo son and 68yo grandchild  Lives With: Significant other Prior Function Level of Independence: Independent with basic ADLs;Independent with transfers;Independent with homemaking with ambulation;Independent with homemaking with wheelchair;Independent with gait  Able to Take Stairs?: Yes Driving: Yes Vocation: Unemployed Vision/Perception  Vision - Assessment Additional Comments: R inattention noted Perception Perception: Impaired Inattention/Neglect: Impaired-to be further tested in functional context Praxis Praxis: Impaired Praxis Impairment Details: Initiation  Cognition Overall Cognitive Status: Difficult to assess Arousal/Alertness: Awake/alert Memory: Appears intact Awareness: Impaired Problem Solving: Impaired Safety/Judgment: Impaired Sensation Sensation Light Touch: Appears Intact Hot/Cold: Not tested Proprioception: Appears Intact Stereognosis: Appears Intact Coordination Gross Motor Movements are Fluid and Coordinated: No Fine Motor Movements are Fluid and Coordinated: No Finger Nose Finger Test: unable on R UE Heel Shin Test: unable on R LE Motor  Motor Motor: Hemiplegia;Abnormal postural alignment and control Motor - Skilled Clinical Observations: R hemiplegia, R lateral lean in sitting/standing   Trunk/Postural Assessment  Cervical Assessment Cervical Assessment: Exceptions to [facility] Thoracic Assessment Thoracic Assessment: Exceptions to [facility] Lumbar Assessment Lumbar Assessment: Exceptions to [facility] Postural Control Postural Control: Deficits on evaluation Trunk Control: R lateral lean in  sitting Righting Reactions: delayed and inadequate Protective Responses: delayed and inadequate  Balance Balance Balance Assessed: Yes Dynamic Sitting Balance Sitting balance - Comments: CGA sitting EOB with L UE support, intermittent MinA as patient was unable to correct R lateral lean without intervention Extremity Assessment      RLE Assessment RLE Assessment: Exceptions to [facility] RLE Strength Right Hip Flexion: 0/5 Right Hip ABduction: 0/5 Right Hip ADduction: 0/5 Right Knee Flexion: 1/5 Right Knee Extension: 2-/5 Right Ankle Dorsiflexion: 0/5 Right Ankle Plantar Flexion: 0/5 RLE Tone RLE Tone: Modified Ashworth Body Part - Modified Ashworth Scale: Hamstrings;Quadriceps Hamstrings - Modified Ashworth Scale for Grading Hypertonia RLE: Slight increase in muscle tone, manifested by a catch, followed by minimal resistance throughout the remainder (less than half) of the ROM QUADRICEPS - Modified Ashworth Scale for Grading Hypertonia RLE: Slight increase in muscle tone, manifested by a catch and release or by minimal resistance at the end of the range of motion when the affected part(s) is moved in flexion or extension LLE Assessment LLE Assessment: Within Functional Limits  Care Tool Care Tool Bed Mobility Roll left and right activity   Roll left and right assist level: Minimal Assistance - Patient > 75%    Sit to lying activity   Sit to lying assist level: Moderate Assistance - Patient 50 - 74%    Lying to sitting edge of bed activity   Lying to sitting edge of bed assist level: Moderate Assistance - Patient 50 - 74%     Care Tool Transfers Sit to stand transfer   Sit to stand assist level: Moderate Assistance - Patient 50 - 74%    Chair/bed transfer   Chair/bed transfer assist level: 2 Armed forces training and education officer transfer activity did not occur: Safety/medical concerns (due to patient fatigue/weakness)      Scientist, product/process development transfer activity did not occur:  Safety/medical concerns (due to patient fatigue/weakness)        Care Tool Locomotion Ambulation Ambulation activity did not occur: Safety/medical concerns (due to patient fatigue/weakness)        Walk 10  feet activity Walk 10 feet activity did not occur: Safety/medical concerns (due to patient fatigue/weakness)       Walk 50 feet with 2 turns activity Walk 50 feet with 2 turns activity did not occur: Safety/medical concerns (due to patient fatigue/weakness)      Walk 150 feet activity Walk 150 feet activity did not occur: Safety/medical concerns (due to patient fatigue/weakness)      Walk 10 feet on uneven surfaces activity Walk 10 feet on uneven surfaces activity did not occur: Safety/medical concerns (due to patient fatigue/weakness)      Stairs Stair activity did not occur: Safety/medical concerns (due to patient fatigue/weakness)        Walk up/down 1 step activity Walk up/down 1 step or curb (drop down) activity did not occur: Safety/medical concerns (due to patient fatigue/weakness)        Walk up/down 4 steps activity      Walk up/down 12 steps activity Walk up/down 12 steps activity did not occur: Safety/medical concerns (due to patient fatigue/weakness)      Pick up small objects from floor Pick up small object from the floor (from standing position) activity did not occur: Safety/medical concerns (due to patient fatigue/weakness)      Wheelchair Will patient use wheelchair at discharge?: Yes Type of Wheelchair: Manual   Wheelchair assist level: Dependent - Patient 0% (TIS wc) Max wheelchair distance: 150  Wheel 50 feet with 2 turns activity   Assist Level: Dependent - Patient 0%  Wheel 150 feet activity   Assist Level: Dependent - Patient 0%    Refer to Care Plan for Long Term Goals  SHORT TERM GOAL WEEK 1 PT Short Term Goal 1 (Week 1): Patient will maintain static sitting balance with no more than CGA for >3 mins PT Short Term Goal 2 (Week 1): Patient  will require no more than MaxA x1 for bed <> wc transfer PT Short Term Goal 3 (Week 1): Patient will initiate gait training as able  Recommendations for other services: Neuropsych and Therapeutic Recreation  Pet therapy and Stress management  Skilled Therapeutic Intervention Mobility Bed Mobility Bed Mobility: Rolling Right;Rolling Left;Sitting - Scoot to Edge of Bed;Supine to Sit;Sit to Supine Rolling Right: Minimal Assistance - Patient > 75% Rolling Left: Minimal Assistance - Patient > 75% Supine to Sit: Moderate Assistance - Patient 50-74% Sitting - Scoot to Edge of Bed: Moderate Assistance - Patient 50-74% Sit to Supine: Moderate Assistance - Patient 50-74% Transfers Transfers: Corporate treasurer Transfers: 2 Helpers Transfer (Assistive device): None Locomotion  Gait Ambulation: No (Patient fearful of attempting gait in // bars) Gait Gait: No Stairs / Additional Locomotion Stairs: No Wheelchair Mobility Wheelchair Mobility: Yes Wheelchair Assistance: Dependent - Patient 0% Wheelchair Parts Management: Needs assistance Distance: 150  Patient received supine in bed, agreeable to PT eval. At times, it appeared as though she was trying to communicate with hand gestures, then attempted answering yes/no questions using thumbs up/down, but PT is unsure of accuracy of answers. Patient becoming very tearful when mom entered room. Large episode of b/b incontinence requiring MaxA for pericare/hygiene, but patient was able to roll with MinA using bed rails. Patient fearful of attempting gait in // bars beginning to cry. She returned to room in wc, remaining up in TIS wc, seatbelt alarm on, call light within reach, mother at bedside.   Discharge Criteria: Patient will be discharged from PT if patient refuses treatment 3 consecutive times without medical reason, if treatment goals  not met, if there is a change in medical status, if patient makes no progress towards goals or if  patient is discharged from hospital.  The above assessment, treatment plan, treatment alternatives and goals were discussed and mutually agreed upon: by patient and by family  Debbora Dus 03/31/2020, 7:41 AM

## 2020-03-31 NOTE — Progress Notes (Signed)
Inpatient Rehabilitation  Patient information reviewed and entered into eRehab system by Nanda Bittick M. Heba Ige, M.A., CCC/SLP, PPS Coordinator.  Information including medical coding, functional ability and quality indicators will be reviewed and updated through discharge.    

## 2020-03-31 NOTE — Progress Notes (Signed)
[place] PHYSICAL MEDICINE & REHABILITATION PROGRESS NOTE   Subjective/Complaints:  Pt using thumbs up and down to communicate-  Is feeling OK- having A little pain.  Has TFs running 45cc/hour- on FiO2 of 28% per trach- sats 95%  Wasn't able to show where pain was- kept wiggling her hand back and forth, like "maybe" having pain, mild.    ROS: Unable to assess due to aphasia   Objective:   DG Chest Port 1 View  Result Date: 03/30/2020 CLINICAL DATA:  Leukocytosis EXAM: PORTABLE CHEST 1 VIEW COMPARISON:  03/19/2020 FINDINGS: Tracheostomy tube remains in place. Enteric tube courses below the diaphragm with distal tip beyond the inferior margin of the film. Stable heart size. Slightly low lung volumes. Minimal streaky bibasilar opacity. No pleural effusion or pneumothorax. IMPRESSION: Minimal streaky bibasilar opacity, atelectasis versus infiltrate. Electronically Signed   By: Duanne Guess D.O.   On: 03/30/2020 11:03   Recent Labs    03/30/20 0059 03/31/20 0544  WBC 15.6* 13.0*  HGB 10.9* 11.0*  HCT 34.0* 34.0*  PLT 450* 418*   Recent Labs    03/30/20 0059 03/31/20 0544  NA 137 138  K 4.0 4.4  CL 104 103  CO2 24 24  GLUCOSE 116* 139*  BUN 21* 23*  CREATININE 0.77 0.82  CALCIUM 9.1 9.3    Intake/Output Summary (Last 24 hours) at 03/31/2020 1007 Last data filed at 03/31/2020 0600 Gross per 24 hour  Intake 690 ml  Output --  Net 690 ml        Physical Exam: Vital Signs Blood pressure 110/68, pulse 73, temperature 98.5 F (36.9 C), temperature source Oral, resp. rate 18, weight 90.1 kg, SpO2 94 %.  Constitutional:  pt is awake, severely aphasic, appropriate/calm, NAD HENT:  Has Cortrak in place and trach in place- getting O2 via Trach 28% Cardiovascular: RRR- rate 73 per pulse ox.  Pulmonary: a little coarse throughout- but no W/R/R heard- good air movement Abdominal: Soft, NT, ND, (+)BS  Musculoskeletal:     Comments: No edema or tenderness in  extremities  Skin:    General: Skin is warm and dry.  Neurological:     Mental Status: She is alert.     Comments: Alert Global aphasia Makes eye contact with examiner  Motor: Intact in moving LUE- a few movements of LLE when asked to move RLE RUE has trace movement in biceps, triceps, WE and grip- RLE 0/5.   Psychiatric:     Comments: calm; placid  Assessment/Plan: 1. Functional deficits secondary to L MCA stroke with aphasia, and R heimplegia  which require 3+ hours per day of interdisciplinary therapy in a comprehensive inpatient rehab setting.  Physiatrist is providing close team supervision and 24 hour management of active medical problems listed below.  Physiatrist and rehab team continue to assess barriers to discharge/monitor patient progress toward functional and medical goals  Care Tool:  Bathing              Bathing assist       Upper Body Dressing/Undressing Upper body dressing        Upper body assist      Lower Body Dressing/Undressing Lower body dressing            Lower body assist       Toileting Toileting    Toileting assist       Transfers Chair/bed transfer  Transfers assist           Locomotion Ambulation  Ambulation assist              Walk 10 feet activity   Assist           Walk 50 feet activity   Assist           Walk 150 feet activity   Assist           Walk 10 feet on uneven surface  activity   Assist           Wheelchair     Assist               Wheelchair 50 feet with 2 turns activity    Assist            Wheelchair 150 feet activity     Assist          Blood pressure 110/68, pulse 73, temperature 98.5 F (36.9 C), temperature source Oral, resp. rate 18, weight 90.1 kg, SpO2 94 %.  Medical Problem List and Plan: 1.  Right side hemiparesis with dysphagia secondary to left MCA infarct status post IR with postprocedural hemorrhage, infarct  secondary to large vessel disease             -patient may not shower             -ELOS/Goals: Min/mod A/22-25 days             Admit to CIR 2.  Antithrombotics: -DVT/anticoagulation: Lovenox             -antiplatelet therapy: Plavix 75 mg daily 3. Pain Management: Tylenol as needed 4. Mood: Provide emotional support             -antipsychotic agents: N/A 5. Neuropsych: This patient is not capable of making decisions on her own behalf. 6. Skin/Wound Care: Routine skin checks 7. Fluids/Electrolytes/Nutrition: Routine in and outs. 8.  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  Currently with a #4 cuffless trach as of 03/28/2020. Continue PMV as tolerated. Follow-up speech therapy 9.  Post-stroke Dysphagia.  NPO. Alternative means of nutritional support.  Follow-up speech therapy  10/14- maintain TFs at regular rate- no boluses yet, until speak with SLP. BUN up to 23- will recheck Friday and if still going up, might need increase water boluses 10. Uncontrolled diabetes mellitus with hyperglycemia.  Hemoglobin A1c 10.7.  NovoLog 10 units every 4 hours, Lantus insulin 25 units twice daily.  10/14- BGs actually pretty well controlled- low 100s- con't regimen 11.  Urinary retention.  Urecholine 10 mg 3 times daily.               Check PVRs  10/14- ideally want Flomax but pt NPO- will con't Urecholine 12.  Hyperlipidemia. Lipitor 13.  Tobacco abuse. Counseled on appropriate 14. Leukocytosis             WBCs 15.6 on 10/13             Afebrile             Chest x-ray showing? Infiltrates, however more likely atelectasis-continue to monitor clinically  10/14- WBC down to 13- con't to monitor    LOS: 1 days A FACE TO FACE EVALUATION WAS PERFORMED  Deborah [name] 03/31/2020, 10:07 AM

## 2020-03-31 NOTE — Evaluation (Signed)
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 DM (diabetes mellitus), type 2 ([place])   . Hyperlipidemia   . Hypersomnia   . Sprain of right ankle, initial encounter   . Stroke ([place])   . Syncope    Past Surgical History:  Past Surgical History:  Procedure Laterality Date  . IR CT HEAD LTD  03/08/2020  . IR PERCUTANEOUS ART THROMBECTOMY/INFUSION INTRACRANIAL INC DIAG ANGIO  03/08/2020  . RADIOLOGY WITH ANESTHESIA N/A 03/08/2020   Procedure: IR WITH ANESTHESIA;  Surgeon: Luanne Bras, MD;  Location: [facility];  Service: Radiology;  Laterality: N/A;    Assessment / Plan / Recommendation Deborah [name] is a 49 year old right-handed female history of diabetes mellitus hyperlipidemia and tobacco abuse. History taken from chart review, niece, and therapies due to aphasia. She presented on 03/08/2020 with right side hemiparesis and aphasia to an outside hospital. 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 Urine drug screen positive benzos and barbiturates. CTA confirming a left M2 cutoff. She was brought to [facility] underwent thrombectomy per interventional radiology. MRI showed large territory infarct with moderate hemorrhage and mild subarachnoid hemorrhage. Edema with mass-effect 7 mm right side midline shift. Echocardiogram with ejection fraction of 60-65%, no wall motion abnormalities. EEG negative for  seizure. She required intubation for extended period of time underwent tracheostomy on 03/13/2020 per Dr. Ina Homes. She was downsized to #4 cuffless trach 03/28/2020. She is currently NPO with alternative means of nutritional support. Maintain on Plavix for CVA prophylaxis. Subcutaneous Lovenox for DVT prophylaxis. Bouts of urinary retention, placed on low-dose Urecholine. Therapy evaluations completed with recommendations of physical medicine rehab consult. Patient was admitted for a comprehensive rehab program. Please see preadmission assessment from earlier today as well. Patient transferred to CIR on 03/30/2020 .    Clinical Impression  Patient presents with severe mixed receptive-expressive aphasia and only able to verbalize her name but not repeat or name any other objects, etc. She currently is tolerating PMV with good voicing and SpO2 at 96% via trach collar without s/s of distress or increased RR or WOB. She is currently NPO and as per BSE completed during this evaluation, she exhibits mod-severe oral phase and suspect pharyngeal phase dysphagia as well, with anterior spillage of puree solids on right, oral holding and delayed initiation of swallow with throat clearing intermittently. MBS planned for next date. Patient was not able to repeat at phoneme or word level, inconsistent with pointing to identify objects in field of two (but did not show preference for left or right side.). She was able to follow basic verbal directions during oral motor assessment. Patient will benefit from skilled SLP intervention to maximize swallow function to initiate PO diet, expressive and receptive language skills to have basic needs/wants met, and PMV toleration.    Skilled Therapeutic Interventions          PMV eval, BSE eval, cognitive-linguistic speech evaluation  SLP Assessment  Patient will need skilled Speech Lanaguage Pathology Services during CIR admission  Recommendations  Patient may use  Passy-Muir Speech Valve: During all waking hours (remove during sleep) PMSV Supervision: Full SLP Diet Recommendations: NPO Medication Administration: Via alternative means Oral Care Recommendations: Oral care QID Recommendations for Other Services: Neuropsych consult Patient destination: [place] (SNF) Follow up Recommendations: Home Health SLP;Skilled Nursing facility;24 hour supervision/assistance Equipment Recommended: None recommended by SLP    SLP Frequency 3 to 5 out of 7 days   SLP Duration  SLP Intensity  SLP Treatment/Interventions 3-4 weeks  Minumum of 1-2 x/day, 30 to 90 minutes  Cognitive remediation/compensation;Dysphagia/aspiration precaution training;Environmental controls;Cueing hierarchy;Functional tasks;Speech/Language facilitation;Patient/family education;Multimodal communication approach    Pain Pain Assessment Pain Scale: Faces Pain Score: 0-No pain Faces Pain Scale: No hurt  Prior Functioning Cognitive/Linguistic Baseline: Baseline deficits Baseline deficit details: Mom stated that patient completed only middle school and has not worked Type of Home: Mobile home  Lives With: Significant other Available Help at Discharge: Available PRN/intermittently;Family Education: completed middle school per Mom Vocation: Unemployed  SLP Evaluation Cognition Overall Cognitive Status: Difficult to assess Arousal/Alertness: Awake/alert Orientation Level: Oriented to person Attention: Focused;Sustained Focused Attention: Appears intact Sustained Attention: Impaired Sustained Attention Impairment: Verbal basic;Functional basic Memory: Impaired Immediate Memory Recall:  (unable to fully and accurately assess d/t cognitive impairment and aphasia 2/2 CVA) Memory Recall Sock:  (unable to fully and accurately assess d/t cognitive impairment and aphasia 2/2 CVA) Memory Recall Blue:  (unable to fully and accurately assess d/t cognitive impairment and  aphasia 2/2 CVA) Memory Recall Bed:  (unable to fully and accurately assess d/t cognitive impairment and aphasia 2/2 CVA) Awareness: Impaired Awareness Impairment: Intellectual impairment Problem Solving: Impaired Problem Solving Impairment: Verbal basic;Functional basic Behaviors: Impulsive Safety/Judgment: Impaired  Comprehension Auditory Comprehension Overall Auditory Comprehension: Impaired Yes/No Questions: Impaired Basic Biographical Questions: 0-25% accurate Basic Immediate Environment Questions: 0-24% accurate Commands: Impaired One Step Basic Commands: 25-49% accurate Interfering Components: Attention;Processing speed EffectiveTechniques: Extra processing time;Repetition;Visual/Gestural cues Visual Recognition/Discrimination Discrimination: Not tested Common Objects: Unable to indentify Reading Comprehension Reading Status: Not tested Expression Expression Primary Mode of Expression: Verbal Verbal Expression Overall Verbal Expression: Impaired Initiation: Impaired Automatic Speech: Name Level of Generative/Spontaneous Verbalization: Word Repetition: Impaired Level of Impairment: Word level Naming: Impairment Common Objects: Unable to indentify Verbal Errors: Perseveration Pragmatics: Impairment Impairments: Abnormal affect Non-Verbal Means of Communication: Not applicable Oral Motor Oral Motor/Sensory Function Overall Oral Motor/Sensory Function: Moderate impairment Facial Symmetry: Abnormal symmetry right Facial Strength: Reduced right Lingual ROM: Reduced right Lingual Symmetry: Abnormal symmetry right Lingual Strength: Reduced Motor Speech Overall Motor Speech: Impaired Respiration: Within functional limits Phonation: Normal Resonance: Within functional limits Articulation: Impaired Level of Impairment: Word Intelligibility: Intelligibility reduced Word: 50-74% accurate Phrase: Not tested Sentence: Not tested Conversation: Not tested Motor  Planning: Impaired Level of Impairment: Word Motor Speech Errors: Unaware  Care Tool Care Tool Cognition Expression of Ideas and Wants Expression of Ideas and Wants: Rarely/Never expressess or very difficult - rarely/never expresses self or speech is very difficult to understand   Understanding Verbal and Non-Verbal Content Understanding Verbal and Non-Verbal Content: Sometimes understands - understands only basic conversations or simple, direct phrases. Frequently requires cues to understand   Memory/Recall Ability *first 3 days only Memory/Recall Ability *first 3 days only: Staff names and faces     PMSV Assessment  PMSV Trial PMSV was placed for: 40 minutes Able to redirect subglottic air through upper airway: Yes Able to Attain Phonation: Yes Voice Quality: Normal Able to Expectorate Secretions: No attempts Level of Secretion Expectoration  with PMSV: Tracheal Breath Support for Phonation: Adequate Intelligibility: Intelligibility reduced Word: 50-74% accurate Phrase: Not tested Sentence: Not tested Conversation: Not tested SpO2 During Trial: 96 % Behavior: Alert;Anxious;Confused  Bedside Swallowing Assessment General Date of Onset: 03/31/20 Previous Swallow Assessment: BSE Diet Prior to this Study: NPO Temperature Spikes Noted: No Respiratory Status: Trach Trach Size and Type: #4;Uncuffed;With PMSV in place History of Recent Intubation: Yes Length of Intubations (days): 5 days Date extubated:  (trachg 9/26) Behavior/Cognition: Alert;Requires cueing Oral Cavity - Dentition: Edentulous Self-Feeding Abilities: Total assist Patient Positioning: Upright in chair/Tumbleform Baseline Vocal Quality: Normal Volitional Cough: Cognitively unable to elicit Volitional Swallow: Unable to elicit  Oral Care Assessment   Ice Chips Ice chips: Impaired Presentation: Spoon Oral Phase Impairments: Reduced labial seal;Reduced lingual movement/coordination Pharyngeal Phase  Impairments: Throat Clearing - Delayed Thin Liquid Thin Liquid: Not tested Nectar Thick  NT Honey Thick  NT Puree Puree: Impaired (gelatin) Oral Phase Impairments: Reduced labial seal;Reduced lingual movement/coordination;Impaired mastication Oral Phase Functional Implications: Prolonged oral transit;Right anterior spillage;Oral holding Solid Solid: Not tested BSE Assessment Risk for Aspiration Impact on safety and function: Moderate aspiration risk Other Related Risk Factors: Tracheostomy;Cognitive impairment  Short Term Goals: Week 1: SLP Short Term Goal 1 (Week 1): Patient will tolerate trials of puree PO's with modA for oral clearance and bolus management at oral phase. SLP Short Term Goal 2 (Week 1): Patient will tolerate PMV during all waking hours with SpO2 at least at 95%. SLP Short Term Goal 3 (Week 1): Patient will identify common objects in field of two by pointing with 70% accuracy and modA SLP Short Term Goal 4 (Week 1): Patient will imitate at phoneme and CV (consonant-vowel) word level with 70% accuracy and mod-maximal cues SLP Short Term Goal 5 (Week 1): Patient will utilize basic level communication board to communicate immediate wants/needs with modA  Refer to Care Plan for Long Term Goals  Recommendations for other services: Neuropsych  Discharge Criteria: Patient will be discharged from SLP if patient refuses treatment 3 consecutive times without medical reason, if treatment goals not met, if there is a change in medical status, if patient makes no progress towards goals or if patient is discharged from hospital.  The above assessment, treatment plan, treatment alternatives and goals were discussed and mutually agreed upon: by family   Sonia Baller, MA, CCC-SLP Speech Therapy

## 2020-03-31 NOTE — Plan of Care (Signed)
Problem: RH Balance Goal: LTG: Patient will maintain dynamic sitting balance (OT) Description: LTG:  Patient will maintain dynamic sitting balance with assistance during activities of daily living (OT) Flowsheets (Taken 03/31/2020 1746) LTG: Pt will maintain dynamic sitting balance during ADLs with: Supervision/Verbal cueing Goal: LTG Patient will maintain dynamic standing with ADLs (OT) Description: LTG:  Patient will maintain dynamic standing balance with assist during activities of daily living (OT)  Flowsheets (Taken 03/31/2020 1746) LTG: Pt will maintain dynamic standing balance during ADLs with: Minimal Assistance - Patient > 75%   Problem: Sit to Stand Goal: LTG:  Patient will perform sit to stand in prep for activites of daily living with assistance level (OT) Description: LTG:  Patient will perform sit to stand in prep for activites of daily living with assistance level (OT) Flowsheets (Taken 03/31/2020 1746) LTG: PT will perform sit to stand in prep for activites of daily living with assistance level: Minimal Assistance - Patient > 75%   Problem: RH Grooming Goal: LTG Patient will perform grooming w/assist,cues/equip (OT) Description: LTG: Patient will perform grooming with assist, with/without cues using equipment (OT) Flowsheets (Taken 03/31/2020 1746) LTG: Pt will perform grooming with assistance level of: Supervision/Verbal cueing   Problem: RH Bathing Goal: LTG Patient will bathe all body parts with assist levels (OT) Description: LTG: Patient will bathe all body parts with assist levels (OT) Flowsheets (Taken 03/31/2020 1746) LTG: Pt will perform bathing with assistance level/cueing: Moderate Assistance - Patient 50 - 74%   Problem: RH Dressing Goal: LTG Patient will perform upper body dressing (OT) Description: LTG Patient will perform upper body dressing with assist, with/without cues (OT). Flowsheets (Taken 03/31/2020 1746) LTG: Pt will perform upper body dressing  with assistance level of: Minimal Assistance - Patient > 75% Goal: LTG Patient will perform lower body dressing w/assist (OT) Description: LTG: Patient will perform lower body dressing with assist, with/without cues in positioning using equipment (OT) Flowsheets (Taken 03/31/2020 1746) LTG: Pt will perform lower body dressing with assistance level of: Moderate Assistance - Patient 50 - 74%   Problem: RH Toileting Goal: LTG Patient will perform toileting task (3/3 steps) with assistance level (OT) Description: LTG: Patient will perform toileting task (3/3 steps) with assistance level (OT)  Flowsheets (Taken 03/31/2020 1746) LTG: Pt will perform toileting task (3/3 steps) with assistance level: Moderate Assistance - Patient 50 - 74%   Problem: RH Functional Use of Upper Extremity Goal: LTG Patient will use RT/LT upper extremity as a (OT) Description: LTG: Patient will use right/left upper extremity as a stabilizer/gross assist/diminished/nondominant/dominant level with assist, with/without cues during functional activity (OT) Flowsheets (Taken 03/31/2020 1746) LTG: Use of upper extremity in functional activities: RUE as a stabilizer LTG: Pt will use upper extremity in functional activity with assistance level of: Minimal Assistance - Patient > 75%   Problem: RH Toilet Transfers Goal: LTG Patient will perform toilet transfers w/assist (OT) Description: LTG: Patient will perform toilet transfers with assist, with/without cues using equipment (OT) Flowsheets (Taken 03/31/2020 1746) LTG: Pt will perform toilet transfers with assistance level of: Moderate Assistance - Patient 50 - 74%   Problem: RH Tub/Shower Transfers Goal: LTG Patient will perform tub/shower transfers w/assist (OT) Description: LTG: Patient will perform tub/shower transfers with assist, with/without cues using equipment (OT) Flowsheets (Taken 03/31/2020 1746) LTG: Pt will perform tub/shower stall transfers with assistance  level of: Moderate Assistance - Patient 50 - 74%   Problem: RH Attention Goal: LTG Patient will demonstrate this level of attention during  functional activites (OT) Description: LTG:  Patient will demonstrate this level of attention during functional activites  (OT) Flowsheets (Taken 03/31/2020 1746) Patient will demonstrate this level of attention during functional activites: Sustained Patient will demonstrate above attention level in the following environment: Controlled LTG: Patient will demonstrate this level of attention during functional activites (OT): Supervision   Problem: RH Awareness Goal: LTG: Patient will demonstrate awareness during functional activites type of (OT) Description: LTG: Patient will demonstrate awareness during functional activites type of (OT) Flowsheets (Taken 03/31/2020 1746) Patient will demonstrate awareness during functional activites type of: Emergent LTG: Patient will demonstrate awareness during functional activites type of (OT): Supervision

## 2020-03-31 NOTE — Progress Notes (Signed)
Inpatient Rehabilitation Center Individual Statement of Services  Patient Name:  Deborah [name]  Date:  03/31/2020  Welcome to the Inpatient Rehabilitation Center.  Our goal is to provide you with an individualized program based on your diagnosis and situation, designed to meet your specific needs.  With this comprehensive rehabilitation program, you will be expected to participate in at least 3 hours of rehabilitation therapies Monday-Friday, with modified therapy programming on the weekends.  Your rehabilitation program will include the following services:  Physical Therapy (PT), Occupational Therapy (OT), Speech Therapy (ST), 24 hour per day rehabilitation nursing, Therapeutic Recreaction (TR), Neuropsychology, Care Coordinator, Rehabilitation Medicine, Nutrition Services, Pharmacy Services and Other  Weekly team conferences will be held on Tuesday to discuss your progress.  Your Inpatient Rehabilitation Care Coordinator will talk with you frequently to get your input and to update you on team discussions.  Team conferences with you and your family in attendance may also be held.  Expected length of stay: 22-25 Days  Overall anticipated outcome: Min A  Depending on your progress and recovery, your program may change. Your Inpatient Rehabilitation Care Coordinator will coordinate services and will keep you informed of any changes. Your Inpatient Rehabilitation Care Coordinator's name and contact numbers are listed  below.  The following services may also be recommended but are not provided by the Inpatient Rehabilitation Center:    Home Health Rehabiltiation Services  Outpatient Rehabilitation Services    Arrangements will be made to provide these services after discharge if needed.  Arrangements include referral to agencies that provide these services.  Your insurance has been verified to be:  [place] Medicaid [employer]/ [place] Medicaid [employer] [place] Your primary doctor is:  Luna Kitchens,  MD  Pertinent information will be shared with your doctor and your insurance company.  Inpatient Rehabilitation Care Coordinator:  Lavera Guise, [place] [phone] or [phone]  Information discussed with and copy given to patient by: Andria Rhein, 03/31/2020, 11:34 AM

## 2020-03-31 NOTE — Progress Notes (Signed)
Initial Nutrition Assessment  DOCUMENTATION CODES:   Obesity unspecified  INTERVENTION:   - Recommend considering PEG tube placement for more permanent enteral access.  Tube feeding via Cortrak: - Increase Glucerna 1.5 to 65 ml/hr (tube feeds can be held for up to 4 hours for therapies) - Add free water flushes of 150 ml q 4 hours  Tube feeding regimen provides 1950 kcal, 107 grams of protein, and 987 ml of H2O.  Total free water with flushes: 1887 ml  NUTRITION DIAGNOSIS:   Inadequate oral intake related to dysphagia as evidenced by NPO status.  GOAL:   Patient will meet greater than or equal to 90% of their needs  MONITOR:   Diet advancement, Labs, Weight trends, TF tolerance, Skin, I & O's  REASON FOR ASSESSMENT:   Consult Enteral/tube feeding initiation and management  ASSESSMENT:   49 year old female with PMH of DM, HLD, and tobacco abuse. Presented on 03/08/20 with right-sided hemiparesis and aphasia. Pt was found to have left MCA CVA. Pt underwent thrombectomy per IR. Pt required intubation and underwent tracheostomy on 03/13/20. Pt is currently NPO with Cortrak in place. Admitted to CIR on 10/13.   Pt remains NPO. Tube feeds infusing via Cortrak. Unable to obtain diet and weight history at this time.  Reviewed weight history in chart. Pt with a 2.3 kg weight loss since 12/10/19. This is a 2.5% weight loss in 4 months which is not significant for timeframe. RD will monitor weight trends during admission.  Current TF: Glucerna 1.5 @ 45 ml/hr, ProSource TF 45 ml daily  Medications reviewed and include: colace, SSI q 4 hours, Novolog 10 units q 4 hours, lantus 25 units BID, MVI with minerals, miralax  Labs reviewed. CBG's: 111-142 x 24 hours  NUTRITION - FOCUSED PHYSICAL EXAM:    Most Recent Value  Orbital Region No depletion  Upper Arm Region No depletion  Thoracic and Lumbar Region No depletion  Buccal Region No depletion  Temple Region No depletion   Clavicle Bone Region Mild depletion  Clavicle and Acromion Bone Region Mild depletion  Scapular Bone Region No depletion  Dorsal Hand Mild depletion  Patellar Region No depletion  Anterior Thigh Region No depletion  Posterior Calf Region No depletion  Edema (RD Assessment) None  Hair Reviewed  Eyes Reviewed  Mouth Reviewed  Skin Reviewed  Nails Reviewed       Diet Order:   Diet Order            Diet NPO time specified  Diet effective midnight                 EDUCATION NEEDS:   No education needs have been identified at this time  Skin:  Skin Assessment: Reviewed RN Assessment  Last BM:  03/30/20  Height:   Ht Readings from Last 1 Encounters:  03/31/20 5' (1.524 m)    Weight:   Wt Readings from Last 1 Encounters:  03/31/20 90.1 kg    BMI:  Body mass index is 38.79 kg/m.  Estimated Nutritional Needs:   Kcal:  1800-2000  Protein:  95-110 grams  Fluid:  >/= 1.8 L    Earma Reading, MS, RD, LDN Inpatient Clinical Dietitian Please see [facility]iON for contact information.

## 2020-03-31 NOTE — Progress Notes (Signed)
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 DM (diabetes mellitus), type 2 (HCC)   . Hyperlipidemia   . Hypersomnia   . Sprain of right ankle, initial encounter   . Stroke (HCC)   . Syncope    Past Surgical History:  Past Surgical History:  Procedure Laterality Date  . IR CT HEAD LTD  03/08/2020  . IR PERCUTANEOUS ART THROMBECTOMY/INFUSION INTRACRANIAL INC DIAG ANGIO  03/08/2020  . RADIOLOGY WITH ANESTHESIA N/A 03/08/2020   Procedure: IR WITH ANESTHESIA;  Surgeon: Julieanne Cotton, MD;  Location: [facility] OR;  Service: Radiology;  Laterality: N/A;   Social History:  reports that she has been smoking cigarettes. She has been smoking about 0.50 packs per day. She has never used smokeless tobacco. She reports previous alcohol use. She reports previous drug use.  Family / Support Systems Marital Status: Single Patient Roles: Partner Spouse/Significant Other: Ambulance person (Significant Other) Children: Fayrene Fearing (son) (reports being patient POA-awaiting copy) Other Supports: Sister Lurena Joiner), Mother Junious Dresser) Anticipated Caregiver: Fayrene Fearing (son) and [name] (Significant Other) Ability/Limitations of Caregiver: One works during days, one works at Sport and exercise psychologist: 24/7 Family Dynamics: Pt mother and son have personal concerns  Social History Preferred language: English Religion:  Read: Yes Write: Yes Employment Status: Unemployed Marine scientist Issues: Son reports having POA for mom, awaiting documentation   Abuse/Neglect Abuse/Neglect Assessment Can Be Completed: Unable to assess, patient is non-responsive or altered mental  status  Emotional Status Pt's affect, behavior and adjustment status: no Recent Psychosocial Issues: no Psychiatric History: no Substance Abuse History: no  Patient / Family Perceptions, Expectations & Goals Pt/Family understanding of illness & functional limitations: Pt son and significant updated thus far, will determine primary contact once son provides documentation (Family understands ans agrees) Premorbid pt/family roles/activities: Significant Other/Mother/Grandmother. Anticipated changes in roles/activities/participation: Patient previosuly ambulatory and Independent with ADLS and task. Family will asisst in home Pt/family expectations/goals: Goal to discharge Min A  [employer]: None Premorbid Home Care/DME Agencies: None Transportation available at discharge: Family able to transport Resource referrals recommended: Neuropsychology  Discharge Planning Living Arrangements: Spouse/significant other, Children (Son will be staying with patient and step father) Support Systems: Children, Spouse/significant other, Parent, Other relatives Type of Residence: Private residence (Mobile Home: 7 steps to enter (R and L railings)) Insurance Resources:  (Healthy [employer] Medicaid) Financial Resources: [employer] Financial Screen Referred: No Living Expenses: Psychologist, sport and exercise Management: Other (Comment), Patient, Significant Other Does the patient have any problems obtaining your medications?: No Home Management: Independent Patient/Family Preliminary Plans: Significant Other and Spouse to asisst Care Coordinator Barriers to Discharge: Janina Mayo, Insurance for SNF coverage, Nutrition means Care Coordinator Barriers to Discharge Comments: Insurance (Healthy [place]), [place], [facility] Care Coordinator Anticipated Follow Up Needs: HH/OP DC Planning Additional Notes/Comments: Trach, [place] Expected length of stay: 22-25 Days  Clinical Impression Sw entered room pt mother at bedside  with concerns pertaining to family. Staff stepped out sw spoke with pt and mother. SW introduced self, explained role and process. Mother is concerned because she was not informed of pt being in hospital. Son allowed pt mom to visit today. Sw called pt son at bedside to confirm who would be primary contact and/or  caregiver. Son states "he filled out this information already" SW informed son being that patient is on new floor and pt mom has concerns as well, we would need to confirm this information to ensure everyone is on the same page. SW informed son if he has POA over his mom a copy would need to be provided. Both mom and son agreed that it was ok for mom to be added to chart and to be contacted. Son would like to remain primary contact, but has stated it is ok for his grandmother to receive patient information. No additional concerns. SW will be following up with son before Monday for copy of POA. Once patient is able to participate more with Neuro Psych she will be added to list. SW will continue to follow up.    Andria Rhein 03/31/2020, 1:21 PM

## 2020-03-31 NOTE — Evaluation (Signed)
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 DM (diabetes mellitus), type 2 ([place])   . Hyperlipidemia   . Hypersomnia   . Sprain of right ankle, initial encounter   . Stroke ([place])   . Syncope    Past Surgical History:  Past Surgical History:  Procedure Laterality Date  . IR CT HEAD LTD  03/08/2020  . IR PERCUTANEOUS ART THROMBECTOMY/INFUSION INTRACRANIAL INC DIAG ANGIO  03/08/2020  . RADIOLOGY WITH ANESTHESIA N/A 03/08/2020   Procedure: IR WITH ANESTHESIA;  Surgeon: Luanne Bras, MD;  Location: [place];  Service: Radiology;  Laterality: N/A;    Assessment & Plan Clinical Impression: Deborah [name] is a 49 year old right-handed female history of diabetes mellitus hyperlipidemia and tobacco abuse. History taken from chart review, niece, and therapies due to aphasia. She presented on 03/08/2020 with right side hemiparesis and aphasia to an outside hospital.  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  Urine drug screen positive benzos and barbiturates.  CTA confirming a left M2 cutoff.  She was brought to [facility] underwent thrombectomy per interventional radiology.  MRI showed large territory infarct with moderate hemorrhage and mild subarachnoid hemorrhage.  Edema with mass-effect 7 mm right side  midline shift. Echocardiogram with ejection fraction of 60-65%, no wall motion abnormalities.  EEG negative for seizure.  She required intubation for extended period of time underwent tracheostomy on 03/13/2020 per Dr. Ina Homes.  She was downsized to #4 cuffless trach 03/28/2020.  She is currently NPO with alternative means of nutritional support.  Maintain on Plavix for CVA prophylaxis.  Subcutaneous Lovenox for DVT prophylaxis.  Bouts of urinary retention, placed on low-dose Urecholine.  Therapy evaluations completed with recommendations of physical medicine rehab consult.  Patient was admitted for a comprehensive rehab program.  Please see preadmission assessment from earlier today as well. Patient transferred to CIR on 03/30/2020 .    Patient currently requires total - Max Awith basic self-care skills secondary to muscle weakness, decreased cardiorespiratoy endurance, impaired timing and sequencing, abnormal tone, unbalanced muscle activation, decreased coordination and decreased motor planning, decreased attention to right, right side neglect and decreased motor planning, decreased initiation, decreased attention, decreased awareness, decreased problem solving, decreased safety awareness and delayed processing and decreased sitting balance, decreased standing balance, decreased postural control, hemiplegia and decreased balance strategies.  Prior to hospitalization, patient could complete BADLs and IADLs with independent .  Patient will benefit from skilled intervention to decrease level of assist with basic self-care skills prior to discharge home with care partner.  Anticipate patient will require 24 hour supervision and follow up home health.  OT - End of Session Activity Tolerance: Tolerates 10 - 20 min activity with multiple rests Endurance Deficit: Yes Endurance Deficit Description: patient required multiple rest breaks during ADL OT Assessment Rehab Potential (ACUTE ONLY): Fair OT Patient  demonstrates impairments in the following area(s): Balance;Cognition;Endurance;Motor;Perception;Safety OT Basic ADL's  Functional Problem(s): Grooming;Bathing;Dressing;Toileting OT Transfers Functional Problem(s): Toilet;Tub/Shower OT Additional Impairment(s): Fuctional Use of Upper Extremity OT Plan OT Intensity: Minimum of 1-2 x/day, 45 to 90 minutes OT Frequency: 5 out of 7 days OT Duration/Estimated Length of Stay: 21-24 days OT Treatment/Interventions: Balance/vestibular training;Discharge planning;Functional electrical stimulation;Pain management;Self Care/advanced ADL retraining;Therapeutic Activities;UE/LE Coordination activities;Visual/perceptual remediation/compensation;Therapeutic Exercise;Skin care/wound managment;Patient/family education;Functional mobility training;Disease mangement/prevention;Cognitive remediation/compensation;Community reintegration;DME/adaptive equipment instruction;Neuromuscular re-education;Psychosocial support;Splinting/orthotics;UE/LE Strength taining/ROM;Wheelchair propulsion/positioning OT Self Feeding Anticipated Outcome(s): TBD, currently NPO OT Basic Self-Care Anticipated Outcome(s): Mod overall OT Toileting Anticipated Outcome(s): Mod OT Bathroom Transfers Anticipated Outcome(s): Mod OT Recommendation Recommendations for Other Services: Speech consult Patient destination: Home Follow Up Recommendations: Home health OT Equipment Recommended: To be determined   OT Evaluation Precautions/Restrictions  Precautions Precautions: Fall Precaution Comments: NG, dense R Hemi, trach, aphasic Restrictions Weight Bearing Restrictions: No Vital Signs Therapy Vitals Pulse Rate: 72 Resp: 16 Patient Position (if appropriate): Lying Oxygen Therapy SpO2: 97 % O2 Device: Tracheostomy Collar O2 Flow Rate (L/min): 5 L/min FiO2 (%): 21 % Pain Pain Assessment Pain Scale: Faces Pain Score: 0-No pain Faces Pain Scale: No hurt Home Living/Prior  Functioning Home Living Family/patient expects to be discharged to:: Private residence Living Arrangements: Spouse/significant other, Children (Son will be staying with patient and step father) Available Help at Discharge: Available PRN/intermittently, Family Type of Home: Mobile home Home Access: Level entry Home Layout: One level Bathroom Shower/Tub: Multimedia programmer: Standard Bathroom Accessibility: No Additional Comments: info above per pt's mother, pt also has a ~30 yo son  Lives With: Significant other IADL History Education: completed middle school per Mom Prior Function Level of Independence: Independent with basic ADLs, Independent with transfers, Independent with homemaking with ambulation, Independent with homemaking with wheelchair  Able to Take Stairs?: Yes Driving: Yes Vocation: Unemployed Vision Vision Assessment?: Vision impaired- to be further tested in functional context Additional Comments: R inattention present throughout ADL, aphasia limiting thorough testing Perception  Perception: Impaired Inattention/Neglect: Impaired-to be further tested in functional context (pt does attend to R side occasionally with MAX cues to do so) Praxis Praxis: Impaired Praxis Impairment Details: Initiation;Motor planning Cognition Overall Cognitive Status: Difficult to assess Arousal/Alertness: Awake/alert Orientation Level: Person Year: Other (Comment) (unable to fully and accurately assess d/t cognitive impairment and aphasia 2/2 CVA) Month: September (selected from an option of 3 choices, but unable to fully and accurately assess d/t cognitive impairment and aphasia 2/2 CVA) Day of Week: Other (Comment) (unable to fully and accurately assess d/t cognitive impairment and aphasia 2/2 CVA) Memory: Impaired Immediate Memory Recall:  (unable to fully and accurately assess d/t cognitive impairment and aphasia 2/2 CVA) Memory Recall Sock:  (unable to fully and  accurately assess d/t cognitive impairment and aphasia 2/2 CVA) Memory Recall Blue:  (unable to fully and accurately assess d/t cognitive impairment and aphasia 2/2 CVA) Memory Recall Bed:  (unable to fully and accurately assess d/t cognitive impairment and aphasia 2/2 CVA) Attention: Focused;Sustained Focused Attention: Appears intact Sustained Attention: Impaired Sustained Attention Impairment: Verbal basic;Functional basic Awareness: Impaired Awareness Impairment: Intellectual impairment Problem Solving: Impaired Problem Solving Impairment: Verbal basic;Functional basic Behaviors: Impulsive Safety/Judgment: Impaired Sensation Sensation Light Touch: Appears Intact Hot/Cold: Not tested Proprioception: Impaired by gross assessment Stereognosis: Impaired by gross assessment Coordination Gross Motor Movements are Fluid and Coordinated: No Fine Motor Movements are Fluid and Coordinated: No Finger Nose Finger Test: unable on R UE Heel Shin Test: unable on R LE Motor  Motor Motor: Hemiplegia;Abnormal postural alignment and control Motor -  Skilled Clinical Observations: R hemiplegia, R lateral lean in sitting/standing  Trunk/Postural Assessment  Cervical Assessment Cervical Assessment: Exceptions to [facility] Thoracic Assessment Thoracic Assessment: Exceptions to [facility] Lumbar Assessment Lumbar Assessment: Exceptions to [facility] Postural Control Postural Control: Deficits on evaluation Trunk Control: R lateral lean in sitting Righting Reactions: delayed and inadequate Protective Responses: delayed and inadequate  Balance Balance Balance Assessed: Yes Static Sitting Balance Static Sitting - Balance Support: Right upper extremity supported;Left upper extremity supported;Feet supported Static Sitting - Level of Assistance: 5: Stand by assistance Dynamic Sitting Balance Dynamic Sitting - Balance Support: Left upper extremity supported Dynamic Sitting - Level of Assistance: 4: Min  assist Sitting balance - Comments: Initially Mod A when first coming up to sitting, progressed to CS/CGA and Min for dynamic sitting tasks during ADL Extremity/Trunk Assessment RUE Assessment RUE Assessment: Exceptions to [facility] Active Range of Motion (AROM) Comments: flaccid, no AROM at this time but did notice spontaneous movement in digits at times throughout ADL General Strength Comments: 0/5, flaccid RUE Body System: Neuro Brunstrum levels for arm and hand: Arm;Hand Brunstrum level for arm: Stage I Presynergy Brunstrum level for hand: Stage I Flaccidity LUE Assessment LUE Assessment: Within Functional Limits General Strength Comments: weakness present, unable to perform MMT d/t aphasia/cog  Care Tool Care Tool Self Care Eating     NA - NPO at this time    Oral Care      not attempted d/t safety    Bathing   Body parts bathed by patient: Chest;Abdomen;Right upper leg;Left upper leg;Face Body parts bathed by helper: Right arm;Left arm;Right lower leg;Left lower leg (did not address pericare/buttocks just had new brief on)   Assist Level: Total Assistance - Patient < 25%    Upper Body Dressing(including orthotics)   What is the patient wearing?: Hospital gown only   Assist Level: Maximal Assistance - Patient 25 - 49%    Lower Body Dressing (excluding footwear)   What is the patient wearing?: Hospital gown only Assist for lower body dressing: Maximal Assistance - Patient 25 - 49%    Putting on/Taking off footwear   What is the patient wearing?: Non-skid slipper socks Assist for footwear: Dependent - Patient 0%       Care Tool Toileting Toileting activity Toileting Activity did not occur (Clothing management and hygiene only): N/A (no void or bm) Assist for toileting: Dependent - Patient 0%     Care Tool Bed Mobility Roll left and right activity   Roll left and right assist level: Minimal Assistance - Patient > 75%    Sit to lying activity   Sit to lying assist  level: Maximal Assistance - Patient 25 - 49%    Lying to sitting edge of bed activity   Lying to sitting edge of bed assist level: Moderate Assistance - Patient 50 - 74%     Care Tool Transfers Sit to stand transfer   Sit to stand assist level: Moderate Assistance - Patient 50 - 74%    Chair/bed transfer   Chair/bed transfer assist level: 2 Armed forces training and education officer transfer activity did not occur: Safety/medical concerns       Care Tool Cognition Expression of Ideas and Wants Expression of Ideas and Wants: Rarely/Never expressess or very difficult - rarely/never expresses self or speech is very difficult to understand   Understanding Verbal and Non-Verbal Content Understanding Verbal and Non-Verbal Content: Sometimes understands - understands only basic conversations or simple, direct phrases. Frequently requires cues  to understand   Memory/Recall Ability *first 3 days only Memory/Recall Ability *first 3 days only: Staff names and faces    Refer to Care Plan for Long Term Goals  SHORT TERM GOAL WEEK 1 OT Short Term Goal 1 (Week 1): Pt will perform activity for 10 mins with S for sitting balance OT Short Term Goal 2 (Week 1): Pt will perform UB dress with hemidressing with Mod A OT Short Term Goal 3 (Week 1): Pt will perform sit to stand with LRAD for LB ADLs with Mod of 1 OT Short Term Goal 4 (Week 1): Pt will attend to R side of body consistently during ADL with no more than Mod verbal cues  Recommendations for other services: None    Skilled Intervention: Pt greeted at time of session supine in bed resting but agreeable to OT session, very limited verbal and non verbal communication with pt only giving thumbs up/down for yes/no answers. Attempted written answers to questions but inconsistent follow through with pointing to selection. Pt ed on purpose and plan for OT sessions to focus on maximizing indep with ADL and CIR. See above and below for details.   Bed  mobility supine to sit EOB Mod A with bed rail, pt sat EOB for approx 20-30 minutes during ADL session with varying levels of support initially requiring Min-Mod for sitting balance with R lateral lean and progressing to CGA/CS. UB/LB bathing session with Mod/Max for UB and Max for LB bathing at EOB, pt required max cues to attend to RUE, unable to complete and requiring assist to bathe. Multimodal cues required to problem solve and sequence ADL. Pt did become emotional at times, inconsistently. Donned hospital gown only as pt had NG tube running, Max A to don with instrucitons for hemidressing techniques. Attempted NMR for RUE with lateral leans and weight bearing max A to faciliate. Returned to supine Max A, pt very fatigued. 2 helpers to scoot up in bed. Alarm on, call bell in reach.   Skilled Therapeutic Intervention ADL ADL Eating: NPO Upper Body Bathing: Moderate assistance;Maximal cueing Where Assessed-Upper Body Bathing: Edge of bed Lower Body Bathing: Dependent Where Assessed-Lower Body Bathing: Edge of bed Upper Body Dressing: Maximal assistance Where Assessed-Upper Body Dressing: Edge of bed Lower Body Dressing: Dependent Toileting: Not assessed Mobility  Bed Mobility Bed Mobility: Rolling Right;Rolling Left;Sitting - Scoot to Edge of Bed;Supine to Sit;Sit to Supine Rolling Right: Minimal Assistance - Patient > 75% Rolling Left: Minimal Assistance - Patient > 75% Supine to Sit: Moderate Assistance - Patient 50-74% Sitting - Scoot to Edge of Bed: Maximal Assistance - Patient 25-49% Sit to Supine: Maximal Assistance - Patient 25-49% (pt very fatigued)   Discharge Criteria: Patient will be discharged from OT if patient refuses treatment 3 consecutive times without medical reason, if treatment goals not met, if there is a change in medical status, if patient makes no progress towards goals or if patient is discharged from hospital.  The above assessment, treatment plan, treatment  alternatives and goals were discussed and mutually agreed upon: by patient  Viona Gilmore 03/31/2020, 5:36 PM

## 2020-04-01 ENCOUNTER — Inpatient Hospital Stay (HOSPITAL_COMMUNITY): Payer: Medicaid Other | Admitting: Speech Pathology

## 2020-04-01 ENCOUNTER — Encounter (HOSPITAL_COMMUNITY): Payer: Medicaid Other | Admitting: Speech Pathology

## 2020-04-01 ENCOUNTER — Inpatient Hospital Stay (HOSPITAL_COMMUNITY): Payer: Medicaid Other | Admitting: Occupational Therapy

## 2020-04-01 ENCOUNTER — Inpatient Hospital Stay (HOSPITAL_COMMUNITY): Payer: Medicaid Other

## 2020-04-01 DIAGNOSIS — I63312 Cerebral infarction due to thrombosis of left middle cerebral artery: Secondary | ICD-10-CM | POA: Diagnosis not present

## 2020-04-01 LAB — COMPREHENSIVE METABOLIC PANEL
ALT: 30 U/L (ref 0–44)
AST: 32 U/L (ref 15–41)
Albumin: 3.1 g/dL — ABNORMAL LOW (ref 3.5–5.0)
Alkaline Phosphatase: 94 U/L (ref 38–126)
Anion gap: 14 (ref 5–15)
BUN: 26 mg/dL — ABNORMAL HIGH (ref 6–20)
CO2: 22 mmol/L (ref 22–32)
Calcium: 9.5 mg/dL (ref 8.9–10.3)
Chloride: 101 mmol/L (ref 98–111)
Creatinine, Ser: 0.78 mg/dL (ref 0.44–1.00)
GFR, Estimated: >60 mL/min (ref >60)
Glucose, Bld: 131 mg/dL — ABNORMAL HIGH (ref 70–99)
Potassium: 4.9 mmol/L (ref 3.5–5.1)
Sodium: 137 mmol/L (ref 135–145)
Total Bilirubin: 0.7 mg/dL (ref 0.3–1.2)
Total Protein: 7 g/dL (ref 6.5–8.1)

## 2020-04-01 LAB — CBC WITH DIFFERENTIAL/PLATELET
Abs Immature Granulocytes: 0.14 10*3/uL — ABNORMAL HIGH (ref 0.00–0.07)
Basophils Absolute: 0.1 10*3/uL (ref 0.0–0.1)
Basophils Relative: 1 %
Eosinophils Absolute: 0.5 10*3/uL (ref 0.0–0.5)
Eosinophils Relative: 3 %
HCT: 35.6 % — ABNORMAL LOW (ref 36.0–46.0)
Hemoglobin: 11.5 g/dL — ABNORMAL LOW (ref 12.0–15.0)
Immature Granulocytes: 1 %
Lymphocytes Relative: 20 %
Lymphs Abs: 3 10*3/uL (ref 0.7–4.0)
MCH: 31.6 pg (ref 26.0–34.0)
MCHC: 32.3 g/dL (ref 30.0–36.0)
MCV: 97.8 fL (ref 80.0–100.0)
Monocytes Absolute: 1.1 10*3/uL — ABNORMAL HIGH (ref 0.1–1.0)
Monocytes Relative: 7 %
Neutro Abs: 10 10*3/uL — ABNORMAL HIGH (ref 1.7–7.7)
Neutrophils Relative %: 68 %
Platelets: 400 10*3/uL (ref 150–400)
RBC: 3.64 MIL/uL — ABNORMAL LOW (ref 3.87–5.11)
RDW: 14.2 % (ref 11.5–15.5)
WBC: 14.9 10*3/uL — ABNORMAL HIGH (ref 4.0–10.5)
nRBC: 0 % (ref 0.0–0.2)

## 2020-04-01 LAB — GLUCOSE, CAPILLARY
Glucose-Capillary: 113 mg/dL — ABNORMAL HIGH (ref 70–99)
Glucose-Capillary: 122 mg/dL — ABNORMAL HIGH (ref 70–99)
Glucose-Capillary: 123 mg/dL — ABNORMAL HIGH (ref 70–99)
Glucose-Capillary: 124 mg/dL — ABNORMAL HIGH (ref 70–99)
Glucose-Capillary: 130 mg/dL — ABNORMAL HIGH (ref 70–99)
Glucose-Capillary: 131 mg/dL — ABNORMAL HIGH (ref 70–99)
Glucose-Capillary: 160 mg/dL — ABNORMAL HIGH (ref 70–99)

## 2020-04-01 NOTE — Progress Notes (Signed)
Physical Therapy Session Note  Patient Details  Name: Deborah [name] MRN: [id number] Date of Birth: 12/21/1970  Today's Date: 04/01/2020 PT Individual Time: 3154-0086 PT Individual Time Calculation (min): 70 min   Short Term Goals: Week 1:  PT Short Term Goal 1 (Week 1): Patient will maintain static sitting balance with no more than CGA for >3 mins PT Short Term Goal 2 (Week 1): Patient will require no more than MaxA x1 for bed <> wc transfer PT Short Term Goal 3 (Week 1): Patient will initiate gait training as able  Skilled Therapeutic Interventions/Progress Updates:    Patient received supine in bed, agreeable to PT. She is unable to state whether she has pain, but boyfriend (at bedside) was stating that he thought patient was displaying s/s of a migraine, which she has experienced before. When asking the patient if she had a migraine, she responded with unintelligible sounds. RN able to disconnect tube feeds for therapy session. She was able to come to sitting edge of bed with MinA and remain sitting edge of bed with stand by assist. CGA needed when completing reaching task. ModA x2 for stand pivot to wc leading to the L. She's able to adjust positioning in the wc, but L hip only. Increased volitional movement in R toes noted. PT propelling patient in wc to therapy gym for time management. She was able to stand in // bars x7 with MinA for STS and up to [facility] for postural control + R knee blocking. Mirror provided for visual feedback on posture. Verbal cues to shift weight R in standing. R ankle inversion noted in standing, but it appears as though patient is able to control how inverted her ankle becomes, preventing full inversion. She would benefit from an ankle brace to protect lateral structures of ankle for all standing and gait tasks. Patient returning to bed for MBS via squat pivot MaxA x2 when leading L. Transport present to bring patient to MBS.   Therapy Documentation Precautions:   Precautions Precautions: Fall Precaution Comments: NG, dense R Hemi, trach, aphasic Restrictions Weight Bearing Restrictions: No    Therapy/Group: Individual Therapy  Elizebeth Koller, PT, DPT, CBIS 04/01/2020, 7:36 AM

## 2020-04-01 NOTE — Progress Notes (Signed)
Modified Barium Swallow Progress Note  Patient Details  Name: Deborah [name] MRN: [id number] Date of Birth: 25-Apr-1971  Today's Date: 04/01/2020  Modified Barium Swallow completed.  Full report located under Chart Review in the Imaging Section.  Brief recommendations include the following:  Clinical Impression  Patient presents with a moderate oropharyngeal dysphagia with oral phase consisting of decreased bolus cohesion, premature spillage of all boluses to level of vallecular sinus and anterior spillage on right, delayed oral transit. Swallow initiation occured at level of vallecular sinus for puree solids, nectar thick liquids but at level of pyriform sinus for thin liquids. One instance of flash penetration with cup sip of nectar thick liquids and two instances of silent aspiration of trace to min amount with thin liquids during the swallow. No pharyngeal residuals observed post swallows.   Swallow Evaluation Recommendations       SLP Diet Recommendations: Nectar thick liquid;Dysphagia 1 (Puree) solids   Liquid Administration via: Cup   Medication Administration: Crushed with puree   Supervision: Full assist for feeding;Full supervision/cueing for compensatory strategies   Compensations: Minimize environmental distractions;Slow rate;Small sips/bites   Postural Changes: Seated upright at 90 degrees   Oral Care Recommendations: Oral care BID   Other Recommendations: Have oral suction available;Place PMSV during PO intake   Angela Nevin, MA, CCC-SLP Speech Therapy

## 2020-04-01 NOTE — Progress Notes (Signed)
Occupational Therapy Session Note  Patient Details  Name: Deborah [name] MRN: [id number] Date of Birth: 1971/01/26  Today's Date: 04/01/2020 OT Individual Time: 9357-0177 OT Individual Time Calculation (min): 44 min   Short Term Goals: Week 1:  OT Short Term Goal 1 (Week 1): Pt will perform activity for 10 mins with S for sitting balance OT Short Term Goal 2 (Week 1): Pt will perform UB dress with hemidressing with Mod A OT Short Term Goal 3 (Week 1): Pt will perform sit to stand with LRAD for LB ADLs with Mod of 1 OT Short Term Goal 4 (Week 1): Pt will attend to R side of body consistently during ADL with no more than Mod verbal cues  Skilled Therapeutic Interventions/Progress Updates:    Pt greeted in bed, SO present. Per SO, pt has been experiencing a HA. Provided pt with lavender aromatherapy for the pillowcase, RN reported pt is not yet due for her tylenol. Drop arm BSC brought to room to practice toilet transfer/toileting. Mod A x2 for supine<sit and Min A x2 for sit<stand in bariatric Stedy. Facilitation needed for gripping Stedy bar with the Rt hand. Trash can used to elevate feet and a pillow provided for back while she sat to void. Pt a little teary, SO provided emotional support. She was unable to void. Min A x2 for sit<stand in [place] where pt then transferred back to bed. Kinesiotape applied to the Rt UE for edema mgt, arm elevated to address edema as well. At end of session pt remained in bed with all needs within reach and bed alarm set.   02 sats throughout session 92% or higher via continuous pulse ox. Therapy Documentation Precautions:  Precautions Precautions: Fall Precaution Comments: NG, dense R Hemi, trach, aphasic Restrictions Weight Bearing Restrictions: No ADL: ADL Eating: NPO Upper Body Bathing: Moderate assistance, Maximal cueing Where Assessed-Upper Body Bathing: Edge of bed Lower Body Bathing: Dependent Where Assessed-Lower Body Bathing: Edge of  bed Upper Body Dressing: Maximal assistance Where Assessed-Upper Body Dressing: Edge of bed Lower Body Dressing: Dependent Toileting: Not assessed      Therapy/Group: Individual Therapy  Rodgers Likes A Adalei Novell 04/01/2020, 4:14 PM

## 2020-04-01 NOTE — IPOC Note (Signed)
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 (HCC) s/p attempted revascularization Active Problems:   Left middle cerebral artery stroke [facility])     Functional Problem List: Nursing Behavior, Bladder, Bowel, Endurance, Medication Management, Nutrition, Pain, Perception, Safety  PT Balance, Endurance, Motor, Nutrition, Pain, Perception, Safety, Sensory, Skin Integrity  OT Balance, Cognition, Endurance, Motor, Perception, Safety  SLP Cognition, Nutrition, Linguistic  TR         Basic ADL's: OT Grooming, Bathing, Dressing, Toileting     Advanced  ADL's: OT       Transfers: PT Bed Mobility, Bed to Chair, Car, Occupational psychologist, Research scientist (life sciences): PT Ambulation, Psychologist, prison and probation services, Stairs     Additional Impairments: OT Fuctional Use of Upper Extremity  SLP Swallowing, Communication comprehension, expression    TR      Anticipated Outcomes Item Anticipated Outcome  Self Feeding TBD, currently NPO  Swallowing  minA Dys 1, Thin   Basic self-care  Mod overall  Toileting  Mod   Bathroom Transfers Mod  Bowel/Bladder  min to mod assist  Transfers  Grossly MinA  Locomotion  Grossly ModA  Communication  modA basic expressive/receptive  Cognition  modA basic  Pain  <3  Safety/Judgment  min to mod assist   Therapy Plan: PT Intensity: Minimum of 1-2 x/day ,45 to 90 minutes PT Frequency: 5 out of 7 days PT Duration Estimated Length of Stay: 21-24 days OT Intensity: Minimum of 1-2 x/day, 45 to 90 minutes OT Frequency: 5 out of 7 days OT Duration/Estimated Length of Stay: 21-24 days SLP Intensity: Minumum of 1-2 x/day, 30 to 90 minutes SLP Frequency: 3 to 5 out of 7 days SLP  Duration/Estimated Length of Stay: 3-4 weeks   Due to the current state of emergency, patients may not be receiving their 3-hours of Medicare-mandated therapy.   Team Interventions: Nursing Interventions Patient/Family Education, Bladder Management, Bowel Management, Disease Management/Prevention, Pain Management, Medication Management, Cognitive Remediation/Compensation, Discharge Planning, Psychosocial Support  PT interventions Ambulation/gait training, Cognitive remediation/compensation, Discharge planning, DME/adaptive equipment instruction, Functional mobility training, Pain management, Psychosocial support, Splinting/orthotics, Therapeutic Activities, UE/LE Strength taining/ROM, Visual/perceptual remediation/compensation, Warden/ranger, Community reintegration, Disease management/prevention, Functional electrical stimulation, Neuromuscular re-education, Patient/family education, Skin care/wound management, Stair training, Therapeutic Exercise, UE/LE Coordination activities, Wheelchair propulsion/positioning  OT Interventions Warden/ranger, Discharge planning, Functional electrical stimulation, Pain management, Self Care/advanced ADL retraining, Therapeutic Activities, UE/LE Coordination activities, Visual/perceptual remediation/compensation, Therapeutic Exercise, Skin care/wound managment, Patient/family education, Functional mobility training, Disease mangement/prevention, Cognitive remediation/compensation, Firefighter, Fish farm manager, Neuromuscular re-education, Psychosocial support, Splinting/orthotics, UE/LE Strength taining/ROM, Wheelchair propulsion/positioning  SLP Interventions Cognitive remediation/compensation, Dysphagia/aspiration precaution training, Environmental controls, Cueing hierarchy, Functional tasks, Speech/Language facilitation, Patient/family education, Multimodal communication approach  TR Interventions    SW/CM  Interventions Discharge Planning, Psychosocial Support, Patient/Family Education   Barriers to Discharge MD  Medical stability, Home enviroment access/loayout, Trach, Neurogenic bowel and bladder, Wound care, Lack of/limited family support, Weight, Weight bearing restrictions, Behavior and New oxygen  Nursing Inaccessible home environment, Decreased caregiver support, Home environment access/layout, Incontinence, Lack of/limited family support, Medication compliance    PT Inaccessible home environment, Decreased caregiver support, Trach, Incontinence, Lack of/limited family support, New oxygen    OT      SLP Decreased caregiver support  SW Insurance underwriter, Community education officer for [employer], Nutrition means Insurance ([employer] [place]), [employer], Insurance underwriter   Team Discharge Planning: Destination: PT-Home (pending family support) ,OT- Home , SLP-Skilled Nursing Facility (SNF) Projected Follow-up: PT-Skilled nursing facility, Home health PT, OT-  Home health OT, SLP-Home Health SLP, Skilled Nursing facility, 24 hour supervision/assistance Projected Equipment Needs: PT-To be determined, OT- To be determined, SLP-None recommended by SLP Equipment Details: PT- , OT-  Patient/family involved in discharge planning: PT- Patient, Family member/caregiver,  OT-Patient unable/family or caregiver not available, SLP-Family member/caregiver  MD ELOS: 21-24 days Medical Rehab Prognosis:  Fair Assessment: Pt is a 49 yr old female with L MCA stroke s/p attempted revascularization who required trahc for resp failure and inability to come off vent- just passed her MBS today and on D1 thickened liquids diet- will need IVFs at night to compensate- she is completely aphasic and unable to easily communicate.   Goals are min-mod assist by d/c.     See Team Conference Notes for weekly updates to the plan of care

## 2020-04-01 NOTE — Progress Notes (Signed)
Speech Language Pathology Daily Session Note  Patient Details  Name: Deborah [name] MRN: [id number] Date of Birth: Aug 19, 1970  Today's Date: 04/01/2020 SLP Individual Time: 8469-6295 SLP Individual Time Calculation (min): 30 min  Short Term Goals: Week 1: SLP Short Term Goal 1 (Week 1): Patient will tolerate trials of puree PO's with modA for oral clearance and bolus management at oral phase. SLP Short Term Goal 2 (Week 1): Patient will tolerate PMV during all waking hours with SpO2 at least at 95%. SLP Short Term Goal 3 (Week 1): Patient will identify common objects in field of two by pointing with 70% accuracy and modA SLP Short Term Goal 4 (Week 1): Patient will imitate at phoneme and CV (consonant-vowel) word level with 70% accuracy and mod-maximal cues SLP Short Term Goal 5 (Week 1): Patient will utilize basic level communication board to communicate immediate wants/needs with modA  Skilled Therapeutic Interventions:   Patient seen for skilled ST session to focus on swallow goals and observation of PMV toleration with patient's boyfriend (together 10 years) present. Patient tolerated ice cream bites without overt s/s of aspiration or penetration and minimal delays in oral transit and manipulation. Patient with clear voice but required cues to vocalize/verbalize.  Plan for MBS at noon to assess swallow function. PMV well tolerated with SpO2 % at 95-97. Patient continues to benefit from skilled SLP intervention to maximize swallow function, speech, language and cogntive function prior to discharge.  Pain Pain Assessment Pain Scale: 0-10 Pain Score: 0-No pain Faces Pain Scale: No hurt Patients Stated Pain Goal: 3  Therapy/Group: Individual Therapy   Angela Nevin, MA, CCC-SLP Speech Therapy

## 2020-04-01 NOTE — Progress Notes (Addendum)
[facility] PHYSICAL MEDICINE & REHABILITATION PROGRESS NOTE   Subjective/Complaints:  Pt using thumbs up/down to communicate- but sometimes gives neither and just puts fist out.   Sounds like she might be hurting? Gave thumbs up, wants some pain meds- vocalized some, no verbalization that could understand.   FiO2 up to 40% More dry- BUN up to 26- will need some increase in water boluses with dietician- added 150cc q4 hours yesterday-   LBM was early this AM- incontinent.   ROS: Unable to assess due to aphasia   Objective:   DG Chest Port 1 View  Result Date: 03/30/2020 CLINICAL DATA:  Leukocytosis EXAM: PORTABLE CHEST 1 VIEW COMPARISON:  03/19/2020 FINDINGS: Tracheostomy tube remains in place. Enteric tube courses below the diaphragm with distal tip beyond the inferior margin of the film. Stable heart size. Slightly low lung volumes. Minimal streaky bibasilar opacity. No pleural effusion or pneumothorax. IMPRESSION: Minimal streaky bibasilar opacity, atelectasis versus infiltrate. Electronically Signed   By: Duanne Guess D.O.   On: 03/30/2020 11:03   Recent Labs    03/31/20 0544 04/01/20 0449  WBC 13.0* 14.9*  HGB 11.0* 11.5*  HCT 34.0* 35.6*  PLT 418* 400   Recent Labs    03/31/20 0544 04/01/20 0449  NA 138 137  K 4.4 4.9  CL 103 101  CO2 24 22  GLUCOSE 139* 131*  BUN 23* 26*  CREATININE 0.82 0.78  CALCIUM 9.3 9.5    Intake/Output Summary (Last 24 hours) at 04/01/2020 0923 Last data filed at 04/01/2020 6195 Gross per 24 hour  Intake 1145 ml  Output --  Net 1145 ml        Physical Exam: Vital Signs Blood pressure 119/60, pulse 89, temperature 98 F (36.7 C), temperature source Oral, resp. rate 18, height 5' (1.524 m), weight 88.6 kg, SpO2 94 %. afebrile Constitutional:  pt awake, flat affect, appropriate, NAD HENT: Cortrak in place; o2 via trach- 21% FiO2- which is better than yesterday  Cardiovascular: RRR- no JVD Pulmonary: adequate air  movement, but still a little coarse- slightly better than yesterday-a few wheezes, no R/R  Abdominal: Soft, NT, ND, (+)BS -hypoactive Musculoskeletal:     Comments: No edema or tenderness in extremities  Skin:    General: Skin is warm and dry.  Neurological:     Mental Status: She is alert.     Comments: Alert Global aphasia Makes eye contact with examiner  Motor: Intact in moving LUE- a few movements of LLE when asked to move RLE RUE has trace movement in biceps, triceps, WE and grip- RLE 0/5.   Psychiatric:     Comments: calm; vocalizing, no verbalizations  Assessment/Plan: 1. Functional deficits secondary to L MCA stroke with aphasia, and R heimplegia  which require 3+ hours per day of interdisciplinary therapy in a comprehensive inpatient rehab setting.  Physiatrist is providing close team supervision and 24 hour management of active medical problems listed below.  Physiatrist and rehab team continue to assess barriers to discharge/monitor patient progress toward functional and medical goals  Care Tool:  Bathing    Body parts bathed by patient: Chest, Abdomen, Right upper leg, Left upper leg, Face   Body parts bathed by helper: Right arm, Left arm, Right lower leg, Left lower leg (did not address pericare/buttocks just had new brief on)     Bathing assist Assist Level: Total Assistance - Patient < 25%     Upper Body Dressing/Undressing Upper body dressing   What is the  patient wearing?: Hospital gown only    Upper body assist Assist Level: Maximal Assistance - Patient 25 - 49%    Lower Body Dressing/Undressing Lower body dressing      What is the patient wearing?: Hospital gown only     Lower body assist Assist for lower body dressing: Maximal Assistance - Patient 25 - 49%     Toileting Toileting Toileting Activity did not occur (Clothing management and hygiene only): N/A (no void or bm)  Toileting assist Assist for toileting: Dependent - Patient 0%      Transfers Chair/bed transfer  Transfers assist     Chair/bed transfer assist level: 2 Helpers     Locomotion Ambulation   Ambulation assist   Ambulation activity did not occur: Safety/medical concerns (due to patient fatigue/weakness)          Walk 10 feet activity   Assist  Walk 10 feet activity did not occur: Safety/medical concerns (due to patient fatigue/weakness)        Walk 50 feet activity   Assist Walk 50 feet with 2 turns activity did not occur: Safety/medical concerns (due to patient fatigue/weakness)         Walk 150 feet activity   Assist Walk 150 feet activity did not occur: Safety/medical concerns (due to patient fatigue/weakness)         Walk 10 feet on uneven surface  activity   Assist Walk 10 feet on uneven surfaces activity did not occur: Safety/medical concerns (due to patient fatigue/weakness)         Wheelchair     Assist Will patient use wheelchair at discharge?: Yes Type of Wheelchair: Manual    Wheelchair assist level: Dependent - Patient 0% (TIS wc) Max wheelchair distance: 150    Wheelchair 50 feet with 2 turns activity    Assist        Assist Level: Dependent - Patient 0%   Wheelchair 150 feet activity     Assist      Assist Level: Dependent - Patient 0%   Blood pressure 119/60, pulse 89, temperature 98 F (36.7 C), temperature source Oral, resp. rate 18, height 5' (1.524 m), weight 88.6 kg, SpO2 94 %.  Medical Problem List and Plan: 1.  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  Antithrombotics: -DVT/anticoagulation: Lovenox             -antiplatelet therapy: Plavix 75 mg daily 3. Pain Management: Tylenol as needed 4. Mood: Provide emotional support              -antipsychotic agents: N/A 5. Neuropsych: This patient is not capable of making decisions on her own behalf. 6. Skin/Wound Care: Routine skin checks 7. Fluids/Electrolytes/Nutrition: Routine in and outs. 8.  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  Currently with a #4 cuffless trach as of 03/28/2020. Continue PMV as tolerated. Follow-up speech therapy 9.  Post-stroke Dysphagia.  NPO. Alternative means of nutritional support.  Follow-up speech therapy  10/14- maintain TFs at regular rate- no boluses yet, until speak with SLP. BUN up to 23- will recheck Friday and if still going up, might need increase water boluses  10/15- restarted  Water boluses yesterday per Nutrition- needs labs over weekend to f/u and make sure doing better 10. Uncontrolled diabetes mellitus with hyperglycemia.  Hemoglobin A1c 10.7.  NovoLog 10 units every 4 hours, Lantus insulin 25 units twice daily.  10/15- BGs 101-160- overall >120- doing OK- con't regimen 11.  Urinary retention.  Urecholine 10 mg 3 times daily.               Check PVRs  10/14- ideally want Flomax but pt NPO- will con't Urecholine 12.  Hyperlipidemia. Lipitor 13.  Tobacco abuse. Counseled on appropriate 14. Leukocytosis             WBCs 15.6 on 10/13             Afebrile             Chest x-ray showing? Infiltrates, however more likely atelectasis-continue to monitor clinically  10/14- WBC down to 13- con't to monitor   10/15- WBC up slightly to 14.9- was 15.6 2 days ago- could be due to being dry- no fever- will recheck labs in AM and start ABX if needed over weekend  LOS: 2 days A FACE TO FACE EVALUATION WAS PERFORMED  Jaretzi Droz 04/01/2020, 9:23 AM

## 2020-04-02 LAB — BASIC METABOLIC PANEL
Anion gap: 16 — ABNORMAL HIGH (ref 5–15)
BUN: 31 mg/dL — ABNORMAL HIGH (ref 6–20)
CO2: 21 mmol/L — ABNORMAL LOW (ref 22–32)
Calcium: 9.4 mg/dL (ref 8.9–10.3)
Chloride: 101 mmol/L (ref 98–111)
Creatinine, Ser: 0.91 mg/dL (ref 0.44–1.00)
GFR, Estimated: >60 mL/min (ref >60)
Glucose, Bld: 141 mg/dL — ABNORMAL HIGH (ref 70–99)
Potassium: 4.5 mmol/L (ref 3.5–5.1)
Sodium: 138 mmol/L (ref 135–145)

## 2020-04-02 LAB — CBC WITH DIFFERENTIAL/PLATELET
Abs Immature Granulocytes: 0.14 10*3/uL — ABNORMAL HIGH (ref 0.00–0.07)
Basophils Absolute: 0.1 10*3/uL (ref 0.0–0.1)
Basophils Relative: 1 %
Eosinophils Absolute: 0.5 10*3/uL (ref 0.0–0.5)
Eosinophils Relative: 4 %
HCT: 37.1 % (ref 36.0–46.0)
Hemoglobin: 11.9 g/dL — ABNORMAL LOW (ref 12.0–15.0)
Immature Granulocytes: 1 %
Lymphocytes Relative: 25 %
Lymphs Abs: 3.1 10*3/uL (ref 0.7–4.0)
MCH: 32 pg (ref 26.0–34.0)
MCHC: 32.1 g/dL (ref 30.0–36.0)
MCV: 99.7 fL (ref 80.0–100.0)
Monocytes Absolute: 0.9 10*3/uL (ref 0.1–1.0)
Monocytes Relative: 7 %
Neutro Abs: 7.8 10*3/uL — ABNORMAL HIGH (ref 1.7–7.7)
Neutrophils Relative %: 62 %
Platelets: 389 10*3/uL (ref 150–400)
RBC: 3.72 MIL/uL — ABNORMAL LOW (ref 3.87–5.11)
RDW: 14.2 % (ref 11.5–15.5)
WBC: 12.5 10*3/uL — ABNORMAL HIGH (ref 4.0–10.5)
nRBC: 0 % (ref 0.0–0.2)

## 2020-04-02 LAB — GLUCOSE, CAPILLARY
Glucose-Capillary: 102 mg/dL — ABNORMAL HIGH (ref 70–99)
Glucose-Capillary: 111 mg/dL — ABNORMAL HIGH (ref 70–99)
Glucose-Capillary: 112 mg/dL — ABNORMAL HIGH (ref 70–99)
Glucose-Capillary: 134 mg/dL — ABNORMAL HIGH (ref 70–99)
Glucose-Capillary: 141 mg/dL — ABNORMAL HIGH (ref 70–99)
Glucose-Capillary: 165 mg/dL — ABNORMAL HIGH (ref 70–99)

## 2020-04-02 MED ORDER — FREE WATER
250.0000 mL | Status: DC
  Administered 2020-04-02 – 2020-04-03 (×6): 250 mL

## 2020-04-02 NOTE — Progress Notes (Signed)
Brief Nutrition Note  Discussed free water flushes with MD yesterday with plans for labs to be drawn today and RD to assess need to increase free water flushes.   BUN continues to trend up: 10/13: 21 10/14: 23 10/15: 26 10/16: 31  Current free water flushes are 150 ml q 4 hours. Will increase to 250 ml q 4 hours. With free water from tube feeds, this will provide a total of 2487 ml of free water.   Earma Reading, MS, RD, LDN Inpatient Clinical Dietitian Please see AMiON for contact information.

## 2020-04-03 ENCOUNTER — Inpatient Hospital Stay (HOSPITAL_COMMUNITY): Payer: Medicaid Other

## 2020-04-03 ENCOUNTER — Inpatient Hospital Stay (HOSPITAL_COMMUNITY): Payer: Medicaid Other | Admitting: Occupational Therapy

## 2020-04-03 ENCOUNTER — Inpatient Hospital Stay (HOSPITAL_COMMUNITY): Payer: Medicaid Other | Admitting: Speech Pathology

## 2020-04-03 DIAGNOSIS — I63312 Cerebral infarction due to thrombosis of left middle cerebral artery: Secondary | ICD-10-CM | POA: Diagnosis not present

## 2020-04-03 LAB — GLUCOSE, CAPILLARY
Glucose-Capillary: 103 mg/dL — ABNORMAL HIGH (ref 70–99)
Glucose-Capillary: 114 mg/dL — ABNORMAL HIGH (ref 70–99)
Glucose-Capillary: 125 mg/dL — ABNORMAL HIGH (ref 70–99)
Glucose-Capillary: 133 mg/dL — ABNORMAL HIGH (ref 70–99)
Glucose-Capillary: 145 mg/dL — ABNORMAL HIGH (ref 70–99)
Glucose-Capillary: 209 mg/dL — ABNORMAL HIGH (ref 70–99)

## 2020-04-03 MED ORDER — FREE WATER
250.0000 mL | Status: DC
  Administered 2020-04-03 – 2020-04-28 (×141): 250 mL
  Administered 2020-04-28: 150 mL

## 2020-04-03 NOTE — Progress Notes (Signed)
Speech Language Pathology Daily Session Note  Patient Details  Name: Deborah [name] MRN: [id number] Date of Birth: November 18, 1970  Today's Date: 04/03/2020 SLP Individual Time: 1120-1200 SLP Individual Time Calculation (min): 40 min  Short Term Goals: Week 1: SLP Short Term Goal 1 (Week 1): Patient will tolerate trials of puree PO's with modA for oral clearance and bolus management at oral phase. SLP Short Term Goal 2 (Week 1): Patient will tolerate PMV during all waking hours with SpO2 at least at 95%. SLP Short Term Goal 3 (Week 1): Patient will identify common objects in field of two by pointing with 70% accuracy and modA SLP Short Term Goal 4 (Week 1): Patient will imitate at phoneme and CV (consonant-vowel) word level with 70% accuracy and mod-maximal cues SLP Short Term Goal 5 (Week 1): Patient will utilize basic level communication board to communicate immediate wants/needs with modA  Skilled Therapeutic Interventions:  Pt was seen for skilled ST targeting goals for communication and dysphagia.  Pt consumed dys 1 textures and nectar thick liquids with no overt s/s of aspiration.  Pt had mild anterior loss of both liquid and pureed boluses, of which pt had awareness but needed assistance to correct.  Recommend that pt remain on her currently prescribed diet.  Pt's attempts at functional communication were initially characterized by high pitched, strained vocalizations that were unintelligible in any context.  Pt was quite tearful when coming up against moments of communication breakdown but remained willing to participate in treatment with encouragement.  Pt was able to produce approximations of real words when singing basic, familiar songs in unison with therapist in <25% of opportunities with max assist multimodal cues.  However, when listening to personally meaningful songs (specifically [employer] "Believer") pt was able to produce slightly more complex words with greater consistency.   Pt became quite animated when listening to songs that she enjoyed which improved her participation in treatment.  Pt was left in bed with bed alarm set and call bell within reach.  Continue per current plan of care.    Pain Pain Assessment Pain Scale: Faces Faces Pain Scale: Hurts even more Pain Type: Acute pain Pain Location: Head Pain Descriptors / Indicators: Headache Pain Intervention(s): RN made aware  Therapy/Group: Individual Therapy  Thuy Atilano, Melanee Spry 04/03/2020, 12:42 PM

## 2020-04-03 NOTE — Progress Notes (Signed)
Physical Therapy Session Note  Patient Details  Name: Deborah [name] MRN: [id number] Date of Birth: 12-31-1970  Today's Date: 04/03/2020 PT Individual Time: 0902-1002 and 1308-6578  PT Individual Time Calculation (min): 60 min  and 17 min Today's Date: 04/03/2020 PT Missed Time: 15 Minutes and 28 minutes Missed Time Reason: Pain and fatigue/unwillingness to participate   Short Term Goals: Week 1:  PT Short Term Goal 1 (Week 1): Patient will maintain static sitting balance with no more than CGA for >3 mins PT Short Term Goal 2 (Week 1): Patient will require no more than MaxA x1 for bed <> wc transfer PT Short Term Goal 3 (Week 1): Patient will initiate gait training as able  Skilled Therapeutic Interventions/Progress Updates:   Treatment Session 1: 0902-1002 60 min Received pt supine in bed, agreeable to therapy. Pt with aphasia and difficulty communicating but able to express that she has a headache. RN notified and reporting giving pt medication a few minutes prior to therapy session. RN present to disconnect feed. Session with emphasis on functional mobility/transfers, generalized strengthening, dynamic standing balance/coordination, NMR, motor control/sequencing, and improved activity tolerance. Pt with soiled brief and rolled L and R with mod A of 1 and required total A to doff dirty brief, perform peri-care, and don clean brief and pants with +2 assist for time management. Doffed dirty gown and donned clean one with +2 assist and pt transferred supine<>sitting EOB mod A +2 for safety. Pt transferred bed<>TIS WC via squat<>pivot with mod A +2 and transported to dayroom in TIS WC dependently. Pt transferred TIS WC<>mat via squat<>pivot with mod A +2 x 2 additional trials. Pt required cues to place feet on floor prior to transferring, hand placement, and overall technique and noted pt with mild impulsivity. Worked on sit<>stands and dynamic standing balance x 4 reps with mod A +2 and R UE  supported around threapist's shoulder and blocking R knee from buckling while using mirror for visual feedback. Pt with strong R lateral lean requiring max verbal and tactile cues for L weight shifting, upright posture and gaze. Pt frequently spontaneously returning to sitting on mat and becoming emotional and tearing up throughout session. Pt with great difficulty commincating why she was upset frequently responding inappropriately with "yeah". Pt then began moaning and grasping forehead with indication that her migraine was getting worse and wanted to return to room. Pt transported back to room in [facility] total A. Pt agreeable to try sitting in TIS WC. Concluded session with pt semi-reclined in TIS WC, needs within reach, and seatbelt alarm on. Therapist provided cool washcloth for pt and RN aware of pt's current status. 15 minutes missed of skilled physical therapy due to migraine pain.   Treatment Session 2: 1445-1502 17 min Received pt supine in bed with NT present assessing vitals. Pt appeared very fatigued and with aphasia and difficulty communicating responding to all questions with "yeah" and continued mumbling. Instead tried using thumbs up/down hand gestures and pt with mild improved accuracy answering questions. Pt able to communicate that her head was no longer hurting but when asked if she wanted to get OOB, pt declined using thumbs down gesture. Therapist suggested bed level exercises for strengthening however pt continued to decline and proceeded to pull blanket and sheets over face. When asked if pt wanted this therapist to leave to let her rest, pt proceeded with thumbs up hand gestures. Discussed with RN who stated this is the most therapy she has had since arriving to  unit and pt is clearly fatigued. RN suggesting allowing pt to rest this afternoon. Concluded session with pt supine in bed, needs within reach, and bed alarm on. Therapist assisted with positioning hemiplegic arm for support. 28  minutes missed of skilled physical therapy due to fatigue and unwillingness to participate.   Therapy Documentation Precautions:  Precautions Precautions: Fall Precaution Comments: NG, dense R Hemi, trach, aphasic Restrictions Weight Bearing Restrictions: No   Therapy/Group: Individual Therapy Martin Majestic PT, DPT   04/03/2020, 7:18 AM

## 2020-04-03 NOTE — Progress Notes (Signed)
Occupational Therapy Session Note  Patient Details  Name: Deborah [name] MRN: [id number] Date of Birth: 08-13-1970  Today's Date: 04/03/2020 OT Individual Time: 2563-8937 OT Individual Time Calculation (min): 55 min   Short Term Goals: Week 1:  OT Short Term Goal 1 (Week 1): Pt will perform activity for 10 mins with S for sitting balance OT Short Term Goal 2 (Week 1): Pt will perform UB dress with hemidressing with Mod Deborah OT Short Term Goal 3 (Week 1): Pt will perform sit to stand with LRAD for LB ADLs with Mod of 1 OT Short Term Goal 4 (Week 1): Pt will attend to R side of body consistently during ADL with no more than Mod verbal cues  Skilled Therapeutic Interventions/Progress Updates:    Pt greeted in bed with HOB raised. RN present to supervise lunch. OT took over supervising pt, HOH to incorporate the Rt hand to stabilize her magic cup. HOH to use the Rt hand to bring nectar thickened beverages to mouth. Pt required vcs for sustained attention to eating using the fork or spoon with the Lt hand, encouragement required for increasing PO intake. Pt intermittently adjusted herself due to Lt lean/LOBs when sitting upright. Afterwards OT presented pt with Deborah bag of clothes from home and her affect visibly brightened. When shown 2 shirts pt reached out and selected the one she preferred. Supine<sit completed with Mod Deborah on 2nd attempt, on 1st attempt pt began crying and initiated returning to bed. OT provided calming cues and encouragement. Asked pt if she had Deborah HA but no clear symptoms indicating this was the cause of her crying. Note that pt responds to all questions with "yea." While EOB, pt completed sit<stand in [place] with Mod Deborah of 1. OT doffed soiled brief and lowered pants, which were also mildly soiled. Worked on supported standing and Rt sided weightbearing while pt stood in [place] during hygiene and dressing tasks. Note that pt required seated rest <10 seconds of standing. She indicated that  her NG tube was bothering her so we managed to position it inside of her hospital gown pocket. Pt getting teary at times during standing but unable to indicate why she was upset. Teariness absolved with calming cues and gentle redirection/conversation. RN arrived to disconnect tube feed and pt donned her shirt with Max Deborah, though she did initiate assisting with threading the Lt UE and pulling shirt over head. Pt initiated returning to bed at this point so clean pants were donned bedlevel with pt initiating raising the Lt LE and bridging while pulling pants up past thigh, OT stabilizing the Rt LE for weightbearing and improving buttocks clearance during this aspect of dressing. +2 for boosting pt up in bed with pt holding onto her Rt UE for joint protection. Left pt in bed with all needs within reach, Rt UE positioned for edema mgt and joint protection, bed alarm set.   02 sats via trach collar remained 92% and above during session  Therapy Documentation Precautions:  Precautions Precautions: Fall Precaution Comments: NG, dense R Hemi, trach, aphasic Restrictions Weight Bearing Restrictions: No General: General PT Missed Treatment Reason: Patient fatigue;Patient unwilling to participate Vital Signs: Therapy Vitals Temp: 98.3 F (36.8 C) Pulse Rate: 76 Resp: 16 BP: 132/70 Patient Position (if appropriate): Lying Oxygen Therapy SpO2: 98 % O2 Device: Tracheostomy Collar O2 Flow Rate (L/min): 5 L/min FiO2 (%): 21 % Pain: Pain Assessment Pain Scale: Faces Faces Pain Scale: Hurts even more Pain Type: Acute pain Pain Location:  Head Pain Descriptors / Indicators: Headache Pain Intervention(s): RN made aware ADL: ADL Eating: NPO Upper Body Bathing: Moderate assistance, Maximal cueing Where Assessed-Upper Body Bathing: Edge of bed Lower Body Bathing: Dependent Where Assessed-Lower Body Bathing: Edge of bed Upper Body Dressing: Maximal assistance Where Assessed-Upper Body Dressing: Edge  of bed Lower Body Dressing: Dependent Toileting: Not assessed      Therapy/Group: Individual Therapy  Deborah [name] Deborah [name] 04/03/2020, 4:05 PM

## 2020-04-03 NOTE — Progress Notes (Signed)
[place] PHYSICAL MEDICINE & REHABILITATION PROGRESS NOTE   Subjective/Complaints: Able to give thumbs up/down but inconsistent nursing indicates headache earlier today that was medicated with Tylenol difficult to tell whether this was helpful. ROS: Unable to assess due to global aphasia   Objective:   DG Swallowing Func-Speech Pathology  Result Date: 04/01/2020 Objective Swallowing Evaluation: Type of Study: MBS-Modified Barium Swallow Study  Patient Details Name: Tanaysia Bhardwaj MRN: [id number] Date of Birth: 1971-02-08 Today's Date: 04/01/2020 Time: SLP Start Time (ACUTE ONLY): 1157 -SLP Stop Time (ACUTE ONLY): 1224 SLP Time Calculation (min) (ACUTE ONLY): 27 min Past Medical History: Past Medical History: Diagnosis Date . DM (diabetes mellitus), type 2 (HCC)  . Hyperlipidemia  . Hypersomnia  . Sprain of right ankle, initial encounter  . Stroke (HCC)  . Syncope  Past Surgical History: Past Surgical History: Procedure Laterality Date . IR CT HEAD LTD  03/08/2020 . IR PERCUTANEOUS ART THROMBECTOMY/INFUSION INTRACRANIAL INC DIAG ANGIO  03/08/2020 . RADIOLOGY WITH ANESTHESIA N/A 03/08/2020  Procedure: IR WITH ANESTHESIA;  Surgeon: Julieanne Cotton, MD;  Location: [facility] OR;  Service: Radiology;  Laterality: N/A; HPI: 49 y/o female with history of IDDM, HLD, tobacco use disorder presented to [facility] for facial droop, right sided weakness and global aphasia. Found to have LVO of left M1 segment and was transferred to [facility]. Pt underwent left common carotid arteriogram followed by revascularization of L MCA, however it reoccluded due to sever distal M1 stenosis.Pt underwent trach on 9/26.  Subjective: alert, not verbal Assessment / Plan / Recommendation CHL IP CLINICAL IMPRESSIONS 04/01/2020 Clinical Impression Patient presents with a moderate oropharyngeal dysphagia with oral phase consisting of decreased bolus cohesion, premature spillage of all boluses to level of vallecular sinus and anterior  spillage on right, delayed oral transit. Swallow initiation occured at level of vallecular sinus for puree solids, nectar thick liquids but at level of pyriform sinus for thin liquids. One instance of flash penetration with cup sip of nectar thick liquids and two instances of silent aspiration of trace to min amount with thin liquids during the swallow. No pharyngeal residuals observed post swallows. SLP Visit Diagnosis Dysphagia, oropharyngeal phase (R13.12) Attention and concentration deficit following -- Frontal lobe and executive function deficit following -- Impact on safety and function Mild aspiration risk   CHL IP TREATMENT RECOMMENDATION 03/23/2020 Treatment Recommendations Therapy as outlined in treatment plan below   Prognosis 04/01/2020 Prognosis for Safe Diet Advancement Good Barriers to Reach Goals -- Barriers/Prognosis Comment -- CHL IP DIET RECOMMENDATION 04/01/2020 SLP Diet Recommendations Nectar thick liquid;Dysphagia 1 (Puree) solids Liquid Administration via Cup Medication Administration Crushed with puree Compensations Minimize environmental distractions;Slow rate;Small sips/bites Postural Changes Seated upright at 90 degrees   CHL IP OTHER RECOMMENDATIONS 04/01/2020 Recommended Consults -- Oral Care Recommendations Oral care BID Other Recommendations Have oral suction available;Place PMSV during PO intake   CHL IP FOLLOW UP RECOMMENDATIONS 03/30/2020 Follow up Recommendations Inpatient Rehab   CHL IP FREQUENCY AND DURATION 03/23/2020 Speech Therapy Frequency (ACUTE ONLY) min 2x/week Treatment Duration 2 weeks      CHL IP ORAL PHASE 04/01/2020 Oral Phase Impaired Oral - Pudding Teaspoon -- Oral - Pudding Cup -- Oral - Honey Teaspoon -- Oral - Honey Cup -- Oral - Nectar Teaspoon -- Oral - Nectar Cup Premature spillage Oral - Nectar Straw -- Oral - Thin Teaspoon Right anterior bolus loss;Weak lingual manipulation;Premature spillage Oral - Thin Cup Weak lingual manipulation;Premature spillage;Reduced  posterior propulsion Oral - Thin Straw -- Oral - Puree  Weak lingual manipulation;Reduced posterior propulsion;Premature spillage Oral - Mech Soft -- Oral - Regular -- Oral - Multi-Consistency -- Oral - Pill -- Oral Phase - Comment --  CHL IP PHARYNGEAL PHASE 04/01/2020 Pharyngeal Phase Impaired Pharyngeal- Pudding Teaspoon -- Pharyngeal -- Pharyngeal- Pudding Cup -- Pharyngeal -- Pharyngeal- Honey Teaspoon -- Pharyngeal -- Pharyngeal- Honey Cup -- Pharyngeal -- Pharyngeal- Nectar Teaspoon -- Pharyngeal -- Pharyngeal- Nectar Cup Delayed swallow initiation-vallecula;Penetration/Aspiration during swallow Pharyngeal Material enters airway, remains ABOVE vocal cords then ejected out Pharyngeal- Nectar Straw -- Pharyngeal -- Pharyngeal- Thin Teaspoon Delayed swallow initiation-pyriform sinuses Pharyngeal Material does not enter airway Pharyngeal- Thin Cup Delayed swallow initiation-pyriform sinuses;Reduced airway/laryngeal closure;Penetration/Aspiration during swallow;Reduced epiglottic inversion Pharyngeal Material enters airway, passes BELOW cords without attempt by patient to eject out (silent aspiration) Pharyngeal- Thin Straw -- Pharyngeal -- Pharyngeal- Puree Delayed swallow initiation-vallecula Pharyngeal -- Pharyngeal- Mechanical Soft -- Pharyngeal -- Pharyngeal- Regular -- Pharyngeal -- Pharyngeal- Multi-consistency -- Pharyngeal -- Pharyngeal- Pill -- Pharyngeal -- Pharyngeal Comment --  CHL IP CERVICAL ESOPHAGEAL PHASE 04/01/2020 Cervical Esophageal Phase WFL Pudding Teaspoon -- Pudding Cup -- Honey Teaspoon -- Honey Cup -- Nectar Teaspoon -- Nectar Cup -- Nectar Straw -- Thin Teaspoon -- Thin Cup -- Thin Straw -- Puree -- Mechanical Soft -- Regular -- Multi-consistency -- Pill -- Cervical Esophageal Comment -- Angela Nevin, MA, CCC-SLP Speech Therapy             Recent Labs    04/01/20 0449 04/02/20 0436  WBC 14.9* 12.5*  HGB 11.5* 11.9*  HCT 35.6* 37.1  PLT 400 389   Recent Labs     04/01/20 0449 04/02/20 0436  NA 137 138  K 4.9 4.5  CL 101 101  CO2 22 21*  GLUCOSE 131* 141*  BUN 26* 31*  CREATININE 0.78 0.91  CALCIUM 9.5 9.4    Intake/Output Summary (Last 24 hours) at 04/03/2020 0956 Last data filed at 04/02/2020 1819 Gross per 24 hour  Intake 100 ml  Output --  Net 100 ml        Physical Exam: Vital Signs Blood pressure 121/66, pulse 83, temperature 98 F (36.7 C), temperature source Oral, resp. rate 17, height 5' (1.524 m), weight 88.9 kg, SpO2 96 %.  General: No acute distress Mood and affect are appropriate Heart: Regular rate and rhythm no rubs murmurs or extra sounds Lungs: Clear to auscultation, breathing unlabored, no rales or wheezes Abdomen: Positive bowel sounds, soft nontender to palpation, nondistended Extremities: No clubbing, cyanosis, or edema Skin: No evidence of breakdown, no evidence of rash      Comments: Alert Global aphasia Makes eye contact with examiner  Motor: Intact in moving LUE- a few movements of LLE when asked to move RLE RUE has trace movement in biceps, triceps, WE and grip- RLE 0/5.   Psychiatric:     Comments: calm; vocalizing, no verbalizations  Assessment/Plan: 1. Functional deficits secondary to L MCA stroke with aphasia, and R heimplegia  which require 3+ hours per day of interdisciplinary therapy in a comprehensive inpatient rehab setting.  Physiatrist is providing close team supervision and 24 hour management of active medical problems listed below.  Physiatrist and rehab team continue to assess barriers to discharge/monitor patient progress toward functional and medical goals  Care Tool:  Bathing    Body parts bathed by patient: Chest, Abdomen, Right upper leg, Left upper leg, Face   Body parts bathed by helper: Right arm, Left arm, Right upper leg, Left upper leg, Buttocks, Front  perineal area, Right lower leg, Left lower leg, Face, Abdomen, Chest     Bathing assist Assist Level: Total  Assistance - Patient < 25%     Upper Body Dressing/Undressing Upper body dressing   What is the patient wearing?: Hospital gown only    Upper body assist Assist Level: Maximal Assistance - Patient 25 - 49%    Lower Body Dressing/Undressing Lower body dressing      What is the patient wearing?: Incontinence brief     Lower body assist Assist for lower body dressing: Maximal Assistance - Patient 25 - 49%     Toileting Toileting Toileting Activity did not occur (Clothing management and hygiene only): N/A (no void or bm)  Toileting assist Assist for toileting: Dependent - Patient 0%     Transfers Chair/bed transfer  Transfers assist     Chair/bed transfer assist level: 2 Helpers     Locomotion Ambulation   Ambulation assist   Ambulation activity did not occur: Safety/medical concerns (due to patient fatigue/weakness)          Walk 10 feet activity   Assist  Walk 10 feet activity did not occur: Safety/medical concerns (due to patient fatigue/weakness)        Walk 50 feet activity   Assist Walk 50 feet with 2 turns activity did not occur: Safety/medical concerns (due to patient fatigue/weakness)         Walk 150 feet activity   Assist Walk 150 feet activity did not occur: Safety/medical concerns (due to patient fatigue/weakness)         Walk 10 feet on uneven surface  activity   Assist Walk 10 feet on uneven surfaces activity did not occur: Safety/medical concerns (due to patient fatigue/weakness)         Wheelchair     Assist Will patient use wheelchair at discharge?: Yes Type of Wheelchair: Manual    Wheelchair assist level: Dependent - Patient 0% (TIS wc) Max wheelchair distance: 150    Wheelchair 50 feet with 2 turns activity    Assist        Assist Level: Dependent - Patient 0%   Wheelchair 150 feet activity     Assist      Assist Level: Dependent - Patient 0%   Blood pressure 121/66, pulse 83,  temperature 98 F (36.7 C), temperature source Oral, resp. rate 17, height 5' (1.524 m), weight 88.9 kg, SpO2 96 %.  Medical Problem List and Plan: 1.  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  Antithrombotics: -DVT/anticoagulation: Lovenox             -antiplatelet therapy: Plavix 75 mg daily 3. Pain Management: Tylenol as needed 4. Mood: Provide emotional support             -antipsychotic agents: N/A 5. Neuropsych: This patient is not capable of making decisions on her own behalf. 6. Skin/Wound Care: Routine skin checks 7. Fluids/Electrolytes/Nutrition: Routine in and outs. 8.  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  Currently with a #4 cuffless trach as of 03/28/2020. Continue PMV as tolerated. Follow-up speech therapy 9.  Post-stroke Dysphagia.  NPO. Alternative means of nutritional  support.  Follow-up speech therapy  10/14- maintain TFs at regular rate- no boluses yet, until speak with SLP. BUN up to 31- will recheck 10/18, need H2O increased 250 mL every 3 hours, has normal echo 10. Uncontrolled diabetes mellitus with hyperglycemia.  Hemoglobin A1c 10.7.  NovoLog 10 units every 4 hours, Lantus insulin 25 units twice daily.   CBG (last 3)  Recent Labs    04/02/20 2356 04/03/20 0352 04/03/20 0834  GLUCAP 103* 133* 209*  Some lability continue to monitor on current dosages  11.  Urinary retention.  Urecholine 10 mg 3 times daily.               Check PVRs  10/14- ideally want Flomax but pt NPO- will con't Urecholine 12.  Hyperlipidemia. Lipitor 13.  Tobacco abuse. Counseled on appropriate 14. Leukocytosis             WBCs 15.6 on 10/13             Afebrile             Chest x-ray showing? Infiltrates, however more likely atelectasis-continue to monitor  clinically  10/14- WBC down to 13- con't to monitor   10/15- WBC up slightly to 14.9- down t 12.5 K on 10/16 afebrile, recheck in a.m. LOS: 4 days A FACE TO FACE EVALUATION WAS PERFORMED  Erick Colace 04/03/2020, 9:56 AM

## 2020-04-04 ENCOUNTER — Inpatient Hospital Stay (HOSPITAL_COMMUNITY): Payer: Medicaid Other

## 2020-04-04 ENCOUNTER — Inpatient Hospital Stay (HOSPITAL_COMMUNITY): Payer: Medicaid Other | Admitting: Occupational Therapy

## 2020-04-04 ENCOUNTER — Ambulatory Visit: Payer: Medicaid Other | Admitting: Cardiology

## 2020-04-04 DIAGNOSIS — I63312 Cerebral infarction due to thrombosis of left middle cerebral artery: Secondary | ICD-10-CM | POA: Diagnosis not present

## 2020-04-04 LAB — BASIC METABOLIC PANEL
Anion gap: 11 (ref 5–15)
BUN: 29 mg/dL — ABNORMAL HIGH (ref 6–20)
CO2: 26 mmol/L (ref 22–32)
Calcium: 9.4 mg/dL (ref 8.9–10.3)
Chloride: 102 mmol/L (ref 98–111)
Creatinine, Ser: 0.83 mg/dL (ref 0.44–1.00)
GFR, Estimated: >60 mL/min (ref >60)
Glucose, Bld: 140 mg/dL — ABNORMAL HIGH (ref 70–99)
Potassium: 4.4 mmol/L (ref 3.5–5.1)
Sodium: 139 mmol/L (ref 135–145)

## 2020-04-04 LAB — CBC WITH DIFFERENTIAL/PLATELET
Abs Immature Granulocytes: 0.08 10*3/uL — ABNORMAL HIGH (ref 0.00–0.07)
Basophils Absolute: 0.1 10*3/uL (ref 0.0–0.1)
Basophils Relative: 1 %
Eosinophils Absolute: 0.5 10*3/uL (ref 0.0–0.5)
Eosinophils Relative: 4 %
HCT: 35 % — ABNORMAL LOW (ref 36.0–46.0)
Hemoglobin: 11.2 g/dL — ABNORMAL LOW (ref 12.0–15.0)
Immature Granulocytes: 1 %
Lymphocytes Relative: 21 %
Lymphs Abs: 2.2 10*3/uL (ref 0.7–4.0)
MCH: 31.5 pg (ref 26.0–34.0)
MCHC: 32 g/dL (ref 30.0–36.0)
MCV: 98.6 fL (ref 80.0–100.0)
Monocytes Absolute: 1 10*3/uL (ref 0.1–1.0)
Monocytes Relative: 9 %
Neutro Abs: 7 10*3/uL (ref 1.7–7.7)
Neutrophils Relative %: 64 %
Platelets: 358 10*3/uL (ref 150–400)
RBC: 3.55 MIL/uL — ABNORMAL LOW (ref 3.87–5.11)
RDW: 13.9 % (ref 11.5–15.5)
WBC: 10.8 10*3/uL — ABNORMAL HIGH (ref 4.0–10.5)
nRBC: 0 % (ref 0.0–0.2)

## 2020-04-04 LAB — GLUCOSE, CAPILLARY
Glucose-Capillary: 111 mg/dL — ABNORMAL HIGH (ref 70–99)
Glucose-Capillary: 119 mg/dL — ABNORMAL HIGH (ref 70–99)
Glucose-Capillary: 124 mg/dL — ABNORMAL HIGH (ref 70–99)
Glucose-Capillary: 144 mg/dL — ABNORMAL HIGH (ref 70–99)
Glucose-Capillary: 148 mg/dL — ABNORMAL HIGH (ref 70–99)
Glucose-Capillary: 170 mg/dL — ABNORMAL HIGH (ref 70–99)

## 2020-04-04 NOTE — Progress Notes (Signed)
[place] PHYSICAL MEDICINE & REHABILITATION PROGRESS NOTE   Subjective/Complaints: No complaints this morning. Denies constipation, insomnia.  +pain  ROS: Unable to assess due to global aphasia   Objective:   No results found. Recent Labs    04/02/20 0436 04/04/20 0516  WBC 12.5* 10.8*  HGB 11.9* 11.2*  HCT 37.1 35.0*  PLT 389 358   Recent Labs    04/02/20 0436 04/04/20 0516  NA 138 139  K 4.5 4.4  CL 101 102  CO2 21* 26  GLUCOSE 141* 140*  BUN 31* 29*  CREATININE 0.91 0.83  CALCIUM 9.4 9.4    Intake/Output Summary (Last 24 hours) at 04/04/2020 1006 Last data filed at 04/04/2020 0614 Gross per 24 hour  Intake 60 ml  Output 900 ml  Net -840 ml        Physical Exam: Vital Signs Blood pressure 123/62, pulse 82, temperature 98.3 F (36.8 C), temperature source Oral, resp. rate 18, height 5' (1.524 m), weight 91.5 kg, SpO2 96 %.  General: Alert , No apparent distress HEENT: Head is normocephalic, responds to questions with her eyes Neck: Trach in place Heart: Reg rate and rhythm. No murmurs rubs or gallops Chest: CTA bilaterally without wheezes, rales, or rhonchi; no distress Abdomen: Soft, non-tender, non-distended, bowel sounds positive. Extremities: No clubbing, cyanosis, or edema. Pulses are 2+ Skin: Clean and intact without signs of breakdown    Comments: Alert Global aphasia Makes eye contact with examiner  Motor: Intact in moving LUE- a few movements of LLE when asked to move RLE RUE has trace movement in biceps, triceps, WE and grip- RLE 0/5.   Psychiatric:     Comments: calm; vocalizing, no verbalizations   Assessment/Plan: 1. Functional deficits secondary to L MCA stroke with aphasia, and R heimplegia  which require 3+ hours per day of interdisciplinary therapy in a comprehensive inpatient rehab setting.  Physiatrist is providing close team supervision and 24 hour management of active medical problems listed below.  Physiatrist and  rehab team continue to assess barriers to discharge/monitor patient progress toward functional and medical goals  Care Tool:  Bathing    Body parts bathed by patient: Chest, Abdomen, Right upper leg, Left upper leg, Face   Body parts bathed by helper: Right arm, Left arm, Right upper leg, Left upper leg, Buttocks, Front perineal area, Right lower leg, Left lower leg, Face, Abdomen, Chest     Bathing assist Assist Level: Total Assistance - Patient < 25%     Upper Body Dressing/Undressing Upper body dressing   What is the patient wearing?: Pull over shirt    Upper body assist Assist Level: Maximal Assistance - Patient 25 - 49%    Lower Body Dressing/Undressing Lower body dressing      What is the patient wearing?: Incontinence brief, Pants     Lower body assist Assist for lower body dressing: Maximal Assistance - Patient 25 - 49%     Toileting Toileting Toileting Activity did not occur (Clothing management and hygiene only): N/A (no void or bm)  Toileting assist Assist for toileting: Dependent - Patient 0% (dependent 1 assist using Stedy for sit<stand)     Transfers Chair/bed transfer  Transfers assist     Chair/bed transfer assist level: 2 Helpers     Locomotion Ambulation   Ambulation assist   Ambulation activity did not occur: Safety/medical concerns (due to patient fatigue/weakness)          Walk 10 feet activity   Assist  Walk 10 feet activity did not occur: Safety/medical concerns (due to patient fatigue/weakness)        Walk 50 feet activity   Assist Walk 50 feet with 2 turns activity did not occur: Safety/medical concerns (due to patient fatigue/weakness)         Walk 150 feet activity   Assist Walk 150 feet activity did not occur: Safety/medical concerns (due to patient fatigue/weakness)         Walk 10 feet on uneven surface  activity   Assist Walk 10 feet on uneven surfaces activity did not occur: Safety/medical concerns  (due to patient fatigue/weakness)         Wheelchair     Assist Will patient use wheelchair at discharge?: Yes Type of Wheelchair: Manual    Wheelchair assist level: Dependent - Patient 0% (TIS wc) Max wheelchair distance: 150    Wheelchair 50 feet with 2 turns activity    Assist        Assist Level: Dependent - Patient 0%   Wheelchair 150 feet activity     Assist      Assist Level: Dependent - Patient 0%   Blood pressure 123/62, pulse 82, temperature 98.3 F (36.8 C), temperature source Oral, resp. rate 18, height 5' (1.524 m), weight 91.5 kg, SpO2 96 %.  Medical Problem List and Plan: 1.  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  Antithrombotics: -DVT/anticoagulation: Lovenox             -antiplatelet therapy: Plavix 75 mg daily 3. Pain Management: Tylenol as needed 4. Mood: Provide emotional support             -antipsychotic agents: N/A 5. Neuropsych: This patient is not capable of making decisions on her own behalf. 6. Skin/Wound Care: Routine skin checks 7. Fluids/Electrolytes/Nutrition: Routine in and outs. 8.  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  Currently with a #4 cuffless trach as of 03/28/2020. Continue PMV as tolerated. Follow-up speech therapy 9.  Post-stroke Dysphagia.  NPO. Alternative means of nutritional support.  Follow-up speech therapy  10/14- maintain TFs at regular rate- no boluses yet, until speak with SLP. BUN up to 31- will recheck 10/18, need H2O increased 250 mL every 3 hours, has normal echo 10. Uncontrolled diabetes mellitus with hyperglycemia.  Hemoglobin A1c 10.7.  NovoLog 10 units every 4 hours, Lantus insulin 25 units twice daily.   CBG (last 3)  Recent Labs    04/04/20 0014 04/04/20 0354 04/04/20 0819   GLUCAP 119* 124* 170*  CBGs mild-to moderately elevated.  11.  Urinary retention.  Urecholine 10 mg 3 times daily.               Check PVRs  10/14- ideally want Flomax but pt NPO- will con't Urecholine 12.  Hyperlipidemia. Lipitor 13.  Tobacco abuse. Counseled on appropriate 14. Leukocytosis             WBCs 15.6 on 10/13             Afebrile             Chest x-ray showing? Infiltrates, however more likely atelectasis-continue to monitor clinically  10/14-  WBC down to 13- con't to monitor   10/15- WBC up slightly to 14.9- down t 12.5 K on 10/16 afebrile, recheck in a.m.  10/18: WBC trending downward 15. Acute blood loss anemia: Hgb 11.2 on 10/18  LOS: 5 days A FACE TO FACE EVALUATION WAS PERFORMED  Pierce Barocio P Jamesrobert Ohanesian 04/04/2020, 10:06 AM

## 2020-04-04 NOTE — Progress Notes (Signed)
Physical Therapy Session Note  Patient Details  Name: Deborah [name] MRN: [id number] Date of Birth: Jul 31, 1970  Today's Date: 04/04/2020 PT Individual Time: 1040-1120 PT Individual Time Calculation (min): 40 min   Short Term Goals: Week 1:  PT Short Term Goal 1 (Week 1): Patient will maintain static sitting balance with no more than CGA for >3 mins PT Short Term Goal 2 (Week 1): Patient will require no more than MaxA x1 for bed <> wc transfer PT Short Term Goal 3 (Week 1): Patient will initiate gait training as able  Skilled Therapeutic Interventions/Progress Updates:    Patient in supine c/o not feeling well, h/o migraines.  Participated in rolling and side to sit with mod to max A.  Patient seated EOB working on reciprocal scooting to EOB with mod A.  EOB for balance with close S feet supported on floor.  Transfer to w/c via squat pivot to L mod A +2 for safety.  In w/c staff pushed to dayroom.  Along rail performed sit <>stand x 3 with mod A (+2 for safety).  Standing pre-gait with lateral weight shift and cues for stepping, but pt fearful and moaning so completed 3 bouts of pre-gait rocking with mod A and L UE support on rail and facilitation in R hip and knee for weight support during R lateral weight shift.  Patient assisted to room in w/c and left up with alarm belt active and all needs in reach.   Therapy Documentation Precautions:  Precautions Precautions: Fall Precaution Comments: NG, dense R Hemi, trach, aphasic Restrictions Weight Bearing Restrictions: No Pain: Pain Assessment Pain Score: 0-No pain Faces Pain Scale: Hurts little more Pain Type: Acute pain Pain Location: Head Pain Descriptors / Indicators: Headache Pain Onset: On-going Pain Intervention(s): Relaxation;Rest    Therapy/Group: Individual Therapy  [name]  [name], PT 04/04/2020, 5:36 PM

## 2020-04-04 NOTE — Progress Notes (Signed)
Occupational Therapy Session Note  Patient Details  Name: Deborah [name] MRN: [id number] Date of Birth: 01-Jan-1971  Today's Date: 04/04/2020 OT Individual Time: 8295-6213 OT Individual Time Calculation (min): 71 min    Short Term Goals: Week 1:  OT Short Term Goal 1 (Week 1): Pt will perform activity for 10 mins with S for sitting balance OT Short Term Goal 2 (Week 1): Pt will perform UB dress with hemidressing with Mod A OT Short Term Goal 3 (Week 1): Pt will perform sit to stand with LRAD for LB ADLs with Mod of 1 OT Short Term Goal 4 (Week 1): Pt will attend to R side of body consistently during ADL with no more than Mod verbal cues   Skilled Therapeutic Interventions/Progress Updates:    Pt greeted at time of session semireclined in bed resting, continues to have limited communication d/t global aphasia, saying "help" throughout session at times when not appropriate, and inconsistent thumb up/down for yes/no. However, pt able to follow simple commands with approx 75% accuracy. Supine to sit Mod, Sit to stand at [employer], transferred via [place] to wheelchair with 2 helpers for safety. Set up at sink level and performed UB/LB bathing at sink with Mod for UB with frequent multimodal cues for sequencing/problem solving and Max/total for LB bathing as pt was able to partially wash periarea in standing at [facility] and thighs in sitting, assist for buttocks and below knee level. Consisted cues throughout to attend to mirror for feedback, assist to manage RUE to support at shoulder, and cues for safety throughout as pt is impulsive. Donned hospital gown with Mod-Max A with hemidressing technique. Stedy with 2 assist back to bed, sit to supine Max A. Alarm on, call bell in reach and RUE elevated with pillow for proper positioning.   Therapy Documentation Precautions:  Precautions Precautions: Fall Precaution Comments: NG, dense R Hemi, trach, aphasic Restrictions Weight Bearing Restrictions:  No     Therapy/Group: Individual Therapy  Erasmo Score 04/04/2020, 5:01 PM

## 2020-04-04 NOTE — Progress Notes (Signed)
Speech Language Pathology Daily Session Note  Patient Details  Name: Deborah [name] MRN: [id number] Date of Birth: Apr 13, 1971  Today's Date: 04/04/2020 SLP Individual Time: 0801-0900 SLP Individual Time Calculation (min): 59 min  Short Term Goals: Week 1: SLP Short Term Goal 1 (Week 1): Patient will tolerate trials of puree PO's with modA for oral clearance and bolus management at oral phase. SLP Short Term Goal 2 (Week 1): Patient will tolerate PMV during all waking hours with SpO2 at least at 95%. SLP Short Term Goal 3 (Week 1): Patient will identify common objects in field of two by pointing with 70% accuracy and modA SLP Short Term Goal 4 (Week 1): Patient will imitate at phoneme and CV (consonant-vowel) word level with 70% accuracy and mod-maximal cues SLP Short Term Goal 5 (Week 1): Patient will utilize basic level communication board to communicate immediate wants/needs with modA  Skilled Therapeutic Interventions:Skilled ST services focused on swallow and language skills. Pt was consuming breakfast with NT upon entering room. Pt demonstrated ability to self- feed with min A to maintain dys 1 textures on spoon. Pt demonstrated x1 delayed cough during minimal PO consumption of dys 1 textures and refused nectar thick liquids. SLP facilitated expressive and receptive language skills. Pt's PMSV was in place during PO consumption as well as during entire treatment session, spO2 stats remained 96-97%. Pt was unable to identify requested common objects in a field of 2 with max A verbal cues. SLP adjusted task to matching photographs to common objects in a field of two, pt demonstrated accuracy in 8 out 10 trials and when matching photograph of actions with common objects in a field of 2 pt demonstrated accuracy in 5 out 5 trials with mod A semantic cues. Pt was then able to identify requested object in a field of two in 8 out 10 trials with min A semantic cues. Pt demonstrated ability to repeat x9  requested names of objects. Pt was left in room with call bell within reach and bed alarm set. SLP recommends to continue skilled services.     Pain Pain Assessment Pain Score: 0-No pain  Therapy/Group: Individual Therapy  Haley Roza  [facility] 04/04/2020, 4:58 PM

## 2020-04-05 ENCOUNTER — Inpatient Hospital Stay (HOSPITAL_COMMUNITY): Payer: Medicaid Other

## 2020-04-05 ENCOUNTER — Encounter (HOSPITAL_COMMUNITY): Payer: Medicaid Other | Admitting: Psychology

## 2020-04-05 ENCOUNTER — Inpatient Hospital Stay (HOSPITAL_COMMUNITY): Payer: Medicaid Other | Admitting: Occupational Therapy

## 2020-04-05 DIAGNOSIS — I63312 Cerebral infarction due to thrombosis of left middle cerebral artery: Secondary | ICD-10-CM | POA: Diagnosis not present

## 2020-04-05 LAB — GLUCOSE, CAPILLARY
Glucose-Capillary: 109 mg/dL — ABNORMAL HIGH (ref 70–99)
Glucose-Capillary: 110 mg/dL — ABNORMAL HIGH (ref 70–99)
Glucose-Capillary: 122 mg/dL — ABNORMAL HIGH (ref 70–99)
Glucose-Capillary: 124 mg/dL — ABNORMAL HIGH (ref 70–99)
Glucose-Capillary: 130 mg/dL — ABNORMAL HIGH (ref 70–99)
Glucose-Capillary: 150 mg/dL — ABNORMAL HIGH (ref 70–99)
Glucose-Capillary: 97 mg/dL (ref 70–99)

## 2020-04-05 MED ORDER — GLUCERNA 1.5 CAL PO LIQD
1000.0000 mL | ORAL | Status: DC
  Administered 2020-04-05 – 2020-04-17 (×13): 1000 mL
  Filled 2020-04-05 (×14): qty 1000

## 2020-04-05 MED ORDER — PROSOURCE TF PO LIQD
45.0000 mL | Freq: Every day | ORAL | Status: DC
  Administered 2020-04-05 – 2020-04-26 (×21): 45 mL
  Filled 2020-04-05 (×20): qty 45

## 2020-04-05 NOTE — Progress Notes (Signed)
Patient ID: Deborah [name], female   DOB: [birth year], 49 y.o.   MRN: [id number]  Sw entered pt room, niece at bedside. SW attempted X 2 to call pt son for conference updates. Unable to leave voicemail. Will attempt again

## 2020-04-05 NOTE — Progress Notes (Signed)
Physical Therapy Session Note  Patient Details  Name: Deborah [name] MRN: [id number] Date of Birth: 09-08-1970  Today's Date: 04/05/2020 PT Individual Time: 0830-0940 PT Individual Time Calculation (min): 70 min   Short Term Goals: Week 1:  PT Short Term Goal 1 (Week 1): Patient will maintain static sitting balance with no more than CGA for >3 mins PT Short Term Goal 2 (Week 1): Patient will require no more than MaxA x1 for bed <> wc transfer PT Short Term Goal 3 (Week 1): Patient will initiate gait training as able  Skilled Therapeutic Interventions/Progress Updates:    pt received in bed and agreeable to therapy. Nursing brought to bedside for disconnection of NG tube; pt then directed in supine>sit max A with single step cues for rolling to side then manual facilitation for trunk and BLE management, once in sitting pt required mod A for sitting balance initially and improved to CGA for static sitting, pt then directed in squat pivot to WC on pt's L with R knee blocking at max A with 2 helpers and mod A for positioning in WC. Pt then taken to gym total A in WC; directed in squat pivot  max A with similar needs as mentioned; max A for scooting fully onto mat table though pt slightly limited 2/2 height and unable to reach floor without step stool to achieve balance. Pt directed in seated trunk stability at EOM with B feet supported and R UE on mat for improved proprioception for reaching with LUE in various directions cross midline and outside BOS, min A with brief CGA and 2 instances of LOB to R with mod A to right thought with VC and tactile cues pt able to right grossly. Pt incontinent of bowel at mat table and directed in squat pivot to WC max A with two helpers, mod A for positioning, taken back to room in [facility] and directed in squat pivot to toilet with riser in place. Pt required max A with two helpers with multiple attempts to complete transfer and extra time to complete in good position with  pt requiring additional max transfer to squat for improved positioning over toilet. Pt able to void bladder and BM and dependent for all pericare. Pt utilized steady for STS from toilet with two helpers and transferred into [facility] with steady. Pt's O2 monitored throughout session to be no lower than 94% with therapy and at rest at end of session pt at 95% on room air. Pt directed in seated R LE static stretching to prevent contractures 5x30s with ankle DF/PF, hamstring, and functional ROM with total A for allow joint mobility in functional movements x10 hip flexion, knee flexion, extension, ankle DF and PF. Pt left in WC, alarm belt set, O2 vis trach in place, All needs in reach and in good condition. Call light in hand.    Therapy Documentation Precautions:  Precautions Precautions: Fall Precaution Comments: NG, dense R Hemi, trach, aphasic Restrictions Weight Bearing Restrictions: No    Vital Signs: Therapy Vitals Pulse Rate: 74 Resp: 18 Patient Position (if appropriate): Lying Oxygen Therapy SpO2: 93 % O2 Device: Tracheostomy Collar O2 Flow Rate (L/min): 5 L/min FiO2 (%): 21 % Pain: Pain Assessment Pain Scale: 0-10 Pain Score: 0-No pain    Therapy/Group: Individual Therapy  Barbaraann Faster 04/05/2020, 11:27 AM

## 2020-04-05 NOTE — Progress Notes (Signed)
Deborah [name] PHYSICAL MEDICINE & REHABILITATION PROGRESS NOTE   Subjective/Complaints: Participated well with therapy. Had BM today Tolerated therapy session without trach collar  ROS: Unable to assess due to global aphasia   Objective:   No results found. Recent Labs    04/04/20 0516  WBC 10.8*  HGB 11.2*  HCT 35.0*  PLT 358   Recent Labs    04/04/20 0516  NA 139  K 4.4  CL 102  CO2 26  GLUCOSE 140*  BUN 29*  CREATININE 0.83  CALCIUM 9.4    Intake/Output Summary (Last 24 hours) at 04/05/2020 0925 Last data filed at 04/05/2020 0805 Gross per 24 hour  Intake 238 ml  Output 1100 ml  Net -862 ml        Physical Exam: Vital Signs Blood pressure (!) 116/53, pulse 74, temperature 98.2 F (36.8 C), temperature source Oral, resp. rate 18, height 5' (1.524 m), weight 91.6 kg, SpO2 93 %.  General: Alert and oriented x 3, No apparent distress HEENT: Head is normocephalic, responds to questions with her eyes Neck: Trach in place Neck: Supple without JVD or lymphadenopathy Heart: Reg rate and rhythm. No murmurs rubs or gallops Chest: CTA bilaterally without wheezes, rales, or rhonchi; no distress Abdomen: Soft, non-tender, non-distended, bowel sounds positive. Extremities: No clubbing, cyanosis, or edema. Pulses are 2+ Skin: Clean and intact without signs of breakdown    Comments: Alert Global aphasia Makes eye contact with examiner  Motor: Intact in moving LUE- a few movements of LLE when asked to move RLE RUE has trace movement in biceps, triceps, WE and grip- RLE 0/5.   Psychiatric:     Comments: calm; vocalizing, no verbalizations    Assessment/Plan: 1. Functional deficits secondary to L MCA stroke with aphasia, and R heimplegia  which require 3+ hours per day of interdisciplinary therapy in a comprehensive inpatient rehab setting.  Physiatrist is providing close team supervision and 24 hour management of active medical problems listed  below.  Physiatrist and rehab team continue to assess barriers to discharge/monitor patient progress toward functional and medical goals  Care Tool:  Bathing    Body parts bathed by patient: Chest, Abdomen, Right upper leg, Left upper leg, Face, Right arm, Front perineal area   Body parts bathed by helper: Right upper leg, Left upper leg, Buttocks, Front perineal area, Face, Abdomen, Chest, Left arm, Right arm     Bathing assist Assist Level: Maximal Assistance - Patient 24 - 49%     Upper Body Dressing/Undressing Upper body dressing   What is the patient wearing?: Hospital gown only    Upper body assist Assist Level: Maximal Assistance - Patient 25 - 49%    Lower Body Dressing/Undressing Lower body dressing      What is the patient wearing?: Incontinence brief, Pants     Lower body assist Assist for lower body dressing: Maximal Assistance - Patient 25 - 49%     Toileting Toileting Toileting Activity did not occur (Clothing management and hygiene only): N/A (no void or bm)  Toileting assist Assist for toileting: Dependent - Patient 0% (dependent 1 assist using Stedy for sit<stand)     Transfers Chair/bed transfer  Transfers assist     Chair/bed transfer assist level: 2 Helpers     Locomotion Ambulation   Ambulation assist   Ambulation activity did not occur: Safety/medical concerns (due to patient fatigue/weakness)          Walk 10 feet activity   Assist  Walk  10 feet activity did not occur: Safety/medical concerns (due to patient fatigue/weakness)        Walk 50 feet activity   Assist Walk 50 feet with 2 turns activity did not occur: Safety/medical concerns (due to patient fatigue/weakness)         Walk 150 feet activity   Assist Walk 150 feet activity did not occur: Safety/medical concerns (due to patient fatigue/weakness)         Walk 10 feet on uneven surface  activity   Assist Walk 10 feet on uneven surfaces activity did  not occur: Safety/medical concerns (due to patient fatigue/weakness)         Wheelchair     Assist Will patient use wheelchair at discharge?: Yes Type of Wheelchair: Manual    Wheelchair assist level: Dependent - Patient 0% (TIS w/c) Max wheelchair distance: 150    Wheelchair 50 feet with 2 turns activity    Assist        Assist Level: Dependent - Patient 0%   Wheelchair 150 feet activity     Assist      Assist Level: Dependent - Patient 0%   Blood pressure (!) 116/53, pulse 74, temperature 98.2 F (36.8 C), temperature source Oral, resp. rate 18, height 5' (1.524 m), weight 91.6 kg, SpO2 93 %.  Medical Problem List and Plan: 1.  Right side hemiparesis with dysphagia secondary to left MCA infarct status post IR with postprocedural hemorrhage, infarct secondary to large vessel disease             -patient may not shower             -ELOS/Goals: Min/mod A/22-25 days         Continue CIR 2.  Antithrombotics: -DVT/anticoagulation: Lovenox             -antiplatelet therapy: Plavix 75 mg daily 3. Pain Management: Tylenol as needed 4. Mood: Provide emotional support             -antipsychotic agents: N/A 5. Neuropsych: This patient is not capable of making decisions on her own behalf. 6. Skin/Wound Care: Routine skin checks 7. Fluids/Electrolytes/Nutrition: Routine in and outs. 8.  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  Currently with a #4 cuffless trach as of 03/28/2020. Continue PMV as tolerated. Follow-up speech therapy. Tolerating room air.  9.  Post-stroke Dysphagia.  NPO. Alternative means of nutritional support.  Follow-up speech therapy  10/14- maintain TFs at regular rate- no boluses yet, until speak with SLP. BUN up to 31- will recheck 10/18, need H2O increased 250 mL every 3 hours, has normal echo 10. Uncontrolled diabetes mellitus with hyperglycemia.  Hemoglobin A1c 10.7.  NovoLog 10 units every 4 hours, Lantus insulin 25 units twice daily.   CBG (last 3)   Recent Labs    04/05/20 0008 04/05/20 0358 04/05/20 0758  GLUCAP 97 109* 124*  CBGs mildly elevated  11.  Urinary retention.  Urecholine 10 mg 3 times daily.               Check PVRs  10/14- ideally want Flomax but pt NPO- will con't Urecholine 12.  Hyperlipidemia. Lipitor 13.  Tobacco abuse. Counseled on appropriate 14. Leukocytosis             WBCs 15.6 on 10/13             Afebrile             Chest x-ray showing? Infiltrates, however more likely atelectasis-continue to monitor clinically  10/14-  WBC down to 13- con't to monitor   10/15- WBC up slightly to 14.9- down t 12.5 K on 10/16 afebrile, recheck in a.m.  10/18: WBC trending downward 15. Acute blood loss anemia: Hgb 11.2 on 10/18 16. Constipation: Had BM 10/19  LOS: 6 days A FACE TO FACE EVALUATION WAS PERFORMED  Miro Balderson P Turki Tapanes 04/05/2020, 9:25 AM

## 2020-04-05 NOTE — Patient Care Conference (Signed)
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 Sula Soda Care Coodinator Present: Lavera Guise, BSW;Charda Janis Marlyne Beards, RN, BSN, CRRN Nurse Present: Adam Phenix, LPN PT Present: Casimiro Needle, PT OT Present: Earleen Newport, OT PPS Coordinator present : Edson Snowball, Park Breed, SLP     Current Status/Progress Goal Weekly Team Focus  Bowel/Bladder   pt is incont x2, LBM 04/04/20  Pt will regain continence of b/b  Q2h toilting   Swallow/Nutrition/ Hydration   Dys 1 textures and Nectar thick liquids  Mod A  tolerance of current diet, advance solid trials, exercises for premature spillage, tolerance of PMSV   ADL's   Total A bathing, Max A UB EOB, Max-Total A LB dressing, Dependent toileting + toilet transfer using Stedy with +2 assist  Min-Mod A overall  ADL retraining, OOB tolerance, Rt NMR, functional transfers, sitting/standing balance   Mobility   mod +2 transfers squat pivot, dependent w/c in tilt in space, pre-gait at wall rail mod A (+2 safety); super fearful and with R hip/knee buckle during R weight shift.  CGA transfers, gait 50' min A, 8 steps min A, w/c min A  transfers,  upright tolerance, balance sitting and standing, pre-gait w/ facilitation R LE in R stance.   Communication   Max A, gestures response to yes/no questions for wants needs  mod A basic  naming objects/ beginning phonemic sounds, identitfying objects/pictures, copying at letter/word level   Safety/Cognition/ Behavioral Observations  Max-Total A  modA  basic problem solving in functional tasks   Pain   Pt c/o headache, PRN tylenol effective  pt will be free of pain  Assess pain qshift/prn   Skin   no obvious signs of skin breakdown at this time. q2h repositioning  pt will be free of breakdown  assess skin qshift     Discharge Planning:  Goal to discharge back home with son and significant other to provide care   Team Discussion: Incontinent B/B. OT total to max assist lower and upper body bathing and dressing. Stedy +2 for transfers. PT squat pivot transfers +2 mod assist and patient is fearful. SLP had MBS advanced to Dys 1/nectar. Silently aspirates on thins. Mod assist goals for swallowing. Patient is apraxic and requires max assist for gestures and cueing.  Patient on target to meet rehab goals: yes  *See Care Plan and progress notes for long and short-term goals.   Revisions to Treatment Plan:  Not at this time.  Teaching Needs: Continue family education.  Current Barriers to Discharge: Home enviroment access/layout, Trach, Incontinence, Lack of/limited family support, Medication compliance, Nutritional means and elevated blood sugars, impaired communication.  Possible Resolutions to Barriers: Timed toileting schedule, teach trach care, education on diabetes,  create communication board, continue with current medication regimen, continue with supplemental tube feedings.     Medical Summary Current Status: Satting well on room air, urinating a lot (incontinent at night), incontinent of bowel, mildly elevated CBGs, fear of falling, dysphagia, impaired communication  Barriers to  Discharge: Medication compliance;Medical stability;Behavior;Neurogenic Bowel & Bladder;Incontinence  Barriers to Discharge Comments: urinating a lot (incontinent at night), incontinent of bowel, mildly elevated CBGs, fear of falling, dysphagia, impaired expression>comprehension Possible Resolutions to [employer] Focus: timed voiding, purewick at night, bowel program, CBGs AC/HS, continue Lantus, continue cortrak and repeat MBS   Continued Need for Acute Rehabilitation Level of Care: The patient requires daily medical management by a physician with specialized training in physical medicine and rehabilitation for the following reasons: Direction of a multidisciplinary physical rehabilitation program to maximize functional independence : Yes Medical management of patient stability for increased activity during participation in an intensive rehabilitation regime.: Yes Analysis of laboratory values and/or radiology reports with any subsequent need for medication adjustment and/or medical intervention. : Yes   I attest that I was present, lead the team conference, and concur with the assessment and plan of the team.   Tennis Must 04/05/2020, 5:18 PM

## 2020-04-05 NOTE — Progress Notes (Signed)
Speech Language Pathology Daily Session Note  Patient Details  Name: Deborah [name] MRN: [id number] Date of Birth: 01-Nov-1970  Today's Date: 04/05/2020 SLP Individual Time: 1003-1100 SLP Individual Time Calculation (min): 57 min  Short Term Goals: Week 1: SLP Short Term Goal 1 (Week 1): Patient will tolerate trials of puree PO's with modA for oral clearance and bolus management at oral phase. SLP Short Term Goal 2 (Week 1): Patient will tolerate PMV during all waking hours with SpO2 at least at 95%. SLP Short Term Goal 3 (Week 1): Patient will identify common objects in field of two by pointing with 70% accuracy and modA SLP Short Term Goal 4 (Week 1): Patient will imitate at phoneme and CV (consonant-vowel) word level with 70% accuracy and mod-maximal cues SLP Short Term Goal 5 (Week 1): Patient will utilize basic level communication board to communicate immediate wants/needs with modA  Skilled Therapeutic Interventions:Skilled ST services focused on language skills. Pt gestured and demonstrated nonverbal vocalizations indicating discomfort in chair and eventually in response to yes/no questions indicated desire to get into bed with max A multimodal cues. SLP notified NT and pt was agreeable to wait till end of ST session to transfer into bed. SLP facilitated identification of common objects in a field of 2, initial trial 7 out 10 accuracy improving to 4 out 5 accuracy on second trial with max A semantic cues. Pt demonstrated ability to occasionally repeat names of objects, but in confrontation task was unable to name or repeat on command, suggest due to apraxia. Pt was unable to count 1-5 in unison, but was able to sing "Happy birthday" song in unison fading to on command with 80% accuracy. SLP also facilitated production of CV and CVC initial /b/ speech sounds, pt produced "bus" and "bee" words with max A repetition cues, sentence completion were unsuccessful. Severe perseveration noted in  verbalizations, however pt was able to correct preservation in witting task of copying letter "L." Pt's niece entered room the last few minutes of treatment session. Pt agreeable to allow SLP to educate niece on current communication deficits and goals. Pt's niece supports no cognitive baseline deficits. Pt was left in room with family member, call bell within reach and chair alarm set. SLP recommends to continue skilled services.     Pain Pain Assessment Pain Scale: 0-10 Pain Score: 0-No pain  Therapy/Group: Individual Therapy  [name]  [facility] 04/05/2020, 11:06 AM

## 2020-04-05 NOTE — Progress Notes (Signed)
Nutrition Follow-up  DOCUMENTATION CODES:   Obesity unspecified  INTERVENTION:   Transition to nocturnal tube feeding via Cortrak: - Glucerna 1.5 @ 85 ml/hr to run from 1800 to 0600 (total of 1020 ml) - ProSource TF 45 ml daily - Continue free water flushes of 250 ml q 3 hours while awake (order per MD)  Nocturnal tube feeding regimen provides 1570 kcal, 95 grams of protein, and 774 ml of H2O (87% of kcal needs, 100% of protein needs).  Total free water with flushes: 2274 ml  NUTRITION DIAGNOSIS:   Inadequate oral intake related to dysphagia as evidenced by NPO status.  Progressing, pt now on dysphagia 1 diet with nectar-thick liquids  GOAL:   Patient will meet greater than or equal to 90% of their needs  Progressing  MONITOR:   Diet advancement, Labs, Weight trends, TF tolerance, Skin, I & O's  REASON FOR ASSESSMENT:   Consult Enteral/tube feeding initiation and management  ASSESSMENT:   49 year old female with PMH of DM, HLD, and tobacco abuse. Presented on 03/08/20 with right-sided hemiparesis and aphasia. Pt was found to have left MCA CVA. Pt underwent thrombectomy per IR. Pt required intubation and underwent tracheostomy on 03/13/20. Pt is currently NPO with Cortrak in place. Admitted to CIR on 10/13.  10/15 - diet advanced to dysphagia 1 with nectar-thick liquids  RD to transition pt to nocturnal tube feeds to stimulate appetite during the day. Have messaged RN via Secure Chat to discuss.  Admit weight: 90.1 kg Current weight: 91.6 kg  Pt with non-pitting edema to RUE and BLE.  Current TF: Glucerna 1.5 @ 65 ml/hr, free water flushes of 250 ml q 3 hours while awake  Meal Completion: 5-50%  Medications reviewed and include: colace, SSI q 4 hours, Novolog 10 units q 4 hours, lantus 25 units BID, MVI with minerals daily, miralax  Labs reviewed: BUN 29 (trending back down) CBG's: 97-148 x 24 hours  UOP: 1100 ml x 24 hours  Diet Order:   Diet Order             DIET - DYS 1 Room service appropriate? No; Fluid consistency: Nectar Thick  Diet effective now                 EDUCATION NEEDS:   No education needs have been identified at this time  Skin:  Skin Assessment: Reviewed RN Assessment  Last BM:  04/05/20 medium type 6  Height:   Ht Readings from Last 1 Encounters:  03/31/20 5' (1.524 m)    Weight:   Wt Readings from Last 1 Encounters:  04/05/20 91.6 kg    BMI:  Body mass index is 39.44 kg/m.  Estimated Nutritional Needs:   Kcal:  1800-2000  Protein:  95-110 grams  Fluid:  >/= 1.8 L    Earma Reading, MS, RD, LDN Inpatient Clinical Dietitian Please see AMiON for contact information.

## 2020-04-05 NOTE — Progress Notes (Signed)
Patient ID: Deborah [name], female   DOB: Dec 14, 1970, 49 y.o.   MRN: [id number]  Sw attempted to call patient significant other for updates, no answer. Left voicemail

## 2020-04-05 NOTE — Progress Notes (Signed)
Occupational Therapy Session Note  Patient Details  Name: Deborah [name] MRN: [id number] Date of Birth: 04-Feb-1971  Today's Date: 04/05/2020 OT Individual Time: 4315-4008 OT Individual Time Calculation (min): 58 min    Short Term Goals: Week 1:  OT Short Term Goal 1 (Week 1): Pt will perform activity for 10 mins with S for sitting balance OT Short Term Goal 2 (Week 1): Pt will perform UB dress with hemidressing with Mod A OT Short Term Goal 3 (Week 1): Pt will perform sit to stand with LRAD for LB ADLs with Mod of 1 OT Short Term Goal 4 (Week 1): Pt will attend to R side of body consistently during ADL with no more than Mod verbal cues   Skilled Therapeutic Interventions/Progress Updates:    Pt greeted at time of session semi-reclined in bed resting, agreeable to OT session with slightly more verbal communication today with gestures and yes/no answers. Respiratory present at beginning of session performing suctioning. Pt gesturing toward her meal tray, which was untouched and when asked if wanted to eat lunch, pt said "yes." Supine to sit EOB Mod, scooting to EOB in same manner. Sit to stand at [facility] A, transferred via [place] to wheelchair with 2 helpers for safety. Self feeding session with pt using LUE for managing utensil, hand over hand assist for RUE NMR for "cutting" food items. Pt able to attend to L side of plate with min verbal cues to do so, eating less than 50% of her meal. Cues for small bites. PROM for RUE both seated and later in supine for shoulder, elbow, forearm, wrist, and digit movements to promote functional use. Briefly performed hand over hand with pt performing self ROM to initiate her performing in room to prevent contractures/encourage functional use. Stedy transfer back to bed 2 helpers for safety. Sit to supine Mod A, scooting up in bed dependently with 2 helpers. Alarm on, call bell in reach.   Therapy Documentation Precautions:  Precautions Precautions:  Fall Precaution Comments: NG, dense R Hemi, trach, aphasic Restrictions Weight Bearing Restrictions: No     Therapy/Group: Individual Therapy  Erasmo Score 04/05/2020, 4:36 PM

## 2020-04-06 ENCOUNTER — Inpatient Hospital Stay (HOSPITAL_COMMUNITY): Payer: Medicaid Other

## 2020-04-06 DIAGNOSIS — I63312 Cerebral infarction due to thrombosis of left middle cerebral artery: Secondary | ICD-10-CM | POA: Diagnosis not present

## 2020-04-06 LAB — GLUCOSE, CAPILLARY
Glucose-Capillary: 117 mg/dL — ABNORMAL HIGH (ref 70–99)
Glucose-Capillary: 118 mg/dL — ABNORMAL HIGH (ref 70–99)
Glucose-Capillary: 119 mg/dL — ABNORMAL HIGH (ref 70–99)
Glucose-Capillary: 126 mg/dL — ABNORMAL HIGH (ref 70–99)
Glucose-Capillary: 139 mg/dL — ABNORMAL HIGH (ref 70–99)
Glucose-Capillary: 144 mg/dL — ABNORMAL HIGH (ref 70–99)
Glucose-Capillary: 61 mg/dL — ABNORMAL LOW (ref 70–99)

## 2020-04-06 NOTE — Progress Notes (Signed)
Patient ID: Deborah [name], female   DOB: 14-Jul-1970, 49 y.o.   MRN: [id number]   SW entered room. Mother and staff at bedside. Attempting to provide updates to family (son/significant other), will return.

## 2020-04-06 NOTE — Progress Notes (Signed)
Patient ID: Deborah [name], female   DOB: [birth year], 49 y.o.   MRN: [id number]  Team Conference Report to Patient/Family  Team Conference discussion was reviewed with the patient and caregiver, including goals, any changes in plan of care and target discharge date.  Patient and caregiver express understanding and are in agreement.  The patient has a target discharge date of 04/27/20.  Andria Rhein 04/06/2020, 9:39 AM

## 2020-04-06 NOTE — Progress Notes (Signed)
Occupational Therapy Session Note  Patient Details  Name: Deborah [name] MRN: [id number] Date of Birth: Apr 03, 1971  Today's Date: 04/06/2020 OT Individual Time: 9179-1505 OT Individual Time Calculation (min): 42 min    Short Term Goals: Week 1:  OT Short Term Goal 1 (Week 1): Pt will perform activity for 10 mins with S for sitting balance OT Short Term Goal 2 (Week 1): Pt will perform UB dress with hemidressing with Mod A OT Short Term Goal 3 (Week 1): Pt will perform sit to stand with LRAD for LB ADLs with Mod of 1 OT Short Term Goal 4 (Week 1): Pt will attend to R side of body consistently during ADL with no more than Mod verbal cues  Skilled Therapeutic Interventions/Progress Updates:    1:1. Pt received in bed agreeable to OT. OT applies teds prior to OOB mobility and pt requires A to manage RLE and trunk to sit up on EOB. MIN A for sitting balane and multimodal cuing to maintian midline orientation while placing stedy. Multiple sit to stands with MIN A overall with VC for midline orientation and A for placement on RLE/UE into WB position for transfer to TIS and duing peri care for large BM/Bladder incontinence in brief. Total A to don pants with A to positiong RLE into figure 4 and A to thread LLE/advance pants past hips. Pt requires MAX A for donning pull over shirt with VC for hemi techniques. Exited session with pt seated in TIS, RUE supported on pullow to prevent sublux and exit alarm on. Mother present in room. O2 WNL throughotu session on 3L via trach collar.   Therapy Documentation Precautions:  Precautions Precautions: Fall Precaution Comments: NG, dense R Hemi, trach, aphasic Restrictions Weight Bearing Restrictions: No General:   Vital Signs: Therapy Vitals Temp: 98.1 F (36.7 C) Temp Source: Oral Pulse Rate: 81 Resp: 16 BP: (!) 113/55 Patient Position (if appropriate): Lying Oxygen Therapy SpO2: 96 % O2 Device: Tracheostomy Collar Pain:   ADL: ADL Eating:  NPO Upper Body Bathing: Moderate assistance, Maximal cueing Where Assessed-Upper Body Bathing: Edge of bed Lower Body Bathing: Dependent Where Assessed-Lower Body Bathing: Edge of bed Upper Body Dressing: Maximal assistance Where Assessed-Upper Body Dressing: Edge of bed Lower Body Dressing: Dependent Toileting: Not assessed Vision   Perception    Praxis   Exercises:   Other Treatments:     Therapy/Group: Individual Therapy  Shon Hale 04/06/2020, 6:54 AM

## 2020-04-06 NOTE — Progress Notes (Signed)
Speech Language Pathology Daily Session Note  Patient Details  Name: Juliona Vales MRN: [id number] Date of Birth: 27-Nov-1970  Today's Date: 10/20/201 SLP Individual Time:  1447-1530    Short Term Goals: Week 1: SLP Short Term Goal 1 (Week 1): Patient will tolerate trials of puree PO's with modA for oral clearance and bolus management at oral phase. SLP Short Term Goal 2 (Week 1): Patient will tolerate PMV during all waking hours with SpO2 at least at 95%. SLP Short Term Goal 3 (Week 1): Patient will identify common objects in field of two by pointing with 70% accuracy and modA SLP Short Term Goal 4 (Week 1): Patient will imitate at phoneme and CV (consonant-vowel) word level with 70% accuracy and mod-maximal cues SLP Short Term Goal 5 (Week 1): Patient will utilize basic level communication board to communicate immediate wants/needs with modA  Skilled Therapeutic Interventions:Skilled ST services focused on language skills. SLP facilitated comprehension of yes/no questions. Pt initially demonstrated accuracy in 1 out 5 opportunties in response to biographical and immediate environment questions. SLP presented yes/no picture board, pt demonstrated increase accuracy over several trials, from 7 out 10 accuracy to 5 out 5 accuracy with max A multimodal cues. SLP also focused on recall and problem solving skills utilizing call bell, pt initially required total A following instruction and several trials was able to return demonstration with max A verbal cues to locate bell, however at the end of the session with a 10 minute delay pt required total A. Pt was left in room with call bell within reach and bed alarm set. SLP recommends to continue skilled services.     Pain    Therapy/Group: Individual Therapy  Neela Zecca  [facility] 04/07/2020, 6:31 AM

## 2020-04-06 NOTE — Progress Notes (Signed)
[place] PHYSICAL MEDICINE & REHABILITATION PROGRESS NOTE   Subjective/Complaints: No complaints About to eat lunch.  BP low today; other VSS  ROS: Unable to assess due to global aphasia   Objective:   No results found. Recent Labs    04/04/20 0516  WBC 10.8*  HGB 11.2*  HCT 35.0*  PLT 358   Recent Labs    04/04/20 0516  NA 139  K 4.4  CL 102  CO2 26  GLUCOSE 140*  BUN 29*  CREATININE 0.83  CALCIUM 9.4    Intake/Output Summary (Last 24 hours) at 04/06/2020 1337 Last data filed at 04/06/2020 2440 Gross per 24 hour  Intake 195 ml  Output --  Net 195 ml        Physical Exam: Vital Signs Blood pressure (!) 113/55, pulse 75, temperature 98.1 F (36.7 C), temperature source Oral, resp. rate 16, height 5' (1.524 m), weight 88.4 kg, SpO2 95 %. General: Alert and oriented x 3, No apparent distress HEENT: Head is normocephalic, responds to questions with her eyes Neck: Trach in place Neck: Supple without JVD or lymphadenopathy Heart: Reg rate and rhythm. No murmurs rubs or gallops Chest: CTA bilaterally without wheezes, rales, or rhonchi; no distress Abdomen: Soft, non-tender, non-distended, bowel sounds positive. Extremities: No clubbing, cyanosis, or edema. Pulses are 2+ Skin: Clean and intact without signs of breakdown    Comments: Alert Global aphasia Makes eye contact with examiner  Motor: Intact in moving LUE- a few movements of LLE when asked to move RLE RUE has trace movement in biceps, triceps, WE and grip- RLE 0/5.   Psychiatric:     Comments: calm; vocalizing, no verbalizations  Assessment/Plan: 1. Functional deficits secondary to L MCA stroke with aphasia, and R heimplegia  which require 3+ hours per day of interdisciplinary therapy in a comprehensive inpatient rehab setting.  Physiatrist is providing close team supervision and 24 hour management of active medical problems listed below.  Physiatrist and rehab team continue to assess  barriers to discharge/monitor patient progress toward functional and medical goals  Care Tool:  Bathing    Body parts bathed by patient: Chest, Abdomen, Right upper leg, Left upper leg, Face, Right arm, Front perineal area   Body parts bathed by helper: Right upper leg, Left upper leg, Buttocks, Front perineal area, Face, Abdomen, Chest, Left arm, Right arm     Bathing assist Assist Level: Maximal Assistance - Patient 24 - 49%     Upper Body Dressing/Undressing Upper body dressing   What is the patient wearing?: Hospital gown only    Upper body assist Assist Level: Maximal Assistance - Patient 25 - 49%    Lower Body Dressing/Undressing Lower body dressing      What is the patient wearing?: Incontinence brief, Pants     Lower body assist Assist for lower body dressing: Maximal Assistance - Patient 25 - 49%     Toileting Toileting Toileting Activity did not occur (Clothing management and hygiene only): N/A (no void or bm)  Toileting assist Assist for toileting: Dependent - Patient 0% (dependent 1 assist using Stedy for sit<stand)     Transfers Chair/bed transfer  Transfers assist     Chair/bed transfer assist level: Dependent - mechanical lift (Stedy)     Locomotion Ambulation   Ambulation assist   Ambulation activity did not occur: Safety/medical concerns (due to patient fatigue/weakness)          Walk 10 feet activity   Assist  Walk 10 feet  activity did not occur: Safety/medical concerns (due to patient fatigue/weakness)        Walk 50 feet activity   Assist Walk 50 feet with 2 turns activity did not occur: Safety/medical concerns (due to patient fatigue/weakness)         Walk 150 feet activity   Assist Walk 150 feet activity did not occur: Safety/medical concerns (due to patient fatigue/weakness)         Walk 10 feet on uneven surface  activity   Assist Walk 10 feet on uneven surfaces activity did not occur: Safety/medical  concerns (due to patient fatigue/weakness)         Wheelchair     Assist Will patient use wheelchair at discharge?: Yes Type of Wheelchair: Manual    Wheelchair assist level: Dependent - Patient 0% (TIS w/c) Max wheelchair distance: 150    Wheelchair 50 feet with 2 turns activity    Assist        Assist Level: Dependent - Patient 0%   Wheelchair 150 feet activity     Assist      Assist Level: Dependent - Patient 0%   Blood pressure (!) 113/55, pulse 75, temperature 98.1 F (36.7 C), temperature source Oral, resp. rate 16, height 5' (1.524 m), weight 88.4 kg, SpO2 95 %.  Medical Problem List and Plan: 1.  Right side hemiparesis with dysphagia secondary to left MCA infarct status post IR with postprocedural hemorrhage, infarct secondary to large vessel disease             -patient may not shower             -ELOS/Goals: Min/mod A/22-25 days         Continue CIR 2.  Antithrombotics: -DVT/anticoagulation: Lovenox             -antiplatelet therapy: Plavix 75 mg daily 3. Pain Management: Tylenol as needed. Well controlled.  4. Mood: Provide emotional support             -antipsychotic agents: N/A 5. Neuropsych: This patient is not capable of making decisions on her own behalf. 6. Skin/Wound Care: Routine skin checks 7. Fluids/Electrolytes/Nutrition: Routine in and outs. 8.  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  Currently with a #4 cuffless trach as of 03/28/2020. Continue PMV as tolerated. Follow-up speech therapy. Tolerating room air.  9.  Post-stroke Dysphagia.  NPO. Alternative means of nutritional support.  Follow-up speech therapy  10/14- maintain TFs at regular rate- no boluses yet, until speak with SLP. BUN up to 31- will recheck 10/18, need H2O increased 250 mL every 3 hours, has normal echo 10. Uncontrolled diabetes mellitus with hyperglycemia.  Hemoglobin A1c 10.7.  NovoLog 10 units every 4 hours, Lantus insulin 25 units twice daily.   CBG (last 3)  Recent Labs     04/06/20 0414 04/06/20 0807 04/06/20 1112  GLUCAP 118* 119* 139*  CBGs mildly elevated.  11.  Urinary retention.  Urecholine 10 mg 3 times daily.               Check PVRs  10/14- ideally want Flomax but pt NPO- will con't Urecholine 12.  Hyperlipidemia. Lipitor 13.  Tobacco abuse. Counseled on appropriate 14. Leukocytosis             WBCs 15.6 on 10/13             Afebrile             Chest x-ray showing? Infiltrates, however more likely atelectasis-continue to monitor clinically  10/14- WBC down to 13- con't to monitor   10/15- WBC up slightly to 14.9- down t 12.5 K on 10/16  10/18: WBC trending downward 15. Acute blood loss anemia: Hgb 11.2 on 10/18 16. Constipation: Had BM 10/19 17. Hypotension: Monitor BP TID  LOS: 7 days A FACE TO FACE EVALUATION WAS PERFORMED  Clint Bolder P Takhia Spoon 04/06/2020, 1:37 PM

## 2020-04-06 NOTE — Progress Notes (Signed)
Physical Therapy Session Note  Patient Details  Name: Deborah [name] MRN: [id number] Date of Birth: 08/15/1970  Today's Date: 04/06/2020 PT Individual Time:Session1: 9767-3419 Session2: 1300-1400 PT Individual Time Calculation (min): 42 min   Short Term Goals: Week 2:  PT Short Term Goal 1 (Week 1): Patient will maintain static sitting balance with no more than CGA for >3 mins PT Short Term Goal 2 (Week 1): Patient will require no more than MaxA x1 for bed <> wc transfer PT Short Term Goal 3 (Week 1): Patient will initiate gait training as able  Skilled Therapeutic Interventions/Progress Updates:    Session1:  PAtient up in tilt in space w/c.  Pt's mother present and reports plans to come once a week and is having issues with other family members health right now.  Assisted in chair to dayroom Sit to stand next to wall rail mod A.  Patient performed rocking with R foot placed forward with mod A for R knee blocking, Patient able to take two steps forward with A for R LE progression and blocking in stance and A for balance to avoid overshifting to R.  Patient reports needing to toilet.  Assisted in w/c to room and transferred via Stedy to bathroom with total A for clothing management and hygiene.  Patient needing 3x sit to stand to complete hygiene, don new brief and pulling up pants.  Patient assisted to EOB in [employer] and working on seated reaching to R with mod A for balance and max cues to complete transfer of hygiene items to L side hand to therapist.  Reaching with facilitation for R lateral trunk extension as well.  Patient to supine with mod A for R LE and R UE management.  Left in bed with mother present, call light in reach and bed alarm activated.   Session2:  Patient in supine with HOB elevated, came up to sit with mod A for R UE/LE.  Squat pivot to w/c mod A.  Pushed to therapy gym and transferred to mat with mod A.  Patient seated feet supported on 2" step for balance, and performed  seated reaching.  Sit to supine mod A.  Rolled L mod A and performed sidelying elbow prop weight shifts shoulder to hip with facilitation.  Rolled R min A and performed on R side with A for shoulder positioning and facilitation for R trunk activation.  Side to sit mod A.  Patient pivot to w/c max A to R side.  In hallway sit to stand to railing and +2 A for forward weight shifts onto R foot due to R LE extensor tone at times.  Patient in w/c assisted to room and pivot to bed mod A.  Positioned with all needs in reach and bed alarm activated.   Therapy Documentation Precautions:  Precautions Precautions: Fall Precaution Comments: NG, dense R Hemi, trach, aphasic Restrictions Weight Bearing Restrictions: No Pain: Pain Assessment Pain Scale: Faces Pain Score: 0-No pain Faces Pain Scale: Hurts little more Pain Type: Acute pain Pain Location: Ankle Pain Orientation: Right Pain Descriptors / Indicators: Discomfort Pain Frequency: Intermittent Pain Onset: With Activity Patients Stated Pain Goal: 3 Pain Intervention(s): Repositioned    Therapy/Group: Individual Therapy  [name]  [name], [facility] Pager:[phone] Office:[phone] 04/06/2020  04/06/2020, 12:35 PM

## 2020-04-07 ENCOUNTER — Inpatient Hospital Stay (HOSPITAL_COMMUNITY): Payer: Medicaid Other | Admitting: Occupational Therapy

## 2020-04-07 ENCOUNTER — Inpatient Hospital Stay (HOSPITAL_COMMUNITY): Payer: Medicaid Other

## 2020-04-07 ENCOUNTER — Inpatient Hospital Stay (HOSPITAL_COMMUNITY): Payer: Medicaid Other | Admitting: Speech Pathology

## 2020-04-07 DIAGNOSIS — I63312 Cerebral infarction due to thrombosis of left middle cerebral artery: Secondary | ICD-10-CM | POA: Diagnosis not present

## 2020-04-07 LAB — GLUCOSE, CAPILLARY
Glucose-Capillary: 107 mg/dL — ABNORMAL HIGH (ref 70–99)
Glucose-Capillary: 120 mg/dL — ABNORMAL HIGH (ref 70–99)
Glucose-Capillary: 125 mg/dL — ABNORMAL HIGH (ref 70–99)
Glucose-Capillary: 130 mg/dL — ABNORMAL HIGH (ref 70–99)
Glucose-Capillary: 137 mg/dL — ABNORMAL HIGH (ref 70–99)
Glucose-Capillary: 142 mg/dL — ABNORMAL HIGH (ref 70–99)

## 2020-04-07 MED ORDER — INSULIN GLARGINE 100 UNIT/ML ~~LOC~~ SOLN
24.0000 [IU] | Freq: Two times a day (BID) | SUBCUTANEOUS | Status: DC
  Administered 2020-04-07 – 2020-04-15 (×16): 24 [IU] via SUBCUTANEOUS
  Filled 2020-04-07 (×19): qty 0.24

## 2020-04-07 NOTE — Progress Notes (Signed)
Speech Language Pathology Daily Session Note  Patient Details  Name: Anaily Ashbaugh MRN: [id number] Date of Birth: [birth year]  Today's Date: 04/07/2020 SLP Individual Time: 1430-1500 SLP Individual Time Calculation (min): 30 min  Short Term Goals: Week 1: SLP Short Term Goal 1 (Week 1): Patient will tolerate trials of puree PO's with modA for oral clearance and bolus management at oral phase. SLP Short Term Goal 2 (Week 1): Patient will tolerate PMV during all waking hours with SpO2 at least at 95%. SLP Short Term Goal 3 (Week 1): Patient will identify common objects in field of two by pointing with 70% accuracy and modA SLP Short Term Goal 4 (Week 1): Patient will imitate at phoneme and CV (consonant-vowel) word level with 70% accuracy and mod-maximal cues SLP Short Term Goal 5 (Week 1): Patient will utilize basic level communication board to communicate immediate wants/needs with modA  Skilled Therapeutic Interventions:   Patient seen by SLP for skilled session focusing on receptive and expressive language. Patient able to repeat at word level 3 times but did start to perseverate on saying "ball". She pointed to objects in field of two with 80% accuracy after multiple trials but with object pictures, was approximately 50% accurate. Patient pointed to words in field of two to request (ice cream/yogurt) etc with SLP cues to initiate pointing. Patient continues to benefit from skilled SLP intervention to maximize swallow, speech, cognitive and language function prior to discharge.  Pain Pain Assessment Pain Scale: 0-10 Pain Score: 0-No pain Pain Type: Acute pain Pain Location: Leg Pain Orientation: Left  Therapy/Group: Individual Therapy   Angela Nevin, MA, CCC-SLP Speech Therapy

## 2020-04-07 NOTE — Progress Notes (Signed)
Speech Language Pathology Daily Session Note  Patient Details  Name: Deborah [name] MRN: [id number] Date of Birth: 20-Nov-1970  Today's Date: 04/07/2020 SLP Individual Time: 1200-1230 SLP Individual Time Calculation (min): 30 min  Short Term Goals: Week 1: SLP Short Term Goal 1 (Week 1): Patient will tolerate trials of puree PO's with modA for oral clearance and bolus management at oral phase. SLP Short Term Goal 2 (Week 1): Patient will tolerate PMV during all waking hours with SpO2 at least at 95%. SLP Short Term Goal 3 (Week 1): Patient will identify common objects in field of two by pointing with 70% accuracy and modA SLP Short Term Goal 4 (Week 1): Patient will imitate at phoneme and CV (consonant-vowel) word level with 70% accuracy and mod-maximal cues SLP Short Term Goal 5 (Week 1): Patient will utilize basic level communication board to communicate immediate wants/needs with modA  Skilled Therapeutic Interventions:   Patient seen at lunch meal to address swallow function goals. Patient is in tilt and space wheelchair and per RN in room, she has been complaining via moaning, crying and facial grimacing that she is uncomfortable. Patient did accept 4-5 spoonfuls of puree solids and 2 sips of nectar thick liquids without overt s/s of aspiration or penetration but with mild anterior spillage on right side. Patient more participatory with SLP playing preferred music on phone. Patient continues to benefit from skilled SLP intervention to maximize swallow, speech-language and cogntiive function prior to discharge   Pain Pain Assessment Pain Scale: Faces Pain Score: 6  Pain Type: Acute pain Pain Location: Leg Pain Orientation: Left  Therapy/Group: Individual Therapy   Angela Nevin, MA, CCC-SLP Speech Therapy

## 2020-04-07 NOTE — Progress Notes (Signed)
Deborah [name] PHYSICAL MEDICINE & REHABILITATION PROGRESS NOTE   Subjective/Complaints: No complaints this morning. Denies pain, constipation, insomnia.  Not eating much CBG dropped to 61 yesterday  ROS: Unable to assess due to global aphasia   Objective:   No results found. No results for input(s): WBC, HGB, HCT, PLT in the last 72 hours. No results for input(s): NA, K, CL, CO2, GLUCOSE, BUN, CREATININE, CALCIUM in the last 72 hours.  Intake/Output Summary (Last 24 hours) at 04/07/2020 0918 Last data filed at 04/06/2020 1745 Gross per 24 hour  Intake 128 ml  Output --  Net 128 ml        Physical Exam: Vital Signs Blood pressure (!) 116/50, pulse 86, temperature 98.3 F (36.8 C), resp. rate 16, height 5' (1.524 m), weight 88.4 kg, SpO2 92 %.  General: Alert and oriented x 3, No apparent distress HEENT: Head is normocephalic, responds to questions with her eyes Neck: Trach in place Heart: Reg rate and rhythm. No murmurs rubs or gallops Chest: CTA bilaterally without wheezes, rales, or rhonchi; no distress Abdomen: Soft, non-tender, non-distended, bowel sounds positive. Extremities: No clubbing, cyanosis, or edema. Pulses are 2+ Skin: Clean and intact without signs of breakdown    Comments: Alert Global aphasia Makes eye contact with examiner  Motor: Intact in moving LUE- a few movements of LLE when asked to move RLE RUE has trace movement in biceps, triceps, WE and grip- RLE 0/5.   Psychiatric:     Comments: calm; vocalizing, no verbalizations   Assessment/Plan: 1. Functional deficits secondary to L MCA stroke with aphasia, and R heimplegia  which require 3+ hours per day of interdisciplinary therapy in a comprehensive inpatient rehab setting.  Physiatrist is providing close team supervision and 24 hour management of active medical problems listed below.  Physiatrist and rehab team continue to assess barriers to discharge/monitor patient progress toward functional  and medical goals  Care Tool:  Bathing    Body parts bathed by patient: Chest, Abdomen, Right upper leg, Left upper leg, Face, Right arm, Front perineal area   Body parts bathed by helper: Right upper leg, Left upper leg, Buttocks, Front perineal area, Face, Abdomen, Chest, Left arm, Right arm     Bathing assist Assist Level: Maximal Assistance - Patient 24 - 49%     Upper Body Dressing/Undressing Upper body dressing   What is the patient wearing?: Hospital gown only    Upper body assist Assist Level: Maximal Assistance - Patient 25 - 49%    Lower Body Dressing/Undressing Lower body dressing      What is the patient wearing?: Incontinence brief, Pants     Lower body assist Assist for lower body dressing: Maximal Assistance - Patient 25 - 49%     Toileting Toileting Toileting Activity did not occur (Clothing management and hygiene only): N/A (no void or bm)  Toileting assist Assist for toileting: 2 Helpers (used stedy)     Transfers Chair/bed transfer  Transfers assist     Chair/bed transfer assist level: Moderate Assistance - Patient 50 - 74%     Locomotion Ambulation   Ambulation assist   Ambulation activity did not occur: Safety/medical concerns (due to patient fatigue/weakness)  Assist level: 2 helpers Assistive device: Other (comment) (wall railing) Max distance: 2   Walk 10 feet activity   Assist  Walk 10 feet activity did not occur: Safety/medical concerns (due to patient fatigue/weakness)        Walk 50 feet activity   Assist  Walk 50 feet with 2 turns activity did not occur: Safety/medical concerns (due to patient fatigue/weakness)         Walk 150 feet activity   Assist Walk 150 feet activity did not occur: Safety/medical concerns (due to patient fatigue/weakness)         Walk 10 feet on uneven surface  activity   Assist Walk 10 feet on uneven surfaces activity did not occur: Safety/medical concerns (due to patient  fatigue/weakness)         Wheelchair     Assist Will patient use wheelchair at discharge?: Yes Type of Wheelchair: Manual    Wheelchair assist level: Dependent - Patient 0% (TIS w/c) Max wheelchair distance: 150    Wheelchair 50 feet with 2 turns activity    Assist        Assist Level: Dependent - Patient 0%   Wheelchair 150 feet activity     Assist      Assist Level: Dependent - Patient 0%   Blood pressure (!) 116/50, pulse 86, temperature 98.3 F (36.8 C), resp. rate 16, height 5' (1.524 m), weight 88.4 kg, SpO2 92 %.  Medical Problem List and Plan: 1.  Right side hemiparesis with dysphagia secondary to left MCA infarct status post IR with postprocedural hemorrhage, infarct secondary to large vessel disease             -patient may not shower             -ELOS/Goals: Min/mod A/22-25 days        Continue CIR 2.  Antithrombotics: -DVT/anticoagulation: Lovenox             -antiplatelet therapy: Plavix 75 mg daily 3. Pain Management: Tylenol as needed. Well controlled  4. Mood: Provide emotional support             -antipsychotic agents: N/A 5. Neuropsych: This patient is not capable of making decisions on her own behalf. 6. Skin/Wound Care: Routine skin checks 7. Fluids/Electrolytes/Nutrition: Routine in and outs. 8.  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  Currently with a #4 cuffless trach as of 03/28/2020. Continue PMV as tolerated. Follow-up speech therapy. Tolerating room air.  9.  Post-stroke Dysphagia.  NPO. Alternative means of nutritional support.  Follow-up speech therapy  10/14- maintain TFs at regular rate- no boluses yet, until speak with SLP. BUN up to 31- will recheck 10/18, need H2O increased 250 mL every 3 hours, has normal echo 10. Uncontrolled diabetes mellitus with hyperglycemia.  Hemoglobin A1c 10.7.  NovoLog 10 units every 4 hours, Lantus insulin 25 units twice daily.   CBG (last 3)  Recent Labs    04/06/20 2357 04/07/20 0402 04/07/20 0818   GLUCAP 117* 137* 125*  CBG drop yesterday. Decrease Lantus to 24U.  11.  Urinary retention.  Urecholine 10 mg 3 times daily.               Check PVRs  10/14- ideally want Flomax but pt NPO- will con't Urecholine 12.  Hyperlipidemia. Lipitor 13.  Tobacco abuse. Counseled on appropriate 14. Leukocytosis             WBCs 15.6 on 10/13             Afebrile             Chest x-ray showing? Infiltrates, however more likely atelectasis-continue to monitor clinically  10/14- WBC down to 13- con't to monitor   10/15- WBC up slightly to 14.9- down t 12.5 K on 10/16  10/18:  WBC trending downward 15. Acute blood loss anemia: Hgb 11.2 on 10/18 16. Constipation: Had BM 10/19. Had huge BM 10/21. May d/c Miralax.  17. Hypotension: Monitor BP TID. Asymptomatic. Not on anti-hypertensives.   LOS: 8 days A FACE TO FACE EVALUATION WAS PERFORMED  Deborah [name] 04/07/2020, 9:18 AM

## 2020-04-07 NOTE — Progress Notes (Signed)
Physical Therapy Session Note  Patient Details  Name: Deborah [name] MRN: [id number] Date of Birth: 1970-10-30  Today's Date: 04/07/2020 PT Individual Time:Session1: 6433-2951; Session2:1300-1330 PT Individual Time Calculation (min): 40 min & 30 min  Short Term Goals: Week 1:  PT Short Term Goal 1 (Week 1): Patient will maintain static sitting balance with no more than CGA for >3 mins PT Short Term Goal 2 (Week 1): Patient will require no more than MaxA x1 for bed <> wc transfer PT Short Term Goal 3 (Week 1): Patient will initiate gait training as able  Skilled Therapeutic Interventions/Progress Updates: Session1:  Patient in bed, supine to sit with HOB elevated mod A.  Assisted to don socks and pants in sitting.  Attempted to have pt help with donning pants on L leg, but she kept letting go of them to lift her L LE, so needed total A.  She transferred to tilt in space w/c mod A +2 for safety.  Pushed in her chair to therapy gym and in front of 3" steps stood with mod A holding L railing.  Stood and initiated weight shifts in attempt to tap L foot to 3" step, but patient fearful and moaning so assisted to wall rail in hallway.  Patient took 3-4 steps for 2 bouts of ambulation with +2 A for balance. R LE management, stabilization in stance and progression in swing.  Patient transferred to mat mod A +2 for safety and sit to supine mod A.  Scooting over with mod A and max cues.   Patient rolled R min A and attempted elbow prop on R for weight bearing and weight shifts, but pt c/o pain and did not tolerate so returned to supine.  Assisted into long sitting due to cough while supine so pt scooting to EOM mod A and transferred to R side into w/c mod A +2 for safety.  She was assisted to room and left up in w/c for lunch and next session at 1pm despite asking for back to bed.    Session2: Patient in tilt in space w/c seemingly uncomfortable.  Pivot to bed to L with mod A.  Then to hemi height w/c with mod  A.  Positioned for mobilizing in w/c with L UE/LE and patient propelled x 60' then 75' with mod A for L LE technique for steering and for encouragement as pt c/o fatigue.  Patient assisted to room and pivot to bed via stand pivot with mod A.  Sit to supine mod A for R UE/LE.  Pt scooting up in bed with S using railing and bed in trendelenberg.  Patient left with needs in reach and bed alarm activated.      Therapy Documentation Precautions:  Precautions Precautions: Fall Precaution Comments: NG, dense R Hemi, trach, aphasic Restrictions Weight Bearing Restrictions: No Pain: Pain Assessment Pain Scale: Faces Faces Pain Scale: Hurts whole lot Pain Type: Acute pain Pain Location: Shoulder Pain Orientation: Right Pain Descriptors / Indicators: Discomfort;Grimacing;Moaning Pain Onset: With Activity Pain Intervention(s): Rest;Repositioned    Therapy/Group: Individual Therapy  [name]  [name], PT 04/07/2020, 12:45 PM

## 2020-04-07 NOTE — Progress Notes (Signed)
Occupational Therapy Weekly Progress Note  Patient Details  Name: Deborah [name] MRN: [id number] Date of Birth: [birth year]  Beginning of progress report period: March 31, 2020 End of progress report period: April 07, 2020  Today's Date: 04/07/2020 OT Individual Time: 1503-1606 OT Individual Time Calculation (min): 63 min    Patient has met 2 of 4 short term goals.  Pt is consistently performing sit to stand for Stedy transfers with Mod A. Pt able to perform LB dressing for brief/pants with Max A, inconsistent at times with assist for sitting/standing balance. Pt performing UB dress/bathing with Mod A, continues to need frequent multimodal cues to use hemi techniques with little carryover noted. Pt's RUE continues to be flaccid and not able to be used in a functional capacity at this time. Communication continues to be limiting d/t aphasia.   Patient continues to demonstrate the following deficits: muscle weakness, decreased cardiorespiratoy endurance, impaired timing and sequencing, abnormal tone, unbalanced muscle activation, decreased coordination and decreased motor planning, decreased attention to right and decreased motor planning, decreased initiation, decreased attention, decreased awareness, decreased problem solving and decreased safety awareness and decreased sitting balance, decreased standing balance, decreased postural control, hemiplegia and decreased balance strategies and therefore will continue to benefit from skilled OT intervention to enhance overall performance with BADL and Reduce care partner burden.  Patient progressing toward long term goals..  Continue plan of care.  OT Short Term Goals Week 1:  OT Short Term Goal 1 (Week 1): Pt will perform activity for 10 mins with S for sitting balance OT Short Term Goal 1 - Progress (Week 1): Progressing toward goal OT Short Term Goal 2 (Week 1): Pt will perform UB dress with hemidressing with Mod A OT Short Term Goal 2 -  Progress (Week 1): Progressing toward goal OT Short Term Goal 3 (Week 1): Pt will perform sit to stand with LRAD for LB ADLs with Mod of 1 OT Short Term Goal 3 - Progress (Week 1): Met OT Short Term Goal 4 (Week 1): Pt will attend to R side of body consistently during ADL with no more than Mod verbal cues OT Short Term Goal 4 - Progress (Week 1): Met Week 2:  OT Short Term Goal 1 (Week 2): Pt will perform don shirt with hemidressing technique no more than Mod A OT Short Term Goal 2 (Week 2): Pt will perform sit to stand consistently with Min A of 1 in prep for LB ADL OT Short Term Goal 3 (Week 2): Pt will don pants with Mod A with sit to stand to don over hips OT Short Term Goal 4 (Week 2): Pt will perform seated ADLs with CS for sitting balance  Skilled Therapeutic Interventions/Progress Updates:    Pt greeted at time of session supine in bed resting but agreeable to OT session, no clear c/o pain d/t patient has limited communication, at times throughout session pt appeared unsettled/upset and rest breaks provided as needed. Supine to sit Mod A, sit to stand at [facility] with using LUE to pull up on bar, therapist facilitation to support RUE. Stedy transfer 2 helpers for safety to wheelchair, set up at sink level but declined bathing today. Doffed shirt Mod, donned new shirt Max A with hemidressing technique, decreased follow through when told to dress RUE first. Sit to stand at [facility] to doff pants, and donned new pants Max A with pt standing in Stedy to don over hips. Initially trialed standing at sink level with LUE supported  and RLE blocked d/t progress with standing, but pt became very upset, unclear if fearful or another cause. Once dressed, transported via wheelchair to gym and new k-tape applied to R forearm/wrist/digits to decrease any swelling. Stedy transfer back to bed, assist of 1. Sit to supine Mod A, scooted up in bed for first time with using LUE to pull and LLE to push with therapist  assist mod/max of 1. Alarm on, call bell in reach.   Therapy Documentation Precautions:  Precautions Precautions: Fall Precaution Comments: NG, dense R Hemi, trach, aphasic Restrictions Weight Bearing Restrictions: No    Therapy/Group: Individual Therapy  Viona Gilmore 04/07/2020, 5:32 PM

## 2020-04-08 ENCOUNTER — Inpatient Hospital Stay (HOSPITAL_COMMUNITY): Payer: Medicaid Other | Admitting: Speech Pathology

## 2020-04-08 ENCOUNTER — Inpatient Hospital Stay (HOSPITAL_COMMUNITY): Payer: Medicaid Other

## 2020-04-08 ENCOUNTER — Inpatient Hospital Stay (HOSPITAL_COMMUNITY): Payer: Medicaid Other | Admitting: Occupational Therapy

## 2020-04-08 DIAGNOSIS — I63312 Cerebral infarction due to thrombosis of left middle cerebral artery: Secondary | ICD-10-CM | POA: Diagnosis not present

## 2020-04-08 LAB — GLUCOSE, CAPILLARY
Glucose-Capillary: 135 mg/dL — ABNORMAL HIGH (ref 70–99)
Glucose-Capillary: 148 mg/dL — ABNORMAL HIGH (ref 70–99)
Glucose-Capillary: 110 mg/dL — ABNORMAL HIGH (ref 70–99)
Glucose-Capillary: 67 mg/dL — ABNORMAL LOW (ref 70–99)
Glucose-Capillary: 158 mg/dL — ABNORMAL HIGH (ref 70–99)

## 2020-04-08 NOTE — Progress Notes (Signed)
Speech Language Pathology Daily Session Note  Patient Details  Name: Deborah [name] MRN: [id number] Date of Birth: [birth year]  Today's Date: 04/08/2020 SLP Individual Time: 1000-1030 SLP Individual Time Calculation (min): 30 min  Short Term Goals: Week 1: SLP Short Term Goal 1 (Week 1): Patient will tolerate trials of puree PO's with modA for oral clearance and bolus management at oral phase. SLP Short Term Goal 2 (Week 1): Patient will tolerate PMV during all waking hours with SpO2 at least at 95%. SLP Short Term Goal 3 (Week 1): Patient will identify common objects in field of two by pointing with 70% accuracy and modA SLP Short Term Goal 4 (Week 1): Patient will imitate at phoneme and CV (consonant-vowel) word level with 70% accuracy and mod-maximal cues SLP Short Term Goal 5 (Week 1): Patient will utilize basic level communication board to communicate immediate wants/needs with modA  Skilled Therapeutic Interventions:  Patient seen for skilled ST session focusing on dysphagia goals. Patient consumed puree solids without overt difficulty and was able to feed self with SLP holding cup. She consumed trial teaspoon sips of thin liquids and controlled cup sips of thin liquids with immediate cough approximately 30% of the time. Patient continues to benefit from skilled SLP intevention to maximize speech-language, cognitive and swallow function prior to discharge.   Pain Pain Assessment Pain Scale: 0-10 Pain Score: 0-No pain Faces Pain Scale: No hurt  Therapy/Group: Individual Therapy  Angela Nevin, MA, CCC-SLP Speech Therapy

## 2020-04-08 NOTE — Progress Notes (Signed)
[facility] PHYSICAL MEDICINE & REHABILITATION PROGRESS NOTE   Subjective/Complaints: No complaints this morning.  CBGs better controlled More alert and communicative this morning Nursing reports no issues  ROS: Unable to assess due to aphasia.    Objective:   No results found. No results for input(s): WBC, HGB, HCT, PLT in the last 72 hours. No results for input(s): NA, K, CL, CO2, GLUCOSE, BUN, CREATININE, CALCIUM in the last 72 hours. No intake or output data in the 24 hours ending 04/08/20 0928      Physical Exam: Vital Signs Blood pressure 118/67, pulse 87, temperature 98.3 F (36.8 C), resp. rate 17, height 5' (1.524 m), weight 88.4 kg, SpO2 95 %. General: Alert, No apparent distress HEENT: Head is normocephalic, responds to questions with her eyes Neck: Trach in place Heart: Reg rate and rhythm. No murmurs rubs or gallops Chest: CTA bilaterally without wheezes, rales, or rhonchi; no distress Abdomen: Soft, non-tender, non-distended, bowel sounds positive. Extremities: No clubbing, cyanosis, or edema. Pulses are 2+ Skin: Clean and intact without signs of breakdown Neuro:  Comments: Alert Global aphasia Makes eye contact with examiner  Motor: Intact in moving LUE- a few movements of LLE when asked to move RLE RUE has trace movement in biceps, triceps, WE and grip- RLE 0/5.   Psychiatric:     Comments: calm; vocalizing, no verbalizations       Assessment/Plan: 1. Functional deficits secondary to L MCA stroke with aphasia, and R heimplegia  which require 3+ hours per day of interdisciplinary therapy in a comprehensive inpatient rehab setting.  Physiatrist is providing close team supervision and 24 hour management of active medical problems listed below.  Physiatrist and rehab team continue to assess barriers to discharge/monitor patient progress toward functional and medical goals  Care Tool:  Bathing    Body parts bathed by patient: Chest, Abdomen, Right  upper leg, Left upper leg, Face, Right arm, Front perineal area   Body parts bathed by helper: Right upper leg, Left upper leg, Buttocks, Front perineal area, Face, Abdomen, Chest, Left arm, Right arm     Bathing assist Assist Level: Maximal Assistance - Patient 24 - 49%     Upper Body Dressing/Undressing Upper body dressing   What is the patient wearing?: Pull over shirt    Upper body assist Assist Level: Maximal Assistance - Patient 25 - 49%    Lower Body Dressing/Undressing Lower body dressing      What is the patient wearing?: Pants     Lower body assist Assist for lower body dressing: Maximal Assistance - Patient 25 - 49%     Toileting Toileting Toileting Activity did not occur (Clothing management and hygiene only): N/A (no void or bm)  Toileting assist Assist for toileting: 2 Helpers (used stedy)     Transfers Chair/bed transfer  Transfers assist     Chair/bed transfer assist level: Moderate Assistance - Patient 50 - 74%     Locomotion Ambulation   Ambulation assist   Ambulation activity did not occur: Safety/medical concerns (due to patient fatigue/weakness)  Assist level: 2 helpers Assistive device: Other (comment) (wall rail) Max distance: 4   Walk 10 feet activity   Assist  Walk 10 feet activity did not occur: Safety/medical concerns (due to patient fatigue/weakness)        Walk 50 feet activity   Assist Walk 50 feet with 2 turns activity did not occur: Safety/medical concerns (due to patient fatigue/weakness)  Walk 150 feet activity   Assist Walk 150 feet activity did not occur: Safety/medical concerns (due to patient fatigue/weakness)         Walk 10 feet on uneven surface  activity   Assist Walk 10 feet on uneven surfaces activity did not occur: Safety/medical concerns (due to patient fatigue/weakness)         Wheelchair     Assist Will patient use wheelchair at discharge?: Yes Type of Wheelchair:  Manual    Wheelchair assist level: Moderate Assistance - Patient 50 - 74% Max wheelchair distance: 80    Wheelchair 50 feet with 2 turns activity    Assist        Assist Level: Moderate Assistance - Patient 50 - 74%   Wheelchair 150 feet activity     Assist      Assist Level: Dependent - Patient 0%   Blood pressure 118/67, pulse 87, temperature 98.3 F (36.8 C), resp. rate 17, height 5' (1.524 m), weight 88.4 kg, SpO2 95 %.  Medical Problem List and Plan: 1.  Right side hemiparesis with dysphagia secondary to left MCA infarct status post IR with postprocedural hemorrhage, infarct secondary to large vessel disease             -patient may not shower             -ELOS/Goals: Min/mod A/22-25 days         Continue CIR 2.  Antithrombotics: -DVT/anticoagulation: Lovenox             -antiplatelet therapy: Plavix 75 mg daily 3. Pain Management: Tylenol as needed. Well controlled at rest- did report some hip pain when placed in tilt-in-space wheelchair 4. Mood: Provide emotional support             -antipsychotic agents: N/A 5. Neuropsych: This patient is not capable of making decisions on her own behalf. 6. Skin/Wound Care: Routine skin checks 7. Fluids/Electrolytes/Nutrition: Routine in and outs. 8.  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  Currently with a #4 cuffless trach as of 03/28/2020. Continue PMV as tolerated. Follow-up speech therapy. Tolerating room air.  9.  Post-stroke Dysphagia.  NPO. Alternative means of nutritional support.  Follow-up speech therapy  10/14- maintain TFs at regular rate- no boluses yet, until speak with SLP. BUN up to 31- down to 29 on 10/18, need H2O increased 250 mL every 3 hours, has normal echo 10. Uncontrolled diabetes mellitus with hyperglycemia.  Hemoglobin A1c 10.7.  NovoLog 10 units every 4 hours, Lantus insulin 25 units twice daily.   CBG (last 3)  Recent Labs    04/07/20 2351 04/08/20 0406 04/08/20 0750  GLUCAP 130* 135* 148*  Better  controlled with Lantus 24U- no more hypoglycemia  11.  Urinary retention.  Urecholine 10 mg 3 times daily.               Check PVRs  10/14- ideally want Flomax but pt NPO- will con't Urecholine 12.  Hyperlipidemia. Lipitor 13.  Tobacco abuse. Counseled on appropriate 14. Leukocytosis             WBCs 15.6 on 10/13             Afebrile             Chest x-ray showing? Infiltrates, however more likely atelectasis-continue to monitor clinically  10/14- WBC down to 13- con't to monitor   10/15- WBC up slightly to 14.9- down t 12.5 K on 10/16  10/18: WBC trending downward 15. Acute blood  loss anemia: Hgb 11.2 on 10/18 16. Constipation: Had BM 10/19. Had huge BM 10/21. May d/c Miralax.  17. Hypotension: Monitor BP TID. Asymptomatic. Not on anti-hypertensives.   LOS: 9 days A FACE TO FACE EVALUATION WAS PERFORMED  Deborah [name] 04/08/2020, 9:28 AM

## 2020-04-08 NOTE — Progress Notes (Addendum)
Occupational Therapy Session Note  Patient Details  Name: Deborah [name] MRN: [id number] Date of Birth: 01/31/1971  Today's Date: 04/08/2020 OT Individual Time: 5498-2641 OT Individual Time Calculation (min): 43 min   Short Term Goals: Week 2:  OT Short Term Goal 1 (Week 2): Pt will perform don shirt with hemidressing technique no more than Mod A OT Short Term Goal 2 (Week 2): Pt will perform sit to stand consistently with Min A of 1 in prep for LB ADL OT Short Term Goal 3 (Week 2): Pt will don pants with Mod A with sit to stand to don over hips OT Short Term Goal 4 (Week 2): Pt will perform seated ADLs with CS for sitting balance  Skilled Therapeutic Interventions/Progress Updates:    Pt greeted in bed, agreeable to eat breakfast. When asked about pain pt slightly frowned, OT asked if she had head pain and pt responded "yea." When asked if she wanted medicine to help with pain, pt gave the thumbs down. When asked if she wanted lavender for the pillowcase, pt nodded and stated "yea," so this was done for her to manage HA pain. Supine<sit completed with Mod A and noted increased initiation from pt to assist. While EOB, OT placed pts Rt UE in a weightbearing position while pt consumed her breakfast (after PMSV was donned). Pt conveyed food preferences via facial expressions, assistance required for setup and opening condiments. Anterior spillage noted when pt self managed her nectar thickened beverages. Vcs for locating beverages on the Rt side of her tray. Close supervision for sitting balance throughout. Afterwards OT assisted her with hand washing, setup for washing face. Tried to facilitate sit<stand to complete perihygiene due to soiled brief. Pt however verbalizing "Deborah [name]" and "Ow," resistant to sit<stand, adjusting her NG tube frequently. Vcs for scooting up towards [facility] however pt initiated lying down in bed. She was able to boost herself up with bed placed in trendelenburg position and Rt LE  stabilized for pushing up with feet/Lt UE. Pt was left in care of NT for perihygiene/brief change.   02 sats via trach collar 91-95% during session.  Therapy Documentation Precautions:  Precautions Precautions: Fall Precaution Comments: NG, dense R Hemi, trach, aphasic Restrictions Weight Bearing Restrictions: No Pain: Pain Assessment Pain Scale: 0-10 Pain Score: 0-No pain Patients Stated Pain Goal: 3 ADL: ADL Eating: NPO Upper Body Bathing: Moderate assistance, Maximal cueing Where Assessed-Upper Body Bathing: Edge of bed Lower Body Bathing: Dependent Where Assessed-Lower Body Bathing: Edge of bed Upper Body Dressing: Maximal assistance Where Assessed-Upper Body Dressing: Edge of bed Lower Body Dressing: Dependent Toileting: Not assessed      Therapy/Group: Individual Therapy  Shaunta Oncale A Shaquila Sigman 04/08/2020, 12:23 PM

## 2020-04-08 NOTE — Progress Notes (Signed)
Physical Therapy Weekly Progress Note  Patient Details  Name: Deborah [name] MRN: [id number] Date of Birth: 05-31-1971  Beginning of progress report period: March 31, 2020 End of progress report period: April 08, 2020  Today's Date: 04/08/2020 PT Individual Time: 1420-1530 PT Individual Time Calculation (min): 70 min   Patient has met 3 of 3 short term goals.  Patient progressing this week with sitting balance, transfers, initiating gait training and w/c mobility.  She is limited due to fear and anxiety as well as evidently had a migraine this week.  She has good support (significant other and mother both visiting this week) and should continue to progress with skilled PT to work as below.   Patient continues to demonstrate the following deficits muscle weakness, impaired timing and sequencing, abnormal tone, unbalanced muscle activation and decreased coordination, decreased attention to right and decreased motor planning and decreased attention, decreased safety awareness, decreased memory and limited communication and therefore will continue to benefit from skilled PT intervention to increase functional independence with mobility.  Patient progressing toward long term goals..  Continue plan of care.  PT Short Term Goals Week 1:  PT Short Term Goal 1 (Week 1): Patient will maintain static sitting balance with no more than CGA for >3 mins PT Short Term Goal 1 - Progress (Week 1): Met PT Short Term Goal 2 (Week 1): Patient will require no more than MaxA x1 for bed <> wc transfer PT Short Term Goal 2 - Progress (Week 1): Met PT Short Term Goal 3 (Week 1): Patient will initiate gait training as able PT Short Term Goal 3 - Progress (Week 1): Met Week 2:  PT Short Term Goal 1 (Week 2): Patient to propel w/c with S x 70' using hemitechnique PT Short Term Goal 2 (Week 2): Patient to perform static standing with min A x 1 minute for functional ADL's PT Short Term Goal 3 (Week 2): Patient to  ambulate with max A x 10' with LRAD. PT Short Term Goal 4 (Week 2): Patient to initiate stair training.  Skilled Therapeutic Interventions/Progress Updates:    Patient in supine with significant other, [name], in the room visiting.  Reports he has her shoes here.  Assisted to sit EOB mod A for R UE/LE.  Donned pants at EOB with max A.  Transferred to hemi height w/c mod A and donned shoes total A.  Patient assisted to propel w/c x 60' into hallway with max cues for using L LE for steering chair, then pushed to gym.  Sit to stand with wall rail to the L and pt ambulated x 3 steps x 2 then 4 steps with +2 A for balance, R LE progression, stabilization of trunk and LE in stance.  Discussed with significant other possibility to use brace since he asked about ankle rolling over.  Patient sit to stand for static balance holding QC on L UE x 1 minute x 3 trials with mod A for R side stabilization.  Patient performed 2 x 10 reps L LE hamstring curls with blue t-band.  Patient assisted in w/c to ortho gym and performed car transfer with +2 A for safety pivot to L max A and back to w/c with +2 as pt unsafe, holding door and not positioning/pivoting to L safely as well as not positioned on seat for safety despite cues for repositioning.  Patient propelled w/c x 60' again with mod A/cues for L LE use.  Back to room in w/c and left  up with alarm belt and R arm positioned with b/s table due to falling.  Returned later with 1/2 lap tray for proper positioning.  Significant other in the room.   Therapy Documentation Precautions:  Precautions Precautions: Fall Precaution Comments: NG, dense R Hemi, trach, aphasic Restrictions Weight Bearing Restrictions: No Pain: Pain Assessment Faces Pain Scale: No hurt   Therapy/Group: Individual Therapy  Reginia Naas 04/08/2020, 5:57 PM  Magda Kiel, PT

## 2020-04-08 NOTE — Progress Notes (Signed)
Speech Language Pathology Weekly Progress and Session Note  Patient Details  Name: Deborah [name] MRN: [id number] Date of Birth: 25-Dec-1970  Beginning of progress report period: April 01, 2020 End of progress report period: April 08, 2020  Today's Date: 04/08/2020 SLP Individual Time: 0830-0900 SLP Individual Time Calculation (min): 30 min  Short Term Goals: Week 1: SLP Short Term Goal 1 (Week 1): Patient will tolerate trials of puree PO's with modA for oral clearance and bolus management at oral phase. SLP Short Term Goal 1 - Progress (Week 1): Met SLP Short Term Goal 2 (Week 1): Patient will tolerate PMV during all waking hours with SpO2 at least at 95%. SLP Short Term Goal 2 - Progress (Week 1): Met SLP Short Term Goal 3 (Week 1): Patient will identify common objects in field of two by pointing with 70% accuracy and modA SLP Short Term Goal 3 - Progress (Week 1): Met SLP Short Term Goal 4 (Week 1): Patient will imitate at phoneme and CV (consonant-vowel) word level with 70% accuracy and mod-maximal cues SLP Short Term Goal 4 - Progress (Week 1): Progressing toward goal SLP Short Term Goal 5 (Week 1): Patient will utilize basic level communication board to communicate immediate wants/needs with modA SLP Short Term Goal 5 - Progress (Week 1): Met    New Short Term Goals: Week 2: SLP Short Term Goal 1 (Week 2): Patient will be able to imitate at word level with 75% accuracy and modA SLP Short Term Goal 2 (Week 2): Patient will tolerate trials of thin liquids and upgraded solids with minimal overt s/s of aspiration/penetration SLP Short Term Goal 3 (Week 2): Patient will utilize 3-4 choice picture/word communication board to communicate wants/needs with minA SLP Short Term Goal 4 (Week 2): Patient will point to object photos/pictures in field of 4 when named, with 80% accuacy. SLP Short Term Goal 5 (Week 2): Patient will answer basic level yes/no questions verbally or via yes/no  choice board, with 80% accuracy. SLP Short Term Goal 6 (Week 2): Patient will point to identify basic level verb/actions (eat, sleep, etc) in field of two with 80% accuracy.  Weekly Progress Updates:  Patient met 4/5 conservatively set goals and is progressing towards meeting the other goal. She is demonstrating improved alertness and active participation, such as feeding self, etc. She is demonstrating very good progress with pointing to identify objects and pictures in field of two. She is able to imitate at phoneme and word level but this requires significant amount of cues and patient gets frustrated easily.She is on Dys 1 nectar thick liquids and trials of thin liquids have been ongoing with plan for repeat MBS on 10/25   Intensity: Minumum of 1-2 x/day, 30 to 90 minutes Frequency: 3 to 5 out of 7 days Duration/Length of Stay: 11/10 Treatment/Interventions: Cognitive remediation/compensation;Dysphagia/aspiration precaution training;Environmental controls;Cueing hierarchy;Functional tasks;Speech/Language facilitation;Patient/family education;Multimodal communication approach   Daily Session  Skilled Therapeutic Interventions: Patient seen to address speech-language goals. She pointed to object pictures in field of 2 with 80% accuracy and objects in field of two with 90% accuracy after trials. She imitated at word level with mod-maximal A for 4 different object words and approximated 3 others. Patient continues to benefit from skilled SLP intervention to maximize speech-language cognitive and swallow function prior to discharge.    General    Pain Pain Assessment Pain Scale: 0-10 Pain Score: 0-No pain Faces Pain Scale: No hurt  Therapy/Group: Individual Therapy  Sonia Baller, MA, CCC-SLP Speech  Therapy

## 2020-04-09 ENCOUNTER — Inpatient Hospital Stay (HOSPITAL_COMMUNITY): Payer: Medicaid Other | Admitting: Occupational Therapy

## 2020-04-09 ENCOUNTER — Inpatient Hospital Stay (HOSPITAL_COMMUNITY): Payer: Medicaid Other

## 2020-04-09 ENCOUNTER — Inpatient Hospital Stay (HOSPITAL_COMMUNITY): Payer: Medicaid Other | Admitting: Physical Therapy

## 2020-04-09 DIAGNOSIS — K5901 Slow transit constipation: Secondary | ICD-10-CM

## 2020-04-09 DIAGNOSIS — I63512 Cerebral infarction due to unspecified occlusion or stenosis of left middle cerebral artery: Secondary | ICD-10-CM | POA: Diagnosis not present

## 2020-04-09 DIAGNOSIS — G8191 Hemiplegia, unspecified affecting right dominant side: Secondary | ICD-10-CM

## 2020-04-09 DIAGNOSIS — D72829 Elevated white blood cell count, unspecified: Secondary | ICD-10-CM

## 2020-04-09 DIAGNOSIS — E1165 Type 2 diabetes mellitus with hyperglycemia: Secondary | ICD-10-CM

## 2020-04-09 DIAGNOSIS — D62 Acute posthemorrhagic anemia: Secondary | ICD-10-CM

## 2020-04-09 DIAGNOSIS — I63312 Cerebral infarction due to thrombosis of left middle cerebral artery: Secondary | ICD-10-CM | POA: Diagnosis not present

## 2020-04-09 DIAGNOSIS — Z930 Tracheostomy status: Secondary | ICD-10-CM

## 2020-04-09 DIAGNOSIS — R339 Retention of urine, unspecified: Secondary | ICD-10-CM

## 2020-04-09 DIAGNOSIS — I69391 Dysphagia following cerebral infarction: Secondary | ICD-10-CM

## 2020-04-09 LAB — GLUCOSE, CAPILLARY
Glucose-Capillary: 114 mg/dL — ABNORMAL HIGH (ref 70–99)
Glucose-Capillary: 136 mg/dL — ABNORMAL HIGH (ref 70–99)
Glucose-Capillary: 139 mg/dL — ABNORMAL HIGH (ref 70–99)
Glucose-Capillary: 143 mg/dL — ABNORMAL HIGH (ref 70–99)
Glucose-Capillary: 144 mg/dL — ABNORMAL HIGH (ref 70–99)
Glucose-Capillary: 92 mg/dL (ref 70–99)

## 2020-04-09 MED ORDER — TAMSULOSIN HCL 0.4 MG PO CAPS
0.4000 mg | ORAL_CAPSULE | Freq: Every day | ORAL | Status: DC
  Administered 2020-04-09 – 2020-04-21 (×13): 0.4 mg via ORAL
  Filled 2020-04-09 (×13): qty 1

## 2020-04-09 NOTE — Progress Notes (Signed)
Physical Therapy Session Note  Patient Details  Name: Deborah [name] MRN: [id number] Date of Birth: 07/06/1970  Today's Date: 04/09/2020 PT Individual Time: 0092-3300 PT Individual Time Calculation (min): 14 min   and  Today's Date: 04/09/2020 PT Missed Time: 31 Minutes Missed Time Reason: Patient fatigue  Short Term Goals: Week 2:  PT Short Term Goal 1 (Week 2): Patient to propel w/c with S x 70' using hemitechnique PT Short Term Goal 2 (Week 2): Patient to perform static standing with min A x 1 minute for functional ADL's PT Short Term Goal 3 (Week 2): Patient to ambulate with max A x 10' with LRAD. PT Short Term Goal 4 (Week 2): Patient to initiate stair training.  Skilled Therapeutic Interventions/Progress Updates:    Pt received supine in bed with her sister present. Pt remains aphasic with mumbled speech that this therapist is unable to understand. Pt's sister reports pt has not felt well this afternoon hypothesizing that it may be her bowels or the fact that her R LE was positioned awkwardly in the bed; however, NT reports pt had BM this AM and pt's sister had repositioned her R LE but despite this pt's sister believes pt still seems "off" but unable to get further clarification from pt due to aphasia. Therapist used SLP's "yes"/"no" paper to communicate with pt and she declines therapy at this time - upon questioning she denied pain and fatigue - asked questions in varying ways to ensure pt appropriately differentiating between yes/no. Pt's sister inquires about D/C plan with therapist informing her that pt will require 24/7 support at home and that we will recommend follow-up therapy - pt's sister reports she can provide PRN support but that the pt's boyfriend and son will be the primary caregivers and pt's sister is not sure of their plans. Despite encouragement and education on importance of therapy pt not agreeable to participate at this time. Left supine in bed with needs in reach,  lines intact, and bed alarm on. Missed 31 minutes of skilled physical therapy.  Therapy Documentation Precautions:  Precautions Precautions: Fall Precaution Comments: NG, dense R Hemi, trach, aphasic Restrictions Weight Bearing Restrictions: No   Therapy/Group: Individual Therapy  Ginny Forth , PT, DPT, CSRS  04/09/2020, 3:55 PM

## 2020-04-09 NOTE — Progress Notes (Signed)
Speech Language Pathology Daily Session Note  Patient Details  Name: Deborah [name] MRN: [id number] Date of Birth: Oct 25, 1970  Today's Date: 04/09/2020 SLP Individual Time: 2878-6767 SLP Individual Time Calculation (min): 44 min  Short Term Goals: Week 2: SLP Short Term Goal 1 (Week 2): Patient will be able to imitate at word level with 75% accuracy and modA SLP Short Term Goal 2 (Week 2): Patient will tolerate trials of thin liquids and upgraded solids with minimal overt s/s of aspiration/penetration SLP Short Term Goal 3 (Week 2): Patient will utilize 3-4 choice picture/word communication board to communicate wants/needs with minA SLP Short Term Goal 4 (Week 2): Patient will point to object photos/pictures in field of 4 when named, with 80% accuacy. SLP Short Term Goal 5 (Week 2): Patient will answer basic level yes/no questions verbally or via yes/no choice board, with 80% accuracy. SLP Short Term Goal 6 (Week 2): Patient will point to identify basic level verb/actions (eat, sleep, etc) in field of two with 80% accuracy.  Skilled Therapeutic Interventions: Skjilled SLP intervention focused on swallowing and language. Pt imitated 5/15 functional words with max verbal and visual cues. Pt identified objects when given a choice of 2 with 70% accuracy and moderate verbal cues including repetition and visual cues (gestures) for object function. Pt identified simple verbs when presented in picture cards with 65% accuracy. Trials completed with pureed by tsp and pt tolerated with adequate mastication and oral clearance. Voice was clear after all trials.  Pt verbalized "im full" after 6-7 tsp of pureed trials. Cont wit therapy per plan of care.      Pain Pain Assessment Pain Scale: Faces Faces Pain Scale: No hurt  Therapy/Group: Individual Therapy  Amil Amen A Tavaria Mackins 04/09/2020, 11:12 AM

## 2020-04-09 NOTE — Progress Notes (Signed)
Occupational Therapy Session Note  Patient Details  Name: Deborah [name] MRN: [id number] Date of Birth: 05-02-1971  Today's Date: 04/09/2020 OT Individual Time: 1400-1444 OT Individual Time Calculation (min): 44 min   Short Term Goals: Week 2:  OT Short Term Goal 1 (Week 2): Pt will perform don shirt with hemidressing technique no more than Mod A OT Short Term Goal 2 (Week 2): Pt will perform sit to stand consistently with Min A of 1 in prep for LB ADL OT Short Term Goal 3 (Week 2): Pt will don pants with Mod A with sit to stand to don over hips OT Short Term Goal 4 (Week 2): Pt will perform seated ADLs with CS for sitting balance  Skilled Therapeutic Interventions/Progress Updates:    Pt greeted in bed with no s/s pain. Sister, Efrain Sella, present at beside. Abbey asked multiple appropriate questions regarding pts CLOF, CVA recovery, and d/c planning. Efrain Sella was willing to assist during pts therapy session to gain a better understanding of where she was at functionally. Mod A for supine<sit and then pt pivoted over to the bariatric BSC with Mod A via stand pivot, going towards the Lt side. After she transferred, pt cried out and became teary. OT and sister calmed pt and then completed a 2nd sit<stand in order for sister to lower brief. Pt still crying out and tearful until OT placed a trash can under her feet. Discussed with sister aims of timed toileting, pt was very incontinent of bladder in brief. We ran water in the sink to promote continent void, however pt did not void after several minutes of attempting and then requested to transfer off of commode. Mod A for sit<stand without AD, Mod A for balance while sister completed perihygiene in the front and back. Pt began suddenly calling out and crying again so we sat back down. Crying absolved with calming cues, deep breathing, and some seated rest. Mod A for sit<stand to elevate clean brief over hips, and Max A for stand pivot<bed going towards the  affected side. Pt able to complete 1 lateral scoot towards [facility] with Mod A before initiating transfer to supine. Sister Efrain Sella assisted OT with boosting pt up in bed while pt held onto her flaccid Rt UE. She was repositioned for comfort to protect hemiplegic side with HOB raised to increase ease of respiration. 02 sats throughout session 92% and above via trach collar. OT then demonstrated to sister gentle PROM of the Rt hand and ways to increase pt involvement during PROM (I.e. counting aloud during stretches and visually attending to Rt UE). Also encouraged sister to try to use the yes/no communication board with pt as it is still very difficult for pt to convey her wants/needs. Sister very receptive to all education, aware that pt will require 24/7 supervision + additional care at time of d/c. Pt remained in bed at close of session with all needs within reach and bed alarm set, satting at 95%.   Therapy Documentation Precautions:  Precautions Precautions: Fall Precaution Comments: NG, dense R Hemi, trach, aphasic Restrictions Weight Bearing Restrictions: No Vital Signs: Therapy Vitals Temp: 98.8 F (37.1 C) Pulse Rate: 91 Resp: 20 BP: 136/78 Patient Position (if appropriate): Lying Oxygen Therapy SpO2: 96 % O2 Device: Tracheostomy Collar Pain: ?calling out as pain or exhibiting increased feelings of anxiety, difficult to assess due to aphasia and communication deficits. Pt with no s/s pain when resting in bed at end of tx   ADL: ADL Eating: NPO Upper  Body Bathing: Moderate assistance, Maximal cueing Where Assessed-Upper Body Bathing: Edge of bed Lower Body Bathing: Dependent Where Assessed-Lower Body Bathing: Edge of bed Upper Body Dressing: Maximal assistance Where Assessed-Upper Body Dressing: Edge of bed Lower Body Dressing: Dependent Toileting: Not assessed      Therapy/Group: Individual Therapy  Sabrena Gavitt A Lyndal Alamillo 04/09/2020, 4:08 PM

## 2020-04-09 NOTE — Progress Notes (Signed)
[place] PHYSICAL MEDICINE & REHABILITATION PROGRESS NOTE   Subjective/Complaints: Patient seen laying in bed this morning.  No reported issues overnight.  ROS: Unable to assess due to aphasia   Objective:   No results found. No results for input(s): WBC, HGB, HCT, PLT in the last 72 hours. No results for input(s): NA, K, CL, CO2, GLUCOSE, BUN, CREATININE, CALCIUM in the last 72 hours. No intake or output data in the 24 hours ending 04/09/20 0845      Physical Exam: Vital Signs Blood pressure (!) 108/51, pulse 92, temperature 98.6 F (37 C), resp. rate 18, height 5' (1.524 m), weight 88.4 kg, SpO2 96 %. Constitutional: No distress . Vital signs reviewed. HENT: Normocephalic.  Atraumatic.  + NG Neck: + Trach Eyes: EOMI. No discharge. Cardiovascular: No JVD.  RRR. Respiratory: Normal effort.  No stridor.  Bilateral clear to auscultation. GI: Non-distended.  BS +. Skin: Warm and dry.  Intact. Psych: Normal mood.  Normal behavior. Musc: No edema in extremities.  No tenderness in extremities. Neuro:   Alert Global aphasia, unchanged Makes eye contact with examiner  Motor: Limited due to participation, but spontaneously moving left side, no movement noted on right side  Assessment/Plan: 1. Functional deficits secondary to L MCA stroke with aphasia, and R heimplegia  which require 3+ hours per day of interdisciplinary therapy in a comprehensive inpatient rehab setting.  Physiatrist is providing close team supervision and 24 hour management of active medical problems listed below.  Physiatrist and rehab team continue to assess barriers to discharge/monitor patient progress toward functional and medical goals  Care Tool:  Bathing    Body parts bathed by patient: Chest, Abdomen, Right upper leg, Left upper leg, Face, Right arm, Front perineal area   Body parts bathed by helper: Right upper leg, Left upper leg, Buttocks, Front perineal area, Face, Abdomen, Chest, Left arm,  Right arm     Bathing assist Assist Level: Maximal Assistance - Patient 24 - 49%     Upper Body Dressing/Undressing Upper body dressing   What is the patient wearing?: Pull over shirt    Upper body assist Assist Level: Maximal Assistance - Patient 25 - 49%    Lower Body Dressing/Undressing Lower body dressing      What is the patient wearing?: Pants     Lower body assist Assist for lower body dressing: Maximal Assistance - Patient 25 - 49%     Toileting Toileting Toileting Activity did not occur (Clothing management and hygiene only): N/A (no void or bm)  Toileting assist Assist for toileting: 2 Helpers (used stedy)     Transfers Chair/bed transfer  Transfers assist     Chair/bed transfer assist level: Moderate Assistance - Patient 50 - 74%     Locomotion Ambulation   Ambulation assist   Ambulation activity did not occur: Safety/medical concerns (due to patient fatigue/weakness)  Assist level: 2 helpers Assistive device: Other (comment) (wall rail) Max distance: 4   Walk 10 feet activity   Assist  Walk 10 feet activity did not occur: Safety/medical concerns (due to patient fatigue/weakness)        Walk 50 feet activity   Assist Walk 50 feet with 2 turns activity did not occur: Safety/medical concerns (due to patient fatigue/weakness)         Walk 150 feet activity   Assist Walk 150 feet activity did not occur: Safety/medical concerns (due to patient fatigue/weakness)         Walk 10 feet on  uneven surface  activity   Assist Walk 10 feet on uneven surfaces activity did not occur: Safety/medical concerns (due to patient fatigue/weakness)         Wheelchair     Assist Will patient use wheelchair at discharge?: Yes Type of Wheelchair: Manual    Wheelchair assist level: Moderate Assistance - Patient 50 - 74% Max wheelchair distance: 7'    Wheelchair 50 feet with 2 turns activity    Assist        Assist Level:  Moderate Assistance - Patient 50 - 74%   Wheelchair 150 feet activity     Assist      Assist Level: Dependent - Patient 0%   Blood pressure (!) 108/51, pulse 92, temperature 98.6 F (37 C), resp. rate 18, height 5' (1.524 m), weight 88.4 kg, SpO2 96 %.  Medical Problem List and Plan: 1.  Right side hemiparesis with dysphagia secondary to left MCA infarct status post IR with postprocedural hemorrhage, infarct secondary to large vessel disease  Continue CIR 2.  Antithrombotics: -DVT/anticoagulation: Lovenox             -antiplatelet therapy: Plavix 75 mg daily 3. Pain Management: Tylenol as needed.   Controlled with meds on 10/23 4. Mood: Provide emotional support             -antipsychotic agents: N/A 5. Neuropsych: This patient is not capable of making decisions on her own behalf. 6. Skin/Wound Care: Routine skin checks 7. Fluids/Electrolytes/Nutrition: Routine in and outs. 8.  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  Currently with a #4 cuffless trach as of 03/28/2020. Continue PMV as tolerated. Follow-up speech therapy. Tolerating room air.  9.  Post-stroke Dysphagia.    D1 nectars  Continue tube feeds  Freewater flushes increased to 250 mL every 3 hours, has normal echo  Labs ordered for Monday 10. Uncontrolled diabetes mellitus with hyperglycemia.  Hemoglobin A1c 10.7.  NovoLog 10 units every 4 hours, Lantus insulin 24 units twice daily.   CBG (last 3)  Recent Labs    04/09/20 0007 04/09/20 0415 04/09/20 0743  GLUCAP 114* 144* 136*   Slightly labile on 10/23, monitor for trend 11.  Urinary retention.  Urecholine 10 mg 3 times daily.    Flomax started on 10/23            PVRs previously with retention 12.  Hyperlipidemia. Lipitor 13.  Tobacco abuse. Counseled on appropriate 14. Leukocytosis             WBCs 10.8 on 10/18, labs ordered for Monday 15. Acute blood loss anemia:   Hgb 11.2 on 10/18, labs ordered for Monday 16. Constipation:   Improved 17. Hypotension: Relatively  controlled on 10/23.   LOS: 10 days A FACE TO FACE EVALUATION WAS PERFORMED  Deborah [name] 04/09/2020, 8:45 AM

## 2020-04-10 ENCOUNTER — Inpatient Hospital Stay (HOSPITAL_COMMUNITY): Payer: Medicaid Other | Admitting: Occupational Therapy

## 2020-04-10 ENCOUNTER — Inpatient Hospital Stay (HOSPITAL_COMMUNITY): Payer: Medicaid Other | Admitting: Physical Therapy

## 2020-04-10 DIAGNOSIS — G8191 Hemiplegia, unspecified affecting right dominant side: Secondary | ICD-10-CM | POA: Diagnosis not present

## 2020-04-10 DIAGNOSIS — I63312 Cerebral infarction due to thrombosis of left middle cerebral artery: Secondary | ICD-10-CM | POA: Diagnosis not present

## 2020-04-10 DIAGNOSIS — I63512 Cerebral infarction due to unspecified occlusion or stenosis of left middle cerebral artery: Secondary | ICD-10-CM | POA: Diagnosis not present

## 2020-04-10 DIAGNOSIS — D62 Acute posthemorrhagic anemia: Secondary | ICD-10-CM | POA: Diagnosis not present

## 2020-04-10 LAB — GLUCOSE, CAPILLARY
Glucose-Capillary: 124 mg/dL — ABNORMAL HIGH (ref 70–99)
Glucose-Capillary: 127 mg/dL — ABNORMAL HIGH (ref 70–99)
Glucose-Capillary: 129 mg/dL — ABNORMAL HIGH (ref 70–99)
Glucose-Capillary: 129 mg/dL — ABNORMAL HIGH (ref 70–99)
Glucose-Capillary: 131 mg/dL — ABNORMAL HIGH (ref 70–99)
Glucose-Capillary: 144 mg/dL — ABNORMAL HIGH (ref 70–99)

## 2020-04-10 NOTE — Progress Notes (Signed)
[place] PHYSICAL MEDICINE & REHABILITATION PROGRESS NOTE   Subjective/Complaints: Patient seen laying in bed this morning.  No reported issues overnight.  ROS: Unable to assess due to aphasia  Objective:   No results found. No results for input(s): WBC, HGB, HCT, PLT in the last 72 hours. No results for input(s): NA, K, CL, CO2, GLUCOSE, BUN, CREATININE, CALCIUM in the last 72 hours.  Intake/Output Summary (Last 24 hours) at 04/10/2020 0824 Last data filed at 04/09/2020 1838 Gross per 24 hour  Intake 50 ml  Output --  Net 50 ml        Physical Exam: Vital Signs Blood pressure (!) 116/54, pulse 93, temperature 98.2 F (36.8 C), resp. rate 18, height 5' (1.524 m), weight 87.8 kg, SpO2 95 %. Constitutional: No distress . Vital signs reviewed. HENT: Normocephalic.  Atraumatic.  + NG. Neck: + Trach with trach collar. Eyes: EOMI. No discharge. Cardiovascular: No JVD.  RRR. Respiratory: Normal effort.  No stridor.  Bilateral clear to auscultation. GI: Non-distended.  BS +. Skin: Warm and dry.  Intact. Psych: Normal mood.  Normal behavior. Musc: No edema in extremities.  No tenderness in extremities. Neuro: Alert Global aphasia Makes eye contact with examiner  Motor: Limited due to participation, but spontaneously moving left side, no movement noted on right side, unchanged No increase in tone noted  Assessment/Plan: 1. Functional deficits secondary to L MCA stroke with aphasia, and R heimplegia  which require 3+ hours per day of interdisciplinary therapy in a comprehensive inpatient rehab setting.  Physiatrist is providing close team supervision and 24 hour management of active medical problems listed below.  Physiatrist and rehab team continue to assess barriers to discharge/monitor patient progress toward functional and medical goals  Care Tool:  Bathing    Body parts bathed by patient: Chest, Abdomen, Right upper leg, Left upper leg, Face, Right arm, Front  perineal area   Body parts bathed by helper: Right upper leg, Left upper leg, Buttocks, Front perineal area, Face, Abdomen, Chest, Left arm, Right arm     Bathing assist Assist Level: Maximal Assistance - Patient 24 - 49%     Upper Body Dressing/Undressing Upper body dressing   What is the patient wearing?: Pull over shirt    Upper body assist Assist Level: Maximal Assistance - Patient 25 - 49%    Lower Body Dressing/Undressing Lower body dressing      What is the patient wearing?: Pants     Lower body assist Assist for lower body dressing: Maximal Assistance - Patient 25 - 49%     Toileting Toileting Toileting Activity did not occur (Clothing management and hygiene only): N/A (no void or bm)  Toileting assist Assist for toileting: 2 Helpers (used stedy)     Transfers Chair/bed transfer  Transfers assist     Chair/bed transfer assist level: Moderate Assistance - Patient 50 - 74%     Locomotion Ambulation   Ambulation assist   Ambulation activity did not occur: Safety/medical concerns (due to patient fatigue/weakness)  Assist level: 2 helpers Assistive device: Other (comment) (wall rail) Max distance: 4   Walk 10 feet activity   Assist  Walk 10 feet activity did not occur: Safety/medical concerns (due to patient fatigue/weakness)        Walk 50 feet activity   Assist Walk 50 feet with 2 turns activity did not occur: Safety/medical concerns (due to patient fatigue/weakness)         Walk 150 feet activity   Assist  Walk 150 feet activity did not occur: Safety/medical concerns (due to patient fatigue/weakness)         Walk 10 feet on uneven surface  activity   Assist Walk 10 feet on uneven surfaces activity did not occur: Safety/medical concerns (due to patient fatigue/weakness)         Wheelchair     Assist Will patient use wheelchair at discharge?: Yes Type of Wheelchair: Manual    Wheelchair assist level: Moderate  Assistance - Patient 50 - 74% Max wheelchair distance: 28'    Wheelchair 50 feet with 2 turns activity    Assist        Assist Level: Moderate Assistance - Patient 50 - 74%   Wheelchair 150 feet activity     Assist      Assist Level: Dependent - Patient 0%   Blood pressure (!) 116/54, pulse 93, temperature 98.2 F (36.8 C), resp. rate 18, height 5' (1.524 m), weight 87.8 kg, SpO2 95 %.  Medical Problem List and Plan: 1.  Right side hemiparesis with dysphagia secondary to left MCA infarct status post IR with postprocedural hemorrhage, infarct secondary to large vessel disease  Continue CIR 2.  Antithrombotics: -DVT/anticoagulation: Lovenox             -antiplatelet therapy: Plavix 75 mg daily 3. Pain Management: Tylenol as needed.   Appears controlled with meds on 10/24 4. Mood: Provide emotional support             -antipsychotic agents: N/A 5. Neuropsych: This patient is not capable of making decisions on her own behalf. 6. Skin/Wound Care: Routine skin checks 7. Fluids/Electrolytes/Nutrition: Routine in and outs. 8.  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  Currently with a #4 cuffless trach as of 03/28/2020. Continue PMV as tolerated. Follow-up speech therapy. Tolerating room air.  9.  Post-stroke Dysphagia.    D1 nectars  Continue tube feeds  Freewater flushes increased to 250 mL every 3 hours, has normal echo  Labs ordered for tomorrow 10. Uncontrolled diabetes mellitus with hyperglycemia.  Hemoglobin A1c 10.7.  NovoLog 10 units every 4 hours, Lantus insulin 24 units twice daily.   CBG (last 3)  Recent Labs    04/10/20 0027 04/10/20 0433 04/10/20 0747  GLUCAP 131* 124* 127*   Slightly elevated on 10/24, likely to be component as well 11.  Urinary retention.  Urecholine 10 mg 3 times daily.    Flomax started on 10/23            PVRs previously with retention 12.  Hyperlipidemia. Lipitor 13.  Tobacco abuse. Counseled on appropriate 14. Leukocytosis             WBCs  10.8 on 10/18, labs ordered for tomorrow 15. Acute blood loss anemia:   Hgb 11.2 on 10/18, labs ordered for tomorrow 16. Constipation:   Improved 17. Hypotension:   Relatively controlled on 10/24  LOS: 11 days A FACE TO FACE EVALUATION WAS PERFORMED  Sahian Kerney Karis Juba 04/10/2020, 8:24 AM

## 2020-04-10 NOTE — Progress Notes (Signed)
Physical Therapy Session Note  Patient Details  Name: Deborah [name] MRN: [id number] Date of Birth: 08-07-1970  Today's Date: 04/10/2020 PT Individual Time: 0900-0945 PT Individual Time Calculation (min): 45 min   Short Term Goals: Week 1:  PT Short Term Goal 1 (Week 1): Patient will maintain static sitting balance with no more than CGA for >3 mins PT Short Term Goal 1 - Progress (Week 1): Met PT Short Term Goal 2 (Week 1): Patient will require no more than MaxA x1 for bed <> wc transfer PT Short Term Goal 2 - Progress (Week 1): Met PT Short Term Goal 3 (Week 1): Patient will initiate gait training as able PT Short Term Goal 3 - Progress (Week 1): Met  Skilled Therapeutic Interventions/Progress Updates:  Pt was seen bedside in the am. Pt rolled R/L with side rails multiple times with mod A and verbal cues to assist with hygiene and dressing. Pt transferred supine to edge of bed with mod A and verbal cues. Pt transferred edge of bed to w/c with mod A and verbal cues. Pt stood x 3 with mod A and verbal cues. Pt left sitting up in tilt in space chair with chair alarm on and all needs within reach.   Therapy Documentation Precautions:  Precautions Precautions: Fall Precaution Comments: NG, dense R Hemi, trach, aphasic Restrictions Weight Bearing Restrictions: No General:   Pain: Pt c/o headache.   Therapy/Group: Individual Therapy  Dub Amis 04/10/2020, 12:29 PM

## 2020-04-10 NOTE — Progress Notes (Signed)
Occupational Therapy Session Note  Patient Details  Name: Deborah [name] MRN: [id number] Date of Birth: 12/04/1970  Today's Date: 04/10/2020 OT Individual Time: 3220-2542 OT Individual Time Calculation (min): 43 min   Short Term Goals: Week 2:  OT Short Term Goal 1 (Week 2): Pt will perform don shirt with hemidressing technique no more than Mod A OT Short Term Goal 2 (Week 2): Pt will perform sit to stand consistently with Min A of 1 in prep for LB ADL OT Short Term Goal 3 (Week 2): Pt will don pants with Mod A with sit to stand to don over hips OT Short Term Goal 4 (Week 2): Pt will perform seated ADLs with CS for sitting balance  Skilled Therapeutic Interventions/Progress Updates:    Pt greeted while reclined in TIS. Used the yes/no communication board with pt expressing that 1) she did not have pain 2) she felt comfortable sitting in the chair, 3) she did not want to try to use the bathroom 4) she did not want to don clothing or engage in oral care/bathing tasks at the sink. Therefore tx focus was placed on trunk control and Rt NMR with TIS placed in upright neutral position. Started with Rt UE ROM using the UE ranger, pt exhibiting very minimal voluntary movement proximally with gravity minimized, no active movement noted distally. AAROM/PROM without ranger with pt exhibiting the same movement capabilities. She then participated in anterior reaches to target, bending outside of base of support, across midline with Rt UE placed in a weightbearing position. Lateral weight shifts in beat to meaningful music completed with either pt holding her Rt hand or OT holding Rt hand for support, vcs for increasing weight shift towards the Rt side. When pt exhibited some tearfulness, we took rest breaks and chatted, and then pt initiated beginning exercises again, often stating "ok." At end of session pt was reclined in TIS for pressure relief and comfort, still wearing her safety belt. 02 sats throughout  session via trach collar 92% and above.   Therapy Documentation Precautions:  Precautions Precautions: Fall Precaution Comments: NG, dense R Hemi, trach, aphasic Restrictions Weight Bearing Restrictions: No Vital Signs: Therapy Vitals Resp: 18 Patient Position (if appropriate): Lying Oxygen Therapy SpO2: 95 % O2 Device: Tracheostomy Collar FiO2 (%): 21 % ADL: ADL Eating: NPO Upper Body Bathing: Moderate assistance, Maximal cueing Where Assessed-Upper Body Bathing: Edge of bed Lower Body Bathing: Dependent Where Assessed-Lower Body Bathing: Edge of bed Upper Body Dressing: Maximal assistance Where Assessed-Upper Body Dressing: Edge of bed Lower Body Dressing: Dependent Toileting: Not assessed     Therapy/Group: Individual Therapy  [name] A Mercer Stallworth 04/10/2020, 12:37 PM

## 2020-04-11 ENCOUNTER — Inpatient Hospital Stay (HOSPITAL_COMMUNITY): Payer: Medicaid Other

## 2020-04-11 ENCOUNTER — Encounter (HOSPITAL_COMMUNITY): Payer: Medicaid Other

## 2020-04-11 ENCOUNTER — Inpatient Hospital Stay (HOSPITAL_COMMUNITY): Payer: Medicaid Other | Admitting: Occupational Therapy

## 2020-04-11 DIAGNOSIS — I63312 Cerebral infarction due to thrombosis of left middle cerebral artery: Secondary | ICD-10-CM | POA: Diagnosis not present

## 2020-04-11 LAB — CBC WITH DIFFERENTIAL/PLATELET
Abs Immature Granulocytes: 0.08 10*3/uL — ABNORMAL HIGH (ref 0.00–0.07)
Basophils Absolute: 0.1 10*3/uL (ref 0.0–0.1)
Basophils Relative: 1 %
Eosinophils Absolute: 0.5 10*3/uL (ref 0.0–0.5)
Eosinophils Relative: 4 %
HCT: 34.9 % — ABNORMAL LOW (ref 36.0–46.0)
Hemoglobin: 11.3 g/dL — ABNORMAL LOW (ref 12.0–15.0)
Immature Granulocytes: 1 %
Lymphocytes Relative: 24 %
Lymphs Abs: 2.6 10*3/uL (ref 0.7–4.0)
MCH: 32.2 pg (ref 26.0–34.0)
MCHC: 32.4 g/dL (ref 30.0–36.0)
MCV: 99.4 fL (ref 80.0–100.0)
Monocytes Absolute: 0.9 10*3/uL (ref 0.1–1.0)
Monocytes Relative: 8 %
Neutro Abs: 7 10*3/uL (ref 1.7–7.7)
Neutrophils Relative %: 62 %
Platelets: 280 10*3/uL (ref 150–400)
RBC: 3.51 MIL/uL — ABNORMAL LOW (ref 3.87–5.11)
RDW: 14.1 % (ref 11.5–15.5)
WBC: 11.2 10*3/uL — ABNORMAL HIGH (ref 4.0–10.5)
nRBC: 0 % (ref 0.0–0.2)

## 2020-04-11 LAB — URINALYSIS, ROUTINE W REFLEX MICROSCOPIC
Bilirubin Urine: NEGATIVE
Glucose, UA: NEGATIVE mg/dL
Hgb urine dipstick: NEGATIVE
Ketones, ur: NEGATIVE mg/dL
Leukocytes,Ua: NEGATIVE
Nitrite: NEGATIVE
Protein, ur: NEGATIVE mg/dL
Specific Gravity, Urine: 1.015 (ref 1.005–1.030)
pH: 7 (ref 5.0–8.0)

## 2020-04-11 LAB — GLUCOSE, CAPILLARY
Glucose-Capillary: 106 mg/dL — ABNORMAL HIGH (ref 70–99)
Glucose-Capillary: 119 mg/dL — ABNORMAL HIGH (ref 70–99)
Glucose-Capillary: 121 mg/dL — ABNORMAL HIGH (ref 70–99)
Glucose-Capillary: 133 mg/dL — ABNORMAL HIGH (ref 70–99)
Glucose-Capillary: 157 mg/dL — ABNORMAL HIGH (ref 70–99)
Glucose-Capillary: 159 mg/dL — ABNORMAL HIGH (ref 70–99)

## 2020-04-11 LAB — BASIC METABOLIC PANEL
Anion gap: 11 (ref 5–15)
BUN: 24 mg/dL — ABNORMAL HIGH (ref 6–20)
CO2: 24 mmol/L (ref 22–32)
Calcium: 9.4 mg/dL (ref 8.9–10.3)
Chloride: 103 mmol/L (ref 98–111)
Creatinine, Ser: 0.78 mg/dL (ref 0.44–1.00)
GFR, Estimated: >60 mL/min (ref >60)
Glucose, Bld: 157 mg/dL — ABNORMAL HIGH (ref 70–99)
Potassium: 4.3 mmol/L (ref 3.5–5.1)
Sodium: 138 mmol/L (ref 135–145)

## 2020-04-11 MED ORDER — AMANTADINE HCL 100 MG PO CAPS
100.0000 mg | ORAL_CAPSULE | Freq: Every day | ORAL | Status: DC
  Administered 2020-04-11 – 2020-04-14 (×4): 100 mg via ORAL
  Filled 2020-04-11 (×4): qty 1

## 2020-04-11 NOTE — Progress Notes (Signed)
[facility] PHYSICAL MEDICINE & REHABILITATION PROGRESS NOTE   Subjective/Complaints:  Pt nodded head yes to do OK.  Also actually said OK" when told I would see her later- seemed to verbalize better when not facing her, which is common.   Up eating /feeding herself breakfast  ROS: unable to determine due to aphasia.   Objective:   No results found. Recent Labs    04/11/20 0617  WBC 11.2*  HGB 11.3*  HCT 34.9*  PLT 280   Recent Labs    04/11/20 0617  NA 138  K 4.3  CL 103  CO2 24  GLUCOSE 157*  BUN 24*  CREATININE 0.78  CALCIUM 9.4    Intake/Output Summary (Last 24 hours) at 04/11/2020 1141 Last data filed at 04/11/2020 0800 Gross per 24 hour  Intake 120 ml  Output --  Net 120 ml        Physical Exam: Vital Signs Blood pressure (!) 105/51, pulse 76, temperature 98.3 F (36.8 C), temperature source Oral, resp. rate 18, height 5' (1.524 m), weight 89.1 kg, SpO2 95 %. Constitutional: No distress . Vital signs reviewed. Awake, alert, up in w/c feeding self with fork, NAD HENT: Normocephalic.  Atraumatic.  + NG. Neck: + Trach with trach collar.- no change Eyes: EOMI. No discharge. Cardiovascular: RRR Respiratory: CTA B/L- no W/R/R- good air movement GI: Soft, NT, ND, (+)BS -hyperactive due to eating Skin: Warm and dry.  Intact. Psych: appropriate/calm Musc: No edema in extremities.  No tenderness in extremities. Neuro: Alert Severe aphasia- did say "OK".  Makes eye contact with examiner  Motor: Limited due to participation, but spontaneously moving left side, no movement noted on right side, unchanged No increase in tone noted  Assessment/Plan: 1. Functional deficits secondary to L MCA stroke with aphasia, and R heimplegia  which require 3+ hours per day of interdisciplinary therapy in a comprehensive inpatient rehab setting.  Physiatrist is providing close team supervision and 24 hour management of active medical problems listed below.  Physiatrist  and rehab team continue to assess barriers to discharge/monitor patient progress toward functional and medical goals  Care Tool:  Bathing    Body parts bathed by patient: Chest, Abdomen, Right upper leg, Left upper leg, Face, Right arm, Front perineal area   Body parts bathed by helper: Right upper leg, Left upper leg, Buttocks, Front perineal area, Face, Abdomen, Chest, Left arm, Right arm     Bathing assist Assist Level: Maximal Assistance - Patient 24 - 49%     Upper Body Dressing/Undressing Upper body dressing   What is the patient wearing?: Pull over shirt    Upper body assist Assist Level: Maximal Assistance - Patient 25 - 49%    Lower Body Dressing/Undressing Lower body dressing      What is the patient wearing?: Underwear/pull up     Lower body assist Assist for lower body dressing: Dependent - Patient 0%     Toileting Toileting Toileting Activity did not occur (Clothing management and hygiene only): N/A (no void or bm)  Toileting assist Assist for toileting: 2 Helpers (used stedy)     Transfers Chair/bed transfer  Transfers assist     Chair/bed transfer assist level: Moderate Assistance - Patient 50 - 74%     Locomotion Ambulation   Ambulation assist   Ambulation activity did not occur: Safety/medical concerns (due to patient fatigue/weakness)  Assist level: 2 helpers Assistive device: Other (comment) (wall rail) Max distance: 4   Walk 10 feet activity  Assist  Walk 10 feet activity did not occur: Safety/medical concerns (due to patient fatigue/weakness)        Walk 50 feet activity   Assist Walk 50 feet with 2 turns activity did not occur: Safety/medical concerns (due to patient fatigue/weakness)         Walk 150 feet activity   Assist Walk 150 feet activity did not occur: Safety/medical concerns (due to patient fatigue/weakness)         Walk 10 feet on uneven surface  activity   Assist Walk 10 feet on uneven surfaces  activity did not occur: Safety/medical concerns (due to patient fatigue/weakness)         Wheelchair     Assist Will patient use wheelchair at discharge?: Yes Type of Wheelchair: Manual    Wheelchair assist level: Moderate Assistance - Patient 50 - 74% Max wheelchair distance: 34'    Wheelchair 50 feet with 2 turns activity    Assist        Assist Level: Moderate Assistance - Patient 50 - 74%   Wheelchair 150 feet activity     Assist      Assist Level: Dependent - Patient 0%   Blood pressure (!) 105/51, pulse 76, temperature 98.3 F (36.8 C), temperature source Oral, resp. rate 18, height 5' (1.524 m), weight 89.1 kg, SpO2 95 %.  Medical Problem List and Plan: 1.  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  Antithrombotics: -DVT/anticoagulation: Lovenox             -antiplatelet therapy: Plavix 75 mg daily 3. Pain Management: Tylenol as needed.   10/25- controlled- con't regimen 4. Mood: Provide emotional support             -antipsychotic agents: N/A 5. Neuropsych: This patient is not capable of making decisions on her own behalf. 6. Skin/Wound Care: Routine skin checks 7. Fluids/Electrolytes/Nutrition: Routine in and outs. 8.  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  Currently with a #4 cuffless trach as of 03/28/2020. Continue PMV as tolerated. Follow-up speech therapy. Tolerating room air.  9.  Post-stroke Dysphagia.    D1 nectars  Continue tube feeds  Freewater flushes increased to 250 mL every 3 hours, has normal echo  Labs ordered for tomorrow  10/25- BUN 24- slightly elevated-  10. Uncontrolled diabetes mellitus with hyperglycemia.  Hemoglobin A1c 10.7.  NovoLog 10 units every 4 hours, Lantus insulin 24 units twice daily.   CBG (last 3)   Recent Labs    04/11/20 0421 04/11/20 0758 04/11/20 1106  GLUCAP 159* 119* 157*   Slightly elevated on 10/24, likely to be component as well  10/25- BGs 119-160- con't regimen for now 11.  Urinary retention.  Urecholine 10 mg 3 times daily.    Flomax started on 10/23            PVRs previously with retention 12.  Hyperlipidemia. Lipitor 13.  Tobacco abuse. Counseled on appropriate 14. Leukocytosis             WBCs 10.8 on 10/18, labs ordered for tomorrow  10/25- WBC 11.2- will check U/A and Cx to make sure doesn't have UTI 15. Acute blood loss anemia:   Hgb 11.2 on 10/18, labs ordered for tomorrow 16. Constipation:   Improved 17. Hypotension:   10/25- BP controlled-  con't regimen  LOS: 12 days A FACE TO FACE EVALUATION WAS PERFORMED  Bettejane Leavens 04/11/2020, 11:41 AM

## 2020-04-11 NOTE — Progress Notes (Signed)
Physical Therapy Session Note  Patient Details  Name: Deborah [name] MRN: [id number] Date of Birth: May 06, 1971  Today's Date: 04/11/2020 PT Individual Time: 1300-1400 PT Individual Time Calculation (min): 60 min   Short Term Goals: Week 2:  PT Short Term Goal 1 (Week 2): Patient to propel w/c with S x 70' using hemitechnique PT Short Term Goal 2 (Week 2): Patient to perform static standing with min A x 1 minute for functional ADL's PT Short Term Goal 3 (Week 2): Patient to ambulate with max A x 10' with LRAD. PT Short Term Goal 4 (Week 2): Patient to initiate stair training.  Skilled Therapeutic Interventions/Progress Updates:    Patient in supine indicated she went for swallow test.  Patient supine to sit with HOB elevated with mod A for R side scooting and R UE management.  Seated EOB total A to don pants.  Patient sit to stand mod A.  Transfer to w/c mod A to L side mod cues for technique, slowing down and safety.  Pushed in chair to dayroom for energy conservation and time management.  Patient transferred to standard chair mod A to perform Kinetron seated with min A for R LE positioning @ 60 cm/sec x 2 x 30 reps.  Patient transferred to w/c mod A and performed sit to stand at wall rail mod A.  Ambulated 2 x 4' then 1 x 8' +2 A for w/c follow and max A for balance, R LE progression during swing and stabilization during stance and max cues for safety to wait till R LE positioned prior to taking step and to wait for w/c prior to sitting.  Patient assisted in w/c to general gym.  Transferred to mat mod A.  Sitting balance activity reaching for horse shoes on R side and tossing with L UE with min A for balance.  Patient assisted into w/c and back to room.  Left in w/c with chair alarm activated and all needs in reach.   Therapy Documentation Precautions:  Precautions Precautions: Fall Precaution Comments: NG, dense R Hemi, trach, aphasic Restrictions Weight Bearing Restrictions:  No Pain: Pain Assessment Pain Scale: Faces Faces Pain Scale: Hurts little more Pain Type: Acute pain Pain Location: Head Pain Orientation: Right Pain Descriptors / Indicators: Discomfort;Aching Pain Onset: On-going Pain Intervention(s): Rest    Therapy/Group: Individual Therapy  [name]  [place], PT 04/11/2020, 1:54 PM

## 2020-04-11 NOTE — Progress Notes (Signed)
Modified Barium Swallow Progress Note  Patient Details  Name: Deborah [name] MRN: [id number] Date of Birth: 03/13/1971  Today's Date: 04/11/2020  Modified Barium Swallow completed.  Full report located under Chart Review in the Imaging Section.  Brief recommendations include the following:  Clinical Impression    Pt presents with primarily moderate oral dysphagia marked by reduced bolus cohesion, reduced lingual manipulation, premature spillage and mild delay in swallow initiation. Pt had PMSV in place during today's study. Oral phase was characterized by anterior right spillage of thin liquids, and nectar thick liquids. Bolus was better containment with straw versus cup sips due to poor labial seal. Pt demonstrated difficulty accepting dys 2 textures into oral cavity with the majority spilling anteriorly. Swallow initiation for thin liquids was at the pyriform sinuses and occasionally in between the valleculae and the pyriforms. This is a slight improvement in swallow initiation compare to previous MBS noting initiation only at the pyriform sinuses for thin liquids. Swallow initiation was at the vallecular level for nectar thick liquids and solid textures.  Penetration (PAS 2) occurred several times with thin liquids, likely due to premature spillage and mild delay in swallow initiation but was ultimately cleared during the swallow. Pt demonstrated adequate hyolaryngeal elevation/excursion, epiglottic inversion, laryngeal vestibule closure and pharyngeal peristalsis. No aspiration noted on today's swallow study, however pt only consumed small boluses despite max A verbal cues, therefore unable to observe large consecutive sips of thin liquids.  SLP recommends full supervision diet of thin liquids and puree textures, medication whole in puree (placed on left side of oral cavity) with PMSV in place during PO consumption. SLP can advance solid textures at bedside with improvement of oral control.     Swallow Evaluation Recommendations       SLP Diet Recommendations: Dysphagia 1 (Puree) solids;Thin liquid   Liquid Administration via: Cup;Straw   Medication Administration: Whole meds with liquid (whole or crushed)   Supervision: Staff to assist with self feeding;Full supervision/cueing for compensatory strategies   Compensations: Minimize environmental distractions;Slow rate;Small sips/bites   Postural Changes: Seated upright at 90 degrees   Oral Care Recommendations: Oral care BID   Other Recommendations: Have oral suction available;Place PMSV during PO intake    Liz Pinho 04/11/2020,4:09 PM

## 2020-04-11 NOTE — Progress Notes (Signed)
Nutrition Follow-up  DOCUMENTATION CODES:   Obesity unspecified  INTERVENTION:   Continue nocturnal tube feeding via Cortrak: - Glucerna 1.5 @ 85 ml/hr to run from 1800 to 0600 (total of 1020 ml) - ProSource TF 45 ml daily - Continue free water flushes of 250 ml q 3 hours while awake (order per MD)  Nocturnal tube feeding regimen provides1570kcal, 95grams of protein, and 766m of H2O (87% of kcal needs, 100% of protein needs).  Total free water with flushes: 2274 ml  - Continue MVI with minerals daily  - Magic cup BID with meals, each supplement provides 290 kcal and 9 grams of protein  NUTRITION DIAGNOSIS:   Inadequate oral intake related to dysphagia as evidenced by NPO status.  Progressing, pt now on dysphagia 1 diet with nectar-thick liquids  GOAL:   Patient will meet greater than or equal to 90% of their needs  Progressing  MONITOR:   PO intake, Diet advancement, Labs, Weight trends, TF tolerance, Skin, I & O's  REASON FOR ASSESSMENT:   Consult Enteral/tube feeding initiation and management  ASSESSMENT:   49year old female with PMH of DM, HLD, and tobacco abuse. Presented on 03/08/20 with right-sided hemiparesis and aphasia. Pt was found to have left MCA CVA. Pt underwent thrombectomy per IR. Pt required intubation and underwent tracheostomy on 03/13/20. Pt is currently NPO with Cortrak in place. Admitted to CIR on 10/13.  10/15 - diet advanced to dysphagia 1 with nectar-thick liquids  Noted target d/c date of 11/10.  Met with pt at bedside. Pt in wheelchair resting and watching TV. No complaints at time of visit. Pt indicated that she was not experiencing any abdominal pain or nausea. Pt remains on supplemental nocturnal tube feeds due to variable PO intake. Pt indicated that she did eat some lunch today. Meal completion for lunch recorded as 40%.  Current weight: 89.1 kg Admit weight: 90.1 kg  Current TF: Glucerna 1.5 @ 85 ml/hr x 12 hours, ProSource  TF 45 ml daily, free water 250 ml q 3 hours while awake  Meal Completion: 10-50% x last 8 documented meals  Medications reviewed and include: colace, SSI q 4 hours, Novolog 10 units q 4 hours, lantus 24 units BID, MVI with minerals  Labs reviewed: BUN 24 (trending down) CBG's: 119-159 x 24 hours  Diet Order:   Diet Order            DIET - DYS 1 Room service appropriate? No; Fluid consistency: Nectar Thick  Diet effective now                 EDUCATION NEEDS:   No education needs have been identified at this time  Skin:  Skin Assessment: Reviewed RN Assessment  Last BM:  04/11/20 small type 6  Height:   Ht Readings from Last 1 Encounters:  03/31/20 5' (1.524 m)    Weight:   Wt Readings from Last 1 Encounters:  04/11/20 89.1 kg    BMI:  Body mass index is 38.36 kg/m.  Estimated Nutritional Needs:   Kcal:  1800-2000  Protein:  95-110 grams  Fluid:  >/= 1.8 L    KGaynell Face MS, RD, LDN Inpatient Clinical Dietitian Please see [facility] for contact information.

## 2020-04-11 NOTE — Progress Notes (Signed)
Occupational Therapy Session Note  Patient Details  Name: Deborah [name] MRN: [id number] Date of Birth: 04-13-1971  Today's Date: 04/11/2020 OT Individual Time: 4818-5631 and 4970-2637 OT Individual Time Calculation (min): 43 min and 59 min   Short Term Goals: Week 2:  OT Short Term Goal 1 (Week 2): Pt will perform don shirt with hemidressing technique no more than Mod A OT Short Term Goal 2 (Week 2): Pt will perform sit to stand consistently with Min A of 1 in prep for LB ADL OT Short Term Goal 3 (Week 2): Pt will don pants with Mod A with sit to stand to don over hips OT Short Term Goal 4 (Week 2): Pt will perform seated ADLs with CS for sitting balance  Skilled Therapeutic Interventions/Progress Updates:    Pt greeted at time of session semi-reclined in bed, agreeable to OT session with thumbs up/down communication. Provided with yes/no communication board but pt continues to use thumbs up/down. Pt did not want to perform bathing, change gown/clothes/or brush teeth, but she did want to get up to eat breakfast. Supine to sit Mod, sit to stand at [employer], Stedy transfer to wheelchair with 2nd helper as stanby assist. When in standing, pt had soiled brief with BM and urine, incontinent episode. With pt static standing in [place], doffed soiled brief in standing with total A, donned new brief with Max/total A with pt in static standing. Dependent for hygiene for BM. Pt up in wheelchair eating breakfast with supervision A, left with NT to finish eating as she is full supervision to finish eating.   Session 2: Pt greeted at time of session sitting up in standard wheelchair agreeable to OT session with thumbs up. Noted to have large knot in hair, agreeable to grooming/brushing at sink level. Therapist assist with first getting out large tangle, pt performed grooming with hair brush through majority of her hair, needing assist with back, and therapist assist to braid. Dependent transfer to gym, set  up perpendicular to parallel bars and pt performed sit to stand Mod A with R knee block, static stand with Min-Mod A for standing balance for approx 20-30 seconds, upon descent into chair pt expressed she was in pain but unclear location/severity d/t communication deficit. Attempted to have pt give thumbs up/down when pointing to location of pain, which subsided after a short time. Noted to have begun mild shoulder sublux, k-tape applied to R shoulder to support joint. AAROM for shoulder, elbow, wrist with instructions for self ROM, moderate carryover noted needing reinforcement. Upon return to her room, pt very adamant to get back to bed refusing to sit up in chair any longer. Sit to stand from low standard wheelchair Max A to [place], transferred back to bed and sit to supine Min/Mod A to manage RLE. Positioned with RUE elevated, call bell in reach and alarm on.   Therapy Documentation Precautions:  Precautions Precautions: Fall Precaution Comments: NG, dense R Hemi, trach, aphasic Restrictions Weight Bearing Restrictions: No     Therapy/Group: Individual Therapy  Erasmo Score 04/11/2020, 8:10 AM

## 2020-04-12 ENCOUNTER — Inpatient Hospital Stay (HOSPITAL_COMMUNITY): Payer: Medicaid Other | Admitting: Occupational Therapy

## 2020-04-12 ENCOUNTER — Inpatient Hospital Stay (HOSPITAL_COMMUNITY): Payer: Medicaid Other | Admitting: Speech Pathology

## 2020-04-12 ENCOUNTER — Inpatient Hospital Stay (HOSPITAL_COMMUNITY): Payer: Medicaid Other

## 2020-04-12 DIAGNOSIS — I63312 Cerebral infarction due to thrombosis of left middle cerebral artery: Secondary | ICD-10-CM | POA: Diagnosis not present

## 2020-04-12 LAB — GLUCOSE, CAPILLARY
Glucose-Capillary: 126 mg/dL — ABNORMAL HIGH (ref 70–99)
Glucose-Capillary: 128 mg/dL — ABNORMAL HIGH (ref 70–99)
Glucose-Capillary: 135 mg/dL — ABNORMAL HIGH (ref 70–99)
Glucose-Capillary: 137 mg/dL — ABNORMAL HIGH (ref 70–99)
Glucose-Capillary: 149 mg/dL — ABNORMAL HIGH (ref 70–99)
Glucose-Capillary: 185 mg/dL — ABNORMAL HIGH (ref 70–99)
Glucose-Capillary: 78 mg/dL (ref 70–99)

## 2020-04-12 LAB — URINE CULTURE: Culture: NO GROWTH

## 2020-04-12 NOTE — Progress Notes (Signed)
Patient ID: Deborah [name], female   DOB: 12-Dec-1970, 49 y.o.   MRN: [id number] Team Conference Report to Patient/Family  Team Conference discussion was reviewed with the patient and caregiver, including goals, any changes in plan of care and target discharge date.  Patient and caregiver express understanding and are in agreement.  The patient has a target discharge date of 04/27/20.  Andria Rhein 04/12/2020, 1:08 PM

## 2020-04-12 NOTE — Progress Notes (Signed)
Speech Language Pathology Daily Session Note  Patient Details  Name: Deborah [name] MRN: [id number] Date of Birth: December 27, 1970  Today's Date: 04/12/2020 SLP Individual Time: 1000-1057 SLP Individual Time Calculation (min): 57 min  Short Term Goals: Week 2: SLP Short Term Goal 1 (Week 2): Patient will be able to imitate at word level with 75% accuracy and modA SLP Short Term Goal 2 (Week 2): Patient will tolerate trials of thin liquids and upgraded solids with minimal overt s/s of aspiration/penetration SLP Short Term Goal 3 (Week 2): Patient will utilize 3-4 choice picture/word communication board to communicate wants/needs with minA SLP Short Term Goal 4 (Week 2): Patient will point to object photos/pictures in field of 4 when named, with 80% accuacy. SLP Short Term Goal 5 (Week 2): Patient will answer basic level yes/no questions verbally or via yes/no choice board, with 80% accuracy. SLP Short Term Goal 6 (Week 2): Patient will point to identify basic level verb/actions (eat, sleep, etc) in field of two with 80% accuracy.  Skilled Therapeutic Interventions: Pt was seen for skilled ST targeting speech language and swallow goals. PMSV was donned at baseline and throughout session without s/sx respiratory distress or appreciable change in vitals. Upon arrival pt very visibly upset, moaning/yelling and gesturing toward bed. Pt communicated through yes/no gestures and pointing that she would not participate until moved to bed. Therefore, SLP and NT transferred pt to bed via stedy. Once upright, pt completed oral care via suction brush with Min A verbal cues for thoroughness and set up assist. She accepted very minimal solid POs - pudding was consumed with overall weak manipulation and trace lingual residue, Dys 2 minced peaches required Mod A verbal cueing from clinician to assist with bolus acceptance into oral cavity due to reduced labial seal and weak manipulation. Mastication was prolonged but  clearance was efficient with multiple swallows. Pt also consumed thin H2O via straw, with 1 immediate cough noted across ~12 sips. Recommend pt continue current diet. During structured speech and language tasks, pt pointed to identify objects from photographs from field of 2 or 4 with overall 29/40 accuracy. Although unable to generate most names of objects, she repeated object names at the word level with ~75% accuracy. Pt left laying in bed with alarm set and needs within reach. Continue per current plan of care.          Pain Pain Assessment Pain Scale: Faces Faces Pain Scale: No hurt Patients Stated Pain Goal: 0  Therapy/Group: Individual Therapy  Little Ishikawa 04/12/2020, 11:34 AM

## 2020-04-12 NOTE — Progress Notes (Signed)
Physical Therapy Session Note  Patient Details  Name: Deborah [name] MRN: [id number] Date of Birth: 12-18-1970  Today's Date: 04/12/2020 PT Individual Time: 1345-1450 PT Individual Time Calculation (min): 65 min   Short Term Goals: Week 2:  PT Short Term Goal 1 (Week 2): Patient to propel w/c with S x 70' using hemitechnique PT Short Term Goal 2 (Week 2): Patient to perform static standing with min A x 1 minute for functional ADL's PT Short Term Goal 3 (Week 2): Patient to ambulate with max A x 10' with LRAD. PT Short Term Goal 4 (Week 2): Patient to initiate stair training.  Skilled Therapeutic Interventions/Progress Updates: Patient supine and agreeable to PT.  Assist to sit up EOB with mod A for R LE, scooting.  Patient noted to be soiled in brief, sat EOB with alarm while PT obtained brief.  Patient sit to stand mod A and min A for balance while assisted with hygiene and placing new brief.  Assist to don pants max A and total A for shoes seated EOB.  Patient transfer to w/c mod A.  Pushed for energy conservation and time management to gym.  Placed ace wrap on R LE for dorsiflexion assist, then Sit to stand in parallel bars, pt took one step then LOB and assist to lower to w/c.  Patient encouraged due to fear with LOB.  Sit to stand x 3 more reps for R LE weight shift with max A for supporting leg and L foot stepping forward and back x 3-5 reps.  Patient transfer to mat mod A.  Seated balance activity reaching and tossing bean bags to tic tac toe game.  Patient assisted to pivot toward R back to w/c mod A and increased time max cues for technique pt c/o fear.  Patient assisted to room in w/c and left up with alarm belt on and call bell in reach.  NT aware pt will want back to bed in 30 minutes.      Therapy Documentation Precautions:  Precautions Precautions: Fall Precaution Comments: NG, dense R Hemi, trach, aphasic Restrictions Weight Bearing Restrictions: No Pain: Pain  Assessment Faces Pain Scale: No hurt    Therapy/Group: Individual Therapy  [name]  [name], PT 04/12/2020, 5:57 PM

## 2020-04-12 NOTE — Progress Notes (Signed)
Occupational Therapy Session Note  Patient Details  Name: Deborah [name] MRN: [id number] Date of Birth: 1971/05/04  Today's Date: 04/12/2020 OT Individual Time: 6720-9470 OT Individual Time Calculation (min): 73 min    Short Term Goals: Week 2:  OT Short Term Goal 1 (Week 2): Pt will perform don shirt with hemidressing technique no more than Mod A OT Short Term Goal 2 (Week 2): Pt will perform sit to stand consistently with Min A of 1 in prep for LB ADL OT Short Term Goal 3 (Week 2): Pt will don pants with Mod A with sit to stand to don over hips OT Short Term Goal 4 (Week 2): Pt will perform seated ADLs with CS for sitting balance   Skilled Therapeutic Interventions/Progress Updates:    Pt greeted at time of session reclined in bed just waking up, agreeable to OT session with body language and hand gestures. Supine to sit Mod A to manage RLE but pt able to manage trunk, assist to scoot to EOB. Sit to stand at [facility] A from bed surface, noted to have brief soiled with urine. Doffed brief dependently, sit to stands at [facility] level for hygiene to wash buttocks but pt able to use LUE to wash periarea in front, therapist assist for thoroughness. Note that sitting balance/core has improved as she was able to be in [place] (with BLEs blocked at knees) with supervision to wash periarea. Stedy transfer to TIS (standard wheelchair too low to reach sink for bathing) and set up at sink, UB bathing Mod A with frequent cues for sequencing. LB bathing Max A pt washing thighs and past knee level on LLE, assist with RLE, assist with washing buttocks as well. Donned pants Max A with assist to thread, sit to stand at sink level Mod and with R knee block pt assisted donning over hips with LUE. Donned gown Mod A, unable to locate nurse to detatch IV to don shirt. Pt set up for eating breakfast, performed with Supervision. Up in wheelchair with alarm on, call bell in reach, RN aware to help pt back to bed after ST  session to conserve energy for PT session later in the day.    Therapy Documentation Precautions:  Precautions Precautions: Fall Precaution Comments: NG, dense R Hemi, trach, aphasic Restrictions Weight Bearing Restrictions: No    Therapy/Group: Individual Therapy  Erasmo Score 04/12/2020, 8:26 AM

## 2020-04-12 NOTE — Progress Notes (Signed)
[place] PHYSICAL MEDICINE & REHABILITATION PROGRESS NOTE   Subjective/Complaints:  Pt nodding head yes and speaking garbled- cannot understand, but appears calm.   R hand taped due to swelling- better now per OT_ and also taped R shoulder due to wincing in pain somewhere in RUE.    Foam on buttocks for prevention, no wounds.   ROS: cannot determine due to aphasia   Objective:   No results found. Recent Labs    04/11/20 0617  WBC 11.2*  HGB 11.3*  HCT 34.9*  PLT 280   Recent Labs    04/11/20 0617  NA 138  K 4.3  CL 103  CO2 24  GLUCOSE 157*  BUN 24*  CREATININE 0.78  CALCIUM 9.4    Intake/Output Summary (Last 24 hours) at 04/12/2020 1029 Last data filed at 04/11/2020 1756 Gross per 24 hour  Intake 15 ml  Output --  Net 15 ml        Physical Exam: Vital Signs Blood pressure 120/69, pulse 88, temperature 98.4 F (36.9 C), temperature source Oral, resp. rate 18, height 5' (1.524 m), weight 90.6 kg, SpO2 94 %. Constitutional: No distress . Vital signs reviewed. Awake, alert, being transferred/stood with steady, OT in room, NAD HENT: Normocephalic.  Atraumatic.  + NG. Neck: + Trach with trach collar.- no change Eyes: EOMI. No discharge. Cardiovascular: RRR Respiratory: CTA B/L- no W/R/R- good air movement GI: Soft, NT, ND, (+)BS  Skin: Warm and dry.  Intact.no skin breakdown on bottom- peeled back foam to look Psych: appropriate/calm- speaking garbled- cannot understand today at all Musc: No edema in extremities.  No tenderness in extremities. Neuro: Alert Severe aphasia- can understand today Makes eye contact with examiner  Motor: Limited due to participation, but spontaneously moving left side, no movement noted on right side, unchanged No increase in tone noted  Assessment/Plan: 1. Functional deficits secondary to L MCA stroke with aphasia, and R heimplegia  which require 3+ hours per day of interdisciplinary therapy in a comprehensive inpatient  rehab setting.  Physiatrist is providing close team supervision and 24 hour management of active medical problems listed below.  Physiatrist and rehab team continue to assess barriers to discharge/monitor patient progress toward functional and medical goals  Care Tool:  Bathing    Body parts bathed by patient: Chest, Abdomen, Right upper leg, Left upper leg, Face, Right arm, Front perineal area   Body parts bathed by helper: Buttocks, Left arm, Left lower leg, Right lower leg     Bathing assist Assist Level: Maximal Assistance - Patient 24 - 49%     Upper Body Dressing/Undressing Upper body dressing   What is the patient wearing?: Pull over shirt    Upper body assist Assist Level: Maximal Assistance - Patient 25 - 49%    Lower Body Dressing/Undressing Lower body dressing      What is the patient wearing?: Underwear/pull up, Pants     Lower body assist Assist for lower body dressing: Maximal Assistance - Patient 25 - 49%     Toileting Toileting Toileting Activity did not occur (Clothing management and hygiene only): N/A (no void or bm)  Toileting assist Assist for toileting: 2 Helpers (used stedy)     Transfers Chair/bed transfer  Transfers assist     Chair/bed transfer assist level: Moderate Assistance - Patient 50 - 74%     Locomotion Ambulation   Ambulation assist   Ambulation activity did not occur: Safety/medical concerns (due to patient fatigue/weakness)  Assist level:  2 helpers Assistive device: Other (comment) (wall rail) Max distance: 8   Walk 10 feet activity   Assist  Walk 10 feet activity did not occur: Safety/medical concerns (due to patient fatigue/weakness)        Walk 50 feet activity   Assist Walk 50 feet with 2 turns activity did not occur: Safety/medical concerns (due to patient fatigue/weakness)         Walk 150 feet activity   Assist Walk 150 feet activity did not occur: Safety/medical concerns (due to patient  fatigue/weakness)         Walk 10 feet on uneven surface  activity   Assist Walk 10 feet on uneven surfaces activity did not occur: Safety/medical concerns (due to patient fatigue/weakness)         Wheelchair     Assist Will patient use wheelchair at discharge?: Yes Type of Wheelchair: Manual    Wheelchair assist level: Moderate Assistance - Patient 50 - 74% Max wheelchair distance: 59'    Wheelchair 50 feet with 2 turns activity    Assist        Assist Level: Moderate Assistance - Patient 50 - 74%   Wheelchair 150 feet activity     Assist      Assist Level: Supervision/Verbal cueing   Blood pressure 120/69, pulse 88, temperature 98.4 F (36.9 C), temperature source Oral, resp. rate 18, height 5' (1.524 m), weight 90.6 kg, SpO2 94 %.  Medical Problem List and Plan: 1.  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  Antithrombotics: -DVT/anticoagulation: Lovenox             -antiplatelet therapy: Plavix 75 mg daily 3. Pain Management: Tylenol as needed.   10/25- controlled- con't regimen  10/26- taped R shoulder to help with pain- will see how does- by OT 4. Mood: Provide emotional support             -antipsychotic agents: N/A 5. Neuropsych: This patient is not capable of making decisions on her own behalf. 6. Skin/Wound Care: Routine skin checks 7. Fluids/Electrolytes/Nutrition: Routine in and outs. 8.  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  Currently with a #4 cuffless trach as of 03/28/2020. Continue PMV as tolerated. Follow-up speech therapy. Tolerating room air.  9.  Post-stroke Dysphagia.    D1 nectars  Continue tube feeds  Freewater flushes increased to 250 mL every 3 hours, has normal echo  Labs ordered for tomorrow  10/25- BUN 24-  slightly elevated-  10. Uncontrolled diabetes mellitus with hyperglycemia.  Hemoglobin A1c 10.7.  NovoLog 10 units every 4 hours, Lantus insulin 24 units twice daily.   CBG (last 3)  Recent Labs    04/12/20 0000 04/12/20 0359 04/12/20 0834  GLUCAP 137* 135* 185*   Slightly elevated on 10/24, likely to be component as well  10/25- BGs 119-160- con't regimen for now  10/26- BGs 130-185- con't regimen- might increase Lantus tomorrow if still up 11.  Urinary retention.  Urecholine 10 mg 3 times daily.    Flomax started on 10/23            PVRs previously with retention 12.  Hyperlipidemia. Lipitor 13.  Tobacco abuse. Counseled on appropriate 14. Leukocytosis             WBCs 10.8 on 10/18, labs ordered  for tomorrow  10/25- WBC 11.2- will check U/A and Cx to make sure doesn't have UTI  10/26- no UTI per U/A results 15. Acute blood loss anemia:   Hgb 11.2 on 10/18, labs ordered for tomorrow 16. Constipation:   Improved 17. Hypotension:   10/25- BP controlled- con't regimen  10/26- BP looks good- con't regimen  LOS: 13 days A FACE TO FACE EVALUATION WAS PERFORMED  Deborah [name] 04/12/2020, 10:29 AM

## 2020-04-12 NOTE — Patient Care Conference (Signed)
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 Genice Rouge Care Coodinator Present: Lavera Guise, BSW;Jemari Hallum Marlyne Beards, RN, BSN, CRRN Nurse Present: Other (comment) Joycelyn Das, RN) PT Present: [name], PT OT Present: Earleen Newport, OT SLP Present: Suzzette Righter, CF-SLP PPS Coordinator present : Edson Snowball, PT     Current Status/Progress Goal Weekly Team Focus  Bowel/Bladder   Pt is incontinent x2  Pt will regain continence of b/b      Swallow/Nutrition/ Hydration   Dys 1 textures and thin liquids  Mod A  tolerance or diet and advancement trials of dys 2 textures   ADL's   Mod UB bathe, Max - total LB bathe/dress, Stedy for ADL transfers, sit to stands Mod, dependent for toileting tasks, improved communication, R shoulder discomfort  Min-Mod A overall  sit to stands, squat pivot transfers/ADLs, standing balance/tolerance, RUE AROM/AAROM   Mobility   mod A transfers sit to stand and stand vs squat pivot with mod cues; S sitting balance, min A dynamic, mod A standing balance, max A  gait w/ wall rail x 8' +2 for chair follow, w/c mobility mod A in hemi height chair w/ hemi technique.  CGA transfers, gait 50' min A, 8 steps min A, w/c min A  sitting balance, transfers, R side awareness (UE management), R LE activation for gait/balance, gait, w/c mobility   Communication   Max A, Mod A response to yes/no with communication board  mod A basic  naming objects/beginning phonemic sounds, response to yes/no, identifying obejcts/pictues and copying at letter/word level   Safety/Cognition/ Behavioral Observations  Max A  modA  basic problem solving in functional tasks   Pain   Pt hasn't complained of pain  pt will remain pain free      Skin   no new signs of skin breakdown  pt will maintain intact skin        Discharge Planning:  Discharging home with signnificant other and son   Team Discussion: Poor appetite, can take utensils in left hand, skin CDI, incontinent B/B. Patient prefers small W/C versus larger W/C. OT reports patient has made improvement and is engaging more, max assist for lower body bathing and dressing. Complains of right shoulder pain. PT reports patient is a mod assist squat pivot transfer, mod assist sitting balance with min assist goals for gait, stairs, and W/C. Right leg needs stability. Patient may need a ramp built. SLP reports patient has been upgraded to thin liquids. MD reports patient is severely aphasic. Patient on target to meet rehab goals: yes  *See Care Plan and progress notes  for long and short-term goals.   Revisions to Treatment Plan:  Amantadine started for Aphasia Teaching Needs: Continue with family education.  Current Barriers to Discharge: Decreased caregiver support, Home enviroment access/layout, Trach, Incontinence, Neurogenic bowel and bladder, Weight, Medication compliance, Nutritional means and New oxygen  Possible Resolutions to Barriers: Tube feeds going at night, encourage nutritional supplements, educate trach care,  bowel and bladder program, oxygen usage, continue with current medication regimen.     Medical Summary Current Status: no wounds; appetite POOR; TFs at night; Trach-  amantadine started with aphasia-LBM 2 days ago- incontinent B/B  Barriers to Discharge: Other (comments);Weight;New oxygen;Trach;Home enviroment access/layout;Decreased family/caregiver support;Behavior;Medical stability;Medication compliance;Incontinence;Neurogenic Bowel & Bladder;Nutrition means  Barriers to Discharge Comments: global aphasia- speaking 1 word occ.; TFs at night;  home with son/boyfriend- needs ramp; upgraded to thin liquids with MBS- poor intake;  d/c date 11/10 Possible Resolutions to [employer] Focus: family ed longer period- a little stronger- ADLs- engaging/following commands more; PT- mod A-max A;  focus on aphasia; R shoulder pain- leukocytosis- monitor   Continued Need for Acute Rehabilitation Level of Care: The patient requires daily medical management by a physician with specialized training in physical medicine and rehabilitation for the following reasons: Direction of a multidisciplinary physical rehabilitation program to maximize functional independence : Yes Medical management of patient stability for increased activity during participation in an intensive rehabilitation regime.: Yes Analysis of laboratory values and/or radiology reports with any subsequent need for medication adjustment and/or medical intervention. : Yes   I attest that I was present, lead the team conference, and concur with the assessment and plan of the team.   Tennis Must 04/12/2020, 3:58 PM

## 2020-04-13 ENCOUNTER — Inpatient Hospital Stay (HOSPITAL_COMMUNITY): Payer: Medicaid Other

## 2020-04-13 ENCOUNTER — Inpatient Hospital Stay (HOSPITAL_COMMUNITY): Payer: Medicaid Other | Admitting: Occupational Therapy

## 2020-04-13 LAB — GLUCOSE, CAPILLARY
Glucose-Capillary: 159 mg/dL — ABNORMAL HIGH (ref 70–99)
Glucose-Capillary: 249 mg/dL — ABNORMAL HIGH (ref 70–99)
Glucose-Capillary: 151 mg/dL — ABNORMAL HIGH (ref 70–99)
Glucose-Capillary: 57 mg/dL — ABNORMAL LOW (ref 70–99)
Glucose-Capillary: 84 mg/dL (ref 70–99)
Glucose-Capillary: 146 mg/dL — ABNORMAL HIGH (ref 70–99)

## 2020-04-13 NOTE — Progress Notes (Signed)
Physical Therapy Session Note  Patient Details  Name: Deborah [name] MRN: [id number] Date of Birth: [birth year]  Today's Date: 04/13/2020 PT Individual Time: 0945-1100 PT Individual Time Calculation (min): 75 min   Short Term Goals: Week 2:  PT Short Term Goal 1 (Week 2): Patient to propel w/c with S x 70' using hemitechnique PT Short Term Goal 2 (Week 2): Patient to perform static standing with min A x 1 minute for functional ADL's PT Short Term Goal 3 (Week 2): Patient to ambulate with max A x 10' with LRAD. PT Short Term Goal 4 (Week 2): Patient to initiate stair training.  Skilled Therapeutic Interventions/Progress Updates: Patient in w/c in room.  Assisted to dayroom.  Pivot to [place] mod A from hemi height w/c toward L side.  Positioned and pt performed 4 x 20 reps @ 30 cm/sec mod cues for R LE extension and elevated seat height for increased extension.  Transferred to w/c toward L again mod A.  Sit to stand at wall rail after ace wrap donned on R foot for DF assist.  Ambulated x 5' mod to max A with A for R LE progression and stabilization in stance.  Patient requesting to toilet so assisted in w/c to room and bathroom.  Use of grabbar to stand and pivot to toilet with A for clothing (doffed soiled brief).  Pt performed  hygiene while seated, sit to stand mod A with grabbar and A for donning pants/brief and mod A pivot to R to w/c mod cues.  Patient assisted to dayroom ambulated x 6' with ace wrap pt progressing R LE on her own, but mod A for balance and R LE stabilization in standing.  Obtained youth RW and placed hand splint on R side. Patient ambulated x 5' with RW and mod to max A for R LE stabilization and balance and watching R UE.  Patient in w/c propelled x 50; min A for steering and mod cues for L LE use for steering.  Assisted to room and left up with alarm belt and call bell in reach.  RN aware pt to return to supine prior to next session at 1:30 pm.      Therapy  Documentation Precautions:  Precautions Precautions: Fall Precaution Comments: NG, dense R Hemi, trach, aphasic Restrictions Weight Bearing Restrictions: No Pain: Pain Assessment Pain Score: 0-No pain    Therapy/Group: Individual Therapy  [name]  [place], PT 04/13/2020, 7:04 PM

## 2020-04-13 NOTE — Progress Notes (Signed)
Hypoglycemic Event  CBG: 57  Treatment: 4 oz juice/soda  Symptoms: None  Follow-up CBG: Time:1707 CBG Result:84  Possible Reasons for Event: Unknown  Comments/MD notified:per protocol patient returned to stable blood glucose.    Kionna Brier L Tammy Wickliffe

## 2020-04-13 NOTE — Plan of Care (Signed)
  Problem: Consults Goal: RH STROKE PATIENT EDUCATION Description: See Patient Education module for education specifics  Outcome: Progressing Goal: Diabetes Guidelines if Diabetic/Glucose > 140 Description: If diabetic or lab glucose is > 140 mg/dl - Initiate Diabetes/Hyperglycemia Guidelines & Document Interventions  Outcome: Progressing   Problem: RH BOWEL ELIMINATION Goal: RH STG MANAGE BOWEL WITH ASSISTANCE Description: STG Manage Bowel with min to mod Assistance. Outcome: Progressing Goal: RH STG MANAGE BOWEL W/MEDICATION W/ASSISTANCE Description: STG Manage Bowel with Medication with min to mod Assistance. Outcome: Progressing   Problem: RH BLADDER ELIMINATION Goal: RH STG MANAGE BLADDER WITH ASSISTANCE Description: STG Manage Bladder With min to mod Assistance Outcome: Progressing Goal: RH STG MANAGE BLADDER WITH MEDICATION WITH ASSISTANCE Description: STG Manage Bladder With Medication With  min to mod Assistance. Outcome: Progressing   Problem: RH SKIN INTEGRITY Goal: RH STG MAINTAIN SKIN INTEGRITY WITH ASSISTANCE Description: STG Maintain Skin Integrity With min to mod Assistance. Outcome: Progressing Goal: RH STG ABLE TO PERFORM INCISION/WOUND CARE W/ASSISTANCE Description: STG Able To Perform Incision/Wound Care With  min to mod Assistance. Outcome: Progressing   Problem: RH SAFETY Goal: RH STG ADHERE TO SAFETY PRECAUTIONS W/ASSISTANCE/DEVICE Description: STG Adhere to Safety Precautions With min to mod Assistance/Device. Outcome: Progressing   Problem: RH COGNITION-NURSING Goal: RH STG ANTICIPATES NEEDS/CALLS FOR ASSIST W/ASSIST/CUES Description: STG Anticipates Needs/Calls for Assist With min to mod Assistance and Cues. Outcome: Progressing   Problem: RH PAIN MANAGEMENT Goal: RH STG PAIN MANAGED AT OR BELOW PT'S PAIN GOAL Description: <3 on a 0-10 pain scale. Outcome: Progressing   Problem: RH KNOWLEDGE DEFICIT Goal: RH STG INCREASE KNOWLEDGE OF  DIABETES Description: Patient will demonstrate knowledge of diabetes management, medication management, blood sugar control, and follow up care with the MD with min to mod assist from CIR staff. Outcome: Progressing Goal: RH STG INCREASE KNOWLEDGE OF STROKE PROPHYLAXIS Description: Patient will be able to demonstrate knowledge on medications used and taken to aid in prevention of future strokes with min to mod assist from CIR staff. Outcome: Progressing   

## 2020-04-13 NOTE — Progress Notes (Signed)
Occupational Therapy Session Note  Patient Details  Name: Deborah [name] MRN: [id number] Date of Birth: 1970-07-12  Today's Date: 04/13/2020 OT Individual Time: 7124-5809 and 9833-8250 OT Individual Time Calculation (min): 72 min and 56 min   Short Term Goals: Week 2:  OT Short Term Goal 1 (Week 2): Pt will perform don shirt with hemidressing technique no more than Mod A OT Short Term Goal 2 (Week 2): Pt will perform sit to stand consistently with Min A of 1 in prep for LB ADL OT Short Term Goal 3 (Week 2): Pt will don pants with Mod A with sit to stand to don over hips OT Short Term Goal 4 (Week 2): Pt will perform seated ADLs with CS for sitting balance  Skilled Therapeutic Interventions/Progress Updates:    Pt greeted at time of session semireclined in bed with NT supervising self feeding, OT resume care. Supine to sit EOB with Mod A and pt finished eating breakfast at EOB with supervision, eating approx 30% of meal. Squat pivot toward L side with Mod A, set up at sink level for brief change at sit to stand level, Mod A to stand at sink level with R knee block but pt with L lateral weight shift, difficulty assisting. Sit to stand at end of bed with LUE support on bed frame to pull up with Mod A, in standing with therapist performeing peri care while pt maintained standing balance, donned new brief total A. Donned pants Max A with pt able to thread LLE and help bring up to thigh level, assist in donning over hips in standing at end of bed. Donned new gown with Mod-Max A with pt more engaging today with UB dressing. Donned shoes total A. Pt set up in wheelchair with 1/2 lap tray to support RUE, alarm on call bell in reach.   Session 2: Pt greeted at time of session sitting up in wheelchair agreeable to OT session with yes/no, body language, and thumbs up. Transported to gym via wheelchair for time management, performed dynavision seated with FWD weight shifting for core strengthening using LUE  to cross midline and reach for various lights, reaction speed taking 2x longer on R side of body, cues to visually scan. Also performed with RUE with both therapist assist/pt assist for AAROM using L to move R to also cross midline and hit lights for NMR of RUE. Pt provided with 4 colored circles red/orange/green/blue and both self directed color of interest and followed simple commands for color selection, moving R hand to desired color with LUE for guidance for NMR of RUE, with therapist providing weight bearing at each selection on table. Note that pt able to say "yellow, red, blue, green" with perseverating on blue/green but these are new words for her! Pt with increased verbal communication this week noted. Transported back to room via wheelchair, squat pivot to her R side to bed with Mod A, sit to supine Mod as well. Alarm on, call bell in reach.  Pt even waving goodbye with LUE.   Therapy Documentation Precautions:  Precautions Precautions: Fall Precaution Comments: NG, dense R Hemi, trach, aphasic Restrictions Weight Bearing Restrictions: No     Therapy/Group: Individual Therapy  Erasmo Score 04/13/2020, 12:43 PM

## 2020-04-14 ENCOUNTER — Inpatient Hospital Stay (HOSPITAL_COMMUNITY): Payer: Medicaid Other

## 2020-04-14 ENCOUNTER — Inpatient Hospital Stay (HOSPITAL_COMMUNITY): Payer: Medicaid Other | Admitting: Occupational Therapy

## 2020-04-14 ENCOUNTER — Inpatient Hospital Stay (HOSPITAL_COMMUNITY): Payer: Medicaid Other | Admitting: Speech Pathology

## 2020-04-14 DIAGNOSIS — I63312 Cerebral infarction due to thrombosis of left middle cerebral artery: Secondary | ICD-10-CM | POA: Diagnosis not present

## 2020-04-14 LAB — GLUCOSE, CAPILLARY
Glucose-Capillary: 121 mg/dL — ABNORMAL HIGH (ref 70–99)
Glucose-Capillary: 125 mg/dL — ABNORMAL HIGH (ref 70–99)
Glucose-Capillary: 141 mg/dL — ABNORMAL HIGH (ref 70–99)
Glucose-Capillary: 160 mg/dL — ABNORMAL HIGH (ref 70–99)
Glucose-Capillary: 161 mg/dL — ABNORMAL HIGH (ref 70–99)
Glucose-Capillary: 91 mg/dL (ref 70–99)

## 2020-04-14 MED ORDER — AMANTADINE HCL 100 MG PO CAPS
200.0000 mg | ORAL_CAPSULE | Freq: Every day | ORAL | Status: DC
  Administered 2020-04-16 – 2020-04-20 (×5): 200 mg via ORAL
  Filled 2020-04-14 (×6): qty 2

## 2020-04-14 NOTE — Plan of Care (Signed)
  Problem: Consults Goal: RH STROKE PATIENT EDUCATION Description: See Patient Education module for education specifics  Outcome: Progressing Goal: Diabetes Guidelines if Diabetic/Glucose > 140 Description: If diabetic or lab glucose is > 140 mg/dl - Initiate Diabetes/Hyperglycemia Guidelines & Document Interventions  Outcome: Progressing   Problem: RH BOWEL ELIMINATION Goal: RH STG MANAGE BOWEL WITH ASSISTANCE Description: STG Manage Bowel with min to mod Assistance. Outcome: Progressing Goal: RH STG MANAGE BOWEL W/MEDICATION W/ASSISTANCE Description: STG Manage Bowel with Medication with min to mod Assistance. Outcome: Progressing   Problem: RH BLADDER ELIMINATION Goal: RH STG MANAGE BLADDER WITH ASSISTANCE Description: STG Manage Bladder With min to mod Assistance Outcome: Progressing Goal: RH STG MANAGE BLADDER WITH MEDICATION WITH ASSISTANCE Description: STG Manage Bladder With Medication With  min to mod Assistance. Outcome: Progressing   Problem: RH SKIN INTEGRITY Goal: RH STG MAINTAIN SKIN INTEGRITY WITH ASSISTANCE Description: STG Maintain Skin Integrity With min to mod Assistance. Outcome: Progressing Goal: RH STG ABLE TO PERFORM INCISION/WOUND CARE W/ASSISTANCE Description: STG Able To Perform Incision/Wound Care With  min to mod Assistance. Outcome: Progressing   Problem: RH SAFETY Goal: RH STG ADHERE TO SAFETY PRECAUTIONS W/ASSISTANCE/DEVICE Description: STG Adhere to Safety Precautions With min to mod Assistance/Device. Outcome: Progressing   Problem: RH COGNITION-NURSING Goal: RH STG ANTICIPATES NEEDS/CALLS FOR ASSIST W/ASSIST/CUES Description: STG Anticipates Needs/Calls for Assist With min to mod Assistance and Cues. Outcome: Progressing   Problem: RH PAIN MANAGEMENT Goal: RH STG PAIN MANAGED AT OR BELOW PT'S PAIN GOAL Description: <3 on a 0-10 pain scale. Outcome: Progressing   Problem: RH KNOWLEDGE DEFICIT Goal: RH STG INCREASE KNOWLEDGE OF  DIABETES Description: Patient will demonstrate knowledge of diabetes management, medication management, blood sugar control, and follow up care with the MD with min to mod assist from CIR staff. Outcome: Progressing Goal: RH STG INCREASE KNOWLEDGE OF STROKE PROPHYLAXIS Description: Patient will be able to demonstrate knowledge on medications used and taken to aid in prevention of future strokes with min to mod assist from CIR staff. Outcome: Progressing   

## 2020-04-14 NOTE — Progress Notes (Signed)
[facility] PHYSICAL MEDICINE & REHABILITATION PROGRESS NOTE   Subjective/Complaints:  Pt nodding head yes and no Per OT, spoke all primary colors of rainbow, yesterday.  Not needing trach suctioning- using PMV.   Will cap trach and see if can get it out next week.  Per OT;  sats stay up during therapy.   ROS: cannot determine due to aphasia  Objective:   No results found. No results for input(s): WBC, HGB, HCT, PLT in the last 72 hours. No results for input(s): NA, K, CL, CO2, GLUCOSE, BUN, CREATININE, CALCIUM in the last 72 hours.  Intake/Output Summary (Last 24 hours) at 04/14/2020 1019 Last data filed at 04/14/2020 0729 Gross per 24 hour  Intake 80 ml  Output --  Net 80 ml        Physical Exam: Vital Signs Blood pressure 122/61, pulse 84, temperature 97.6 F (36.4 C), resp. rate 17, height 5' (1.524 m), weight 93.2 kg, SpO2 95 %. Constitutional: No distress . Vital signs reviewed. Awake, alert, appropriate, sitting up in w/c, OT brushing hair, NAD HENT: Normocephalic.  Atraumatic. (+ Cortrak in place Neck: + Trach with trach collar. Has PMV in place- looks good Eyes: EOMI. No discharge. Cardiovascular: RRR Respiratory: very slightly coarse; but great air movement B/L GI: Soft, NT, ND, (+)BS  Skin: Warm and dry.  Intact.no skin breakdown on bottom- peeled back foam to look Psych: trying to talk- still garbled for some- but understood a few words Musc: No edema in extremities.  No tenderness in extremities. Neuro: Alert Severe aphasia- can understand today Makes eye contact with examiner  Motor: Limited due to participation, but spontaneously moving left side, no movement noted on right side, unchanged No increase in tone noted  Assessment/Plan: 1. Functional deficits secondary to L MCA stroke with aphasia, and R heimplegia  which require 3+ hours per day of interdisciplinary therapy in a comprehensive inpatient rehab setting.  Physiatrist is providing close  team supervision and 24 hour management of active medical problems listed below.  Physiatrist and rehab team continue to assess barriers to discharge/monitor patient progress toward functional and medical goals  Care Tool:  Bathing    Body parts bathed by patient: Chest, Abdomen, Right upper leg, Left upper leg, Face, Right arm, Front perineal area, Right lower leg, Left lower leg   Body parts bathed by helper: Buttocks, Left arm, Right lower leg, Left lower leg     Bathing assist Assist Level: Moderate Assistance - Patient 50 - 74%     Upper Body Dressing/Undressing Upper body dressing   What is the patient wearing?: Pull over shirt    Upper body assist Assist Level: Moderate Assistance - Patient 50 - 74%    Lower Body Dressing/Undressing Lower body dressing      What is the patient wearing?: Pants     Lower body assist Assist for lower body dressing: Maximal Assistance - Patient 25 - 49% (Mod/Max)     Toileting Toileting Toileting Activity did not occur (Clothing management and hygiene only): N/A (no void or bm)  Toileting assist Assist for toileting: Maximal Assistance - Patient 25 - 49%     Transfers Chair/bed transfer  Transfers assist     Chair/bed transfer assist level: Moderate Assistance - Patient 50 - 74%     Locomotion Ambulation   Ambulation assist   Ambulation activity did not occur: Safety/medical concerns (due to patient fatigue/weakness)  Assist level: Moderate Assistance - Patient 50 - 74% Assistive device: Walker-rolling (ace  wrap, hand splint) Max distance: 5   Walk 10 feet activity   Assist  Walk 10 feet activity did not occur: Safety/medical concerns (due to patient fatigue/weakness)        Walk 50 feet activity   Assist Walk 50 feet with 2 turns activity did not occur: Safety/medical concerns (due to patient fatigue/weakness)         Walk 150 feet activity   Assist Walk 150 feet activity did not occur:  Safety/medical concerns (due to patient fatigue/weakness)         Walk 10 feet on uneven surface  activity   Assist Walk 10 feet on uneven surfaces activity did not occur: Safety/medical concerns (due to patient fatigue/weakness)         Wheelchair     Assist Will patient use wheelchair at discharge?: Yes Type of Wheelchair: Manual    Wheelchair assist level: Minimal Assistance - Patient > 75% Max wheelchair distance: 59'    Wheelchair 50 feet with 2 turns activity    Assist        Assist Level: Minimal Assistance - Patient > 75%   Wheelchair 150 feet activity     Assist      Assist Level: Dependent - Patient 0%   Blood pressure 122/61, pulse 84, temperature 97.6 F (36.4 C), resp. rate 17, height 5' (1.524 m), weight 93.2 kg, SpO2 95 %.  Medical Problem List and Plan: 1.  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  2.  Antithrombotics: -DVT/anticoagulation: Lovenox             -antiplatelet therapy: Plavix 75 mg daily 3. Pain Management: Tylenol as needed.   10/25- controlled- con't regimen  10/26- taped R shoulder to help with pain- will see how does- by OT 4. Mood: Provide emotional support             -antipsychotic agents: N/A 5. Neuropsych: This patient is not capable of making decisions on her own behalf. 6. Skin/Wound Care: Routine skin checks 7. Fluids/Electrolytes/Nutrition: Routine in and outs. 8.  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  Currently with a #4 cuffless trach as of 03/28/2020. Continue PMV as tolerated. Follow-up speech therapy. Tolerating room air.  9.  Post-stroke Dysphagia.    D1 nectars  Continue tube feeds  Freewater flushes increased to 250 mL every 3 hours, has normal  echo  Labs ordered for tomorrow  10/25- BUN 24- slightly elevated-   10/28- D1 thins now-  10. Uncontrolled diabetes mellitus with hyperglycemia.  Hemoglobin A1c 10.7.  NovoLog 10 units every 4 hours, Lantus insulin 24 units twice daily.   CBG (last 3)  Recent Labs    04/14/20 0028 04/14/20 0425 04/14/20 0728  GLUCAP 160* 161* 121*   Slightly elevated on 10/24, likely to be component as well  10/25- BGs 119-160- con't regimen for now  10/26- BGs 130-185- con't regimen- might increase Lantus tomorrow if still up  10/28- BGs better 120s-160s- con't regimen 11.  Urinary retention.  Urecholine 10 mg 3 times daily.    Flomax started on 10/23            PVRs previously with retention 12.  Hyperlipidemia. Lipitor 13.  Tobacco abuse. Counseled on appropriate 14. Leukocytosis  WBCs 10.8 on 10/18, labs ordered for tomorrow  10/25- WBC 11.2- will check U/A and Cx to make sure doesn't have UTI  10/26- no UTI per U/A results  10/28- per son and pt when asked, "she's always had a higher WBC"- might need outpt w/u but nothing to worry about for ID-wise, here.  15. Acute blood loss anemia:   Hgb 11.2 on 10/18, labs ordered for tomorrow 16. Constipation:   Improved 17. Hypotension:   10/28- BP controlled- con't regimen  LOS: 15 days A FACE TO FACE EVALUATION WAS PERFORMED  Askia Hazelip 04/14/2020, 10:19 AM

## 2020-04-14 NOTE — Progress Notes (Signed)
Occupational Therapy Weekly Progress Note  Patient Details  Name: Deborah [name] MRN: [id number] Date of Birth: Sep 17, 1970  Beginning of progress report period: March 31, 2020 End of progress report period: April 14, 2020  Today's Date: 04/14/2020 OT Individual Time: 8527-7824 OT Individual Time Calculation (min): 62 min    Patient has met 2 of 4 short term goals.  Pt is currently Max A for LB dressing in standing with R knee blocked and able to assist with using LUE to help don over hips, sit to stands consistently Mod A expect occasionally more help needed from low surface, performing squat pivot transfers toward L side with Mod A with improved sequencing, and UB bathe/dress Mod A. Pt is verbally communicating more with simple words/phrases, expressions, hand gestures, and overall more interactive with staff. Pt's RUE continues to have very limited AROM but is starting to develop some tone distally. O2 sats remain in high 90s with activity with no supplemental O2.   Patient continues to demonstrate the following deficits: muscle weakness, decreased cardiorespiratoy endurance, impaired timing and sequencing, abnormal tone, unbalanced muscle activation, decreased coordination and decreased motor planning, decreased attention to right and decreased motor planning, decreased initiation, decreased attention, decreased awareness, decreased problem solving, decreased safety awareness and delayed processing and decreased sitting balance, decreased standing balance, decreased postural control, hemiplegia and decreased balance strategies and therefore will continue to benefit from skilled OT intervention to enhance overall performance with BADL and Reduce care partner burden.  Patient progressing toward long term goals..  Continue plan of care.  OT Short Term Goals Week 2:  OT Short Term Goal 1 (Week 2): Pt will perform don shirt with hemidressing technique no more than Mod A OT Short Term Goal 1  - Progress (Week 2): Met OT Short Term Goal 2 (Week 2): Pt will perform sit to stand consistently with Min A of 1 in prep for LB ADL OT Short Term Goal 2 - Progress (Week 2): Progressing toward goal OT Short Term Goal 3 (Week 2): Pt will don pants with Mod A with sit to stand to don over hips OT Short Term Goal 3 - Progress (Week 2): Progressing toward goal OT Short Term Goal 4 (Week 2): Pt will perform seated ADLs with CS for sitting balance OT Short Term Goal 4 - Progress (Week 2): Met Week 3:  OT Short Term Goal 1 (Week 3): Pt will don pants with Mod A with sit to stand to don over hips OT Short Term Goal 2 (Week 3): Pt will perform sit to stand consistently with Min A of 1 in prep for LB ADL OT Short Term Goal 3 (Week 3): Pt will perform don shirt with hemidressing technique no more than Min A  Skilled Therapeutic Interventions/Progress Updates:    Pt greeted at time of session semireclined in bed resting but agreeable to OT session, initiating taking off covers today which is new. Supine to sit Mod A for RLE management and scooting to EOB. Squat pivot to wheelchair to L with Mod A, able to help adjust hips as well. Note brief was not wet today as this is new - pt normally incontinent in am. Set up at sink level and doffed gown Min A with mod cues for sequencing. UB bathing Mod, almost Min A but is progressing. LB bathing with pt able to wash thighs, calf area, but declined feet today, therapist washing behind pt for thoroughness but pt able to forward weigh shift to complete. Sit  to stand at sink level for LB dress with Max A, improving. Pt able to thread LLE into pants and therapist assist with R. In standing with R knee block assisted with donning pants over hips. Assist with grooming to brush hair d/t tangles, pt brushing hair after therapist attending to R side of hair well. Set up in wheelchair with lap tray on, allarm on, call bell in reach. Note more expression and verbal communication today  with bye/yes/no.   Therapy Documentation Precautions:  Precautions Precautions: Fall Precaution Comments: NG, dense R Hemi, trach, aphasic Restrictions Weight Bearing Restrictions: No    Therapy/Group: Individual Therapy  Viona Gilmore 04/14/2020, 9:30 AM

## 2020-04-14 NOTE — Progress Notes (Signed)
Speech Language Pathology Daily Session Note  Patient Details  Name: Deborah [name] MRN: [id number] Date of Birth: 17-Feb-1971  Today's Date: 04/14/2020 SLP Individual Time: 1335-1430 SLP Individual Time Calculation (min): 55 min  Short Term Goals: Week 2: SLP Short Term Goal 1 (Week 2): Patient will be able to imitate at word level with 75% accuracy and modA SLP Short Term Goal 2 (Week 2): Patient will tolerate trials of thin liquids and upgraded solids with minimal overt s/s of aspiration/penetration SLP Short Term Goal 3 (Week 2): Patient will utilize 3-4 choice picture/word communication board to communicate wants/needs with minA SLP Short Term Goal 4 (Week 2): Patient will point to object photos/pictures in field of 4 when named, with 80% accuacy. SLP Short Term Goal 5 (Week 2): Patient will answer basic level yes/no questions verbally or via yes/no choice board, with 80% accuracy. SLP Short Term Goal 6 (Week 2): Patient will point to identify basic level verb/actions (eat, sleep, etc) in field of two with 80% accuracy.  Skilled Therapeutic Interventions:   Patient seen with Mom present in room for language and swallow function goals. Patient self-fed bites of rice krispies cereal and did not exhibit any overt coughing or throat clearing. She did have trace-mild anterior leakage of milk from right corner of mouth. During language session, patient able to imitate at 1-2 syllable, a few 3 syllable words with overall 80% accuracy. She was able to point to request with printed words field of four, but unable to point to identify with field of four words, so SLP decreased this to field of 2. Patient continues to benefit from skilled SLP intervention to maximize speech-language and swallow function prior to discharge.   Pain Pain Assessment Pain Scale: 0-10 Pain Score: 0-No pain  Therapy/Group: Individual Therapy  Pablo Lawrence 04/14/2020, 4:44 PM

## 2020-04-14 NOTE — Progress Notes (Signed)
Physical Therapy Session Note  Patient Details  Name: Deborah [name] MRN: [id number] Date of Birth: 17-Jan-1971  Today's Date: 04/14/2020 PT Individual Time: 0930-1045 PT Individual Time Calculation (min): 75 min   Short Term Goals: Week 3:  PT Short Term Goal 1 (Week 2): Patient to propel w/c with S x 70' using hemitechnique PT Short Term Goal 2 (Week 2): Patient to perform static standing with min A x 1 minute for functional ADL's PT Short Term Goal 3 (Week 2): Patient to ambulate with max A x 10' with LRAD. PT Short Term Goal 4 (Week 2): Patient to initiate stair training.  Skilled Therapeutic Interventions/Progress Updates:    Patient up in w/c with mother in the room.  Mother updates pt said "Momma" this morning.  Also reports her brother is now in Hospice care due to brain tumor.  Reports pt's significant other is building a ramp.  Assisted pt to don shoes.  Pushed in w/c to gym.  Wrapped R ankle for DF assist.  Sit to stand to RW with hand splint. Patient with R LE flexor tone unable to control with +1 A.  Assisted to mat via squat pivot max cues and mod A.  With red ball rolling forward and back both hands on ball A for RUE for anterior weight shift and R UE scapular retraction protraction.  Sit to squat with hands on ball and pt c/o R UE pain, continued until supine even after pushing pulling with b/s table.  Patient supine to sit mod A.  Sit to stand 4 x 5 reps with focus on anterior weight shift and R lateral weight shift.  Forced use with 6" then 4" step under L with mod to max A to control R hip.  Squat pivot to w/c mod A.  Assisted to hallway at wall rail and wrapped R foot with ace wrap for DF assist.  Patient ambulated 2 x 6' with mod to max A for balance, controlling R LE, able to turn to sit in chair each time with mod A.  Patient assisted in w/c to room.  Stand pivot to bed mod A; sit to supine min A for R LE, cues for positioning.  Left in supine with needs in reach, bed alarm  active and mother in the room.  Asked nursing to get back up for lunch and SLP this pm.   Therapy Documentation Precautions:  Precautions Precautions: Fall Precaution Comments: NG, dense R Hemi, trach, aphasic Restrictions Weight Bearing Restrictions: No       Therapy/Group: Individual Therapy  [name]  Cyndi Alexes Lamarque,PT 04/14/2020, 8:21 AM

## 2020-04-15 ENCOUNTER — Inpatient Hospital Stay (HOSPITAL_COMMUNITY): Payer: Medicaid Other | Admitting: Physical Therapy

## 2020-04-15 ENCOUNTER — Inpatient Hospital Stay (HOSPITAL_COMMUNITY): Payer: Medicaid Other | Admitting: Occupational Therapy

## 2020-04-15 ENCOUNTER — Inpatient Hospital Stay (HOSPITAL_COMMUNITY): Payer: Medicaid Other | Admitting: Speech Pathology

## 2020-04-15 ENCOUNTER — Inpatient Hospital Stay (HOSPITAL_COMMUNITY): Payer: Medicaid Other

## 2020-04-15 DIAGNOSIS — I63312 Cerebral infarction due to thrombosis of left middle cerebral artery: Secondary | ICD-10-CM | POA: Diagnosis not present

## 2020-04-15 LAB — GLUCOSE, CAPILLARY
Glucose-Capillary: 105 mg/dL — ABNORMAL HIGH (ref 70–99)
Glucose-Capillary: 118 mg/dL — ABNORMAL HIGH (ref 70–99)
Glucose-Capillary: 142 mg/dL — ABNORMAL HIGH (ref 70–99)
Glucose-Capillary: 169 mg/dL — ABNORMAL HIGH (ref 70–99)
Glucose-Capillary: 169 mg/dL — ABNORMAL HIGH (ref 70–99)
Glucose-Capillary: 181 mg/dL — ABNORMAL HIGH (ref 70–99)

## 2020-04-15 MED ORDER — INSULIN GLARGINE 100 UNIT/ML ~~LOC~~ SOLN
25.0000 [IU] | Freq: Two times a day (BID) | SUBCUTANEOUS | Status: DC
  Administered 2020-04-15 – 2020-04-23 (×16): 25 [IU] via SUBCUTANEOUS
  Filled 2020-04-15 (×17): qty 0.25

## 2020-04-15 NOTE — Progress Notes (Signed)
Physical Therapy Session Note  Patient Details  Name: Deborah [name] MRN: [id number] Date of Birth: 1971-05-07  Today's Date: 04/15/2020 PT Individual Time: 0814-4818 PT Individual Time Calculation (min): 42 min   Short Term Goals: Week 2:  PT Short Term Goal 1 (Week 2): Patient to propel w/c with S x 70' using hemitechnique PT Short Term Goal 2 (Week 2): Patient to perform static standing with min A x 1 minute for functional ADL's PT Short Term Goal 3 (Week 2): Patient to ambulate with max A x 10' with LRAD. PT Short Term Goal 4 (Week 2): Patient to initiate stair training.  Skilled Therapeutic Interventions/Progress Updates:    Pt received sitting in w/c and agreeable to therapy session - pt able to use thumbs up/down and 1 word response ("mhmm") to communicate during session due to aphasia.  Transported to/from gym in w/c for time management and energy conservation. R squat pivot w/c>EOM with heavy mod assist for lifting/pivoting hips. Sit<>stands EOM<>RW with mod assist for lifting/balance and therapist blocking R knee to prevent buckling and guarding to avoid hyperextension - pt noted to have R ankle inversion therefore donned ankle DF assist ACE wrap with eversion bias to protect ankle during standing/WBing tasks. Standing with RW, attempted R LE NMR focusing on stance control via lifting L heel but pt unable to understand task due to aphasia and unsafe to progress to pre-gait training of L foot stepping forwards/backwards using RW due to significant R LE paresis. In standing, with R UE support on RW hand orthotic performed L UE cross body reaching task to promote R LE WBing and with min/mod assist for balance - cuing and manual facilitation for increased R hip extensor activation to improve balance and therapist continuing to block R knee to prevent buckle and hyperextension. Pt requires frequent seated rest breaks due to impaired endurance - SpO2 99-100% and HR 95-96bpm. L squat pivot  EOM>w/c with mod assist for lifting/pivoting hips and cuing for sequencing as well as for safety awareness as pt occasionally initiates a task prior to therapist being in position (responds well to counting to 3). Transported back to room and pt requesting for assist back to bed. R squat pivot w/c>EOB with mod assist for lifting/pivoting hips. Sit>supine with min assist for trunk descent and R LE management into bed. Pt left supine in bed with needs in reach, HOB elevated, and bed alarm on.  Therapy Documentation Precautions:  Precautions Precautions: Fall Precaution Comments: NG, dense R Hemi, trach, aphasic Restrictions Weight Bearing Restrictions: No  Pain:   No indications of pain throughout session.   Therapy/Group: Individual Therapy  Ginny Forth , PT, DPT, CSRS  04/15/2020, 12:50 PM

## 2020-04-15 NOTE — Progress Notes (Signed)
[place] PHYSICAL MEDICINE & REHABILITATION PROGRESS NOTE   Subjective/Complaints:  Pt specifically asked for "MILK" multiple times- eating breakfast- supervision by NT.   Pt denies pain this AM- but not reliable historian.   QBH:ALPFXT determine due to aphasia  Objective:   No results found. No results for input(s): WBC, HGB, HCT, PLT in the last 72 hours. No results for input(s): NA, K, CL, CO2, GLUCOSE, BUN, CREATININE, CALCIUM in the last 72 hours.  Intake/Output Summary (Last 24 hours) at 04/15/2020 1557 Last data filed at 04/15/2020 0700 Gross per 24 hour  Intake 30 ml  Output --  Net 30 ml        Physical Exam: Vital Signs Blood pressure 97/64, pulse 81, temperature 98 F (36.7 C), temperature source Oral, resp. rate 18, height 5' (1.524 m), weight 93.6 kg, SpO2 100 %. Constitutional: No distress . Vital signs reviewed. Awake, alert- sitting up EOB eating breakfast, NT at bedside, NAD HENT: Normocephalic.  Atraumatic. (+ Cortrak in place still) Neck: + Trach with trach collar. capped Eyes: EOMI. No discharge. Cardiovascular: RRR Respiratory: trach in place, capped; CTA B/L- no W/R/R- good air movement GI: Soft, NT, ND, (+)BS  Skin: Warm and dry.  Intact.no skin breakdown on bottom- peeled back foam to look Psych: really trying to pronounce words this Am- perseverating on milk Musc: No edema in extremities.  No tenderness in extremities. Neuro: Alert Severe aphasia- can understand really well today Makes eye contact with examiner  Motor: Limited due to participation, but spontaneously moving left side, no movement noted on right side, unchanged No increase in tone noted  Assessment/Plan: 1. Functional deficits secondary to L MCA stroke with aphasia, and R heimplegia  which require 3+ hours per day of interdisciplinary therapy in a comprehensive inpatient rehab setting.  Physiatrist is providing close team supervision and 24 hour management of active medical  problems listed below.  Physiatrist and rehab team continue to assess barriers to discharge/monitor patient progress toward functional and medical goals  Care Tool:  Bathing    Body parts bathed by patient: Chest, Abdomen, Right upper leg, Left upper leg, Face, Front perineal area, Right lower leg, Left lower leg   Body parts bathed by helper: Right arm, Left arm, Buttocks, Right lower leg, Left lower leg     Bathing assist Assist Level: 2 Helpers (due to large incontinent BM beforehand)     Upper Body Dressing/Undressing Upper body dressing   What is the patient wearing?: Pull over shirt    Upper body assist Assist Level: Moderate Assistance - Patient 50 - 74%    Lower Body Dressing/Undressing Lower body dressing      What is the patient wearing?: Pants, Incontinence brief     Lower body assist Assist for lower body dressing: Maximal Assistance - Patient 25 - 49%     Toileting Toileting Toileting Activity did not occur (Clothing management and hygiene only): N/A (no void or bm)  Toileting assist Assist for toileting: Maximal Assistance - Patient 25 - 49%     Transfers Chair/bed transfer  Transfers assist     Chair/bed transfer assist level: Moderate Assistance - Patient 50 - 74%     Locomotion Ambulation   Ambulation assist   Ambulation activity did not occur: Safety/medical concerns (due to patient fatigue/weakness)  Assist level: Moderate Assistance - Patient 50 - 74% Assistive device: Other (comment) (wall rail, ace wrap) Max distance: 6   Walk 10 feet activity   Assist  Walk 10  feet activity did not occur: Safety/medical concerns (due to patient fatigue/weakness)        Walk 50 feet activity   Assist Walk 50 feet with 2 turns activity did not occur: Safety/medical concerns (due to patient fatigue/weakness)         Walk 150 feet activity   Assist Walk 150 feet activity did not occur: Safety/medical concerns (due to patient  fatigue/weakness)         Walk 10 feet on uneven surface  activity   Assist Walk 10 feet on uneven surfaces activity did not occur: Safety/medical concerns (due to patient fatigue/weakness)         Wheelchair     Assist Will patient use wheelchair at discharge?: Yes Type of Wheelchair: Manual    Wheelchair assist level: Minimal Assistance - Patient > 75% Max wheelchair distance: 71'    Wheelchair 50 feet with 2 turns activity    Assist        Assist Level: Minimal Assistance - Patient > 75%   Wheelchair 150 feet activity     Assist      Assist Level: Dependent - Patient 0%   Blood pressure 97/64, pulse 81, temperature 98 F (36.7 C), temperature source Oral, resp. rate 18, height 5' (1.524 m), weight 93.6 kg, SpO2 100 %.  Medical Problem List and Plan: 1.  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  2.  Antithrombotics: -DVT/anticoagulation: Lovenox             -antiplatelet therapy: Plavix 75 mg daily 3. Pain Management: Tylenol as needed.   10/25- controlled- con't regimen  10/26- taped R shoulder to help with pain- will see how does- by OT  10/29- appears to have less pain 4. Mood: Provide emotional support             -antipsychotic agents: N/A 5. Neuropsych: This patient is not capable of making decisions on her own behalf. 6. Skin/Wound Care: Routine skin checks 7. Fluids/Electrolytes/Nutrition: Routine in and outs. 8.  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  Currently with a #4 cuffless trach as of 03/28/2020. Continue PMV as tolerated. Follow-up speech therapy. Tolerating room air.  9.  Post-stroke Dysphagia.    D1 nectars  Continue tube feeds  Freewater flushes increased to 250 mL every 3  hours, has normal echo  Labs ordered for tomorrow  10/25- BUN 24- slightly elevated-   10/28- D1 thins now-   10/29- eating well- will monitor BUN 10. Uncontrolled diabetes mellitus with hyperglycemia.  Hemoglobin A1c 10.7.  NovoLog 10 units every 4 hours, Lantus insulin 24 units twice daily.   CBG (last 3)  Recent Labs    04/15/20 0417 04/15/20 0758 04/15/20 1221  GLUCAP 169* 169* 181*   Slightly elevated on 10/24, likely to be component as well  10/25- BGs 119-160- con't regimen for now  10/26- BGs 130-185- con't regimen- might increase Lantus tomorrow if still up  10/28- BGs better 120s-160s- con't regimen  10/29- increase lantus slightly to 25 units BID 11.  Urinary retention.  Urecholine 10 mg 3 times daily.    Flomax started on 10/23            PVRs previously with retention 12.  Hyperlipidemia. Lipitor 13.  Tobacco abuse. Counseled  on appropriate 14. Leukocytosis             WBCs 10.8 on 10/18, labs ordered for tomorrow  10/25- WBC 11.2- will check U/A and Cx to make sure doesn't have UTI  10/26- no UTI per U/A results  10/28- per son and pt when asked, "she's always had a higher WBC"- might need outpt w/u but nothing to worry about for ID-wise, here.  15. Acute blood loss anemia:   Hgb 11.2 on 10/18, labs ordered for tomorrow 16. Constipation:   Improved 17. Hypotension:   10/28- BP controlled- con't regimen 18. Trach for previous resp failure  10/29- capped- if does well over weekend, will remove early next week  LOS: 16 days A FACE TO FACE EVALUATION WAS PERFORMED  Buena Boehm 04/15/2020, 3:57 PM

## 2020-04-15 NOTE — Progress Notes (Signed)
Speech Language Pathology Weekly Progress and Session Note  Patient Details  Name: Deborah [name] MRN: [id number] Date of Birth: March 02, 1971  Beginning of progress report period: April 08, 2020 End of progress report period: April 15, 2020  Today's Date: 04/15/2020 SLP Individual Time: 0800-0900 SLP Individual Time Calculation (min): 60 min  Short Term Goals: Week 2: SLP Short Term Goal 1 (Week 2): Patient will be able to imitate at word level with 75% accuracy and modA SLP Short Term Goal 1 - Progress (Week 2): Met SLP Short Term Goal 2 (Week 2): Patient will tolerate trials of thin liquids and upgraded solids with minimal overt s/s of aspiration/penetration SLP Short Term Goal 2 - Progress (Week 2): Met SLP Short Term Goal 3 (Week 2): Patient will utilize 3-4 choice picture/word communication board to communicate wants/needs with minA SLP Short Term Goal 3 - Progress (Week 2): Met SLP Short Term Goal 4 (Week 2): Patient will point to object photos/pictures in field of 4 when named, with 80% accuacy. SLP Short Term Goal 4 - Progress (Week 2): Partly met SLP Short Term Goal 5 (Week 2): Patient will answer basic level yes/no questions verbally or via yes/no choice board, with 80% accuracy. SLP Short Term Goal 5 - Progress (Week 2): Met SLP Short Term Goal 6 (Week 2): Patient will point to identify basic level verb/actions (eat, sleep, etc) in field of two with 80% accuracy. SLP Short Term Goal 6 - Progress (Week 2): Not met    New Short Term Goals: Week 3: SLP Short Term Goal 1 (Week 3): Patient will point to identify basic level verb/actions (eat, sleep, etc) in field of two with 80% accuracy. SLP Short Term Goal 2 (Week 3): Patient will manage left anterior spillage and oral pocketing of solids with modA and use of mirror for awareness. SLP Short Term Goal 3 (Week 3): Patient will imitate at multisyllabic word and functional phrase level with 75% accuracy and modA. SLP Short Term  Goal 4 (Week 3): Patient will use communication board with 4-6 field choices to request basic needs/wants as well as with task specific communication boards, with minA. SLP Short Term Goal 5 (Week 3): Patient will tolerate trials of Dysphagia 2 solids with minimal difficulty managing at oral phase and minimal A cues. SLP Short Term Goal 6 (Week 3): Patient will respond to basic level yes/no questions (verbally, head nods, yes/no board pointing) with 80% accuracy.  Weekly Progress Updates: Patient made good progress and met 4/6 STG's. She has demonstrated improved ability to point to request and identify (pictures and some words), imitate at word level, verbalize basic needs "done" (when done eating food), has been upgraded to thin liquids as per recent MBS which revealed improvement in swallow function. Her frustration tolerance is improved and she seems motivated by her success during activities.      Intensity: Minumum of 1-2 x/day, 30 to 90 minutes Frequency: 3 to 5 out of 7 days Duration/Length of Stay: 11/10 Treatment/Interventions: Cognitive remediation/compensation;Dysphagia/aspiration precaution training;Environmental controls;Cueing hierarchy;Functional tasks;Speech/Language facilitation;Patient/family education;Multimodal communication approach   Daily Session  Skilled Therapeutic Interventions: Patient seen by SLP to address swallow and speech goals. Patient consumed Dys2 textures (rice krispies cereal with milk) without significant oral residuals, but with anterior spillage without awareness. Patient did not exhibit any overt s/s of aspiration or penetration. Of note, her trach is currently capped. Patient able to achieve very good voicing with trach capped and did have some intermittent hoarse coughing. She verbalized colors  of markers, "yellow", "black" and "red" during activity. She imitated at word level with 75% accuracy and min-mod A cues to perform. Patient continues to benefit  from skilled SLP intervention to maximize cognitive-linguistic, speech and swallow funciton.    General    Pain  no pain   Therapy/Group: Individual Therapy  Sonia Baller, MA, CCC-SLP Speech Therapy

## 2020-04-15 NOTE — Progress Notes (Signed)
Physical Therapy Weekly Progress Note  Patient Details  Name: Deborah [name] MRN: [id number] Date of Birth: 1971/05/26  Beginning of progress report period: April 09, 2020 End of progress report period: April 15, 2020  Today's Date: 04/15/2020 PT Individual Time:1130  - 1200     Patient has met 2 of 4 short term goals.  [place] has made progress towards all goals ambulating with improved R LE stability and helping to progress the leg as well as able to support with A during initiating stair training this week.  She has participated well, though still fatigues quickly.   Patient continues to demonstrate the following deficits muscle paralysis, unbalanced muscle activation and decreased coordination and decreased attention, decreased problem solving and decreased safety awareness and therefore will continue to benefit from skilled PT intervention to increase functional independence with mobility.  Patient progressing toward long term goals..  Continue plan of care.  PT Short Term Goals Week 2:  PT Short Term Goal 1 (Week 2): Patient to propel w/c with S x 70' using hemitechnique PT Short Term Goal 1 - Progress (Week 2): Progressing toward goal PT Short Term Goal 2 (Week 2): Patient to perform static standing with min A x 1 minute for functional ADL's PT Short Term Goal 2 - Progress (Week 2): Met PT Short Term Goal 3 (Week 2): Patient to ambulate with max A x 10' with LRAD. PT Short Term Goal 3 - Progress (Week 2): Progressing toward goal PT Short Term Goal 4 (Week 2): Patient to initiate stair training. PT Short Term Goal 4 - Progress (Week 2): Met Week 3:  PT Short Term Goal 1 (Week 3): Patient to ambulate 70' with mod A and LRAD PT Short Term Goal 2 (Week 3): Patient to negotiate 2 steps with mod A with rail PT Short Term Goal 3 (Week 3): Patient to propel w/c x 4' with S PT Short Term Goal 4 (Week 3): Patient to perform car transfer with mod A  Skilled Therapeutic  Interventions/Progress Update:   Patient in w/c in her room with boyfriend present, speaking with SLP regarding diet upgrades.  Patient assisted in w/c to gym.  Sit to stand to w/c mod A and A to placed R hand in hand splint.  Ambulated x 8' with +2 mod A for safety with R foot placement, balance, walker management and for obtaining w/c when pt fatigued.  Performed stair negotiation x 2 3" steps with L rail and +2 max A for safety with R foot placement, stabilization when stepping with L foot.  Patient backed down steps and sat from 1st step.  Assisted in w/c to bathroom.  Stand pivot to toilet mod A +2 max cues and A for clothing management.  Assisted to change brief and for hygiene.  Patient pivot to w/c mod A +2 and cues.  Left in w/c with seat alarm activated and needs within reach.  Therapy Documentation Precautions:  Precautions Precautions: Fall Precaution Comments: NG, dense R Hemi, trach, aphasic Restrictions Weight Bearing Restrictions: No  Pain: Pain Assessment Faces Pain Scale: Hurts a little bit Pain Type: Acute pain Pain Location: Shoulder Pain Orientation: Right Pain Descriptors / Indicators: Tender Pain Onset: With Activity Pain Intervention(s): Elevated extremity  Therapy/Group: Individual Therapy  Reginia Naas  [place], PT 04/15/2020, 8:30 AM

## 2020-04-15 NOTE — Progress Notes (Signed)
Occupational Therapy Session Note  Patient Details  Name: Deborah [name] MRN: [id number] Date of Birth: 19-Mar-1971  Today's Date: 04/15/2020 OT Individual Time: 1607-3710 OT Individual Time Calculation (min): 74 min   Skilled Therapeutic Interventions/Progress Updates:    Pt greeted in bed, used the yes/no communication board with pt indicating 1) no c/o pain 2) she wanted to bathe and 3) she would like to use the restroom. Supine<sit completed with Mod A with HOB elevated using the bedrail. Mod A for squat pivot<standard w/c<elevated drop arm BSC over toilet, going towards the Lt side. Mod A for sit<stand using the grab bar while OT supported the Rt side. Pt with large incontinent void of B+B in brief. Note that pt began crying after scooting back on the toilet, even with LEs propped up on sideways trash can. Using the communication board, pt indicated that 1) she was in pain and 2) she was uncomfortable. Total A for hygiene using lateral and anterior leans, calming cues utilized as pt was still upset though she did follow instruction. Max A for squat-stand pivot back to w/c going towards the affected side. She resumed with bathing/dressing tasks sit<stand at the sink, pt visibly more comfortable and also expressed this using the board. +2 assist for ensuring hygiene thoroughness in standing at sit<stand level, Mod A of 1 for static and dynamic standing balance. Vcs and assistance for implementing hemi techniques, HOH to use the Rt UE functionally. Pt still exhibits no active movement in her Rt LE. SO Jason arrived during ADL tasks, discussed joint protection strategies to prevent sublux. Pt used the communication board to select whether she wanted to sit in standard w/c vs TIS. She remained in the standard w/c with all needs within reach and chair alarm set, satting at 96% via capped trach. SO still present.    02 sats 96-99% throughout session  Therapy Documentation Precautions:   Precautions Precautions: Fall Precaution Comments: NG, dense R Hemi, trach, aphasic Restrictions Weight Bearing Restrictions: No Vital Signs: Therapy Vitals Pulse Rate: 91 Resp: 18 Patient Position (if appropriate): Sitting Oxygen Therapy SpO2: 96 % O2 Device: Room Air Pain:   ADL: ADL Eating: NPO Upper Body Bathing: Moderate assistance, Maximal cueing Where Assessed-Upper Body Bathing: Edge of bed Lower Body Bathing: Dependent Where Assessed-Lower Body Bathing: Edge of bed Upper Body Dressing: Maximal assistance Where Assessed-Upper Body Dressing: Edge of bed Lower Body Dressing: Dependent Toileting: Not assessed      Therapy/Group: Individual Therapy  Kalyan Barabas A Andriel Omalley 04/15/2020, 12:24 PM

## 2020-04-16 DIAGNOSIS — I63512 Cerebral infarction due to unspecified occlusion or stenosis of left middle cerebral artery: Secondary | ICD-10-CM | POA: Diagnosis not present

## 2020-04-16 DIAGNOSIS — G8191 Hemiplegia, unspecified affecting right dominant side: Secondary | ICD-10-CM | POA: Diagnosis not present

## 2020-04-16 DIAGNOSIS — I69320 Aphasia following cerebral infarction: Secondary | ICD-10-CM | POA: Diagnosis not present

## 2020-04-16 LAB — GLUCOSE, CAPILLARY
Glucose-Capillary: 111 mg/dL — ABNORMAL HIGH (ref 70–99)
Glucose-Capillary: 118 mg/dL — ABNORMAL HIGH (ref 70–99)
Glucose-Capillary: 149 mg/dL — ABNORMAL HIGH (ref 70–99)
Glucose-Capillary: 152 mg/dL — ABNORMAL HIGH (ref 70–99)
Glucose-Capillary: 159 mg/dL — ABNORMAL HIGH (ref 70–99)

## 2020-04-16 NOTE — Progress Notes (Signed)
[facility] PHYSICAL MEDICINE & REHABILITATION PROGRESS NOTE   Subjective/Complaints:  Pt appears comfortable. Nurse reports no problems  ROS: limited due to language/communication    Objective:   No results found. No results for input(s): WBC, HGB, HCT, PLT in the last 72 hours. No results for input(s): NA, K, CL, CO2, GLUCOSE, BUN, CREATININE, CALCIUM in the last 72 hours.  Intake/Output Summary (Last 24 hours) at 04/16/2020 1006 Last data filed at 04/16/2020 0815 Gross per 24 hour  Intake 55 ml  Output --  Net 55 ml        Physical Exam: Vital Signs Blood pressure (!) 116/59, pulse 90, temperature 98.4 F (36.9 C), temperature source Oral, resp. rate (!) 24, height 5' (1.524 m), weight 92 kg, SpO2 95 %. Constitutional: No distress . Vital signs reviewed. HEENT: EOMI, oral membranes moist, NGT Neck: supple, trach Cardiovascular: RRR without murmur. No JVD    Respiratory/Chest: CTA Bilaterally without wheezes or rales. Normal effort    GI/Abdomen: BS +, non-tender, non-distended Ext: no clubbing, cyanosis, or edema Psych: pleasant and cooperative Skin: warm dry Musc: No edema in extremities.  No tenderness in extremities. Neuro: Alert Severe aphasia- expresses occasional words. Followed some simple commands Makes eye contact with examiner  Motor: Limited due to participation, but spontaneously moving left side, no movement noted on right side, unchanged No abnl tone   Assessment/Plan: 1. Functional deficits secondary to L MCA stroke with aphasia, and R heimplegia  which require 3+ hours per day of interdisciplinary therapy in a comprehensive inpatient rehab setting.  Physiatrist is providing close team supervision and 24 hour management of active medical problems listed below.  Physiatrist and rehab team continue to assess barriers to discharge/monitor patient progress toward functional and medical goals  Care Tool:  Bathing    Body parts bathed by patient:  Chest, Abdomen, Right upper leg, Left upper leg, Face, Front perineal area, Right lower leg, Left lower leg   Body parts bathed by helper: Right arm, Left arm, Buttocks, Right lower leg, Left lower leg     Bathing assist Assist Level: 2 Helpers (due to large incontinent BM beforehand)     Upper Body Dressing/Undressing Upper body dressing   What is the patient wearing?: Pull over shirt    Upper body assist Assist Level: Moderate Assistance - Patient 50 - 74%    Lower Body Dressing/Undressing Lower body dressing      What is the patient wearing?: Pants, Incontinence brief     Lower body assist Assist for lower body dressing: Maximal Assistance - Patient 25 - 49%     Toileting Toileting Toileting Activity did not occur (Clothing management and hygiene only): N/A (no void or bm)  Toileting assist Assist for toileting: 2 Helpers     Transfers Chair/bed transfer  Transfers assist     Chair/bed transfer assist level: Moderate Assistance - Patient 50 - 74% (squat pivot)     Locomotion Ambulation   Ambulation assist   Ambulation activity did not occur: Safety/medical concerns (due to patient fatigue/weakness)  Assist level: 2 helpers Assistive device: Orthosis (R hand splint) Max distance: 8   Walk 10 feet activity   Assist  Walk 10 feet activity did not occur: Safety/medical concerns (due to patient fatigue/weakness)        Walk 50 feet activity   Assist Walk 50 feet with 2 turns activity did not occur: Safety/medical concerns (due to patient fatigue/weakness)         Walk 150  feet activity   Assist Walk 150 feet activity did not occur: Safety/medical concerns (due to patient fatigue/weakness)         Walk 10 feet on uneven surface  activity   Assist Walk 10 feet on uneven surfaces activity did not occur: Safety/medical concerns (due to patient fatigue/weakness)         Wheelchair     Assist Will patient use wheelchair at  discharge?: Yes Type of Wheelchair: Manual    Wheelchair assist level: Minimal Assistance - Patient > 75% Max wheelchair distance: 55'    Wheelchair 50 feet with 2 turns activity    Assist        Assist Level: Minimal Assistance - Patient > 75%   Wheelchair 150 feet activity     Assist      Assist Level: Dependent - Patient 0%   Blood pressure (!) 116/59, pulse 90, temperature 98.4 F (36.9 C), temperature source Oral, resp. rate (!) 24, height 5' (1.524 m), weight 92 kg, SpO2 95 %.  Medical Problem List and Plan: 1.  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  2.  Antithrombotics: -DVT/anticoagulation: Lovenox             -antiplatelet therapy: Plavix 75 mg daily 3. Pain Management: Tylenol as needed.   10/25- controlled- con't regimen  10/26- taped R shoulder to help with pain- will see how does- by OT  10/30 pain appears controlled. Looks comfortable 4. Mood: Provide emotional support             -antipsychotic agents: N/A 5. Neuropsych: This patient is not capable of making decisions on her own behalf. 6. Skin/Wound Care: Routine skin checks 7. Fluids/Electrolytes/Nutrition: Routine in and outs. 8.  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  Currently with a #4 cuffless trach as of 03/28/2020. Continue PMV as tolerated. Follow-up speech therapy. Tolerating room air.  9.  Post-stroke Dysphagia.    D1 nectars  Continue tube feeds  Freewater flushes increased to 250 mL every 3 hours, has normal echo  Labs ordered for tomorrow  10/25- BUN 24- slightly elevated-   10/28- D1 thins now-   10/30 eating around 40% meals 10. Uncontrolled diabetes mellitus with hyperglycemia.  Hemoglobin A1c 10.7.  NovoLog 10 units every 4  hours, Lantus insulin 24 units twice daily.   CBG (last 3)  Recent Labs    04/15/20 2000 04/16/20 0000 04/16/20 0402  GLUCAP 142* 118* 152*   Slightly elevated on 10/24, likely to be component as well  10/25- BGs 119-160- con't regimen for now  10/26- BGs 130-185- con't regimen- might increase Lantus tomorrow if still up  10/28- BGs better 120s-160s- con't regimen  10/29- increased lantus slightly to 25 units BID  10/30- fair control. No changes 11.  Urinary retention.  Urecholine 10 mg 3 times daily.    Flomax started on 10/23            PVRs previously with retention 12.  Hyperlipidemia. Lipitor 13.  Tobacco abuse. Counseled on appropriate 14. Leukocytosis             WBCs 10.8 on 10/18, labs ordered for tomorrow  10/25- WBC 11.2- will check U/A and Cx to make sure doesn't have UTI  10/26- no UTI per  U/A results  10/28- per son and pt when asked, "she's always had a higher WBC"- might need outpt w/u but nothing to worry about for ID-wise, here.  15. Acute blood loss anemia:   Hgb 11.2 on 10/18, labs ordered for tomorrow 16. Constipation:   Improved 17. Hypotension:   10/28- BP controlled- con't regimen 18. Trach for previous resp failure  10/29-30- capped- if does well over weekend, will remove early next week  LOS: 17 days A FACE TO FACE EVALUATION WAS PERFORMED  Ranelle Oyster 04/16/2020, 10:06 AM

## 2020-04-17 ENCOUNTER — Inpatient Hospital Stay (HOSPITAL_COMMUNITY): Payer: Medicaid Other

## 2020-04-17 DIAGNOSIS — I63512 Cerebral infarction due to unspecified occlusion or stenosis of left middle cerebral artery: Secondary | ICD-10-CM | POA: Diagnosis not present

## 2020-04-17 DIAGNOSIS — I69320 Aphasia following cerebral infarction: Secondary | ICD-10-CM | POA: Diagnosis not present

## 2020-04-17 DIAGNOSIS — G8191 Hemiplegia, unspecified affecting right dominant side: Secondary | ICD-10-CM | POA: Diagnosis not present

## 2020-04-17 LAB — GLUCOSE, CAPILLARY
Glucose-Capillary: 125 mg/dL — ABNORMAL HIGH (ref 70–99)
Glucose-Capillary: 130 mg/dL — ABNORMAL HIGH (ref 70–99)
Glucose-Capillary: 140 mg/dL — ABNORMAL HIGH (ref 70–99)
Glucose-Capillary: 159 mg/dL — ABNORMAL HIGH (ref 70–99)
Glucose-Capillary: 161 mg/dL — ABNORMAL HIGH (ref 70–99)
Glucose-Capillary: 172 mg/dL — ABNORMAL HIGH (ref 70–99)
Glucose-Capillary: 97 mg/dL (ref 70–99)

## 2020-04-17 NOTE — Progress Notes (Signed)
[place] PHYSICAL MEDICINE & REHABILITATION PROGRESS NOTE   Subjective/Complaints:  Pt without issues overnight. Sleeping when I entered.   ROS: limited due to language/communication    Objective:   No results found. No results for input(s): WBC, HGB, HCT, PLT in the last 72 hours. No results for input(s): NA, K, CL, CO2, GLUCOSE, BUN, CREATININE, CALCIUM in the last 72 hours.  Intake/Output Summary (Last 24 hours) at 04/17/2020 0919 Last data filed at 04/17/2020 0745 Gross per 24 hour  Intake 280 ml  Output --  Net 280 ml        Physical Exam: Vital Signs Blood pressure (!) 110/55, pulse 79, temperature 98 F (36.7 C), resp. rate 18, height 5' (1.524 m), weight 92.6 kg, SpO2 93 %. Constitutional: No distress . Vital signs reviewed. HEENT: EOMI, oral membranes moist, NGT Neck: supple, trach Cardiovascular: RRR without murmur. No JVD    Respiratory/Chest: CTA Bilaterally without wheezes or rales. Normal effort    GI/Abdomen: BS +, non-tender, non-distended Ext: no clubbing, cyanosis, or edema Psych: pleasant and cooperative Musc: No edema in extremities.  No tenderness in extremities. Neuro: Awakens easily. Severe expressive aphasia. Follows simple commands Motor: Limited due to participation, but spontaneously moving left side, no movement noted on right side, unchanged No abnl tone   Assessment/Plan: 1. Functional deficits secondary to L MCA stroke with aphasia, and R heimplegia  which require 3+ hours per day of interdisciplinary therapy in a comprehensive inpatient rehab setting.  Physiatrist is providing close team supervision and 24 hour management of active medical problems listed below.  Physiatrist and rehab team continue to assess barriers to discharge/monitor patient progress toward functional and medical goals  Care Tool:  Bathing    Body parts bathed by patient: Chest, Abdomen, Right upper leg, Left upper leg, Face, Front perineal area, Right  lower leg, Left lower leg   Body parts bathed by helper: Right arm, Left arm, Buttocks, Right lower leg, Left lower leg     Bathing assist Assist Level: 2 Helpers (due to large incontinent BM beforehand)     Upper Body Dressing/Undressing Upper body dressing   What is the patient wearing?: Pull over shirt    Upper body assist Assist Level: Moderate Assistance - Patient 50 - 74%    Lower Body Dressing/Undressing Lower body dressing      What is the patient wearing?: Pants, Incontinence brief     Lower body assist Assist for lower body dressing: Maximal Assistance - Patient 25 - 49%     Toileting Toileting Toileting Activity did not occur (Clothing management and hygiene only): N/A (no void or bm)  Toileting assist Assist for toileting: 2 Helpers     Transfers Chair/bed transfer  Transfers assist     Chair/bed transfer assist level: Moderate Assistance - Patient 50 - 74% (squat pivot)     Locomotion Ambulation   Ambulation assist   Ambulation activity did not occur: Safety/medical concerns (due to patient fatigue/weakness)  Assist level: 2 helpers Assistive device: Orthosis (R hand splint) Max distance: 8   Walk 10 feet activity   Assist  Walk 10 feet activity did not occur: Safety/medical concerns (due to patient fatigue/weakness)        Walk 50 feet activity   Assist Walk 50 feet with 2 turns activity did not occur: Safety/medical concerns (due to patient fatigue/weakness)         Walk 150 feet activity   Assist Walk 150 feet activity did not occur: Safety/medical  concerns (due to patient fatigue/weakness)         Walk 10 feet on uneven surface  activity   Assist Walk 10 feet on uneven surfaces activity did not occur: Safety/medical concerns (due to patient fatigue/weakness)         Wheelchair     Assist Will patient use wheelchair at discharge?: Yes Type of Wheelchair: Manual    Wheelchair assist level: Minimal  Assistance - Patient > 75% Max wheelchair distance: 81'    Wheelchair 50 feet with 2 turns activity    Assist        Assist Level: Minimal Assistance - Patient > 75%   Wheelchair 150 feet activity     Assist      Assist Level: Dependent - Patient 0%   Blood pressure (!) 110/55, pulse 79, temperature 98 F (36.7 C), resp. rate 18, height 5' (1.524 m), weight 92.6 kg, SpO2 93 %.  Medical Problem List and Plan: 1.  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  2.  Antithrombotics: -DVT/anticoagulation: Lovenox             -antiplatelet therapy: Plavix 75 mg daily 3. Pain Management: Tylenol as needed.   10/25- controlled- con't regimen  10/26- taped R shoulder to help with pain- will see how does- by OT  10/30-31 pain appears controlled. Looks comfortable 4. Mood: Provide emotional support             -antipsychotic agents: N/A 5. Neuropsych: This patient is not capable of making decisions on her own behalf. 6. Skin/Wound Care: Routine skin checks 7. Fluids/Electrolytes/Nutrition: Routine in and outs. 8.  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  Currently with a #4 cuffless trach as of 03/28/2020. Continue PMV as tolerated. Follow-up speech therapy. Tolerating room air.  9.  Post-stroke Dysphagia.    D1 nectars  Continue tube feeds  Freewater flushes increased to 250 mL every 3 hours, has normal echo  Labs ordered for tomorrow  10/25- BUN 24- slightly elevated-   10/28- D1 thins now-   10/30-31 eating around 25-50% meals   -continue TF 10. Uncontrolled diabetes mellitus with hyperglycemia.  Hemoglobin A1c 10.7.  NovoLog 10 units every 4 hours, Lantus insulin 24 units twice daily.   CBG (last 3)  Recent Labs     04/17/20 0009 04/17/20 0413 04/17/20 0734  GLUCAP 130* 161* 125*   Slightly elevated on 10/24, likely to be component as well  10/25- BGs 119-160- con't regimen for now  10/26- BGs 130-185- con't regimen- might increase Lantus tomorrow if still up  10/28- BGs better 120s-160s- con't regimen  10/29- increased lantus slightly to 25 units BID  10/30-31: improving control.  Continue same plan 11.  Urinary retention.  Urecholine 10 mg 3 times daily.    Flomax started on 10/23            PVRs previously with retention 12.  Hyperlipidemia. Lipitor 13.  Tobacco abuse. Counseled on appropriate 14. Leukocytosis             WBCs 10.8 on 10/18, labs ordered for tomorrow  10/25- WBC 11.2- will check U/A and Cx to make sure doesn't have UTI  10/26- no UTI per U/A results  10/28- per son and pt when asked, "she's  always had a higher WBC"- might need outpt w/u but nothing to worry about for ID-wise, here.  15. Acute blood loss anemia:   Hgb 11.2 on 10/18, labs ordered for tomorrow 16. Constipation:   Improved 17. Hypotension:   10/31- BP controlled- con't regimen 18. Trach for previous resp failure  10/30-31--no issues, capped   -dc tomorrow potentially per primary team LOS: 18 days A FACE TO FACE EVALUATION WAS PERFORMED  Ranelle Oyster 04/17/2020, 9:19 AM

## 2020-04-17 NOTE — Progress Notes (Signed)
Physical Therapy Session Note  Patient Details  Name: Deborah [name] MRN: [id number] Date of Birth: 17-Jul-1970  Today's Date: 04/17/2020 PT Individual Time: 0800-0915 PT Individual Time Calculation (min): 75 min   Short Term Goals: Week 1:  PT Short Term Goal 1 (Week 1): Patient will maintain static sitting balance with no more than CGA for >3 mins PT Short Term Goal 1 - Progress (Week 1): Met PT Short Term Goal 2 (Week 1): Patient will require no more than MaxA x1 for bed <> wc transfer PT Short Term Goal 2 - Progress (Week 1): Met PT Short Term Goal 3 (Week 1): Patient will initiate gait training as able PT Short Term Goal 3 - Progress (Week 1): Met Week 2:  PT Short Term Goal 1 (Week 2): Patient to propel w/c with S x 70' using hemitechnique PT Short Term Goal 1 - Progress (Week 2): Progressing toward goal PT Short Term Goal 2 (Week 2): Patient to perform static standing with min A x 1 minute for functional ADL's PT Short Term Goal 2 - Progress (Week 2): Met PT Short Term Goal 3 (Week 2): Patient to ambulate with max A x 10' with LRAD. PT Short Term Goal 3 - Progress (Week 2): Progressing toward goal PT Short Term Goal 4 (Week 2): Patient to initiate stair training. PT Short Term Goal 4 - Progress (Week 2): Met Week 3:  PT Short Term Goal 1 (Week 3): Patient to ambulate 41' with mod A and LRAD PT Short Term Goal 2 (Week 3): Patient to negotiate 2 steps with mod A with rail PT Short Term Goal 3 (Week 3): Patient to propel w/c x 63' with S PT Short Term Goal 4 (Week 3): Patient to perform car transfer with mod A  Skilled Therapeutic Interventions/Progress Updates:    PAIN indicates no via nod, treatment to tolerance   Pt received long sitting in bed and agreeable to therapy session - pt able to use thumbs up/down and yes/no board to communicate during session due to aphasia.  In long sitting, pt removed shirt and donned clean shirt  w/max assist for RUE management/completion of  task. donns socks w/max assist. Long sit to short sit w/tactile facilitation on L, additional time, min assist for balance. Max assist to don pants to thighs. STS w/RW w/therapist guarding L ankle/knee and mod assist, therapist completes lower body dressing, mod assist for standing balance.  wc propulsion 65f w/mod to max assist, difficulty using LLE to assist w/steering despite heavy tactile cueing.   Transported to gym in w/c for time management and energy conservation   Worked on standing balance, cross body reaching task w/LUE w/clothespins to L, clip box to R, R hand stabilized using dysom to allow wbing during task, therapist stabilizes R knee to prevent buckling/HE during activity and allow safe wbing thru limb.  Ankle also acewrapped for DF assist w/eversion bias to prenvent inv in standing.  Pt becomes SOB , wheezing quality to respirations w/activity requring frequent rest breaks in sitting for recovery.  STS from wc to hi/lo table requres mod assist to power up and verbal cues for L hand placement, decreased eccentric control and safety w/stand to sit.    SpO2 99-100% and HR 95-96bpm w/task.     L squat pivot to/from mat/from w/c with mod assist for lifting/pivoting hips and cuing for sequencing as well as for safety awareness. In sitting, worked on sitting blance, core activation, wbing thru R  exts via reaching  task.  3 cones placed on either side of pt in 1/2  clock fashion.  R hand secured w/dysom, feet on 5in stool for support/wbing.  Pt reaches to touch cones w/LUE and returns to midline each time.  Requires cga to L, Mod assist to R for prevention of lob to R w/increasing excursions.  Rests between each trial due to sob as above.  Repeated x 4     Transported back to room and agreed to remain oob in wc.  Educated re importance of increasing time oob.   Pt left oob in wc w/alarm belt set and needs in reach  Therapy Documentation Precautions:  Precautions Precautions:  Fall Precaution Comments: NG, dense R Hemi, trach, aphasic Restrictions Weight Bearing Restrictions: No    Therapy/Group: Individual Therapy  Callie Fielding, [place] 04/17/2020, 12:42 PM

## 2020-04-17 NOTE — Progress Notes (Signed)
Unable to assess trach/ Pt is currently not in room and is doing therapy. Will assess pt a later time.

## 2020-04-18 ENCOUNTER — Inpatient Hospital Stay (HOSPITAL_COMMUNITY): Payer: Medicaid Other

## 2020-04-18 ENCOUNTER — Inpatient Hospital Stay (HOSPITAL_COMMUNITY): Payer: Medicaid Other | Admitting: Occupational Therapy

## 2020-04-18 DIAGNOSIS — I63312 Cerebral infarction due to thrombosis of left middle cerebral artery: Secondary | ICD-10-CM | POA: Diagnosis not present

## 2020-04-18 LAB — CBC WITH DIFFERENTIAL/PLATELET
Abs Immature Granulocytes: 0.12 10*3/uL — ABNORMAL HIGH (ref 0.00–0.07)
Basophils Absolute: 0.1 10*3/uL (ref 0.0–0.1)
Basophils Relative: 1 %
Eosinophils Absolute: 0.3 10*3/uL (ref 0.0–0.5)
Eosinophils Relative: 2 %
HCT: 36.8 % (ref 36.0–46.0)
Hemoglobin: 11.8 g/dL — ABNORMAL LOW (ref 12.0–15.0)
Immature Granulocytes: 1 %
Lymphocytes Relative: 20 %
Lymphs Abs: 2.7 10*3/uL (ref 0.7–4.0)
MCH: 31.4 pg (ref 26.0–34.0)
MCHC: 32.1 g/dL (ref 30.0–36.0)
MCV: 97.9 fL (ref 80.0–100.0)
Monocytes Absolute: 1 10*3/uL (ref 0.1–1.0)
Monocytes Relative: 8 %
Neutro Abs: 8.9 10*3/uL — ABNORMAL HIGH (ref 1.7–7.7)
Neutrophils Relative %: 68 %
Platelets: 382 10*3/uL (ref 150–400)
RBC: 3.76 MIL/uL — ABNORMAL LOW (ref 3.87–5.11)
RDW: 14.2 % (ref 11.5–15.5)
WBC: 13.1 10*3/uL — ABNORMAL HIGH (ref 4.0–10.5)
nRBC: 0 % (ref 0.0–0.2)

## 2020-04-18 LAB — GLUCOSE, CAPILLARY
Glucose-Capillary: 123 mg/dL — ABNORMAL HIGH (ref 70–99)
Glucose-Capillary: 140 mg/dL — ABNORMAL HIGH (ref 70–99)
Glucose-Capillary: 150 mg/dL — ABNORMAL HIGH (ref 70–99)
Glucose-Capillary: 155 mg/dL — ABNORMAL HIGH (ref 70–99)
Glucose-Capillary: 163 mg/dL — ABNORMAL HIGH (ref 70–99)
Glucose-Capillary: 170 mg/dL — ABNORMAL HIGH (ref 70–99)
Glucose-Capillary: 175 mg/dL — ABNORMAL HIGH (ref 70–99)

## 2020-04-18 LAB — BASIC METABOLIC PANEL
Anion gap: 14 (ref 5–15)
BUN: 26 mg/dL — ABNORMAL HIGH (ref 6–20)
CO2: 24 mmol/L (ref 22–32)
Calcium: 9.6 mg/dL (ref 8.9–10.3)
Chloride: 101 mmol/L (ref 98–111)
Creatinine, Ser: 0.92 mg/dL (ref 0.44–1.00)
GFR, Estimated: >60 mL/min (ref >60)
Glucose, Bld: 153 mg/dL — ABNORMAL HIGH (ref 70–99)
Potassium: 4.5 mmol/L (ref 3.5–5.1)
Sodium: 139 mmol/L (ref 135–145)

## 2020-04-18 MED ORDER — GLUCERNA 1.5 CAL PO LIQD
780.0000 mL | ORAL | Status: DC
  Administered 2020-04-18 – 2020-04-25 (×8): 780 mL
  Filled 2020-04-18 (×8): qty 948

## 2020-04-18 MED ORDER — GLUCERNA SHAKE PO LIQD
237.0000 mL | Freq: Three times a day (TID) | ORAL | Status: DC
  Administered 2020-04-18 – 2020-04-21 (×10): 237 mL via ORAL

## 2020-04-18 MED ORDER — ALBUTEROL SULFATE (2.5 MG/3ML) 0.083% IN NEBU
2.5000 mg | INHALATION_SOLUTION | RESPIRATORY_TRACT | Status: DC | PRN
  Administered 2020-04-18 – 2020-04-21 (×2): 2.5 mg via RESPIRATORY_TRACT
  Filled 2020-04-18 (×2): qty 3

## 2020-04-18 MED ORDER — ALBUTEROL SULFATE (2.5 MG/3ML) 0.083% IN NEBU
INHALATION_SOLUTION | RESPIRATORY_TRACT | Status: AC
  Administered 2020-04-18: 2.5 mg
  Filled 2020-04-18: qty 3

## 2020-04-18 NOTE — Progress Notes (Signed)
Nutrition Follow-up  RD working remotely.  DOCUMENTATION CODES:   Obesity unspecified  INTERVENTION:   Continue nocturnal tube feeding via Cortrak: - Decrease rate of Glucerna 1.5to 65 ml/hrto run from 1800 to 0600 (total of 780 ml) - Continue ProSource TF 45 ml daily -Continuefree water flushes of253ml q 3hours while awake (order per MD)  Nocturnal tube feeding regimen provides1210 kcal,75grams of protein, and564ml of H2O(67% of kcal needs, 79% of protein needs).  Total free water with flushes:2052ml  - Continue MVI with minerals daily  - Continue Magic Cup BID with meals, each supplement provides 290 kcal and 9 grams of protein  - Add Glucerna Shake po TID, each supplement provides 220 kcal and 10 grams of protein  NUTRITION DIAGNOSIS:   Inadequate oral intake related to dysphagia as evidenced by NPO status.  Progressing, pt now on dysphagia 1 diet with thin liquids  GOAL:   Patient will meet greater than or equal to 90% of their needs  Progressing  MONITOR:   PO intake, Diet advancement, Labs, Weight trends, TF tolerance, Skin, I & O's  REASON FOR ASSESSMENT:   Consult Enteral/tube feeding initiation and management  ASSESSMENT:   49 year old female with PMH of DM, HLD, and tobacco abuse. Presented on 03/08/20 with right-sided hemiparesis and aphasia. Pt was found to have left MCA CVA. Pt underwent thrombectomy per IR. Pt required intubation and underwent tracheostomy on 03/13/20. Pt is currently NPO with Cortrak in place. Admitted to CIR on 10/13.  10/15 - diet advanced to dysphagia 1 with nectar-thick liquids 10/25 - upgraded to thin liquids  Pt's target d/c date is 11/10.  Unable to speak with pt via phone call to room. Cortrak remains in place for nocturnal tube feeds. RD to decrease rate of nocturnal tube feeds to stimulate appetite and promote increased PO intake during the day. Will order Glucerna Shakes TID between meals now that pt is  on thin liquids. Will continue with Magic Cups with lunch and dinner meals.  Current weight: 89.2 kg Admit weight: 90.1 kg  Meal Completion: 25-50% x last 8 meals  Medications reviewed and include: colace, SSI q 4 hours, novolog 10 units q 4 hours, lantus 25 units BID, MVI with minerals  Labs reviewed: BUN 26 CBG's: 97-175 x 24 hours  Diet Order:   Diet Order            DIET - DYS 1 Room service appropriate? Yes; Fluid consistency: Thin  Diet effective now                 EDUCATION NEEDS:   No education needs have been identified at this time  Skin:  Skin Assessment: Reviewed RN Assessment  Last BM:  04/18/20 large type 2  Height:   Ht Readings from Last 1 Encounters:  03/31/20 5' (1.524 m)    Weight:   Wt Readings from Last 1 Encounters:  04/18/20 89.2 kg    BMI:  Body mass index is 38.41 kg/m.  Estimated Nutritional Needs:   Kcal:  1800-2000  Protein:  95-110 grams  Fluid:  >/= 1.8 L    Earma Reading, MS, RD, LDN Inpatient Clinical Dietitian Please see [facility] for contact information.

## 2020-04-18 NOTE — Progress Notes (Signed)
Speech Language Pathology Daily Session Note  Patient Details  Name: Deborah [name] MRN: [id number] Date of Birth: 1970/08/08  Today's Date: 04/18/2020 SLP Individual Time: 1003-1100 SLP Individual Time Calculation (min): 57 min  Short Term Goals: Week 3: SLP Short Term Goal 1 (Week 3): Patient will point to identify basic level verb/actions (eat, sleep, etc) in field of two with 80% accuracy. SLP Short Term Goal 2 (Week 3): Patient will manage left anterior spillage and oral pocketing of solids with modA and use of mirror for awareness. SLP Short Term Goal 3 (Week 3): Patient will imitate at multisyllabic word and functional phrase level with 75% accuracy and modA. SLP Short Term Goal 4 (Week 3): Patient will use communication board with 4-6 field choices to request basic needs/wants as well as with task specific communication boards, with minA. SLP Short Term Goal 5 (Week 3): Patient will tolerate trials of Dysphagia 2 solids with minimal difficulty managing at oral phase and minimal A cues. SLP Short Term Goal 6 (Week 3): Patient will respond to basic level yes/no questions (verbally, head nods, yes/no board pointing) with 80% accuracy.  Skilled Therapeutic Interventions:Skilled ST services focused on language skills. SLP facilitated response to yes/no questions via picture board (Yes/NO with corresponding faces), pertaining to actions in picture cards with 100% accuracy in 10 opportunities. Pt demonstrated ability to select action among two picture cards with 90% accuracy in 10 opportunities. Pt demonstrated ability to repeat verbalized actions in picture cards in 3 out 13 opportunities and nouns in 7 out 13 opportunities.  Pt demonstrated ability wrote name with initial letter given with 80% accuracy. Pt was able to write x2, 3 letter words via trace dotted lines. Pt was left in room with call bell within reach and bed/chair alarm set. SLP recommends to continue skilled services.      Pain Pain Assessment Pain Score: 0-No pain  Therapy/Group: Individual Therapy  Marshea Wisher  [facility] 04/18/2020, 3:56 PM

## 2020-04-18 NOTE — Progress Notes (Signed)
Occupational Therapy Session Note  Patient Details  Name: Deborah [name] MRN: [id number] Date of Birth: 26-Apr-1971  Today's Date: 04/18/2020 OT Individual Time: 1100-1159 OT Individual Time Calculation (min): 59 min    Short Term Goals: Week 3:  OT Short Term Goal 1 (Week 3): Pt will don pants with Mod A with sit to stand to don over hips OT Short Term Goal 2 (Week 3): Pt will perform sit to stand consistently with Min A of 1 in prep for LB ADL OT Short Term Goal 3 (Week 3): Pt will perform don shirt with hemidressing technique no more than Min A  Skilled Therapeutic Interventions/Progress Updates:    Pt greeted at time of session semireclined in bed resting but agreeable to OT session with no c/o pain, gave thumbs down when asked if she had pain. Donned socks semireclined with Mod A, able to fully don L sock but assist to don R over toes, then able to pull past heel. Supine to sit Mod A including scooting to EOB. Squat pivot to wheelchair Mod A, set up at sink and performed UB bathing Min/Mod with assist to wash LUE and hold up RUE while pt washed under arm. Lb bathing Mod A with pt able to wash thighs, calves, periarea in sitting and then buttocks in standing with Mod for sit to stand at sink level with R knee block. Mod A for standing balance while pt washed buttocks. Max A for LB dressing with pt able to thread LLE assist for RLE and in standing at sink, pt helped don over hips with more help L>R hip. Assist to brush hair d/t tangles but pt able to brush hair as well attending to both sides, assist to braid. Set up with alarm on, call bell in reach. NT and RN aware for pt to go back to bed after lunch to rest for later PT session. Note more congestion today with sats remaining 95% or higher.   Therapy Documentation Precautions:  Precautions Precautions: Fall Precaution Comments: NG, dense R Hemi, trach, aphasic Restrictions Weight Bearing Restrictions: No    Therapy/Group: Individual  Therapy  Erasmo Score 04/18/2020, 12:14 PM

## 2020-04-18 NOTE — Progress Notes (Signed)
[facility] PHYSICAL MEDICINE & REHABILITATION PROGRESS NOTE   Subjective/Complaints:  Pt reports "yes" multiple times.  Nurse and resp therapy reports pt required trach suctioning AND lavage and albuterol nebs.   And coughed her cap off trach this AM- getting new one.  sats also low at 90-92% this AM- on RA, and usually 96% and above.   Cannot remove trach at this time- also required suctioning last night.   CXR shows maybe small L pleural effusion, but nothing else- trach in right place.   WBC still 13.1- per son ALWAYS ~ 13k.    ROS: limited due to aphasia Objective:   DG CHEST PORT 1 VIEW  Result Date: 04/18/2020 CLINICAL DATA:  Hypoxia EXAM: PORTABLE CHEST 1 VIEW COMPARISON:  03/30/2020 FINDINGS: Tracheostomy in satisfactory position. Mild hazy opacity left lung base may reflect a small left pleural effusion, but this is equivocal. Right lung is clear. No frank interstitial edema. No pneumothorax. The heart is normal in size. Weighted feeding tube terminates in the gastric antrum. IMPRESSION: Possible small left pleural effusion, equivocal. No frank interstitial edema. Enteric tube terminates in the gastric antrum. Tracheostomy in satisfactory position. Electronically Signed   By: Charline Bills M.D.   On: 04/18/2020 09:04   Recent Labs    04/18/20 0803  WBC 13.1*  HGB 11.8*  HCT 36.8  PLT 382   Recent Labs    04/18/20 0803  NA 139  K 4.5  CL 101  CO2 24  GLUCOSE 153*  BUN 26*  CREATININE 0.92  CALCIUM 9.6    Intake/Output Summary (Last 24 hours) at 04/18/2020 1026 Last data filed at 04/18/2020 0827 Gross per 24 hour  Intake 40 ml  Output --  Net 40 ml        Physical Exam: Vital Signs Blood pressure (!) 114/59, pulse 89, temperature 98 F (36.7 C), resp. rate 20, height 5' (1.524 m), weight 89.2 kg, SpO2 93 %. Constitutional: No distress . Vital signs reviewed. Sitting up in bed- appropriate, sats 92% on RA via trach via PMV, NAD, bright affect- no  fever HEENT: EOMI, oral membranes moist, NGT Neck: supple, trach with PMV, not cap in place Cardiovascular: RRR no JVD  Respiratory/Chest:  A LITTLE coarse, no wheezing, no rhonchi- good air movement   GI/Abdomen: Soft, NT, ND, (+)BS  Psych: pleasant and cooperative Musc: No edema in extremities.  No tenderness in extremities. Neuro: Awakens easily. Severe expressive aphasia. Follows simple commands- kept perseverating on "yes" this AM Motor: Limited due to participation, but spontaneously moving left side, no movement noted on right side, unchanged No abnl tone   Assessment/Plan: 1. Functional deficits secondary to L MCA stroke with aphasia, and R heimplegia  which require 3+ hours per day of interdisciplinary therapy in a comprehensive inpatient rehab setting.  Physiatrist is providing close team supervision and 24 hour management of active medical problems listed below.  Physiatrist and rehab team continue to assess barriers to discharge/monitor patient progress toward functional and medical goals  Care Tool:  Bathing    Body parts bathed by patient: Chest, Abdomen, Right upper leg, Left upper leg, Face, Front perineal area, Right lower leg, Left lower leg   Body parts bathed by helper: Right arm, Left arm, Buttocks, Right lower leg, Left lower leg     Bathing assist Assist Level: 2 Helpers (due to large incontinent BM beforehand)     Upper Body Dressing/Undressing Upper body dressing   What is the patient wearing?: Pull over  shirt    Upper body assist Assist Level: Moderate Assistance - Patient 50 - 74%    Lower Body Dressing/Undressing Lower body dressing      What is the patient wearing?: Pants, Incontinence brief     Lower body assist Assist for lower body dressing: Maximal Assistance - Patient 25 - 49%     Toileting Toileting Toileting Activity did not occur (Clothing management and hygiene only): N/A (no void or bm)  Toileting assist Assist for toileting: 2  Helpers     Transfers Chair/bed transfer  Transfers assist     Chair/bed transfer assist level: Moderate Assistance - Patient 50 - 74% (squat pivot)     Locomotion Ambulation   Ambulation assist   Ambulation activity did not occur: Safety/medical concerns (due to patient fatigue/weakness)  Assist level: 2 helpers Assistive device: Orthosis (R hand splint) Max distance: 8   Walk 10 feet activity   Assist  Walk 10 feet activity did not occur: Safety/medical concerns (due to patient fatigue/weakness)        Walk 50 feet activity   Assist Walk 50 feet with 2 turns activity did not occur: Safety/medical concerns (due to patient fatigue/weakness)         Walk 150 feet activity   Assist Walk 150 feet activity did not occur: Safety/medical concerns (due to patient fatigue/weakness)         Walk 10 feet on uneven surface  activity   Assist Walk 10 feet on uneven surfaces activity did not occur: Safety/medical concerns (due to patient fatigue/weakness)         Wheelchair     Assist Will patient use wheelchair at discharge?: Yes Type of Wheelchair: Manual    Wheelchair assist level: Minimal Assistance - Patient > 75% Max wheelchair distance: 65'    Wheelchair 50 feet with 2 turns activity    Assist        Assist Level: Minimal Assistance - Patient > 75%   Wheelchair 150 feet activity     Assist      Assist Level: Dependent - Patient 0%   Blood pressure (!) 114/59, pulse 89, temperature 98 F (36.7 C), resp. rate 20, height 5' (1.524 m), weight 89.2 kg, SpO2 93 %.  Medical Problem List and Plan: 1.  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  2.   Antithrombotics: -DVT/anticoagulation: Lovenox             -antiplatelet therapy: Plavix 75 mg daily 3. Pain Management: Tylenol as needed.   10/25- controlled- con't regimen  10/26- taped R shoulder to help with pain- will see how does- by OT  10/30-31 pain appears controlled. Looks comfortable 4. Mood: Provide emotional support             -antipsychotic agents: N/A 5. Neuropsych: This patient is not capable of making decisions on her own behalf. 6. Skin/Wound Care: Routine skin checks 7. Fluids/Electrolytes/Nutrition: Routine in and outs. 8.  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  Currently with a #4 cuffless trach as of 03/28/2020. Continue PMV as tolerated. Follow-up speech therapy. Tolerating room air.  9.  Post-stroke Dysphagia.    D1 nectars  Continue tube feeds  Freewater flushes increased to 250 mL every 3 hours, has normal echo  Labs ordered for tomorrow  10/25- BUN 24- slightly elevated-   10/28- D1 thins now-   10/30-31 eating around 25-50% meals   -continue TF 10. Uncontrolled diabetes mellitus with hyperglycemia.  Hemoglobin A1c 10.7.  NovoLog 10 units every 4 hours, Lantus insulin 24 units twice daily.   CBG (last 3)  Recent Labs    04/18/20 0013 04/18/20 0404 04/18/20 0804  GLUCAP 140* 175* 150*   Slightly elevated on 10/24, likely to be component as well  10/25- BGs 119-160- con't regimen for now  10/26- BGs 130-185- con't regimen- might increase Lantus tomorrow if still up  10/28- BGs better 120s-160s- con't regimen  10/29- increased lantus slightly to 25 units BID  10/30-31: improving control.  Continue same plan  11/1- BG controlled- con't regimen 11.  Urinary retention.  Urecholine 10 mg 3 times daily.    Flomax started on 10/23            PVRs previously with retention 12.  Hyperlipidemia. Lipitor 13.  Tobacco abuse. Counseled on appropriate 14. Leukocytosis             WBCs 10.8 on 10/18, labs ordered for tomorrow  10/25- WBC 11.2- will check U/A and Cx to make  sure doesn't have UTI  10/26- no UTI per U/A results  10/28- per son and pt when asked, "she's always had a higher WBC"- might need outpt w/u but nothing to worry about for ID-wise, here.  15. Acute blood loss anemia:   Hgb 11.2 on 10/18, labs ordered for tomorrow 16. Constipation:   Improved 17. Hypotension:   10/31- BP controlled- con't regimen 18. Trach for previous resp failure  10/30-31--no issues, capped   -dc tomorrow potentially per primary team  11/1- had lower sats 90-92%- might have been mucus plug that resp got out? CXR shows nothing specific- MAYBE small L pleural effusion.  No fever, no increase in WBC above baseline. Will NOT take out trach, like planned and will put cap back on and monitor  LOS: 19 days A FACE TO FACE EVALUATION WAS PERFORMED  Jakeim Sedore 04/18/2020, 10:26 AM

## 2020-04-18 NOTE — Progress Notes (Signed)
Physical Therapy Session Note  Patient Details  Name: Deborah [name] MRN: [id number] Date of Birth: 08-22-1970  Today's Date: 04/18/2020 PT Individual Time: 1300-1411 PT Individual Time Calculation (min): 71 min   Short Term Goals: Week 3:  PT Short Term Goal 1 (Week 3): Patient to ambulate 46' with mod A and LRAD PT Short Term Goal 2 (Week 3): Patient to negotiate 2 steps with mod A with rail PT Short Term Goal 3 (Week 3): Patient to propel w/c x 14' with S PT Short Term Goal 4 (Week 3): Patient to perform car transfer with mod A  Skilled Therapeutic Interventions/Progress Updates:    Patient up in w/c and assisted to dayroom in w/c.  Applied harness for litegait and pt sit to stand mod A.  Stepping up onto treadmill +2 A and pt sat to rest.  Stood and able to ambulate 14' with +2 A for safety with total A for R LE progression and stance stabilization and max cues due to pt hopping on R foot.  Rested in chair and attempted second bout with R hand wrapped on handle.  Patient immediately c/o pain and difficulty managing R foot and weight shift after about 6' sat to rest and remove hand from handle.  Spot checking SpO2 throughout session 97% and HR max 115.  Stood and stepping back with max A for R foot management to return to w/c and remove harness.  Placed platform on RW and wrapped R foot with ace wrap for DF assist pt sit to stand and ambulated about 3' with +2 A pt not progressing R LE and not attending to weight shifts.  Used wall railing with +2 A for ambulation about 8' with A for R LE management.  Patient propelled w/c 53' with min A & max cues for technique for L foot to steer and for R side awareness.  Patient assisted to room in w/c and stand pivot to bed mod A. Sit to supine min A for R LE.  Left with call bell in reach and bed alarm activated, RN aware.   Therapy Documentation Precautions:  Precautions Precautions: Fall Precaution Comments: NG, dense R Hemi, trach,  aphasic Restrictions Weight Bearing Restrictions: No Pain: Pain Assessment Pain Score: 0-No pain Faces Pain Scale: Hurts even more Pain Location: Arm Pain Orientation: Right Pain Descriptors / Indicators: Discomfort;Grimacing;Guarding Pain Onset: With Activity Pain Intervention(s): Repositioned;Elevated extremity    Therapy/Group: Individual Therapy  [name]  [name], PT 04/18/2020, 6:34 PM

## 2020-04-19 ENCOUNTER — Inpatient Hospital Stay (HOSPITAL_COMMUNITY): Payer: Medicaid Other | Admitting: Occupational Therapy

## 2020-04-19 ENCOUNTER — Inpatient Hospital Stay (HOSPITAL_COMMUNITY): Payer: Medicaid Other

## 2020-04-19 ENCOUNTER — Inpatient Hospital Stay (HOSPITAL_COMMUNITY): Payer: Medicaid Other | Admitting: Physical Therapy

## 2020-04-19 DIAGNOSIS — I63312 Cerebral infarction due to thrombosis of left middle cerebral artery: Secondary | ICD-10-CM | POA: Diagnosis not present

## 2020-04-19 LAB — GLUCOSE, CAPILLARY
Glucose-Capillary: 101 mg/dL — ABNORMAL HIGH (ref 70–99)
Glucose-Capillary: 112 mg/dL — ABNORMAL HIGH (ref 70–99)
Glucose-Capillary: 120 mg/dL — ABNORMAL HIGH (ref 70–99)
Glucose-Capillary: 123 mg/dL — ABNORMAL HIGH (ref 70–99)
Glucose-Capillary: 127 mg/dL — ABNORMAL HIGH (ref 70–99)
Glucose-Capillary: 130 mg/dL — ABNORMAL HIGH (ref 70–99)
Glucose-Capillary: 148 mg/dL — ABNORMAL HIGH (ref 70–99)
Glucose-Capillary: 169 mg/dL — ABNORMAL HIGH (ref 70–99)

## 2020-04-19 MED ORDER — INFLUENZA VAC SPLIT QUAD 0.5 ML IM SUSY: 0.5000 mL | PREFILLED_SYRINGE | INTRAMUSCULAR | Status: DC | PRN

## 2020-04-19 MED ORDER — PNEUMOCOCCAL VAC POLYVALENT 25 MCG/0.5ML IJ INJ: 0.5000 mL | INJECTION | INTRAMUSCULAR | Status: DC | PRN

## 2020-04-19 NOTE — Progress Notes (Signed)
Patient ID: Deborah [name], female   DOB: 01-09-1971, 49 y.o.   MRN: [id number]  Family education scheduled Friday 04/22/2020 1-3 PM

## 2020-04-19 NOTE — Progress Notes (Signed)
Patient ID: Deborah [name], female   DOB: [birth year], 49 y.o.   MRN: [id number] Team Conference Report to Patient/Family  Team Conference discussion was reviewed with the patient and caregiver, including goals, any changes in plan of care and target discharge date.  Patient and caregiver express understanding and are in agreement.  The patient has a target discharge date of 04/27/20.  Andria Rhein 04/19/2020, 12:31 PM

## 2020-04-19 NOTE — Progress Notes (Signed)
[facility] PHYSICAL MEDICINE & REHABILITATION PROGRESS NOTE   Subjective/Complaints:   Pt says feels fine, but then pointed to yes, when said did she feel SOB, however,  Couldn't get her to ever point to no, for any reason, so not sure if she actually felt SOB.   sats 93% on RA this AM.   Per Morrie [name], her nurse, she is poor historian.   NTZ:GYFVCBS due to aphasia   Objective:   DG CHEST PORT 1 VIEW  Result Date: 04/18/2020 CLINICAL DATA:  Hypoxia EXAM: PORTABLE CHEST 1 VIEW COMPARISON:  03/30/2020 FINDINGS: Tracheostomy in satisfactory position. Mild hazy opacity left lung base may reflect a small left pleural effusion, but this is equivocal. Right lung is clear. No frank interstitial edema. No pneumothorax. The heart is normal in size. Weighted feeding tube terminates in the gastric antrum. IMPRESSION: Possible small left pleural effusion, equivocal. No frank interstitial edema. Enteric tube terminates in the gastric antrum. Tracheostomy in satisfactory position. Electronically Signed   By: Charline Bills M.D.   On: 04/18/2020 09:04   Recent Labs    04/18/20 0803  WBC 13.1*  HGB 11.8*  HCT 36.8  PLT 382   Recent Labs    04/18/20 0803  NA 139  K 4.5  CL 101  CO2 24  GLUCOSE 153*  BUN 26*  CREATININE 0.92  CALCIUM 9.6    Intake/Output Summary (Last 24 hours) at 04/19/2020 0959 Last data filed at 04/19/2020 0742 Gross per 24 hour  Intake 140 ml  Output --  Net 140 ml        Physical Exam: Vital Signs Blood pressure (!) 109/47, pulse 96, temperature 98.2 F (36.8 C), temperature source Oral, resp. rate 18, height 5' (1.524 m), weight 89.4 kg, SpO2 94 %. Constitutional: No distress . Vital signs reviewed. Sitting up in bed; appropriate, has yes/no signs in front of her, nurse at bedside, NAD HEENT: EOMI, oral membranes moist, NGT in place Neck: supple, trach with cap in place Cardiovascular:RRR Respiratory/Chest:  A little coarse, mainly upper airway sounds,  good air movement, no W/R/R GI/Abdomen: Soft, NT, ND, (+)BS   Psych: pleasant and cooperative- very aphasic- garbled speech- couldn't understand.  Musc: No edema in extremities.  No tenderness in extremities. Neuro: Awakens easily. Severe expressive aphasia. Follows simple commands- kept perseverating on "yes" this AM Motor: Limited due to participation, but spontaneously moving left side, no movement noted on right side, unchanged No abnl tone   Assessment/Plan: 1. Functional deficits secondary to L MCA stroke with aphasia, and R heimplegia  which require 3+ hours per day of interdisciplinary therapy in a comprehensive inpatient rehab setting.  Physiatrist is providing close team supervision and 24 hour management of active medical problems listed below.  Physiatrist and rehab team continue to assess barriers to discharge/monitor patient progress toward functional and medical goals  Care Tool:  Bathing    Body parts bathed by patient: Chest, Abdomen, Right upper leg, Left upper leg, Face, Front perineal area, Right lower leg, Left lower leg, Right arm   Body parts bathed by helper: Left arm, Buttocks, Right lower leg, Left lower leg     Bathing assist Assist Level: Moderate Assistance - Patient 50 - 74%     Upper Body Dressing/Undressing Upper body dressing   What is the patient wearing?: Pull over shirt    Upper body assist Assist Level: Moderate Assistance - Patient 50 - 74%    Lower Body Dressing/Undressing Lower body dressing  What is the patient wearing?: Pants, Incontinence brief     Lower body assist Assist for lower body dressing: Maximal Assistance - Patient 25 - 49%     Toileting Toileting Toileting Activity did not occur (Clothing management and hygiene only): N/A (no void or bm)  Toileting assist Assist for toileting: 2 Helpers     Transfers Chair/bed transfer  Transfers assist     Chair/bed transfer assist level: Moderate Assistance - Patient  50 - 74%     Locomotion Ambulation   Ambulation assist   Ambulation activity did not occur: Safety/medical concerns (due to patient fatigue/weakness)  Assist level: 2 helpers Assistive device: Lite Gait Max distance: 14   Walk 10 feet activity   Assist  Walk 10 feet activity did not occur: Safety/medical concerns (due to patient fatigue/weakness)  Assist level: 2 helpers Assistive device: Lite Gait   Walk 50 feet activity   Assist Walk 50 feet with 2 turns activity did not occur: Safety/medical concerns (due to patient fatigue/weakness)         Walk 150 feet activity   Assist Walk 150 feet activity did not occur: Safety/medical concerns (due to patient fatigue/weakness)         Walk 10 feet on uneven surface  activity   Assist Walk 10 feet on uneven surfaces activity did not occur: Safety/medical concerns (due to patient fatigue/weakness)         Wheelchair     Assist Will patient use wheelchair at discharge?: Yes Type of Wheelchair: Manual    Wheelchair assist level: Minimal Assistance - Patient > 75% Max wheelchair distance: 75    Wheelchair 50 feet with 2 turns activity    Assist        Assist Level: Minimal Assistance - Patient > 75%   Wheelchair 150 feet activity     Assist      Assist Level: Dependent - Patient 0%   Blood pressure (!) 109/47, pulse 96, temperature 98.2 F (36.8 C), temperature source Oral, resp. rate 18, height 5' (1.524 m), weight 89.4 kg, SpO2 94 %.  Medical Problem List and Plan: 1.  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  2.  Antithrombotics: -DVT/anticoagulation: Lovenox             -antiplatelet therapy: Plavix 75 mg daily 3.  Pain Management: Tylenol as needed.   10/25- controlled- con't regimen  10/26- taped R shoulder to help with pain- will see how does- by OT  11/2- no TTP over R shoulder now- con't tylenol prn 4. Mood: Provide emotional support             -antipsychotic agents: N/A 5. Neuropsych: This patient is not capable of making decisions on her own behalf. 6. Skin/Wound Care: Routine skin checks 7. Fluids/Electrolytes/Nutrition: Routine in and outs. 8.  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  Currently with a #4 cuffless trach as of 03/28/2020. Continue PMV as tolerated. Follow-up speech therapy. Tolerating room air.  9.  Post-stroke Dysphagia.    D1 nectars initially- now D1 thins  Continue tube feeds  Freewater flushes increased to 250 mL every 3 hours, has normal echo  Labs ordered for tomorrow  10/25- BUN 24- slightly elevated-   10/28- D1 thins now-   10/30-31  eating around 25-50% meals   -continue TF  11/2- con't TFs- still a little dry- esp needs water- will call family about possible PEG< since cannot go home with NGT.  10. Uncontrolled diabetes mellitus with hyperglycemia.  Hemoglobin A1c 10.7.  NovoLog 10 units every 4 hours, Lantus insulin 24 units twice daily.   CBG (last 3)  Recent Labs    04/19/20 0004 04/19/20 0409 04/19/20 0749  GLUCAP 101* 123* 169*   Slightly elevated on 10/24, likely to be component as well  10/25- BGs 119-160- con't regimen for now  10/26- BGs 130-185- con't regimen- might increase Lantus tomorrow if still up  11/2- BGs overall stable- co't regimen 11.  Urinary retention.  Urecholine 10 mg 3 times daily.    Flomax started on 10/23            PVRs previously with retention 12.  Hyperlipidemia. Lipitor 13.  Tobacco abuse. Counseled on appropriate 14. Leukocytosis             WBCs 10.8 on 10/18, labs ordered for tomorrow  10/25- WBC 11.2- will check U/A and Cx to make sure doesn't have UTI  10/26- no UTI per U/A results  10/28- per son and pt when asked, "she's  always had a higher WBC"- might need outpt w/u but nothing to worry about for ID-wise, here.  15. Acute blood loss anemia:   Hgb 11.2 on 10/18, labs ordered for tomorrow 16. Constipation:   Improved 17. Hypotension:   10/31- BP controlled- con't regimen 18. Trach for previous resp failure  10/30-31--no issues, capped   -dc tomorrow potentially per primary team  11/1- had lower sats 90-92%- might have been mucus plug that resp got out? CXR shows nothing specific- MAYBE small L pleural effusion.  No fever, no increase in WBC above baseline. Will NOT take out trach, like planned and will put cap back on and monitor  11/2- wait to remove trach- sats 93% RA- but used to be 96% and above.  19. Dispo  11/2- will call family about PEG- because not eating well.    LOS: 20 days A FACE TO FACE EVALUATION WAS PERFORMED  Everitt Wenner 04/19/2020, 9:59 AM

## 2020-04-19 NOTE — Progress Notes (Signed)
Physical Therapy Session Note  Patient Details  Name: Deborah [name] MRN: [id number] Date of Birth: 06-Jan-1971  Today's Date: 04/19/2020 PT Individual Time: 1315-1410 PT Individual Time Calculation (min): 55 min   Short Term Goals: Week 3:  PT Short Term Goal 1 (Week 3): Patient to ambulate 12' with mod A and LRAD PT Short Term Goal 2 (Week 3): Patient to negotiate 2 steps with mod A with rail PT Short Term Goal 3 (Week 3): Patient to propel w/c x 81' with S PT Short Term Goal 4 (Week 3): Patient to perform car transfer with mod A  Skilled Therapeutic Interventions/Progress Updates:    Patient in supine, eager for OOB.  Min A for R LE supine to sit.  Transferred to w/c mod A.  Donned shoes total A.  Assisted in w/c to gym for time management.  Patient sit to stand to PFRW and ambulated 3 steps +2 A then LOB due to poor R LE positioning and needing to sit.  Attempted with hemiwalker and +2 A pt immediately moaning and fearful upon standing with HW took only 2 steps then assisted to sit.  Patient with PFRW able to ambulate about 7' with +2 A pt able to progress R LE and A for stance stabilization.  Standing at hi low table to play checkers x 3 bouts about 2 min each able to balance with A for R LE stability and move checkers with LUE and min/mod A with 1 LOB and A to lower into chair.  Patient assisted in chair to room and requesting to toilet.  Assisted to transfer to toilet mod A with grabbar and toileted CGA for hygiene.  Changed brief total A.  Assisted to w/c mod A then to bed mod A transfer and min A for R LE to supine.  Left with needs in reach and bed alarm activated.   Therapy Documentation Precautions:  Precautions Precautions: Fall Precaution Comments: NG, dense R Hemi, trach, aphasic Restrictions Weight Bearing Restrictions: No Pain: Pain Assessment Pain Score: 0-No pain    Therapy/Group: Individual Therapy  [name]  [name], [place] 04/19/2020, 5:28 PM

## 2020-04-19 NOTE — Progress Notes (Signed)
Speech Language Pathology Daily Session Note  Patient Details  Name: Deborah [name] MRN: [id number] Date of Birth: 1971-03-08  Today's Date: 04/19/2020 SLP Individual Time: 5462-7035 SLP Individual Time Calculation (min): 40 min  Short Term Goals: Week 3: SLP Short Term Goal 1 (Week 3): Patient will point to identify basic level verb/actions (eat, sleep, etc) in field of two with 80% accuracy. SLP Short Term Goal 2 (Week 3): Patient will manage left anterior spillage and oral pocketing of solids with modA and use of mirror for awareness. SLP Short Term Goal 3 (Week 3): Patient will imitate at multisyllabic word and functional phrase level with 75% accuracy and modA. SLP Short Term Goal 4 (Week 3): Patient will use communication board with 4-6 field choices to request basic needs/wants as well as with task specific communication boards, with minA. SLP Short Term Goal 5 (Week 3): Patient will tolerate trials of Dysphagia 2 solids with minimal difficulty managing at oral phase and minimal A cues. SLP Short Term Goal 6 (Week 3): Patient will respond to basic level yes/no questions (verbally, head nods, yes/no board pointing) with 80% accuracy.  Skilled Therapeutic Interventions:SLP targeted swallow and communication skills. Pt demonstrated increase inconsistency in response to yes/no questions today compared to yesterday's session. When SLP asked if she had eaten lunch given yes/no board pt pointed to yes, but eventually pointed to no when increasing cuing and repeat questioning. Pt only verbalized "milk" in response to the yes/no question with no awareness of verbalized error. SLP facilitated PO consumption of dys 1 textures and thin liquids via straw lunch tray and encouraged PO consumption based on nursing reports on limited consumption and MD consideration of PEG tube placement. Pt consumed dys 1 textures with tray set up, independently self-feeding. Pt demonstrated improved oral control on dys 1  textures with appropriate oral clearance and mild anterior right spillage. Pt occasionally required assistance picking up cup to drink thin via straw, but demonstrated no anterior spillage nor overt s/s aspiration. Pt did demonstrated inconsistent deep breaths, attempting to catch breath with possibly a stridor sound noted while consuming lunch tray. SLP notified nurse, however sound was not observed in nurse's presents and O2 stats remained 99-96. Pt continued to demonstrate inconsistent response to yes/no questions when asked about vanilla verse chocolate pudding preference, demonstrating 60% accuracy. Pt consumed limited dys 2 textured snack with increase oral control, no anterior spillage noted and appropriate oral clearance, but continued to demonstrate reduced bolus cohesion and prolonged mastication. SLP recommends to continue current diet of dys 1 textures and thin liquids with trials of dys 2 textures with SLP only. SLP requested NT to finish supervision and encourage continued consumption of lunch tray. Pt was left with call bell within reach and chair alarm set. SLP expressed concerns with PO intake meeting nutritional needs to MD. Recommend to continue ST services.  Pain Pain Assessment Pain Score: 0-No pain  Therapy/Group: Individual Therapy  Deborah [name]  [facility] 04/19/2020, 3:48 PM

## 2020-04-19 NOTE — Progress Notes (Signed)
Patient ID: Deborah [name], female   DOB: [birth year], 49 y.o.   MRN: [id number]  SW attempted to call pt son to provide team conference updates. No answer, left voicemail.  [place], [place] [phone]

## 2020-04-19 NOTE — Progress Notes (Signed)
Pt at therapy at this time and unavailable for a trach check.

## 2020-04-19 NOTE — Progress Notes (Signed)
Occupational Therapy Session Note  Patient Details  Name: Deborah [name] MRN: [id number] Date of Birth: 1971-03-25  Today's Date: 04/19/2020 OT Individual Time: 0867-6195 OT Individual Time Calculation (min): 70 min    Short Term Goals: Week 3:  OT Short Term Goal 1 (Week 3): Pt will don pants with Mod A with sit to stand to don over hips OT Short Term Goal 2 (Week 3): Pt will perform sit to stand consistently with Min A of 1 in prep for LB ADL OT Short Term Goal 3 (Week 3): Pt will perform don shirt with hemidressing technique no more than Min A  Skilled Therapeutic Interventions/Progress Updates:    Pt greeted at time of session supine in bed saying "dirty" and when asked if she needed to change brief, gave thumbs up. No c/o pain via simple yes/no and thumbs up/down. Supine to sit Mod A, Squat pivot bed > wheelchair Mod but almost Min A, pt needing less assist compared to previous sessions. Set up at sink level and doffed shirt with cues for doffing overhead first, then arms. Doffed soiled brief in standing at sink level with Max A to doff d/t moisture and Max A for hygiene to wash buttocks d/t BM. After majority of BM had been cleaned, pt able to assist with final portion of washing buttocks in standing with using LUE. UB dress Mod A with hemidressing techniques and max verbal cues. LB dress Max for donning brief and almost Mod for pants, donned brief as underwear already assembled, sit to stand Mod at sink with R knee block pt able to help don over L hip, assist with R hip. Donned pants in same manner, able to thread LLE and assist with RLE. Pt agreeable to practice toilet transfers even though she didn't need to use, squat pivot to toilet with no BSC as pt has expressed in the past she did not like it with Mod A with use of grab bar. Heavy Mod/Max back to wheelchair to her R side, plan to continue to practice. AAROM/PROM with pt seated to RUE at shoulder, elbow, forearm to promote ROM and NMR.  Set up in chair with alarm on, call bell in reach, half lap tray in place.   Therapy Documentation Precautions:  Precautions Precautions: Fall Precaution Comments: NG, dense R Hemi, trach, aphasic Restrictions Weight Bearing Restrictions: No    Therapy/Group: Individual Therapy  Erasmo Score 04/19/2020, 11:27 AM

## 2020-04-19 NOTE — Progress Notes (Signed)
Physical Therapy Session Note  Patient Details  Name: Deborah [name] MRN: [id number] Date of Birth: 06-21-1970  Today's Date: 04/19/2020 PT Individual Time: 1016-1100 PT Individual Time Calculation (min): 44 min   Short Term Goals: Week 1:  PT Short Term Goal 1 (Week 1): Patient will maintain static sitting balance with no more than CGA for >3 mins PT Short Term Goal 1 - Progress (Week 1): Met PT Short Term Goal 2 (Week 1): Patient will require no more than MaxA x1 for bed <> wc transfer PT Short Term Goal 2 - Progress (Week 1): Met PT Short Term Goal 3 (Week 1): Patient will initiate gait training as able PT Short Term Goal 3 - Progress (Week 1): Met Week 2:  PT Short Term Goal 1 (Week 2): Patient to propel w/c with S x 70' using hemitechnique PT Short Term Goal 1 - Progress (Week 2): Progressing toward goal PT Short Term Goal 2 (Week 2): Patient to perform static standing with min A x 1 minute for functional ADL's PT Short Term Goal 2 - Progress (Week 2): Met PT Short Term Goal 3 (Week 2): Patient to ambulate with max A x 10' with LRAD. PT Short Term Goal 3 - Progress (Week 2): Progressing toward goal PT Short Term Goal 4 (Week 2): Patient to initiate stair training. PT Short Term Goal 4 - Progress (Week 2): Met  Skilled Therapeutic Interventions/Progress Updates:    pt received in [facility] and agreeable to therapy. Pt taken to gym in [facility] total A and directed in 2x10 Sit to stand at Steady with max A to achieve initial stance from [facility] and min A for steady seat with manual facilitation for knee extension and glute activation and trunk extension. Pt directed in static standing initially to achieve midline and centered position; 3x10 weight shifts left<>right for improved weight bearing on Right lower extremity while maintaining posture, 2x10 mini squats to steady seat to attempt to promote improved activation of Right lower extremity however limited noted. Pt returned to sitting in WC, 3x10 then  directed in forward reaching in various directions with left upper extremity to assist right upper extremity for improved trunk support and stability, initially mod A for assistance with R UE however improved to CGA overall. Pt returned to room in [facility], left in [facility], alarm set, All needs in reach and in good condition. Call light in hand.    Therapy Documentation Precautions:  Precautions Precautions: Fall Precaution Comments: NG, dense R Hemi, trach, aphasic Restrictions Weight Bearing Restrictions: No General:   Vital Signs: Therapy Vitals Pulse Rate: 94 Resp: 18 Patient Position (if appropriate): Sitting Oxygen Therapy SpO2: (!) 89 % O2 Device: Room Air    Therapy/Group: Individual Therapy  [name] 04/19/2020, 12:38 PM

## 2020-04-19 NOTE — Plan of Care (Signed)
  Problem: Consults Goal: RH STROKE PATIENT EDUCATION Description: See Patient Education module for education specifics  Outcome: Progressing Goal: Diabetes Guidelines if Diabetic/Glucose > 140 Description: If diabetic or lab glucose is > 140 mg/dl - Initiate Diabetes/Hyperglycemia Guidelines & Document Interventions  Outcome: Progressing   Problem: RH BOWEL ELIMINATION Goal: RH STG MANAGE BOWEL WITH ASSISTANCE Description: STG Manage Bowel with min to mod Assistance. Outcome: Progressing Goal: RH STG MANAGE BOWEL W/MEDICATION W/ASSISTANCE Description: STG Manage Bowel with Medication with min to mod Assistance. Outcome: Progressing   Problem: RH BLADDER ELIMINATION Goal: RH STG MANAGE BLADDER WITH ASSISTANCE Description: STG Manage Bladder With min to mod Assistance Outcome: Progressing Goal: RH STG MANAGE BLADDER WITH MEDICATION WITH ASSISTANCE Description: STG Manage Bladder With Medication With  min to mod Assistance. Outcome: Progressing   Problem: RH SKIN INTEGRITY Goal: RH STG MAINTAIN SKIN INTEGRITY WITH ASSISTANCE Description: STG Maintain Skin Integrity With min to mod Assistance. Outcome: Progressing Goal: RH STG ABLE TO PERFORM INCISION/WOUND CARE W/ASSISTANCE Description: STG Able To Perform Incision/Wound Care With  min to mod Assistance. Outcome: Progressing   Problem: RH SAFETY Goal: RH STG ADHERE TO SAFETY PRECAUTIONS W/ASSISTANCE/DEVICE Description: STG Adhere to Safety Precautions With min to mod Assistance/Device. Outcome: Progressing   Problem: RH COGNITION-NURSING Goal: RH STG ANTICIPATES NEEDS/CALLS FOR ASSIST W/ASSIST/CUES Description: STG Anticipates Needs/Calls for Assist With min to mod Assistance and Cues. Outcome: Progressing   Problem: RH PAIN MANAGEMENT Goal: RH STG PAIN MANAGED AT OR BELOW PT'S PAIN GOAL Description: <3 on a 0-10 pain scale. Outcome: Progressing   Problem: RH KNOWLEDGE DEFICIT Goal: RH STG INCREASE KNOWLEDGE OF  DIABETES Description: Patient will demonstrate knowledge of diabetes management, medication management, blood sugar control, and follow up care with the MD with min to mod assist from CIR staff. Outcome: Progressing Goal: RH STG INCREASE KNOWLEDGE OF STROKE PROPHYLAXIS Description: Patient will be able to demonstrate knowledge on medications used and taken to aid in prevention of future strokes with min to mod assist from CIR staff. Outcome: Progressing

## 2020-04-19 NOTE — Progress Notes (Signed)
Patient ID: Deborah [name], female   DOB: August 27, 1970, 50 y.o.   MRN: [id number]  SW attempted to call pt son to provide team conference updates. No answer, voicemail full and unable to accept messages.  [place], [place] [phone]

## 2020-04-19 NOTE — Patient Care Conference (Signed)
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 Genice Rouge Care Coodinator Present: Lavera Guise, BSW;Kinze Labo Marlyne Beards, RN, BSN, CRRN Nurse Present: Greta Doom, RN PT Present: [name], PT OT Present: Earleen Newport, OT SLP Present: Colin Benton, SLP PPS Coordinator present : Fae Pippin, [place], PT     Current Status/Progress Goal Weekly Team Focus  Bowel/Bladder   Pt is incontinent of B&B  Pt will paticipate in bowel and bladder program until she regains continent.  Assess Pt for incontinence q shift or as needed.   Swallow/Nutrition/ Hydration   dys 1 textures and thin liquids, dys 2 textures trials with SLP  Mod A  tolerance of deit and trials of dys 2 textures   ADL's   Min/Mod UB bathe, Max (almost Mod) LB dress in standing with R knee block, Mod sit to stands, Mod squat pivots, inconsistent toileting, improved communication  Min-Mod A overall  toileting tasks/toilet transfers, determining AE/DME, standing balance, sit to stands, RUE NMR, LB ADLs   Mobility   mod A  transfers bed/chair and sit to stand, gait x 8' along wall rail +2 A; working to find safest AD, w/c mobility min A x 70', S to CGA dynamic sitting balance, min A static standing balance w/ UE support, mod A dynamic.  CGA transfers, gait 50' min A, 8 steps min A, w/c min A (will be downgraded mod A gait likely 15' and min A transfers)      Communication   Max A word level, Min-Sup response to yes/no questions with communication board, can select actions given two choices  mod A basic  communication board, selecting actions given pictures, imitating functional phrases   Safety/Cognition/ Behavioral Observations  max-mod A, can use call bell  modA  basic problem solving in functional tasks   Pain   No C/O pain  Pt will remain pain free  Assess pt for pain q shift and as needed   Skin   Skin is intact, no rash or breakdown  Skin will remain intact  Assess for rash or breakdown q shift or as needed.     Discharge Planning:  Discharging home with significant other and son. NO DME. 1 Level home (7 steps to enter)   Team Discussion: Better today, O2 sats are better, requiring no suction. CBG's 120-160, do need an order to check CBG's. No complaints of pain. OT reports patient is mod assist for upper body, max assist for lower body ADL's but is helping more. Able to say "yeah" but at same time will put thumb down. PT reports patient is mod  assist for bed mobility, not attending to the right leg, has right inattention which makes W/C mobility difficult. SLP reports patient is on Dys 1, there is a picture board in the room which helps her answer questions. She is able to use the call bell. Patient on target to meet rehab goals: yes  *See Care Plan and progress notes for long and short-term goals.   Revisions to Treatment Plan:  MD states she may need a PEG but will wait til 04/20/20 to make decision. Not ready to remove trach at this time.  Teaching Needs: Continue family education, trach care  education.  Current Barriers to Discharge: Decreased caregiver support, Medical stability, Home enviroment access/layout, Trach, Weight bearing restrictions, Medication compliance and Nutritional means  Possible Resolutions to Barriers: PEG decision still pending, continue current medications, educate trach care, education on safety awareness, and communication with the communication board.     Medical Summary Current Status: Trach- sats 93%- up to 96-97% now; no suctioning today; incontnent B/B- LBM today; BGs 120-160s; no pain; no wounds  Barriers to Discharge: Behavior;Decreased family/caregiver support;Home enviroment access/layout;Other (comments);Weight;Trach;Nutrition means;Medical stability;Weight bearing restrictions  Barriers to Discharge Comments: aphasia; cannot communicate; improving with OT- close to mod A - better sequencing; limited with gait- tried lite gait; hemiparetic- neglect- family ed needed; d/c 11/10? Possible Resolutions to [employer] Focus: PEG? vs improve/progress nutrition?;  R inattention very limiting- will increase Amantadine. D1 thin; trials of D2 thin; lots of spillage; using call bell just now   Continued Need for Acute Rehabilitation Level of Care: The patient requires daily medical management by a physician with specialized training in physical medicine and rehabilitation for the following reasons: Direction of a multidisciplinary physical rehabilitation program to maximize functional independence : Yes Medical management of patient stability for increased activity during participation in an intensive rehabilitation regime.: Yes Analysis of laboratory values and/or radiology reports with any subsequent need for medication adjustment and/or medical intervention. : Yes   I attest that I was present, lead the team conference, and concur with the assessment and plan of the team.   Tennis Must 04/19/2020, 5:50 PM

## 2020-04-20 ENCOUNTER — Inpatient Hospital Stay (HOSPITAL_COMMUNITY): Payer: Medicaid Other

## 2020-04-20 ENCOUNTER — Inpatient Hospital Stay (HOSPITAL_COMMUNITY): Payer: Medicaid Other | Admitting: Occupational Therapy

## 2020-04-20 ENCOUNTER — Encounter (HOSPITAL_COMMUNITY): Payer: Medicaid Other | Admitting: Psychology

## 2020-04-20 DIAGNOSIS — G8191 Hemiplegia, unspecified affecting right dominant side: Secondary | ICD-10-CM | POA: Diagnosis not present

## 2020-04-20 DIAGNOSIS — I63311 Cerebral infarction due to thrombosis of right middle cerebral artery: Secondary | ICD-10-CM

## 2020-04-20 LAB — GLUCOSE, CAPILLARY
Glucose-Capillary: 154 mg/dL — ABNORMAL HIGH (ref 70–99)
Glucose-Capillary: 121 mg/dL — ABNORMAL HIGH (ref 70–99)
Glucose-Capillary: 140 mg/dL — ABNORMAL HIGH (ref 70–99)
Glucose-Capillary: 74 mg/dL (ref 70–99)
Glucose-Capillary: 165 mg/dL — ABNORMAL HIGH (ref 70–99)

## 2020-04-20 MED ORDER — AMANTADINE HCL 50 MG/5ML PO SOLN
200.0000 mg | Freq: Every day | ORAL | Status: DC
  Administered 2020-04-20 – 2020-04-29 (×9): 200 mg
  Filled 2020-04-20 (×10): qty 20

## 2020-04-20 NOTE — Progress Notes (Signed)
RT came by to do a trach check but pt is not in her room at this time. RT will check back later.

## 2020-04-20 NOTE — Consult Note (Signed)
Neuropsychological Consultation   Patient:   Rieley Khalsa   DOB:   Jul 11, 1970  MR Number:  [id number]  Location:  [facility] [address] CENTER A [id number] [place] [address] Dept: [phone] Loc: [phone]           Date of Service:   04/20/2020  Start Time:   4 PM End Time:   4:30 PM  Provider/Observer:  Arley Phenix, Psy.D.       Clinical Neuropsychologist       Billing Code/Service: 75170   Chief Complaint:    Shavy Beachem is a 49 year old female with history of diabetes, hyperlipidemia, tobacco abuse.  Patient with severe aphasia but some improvement in her ability to have rudimentary speech of yes no and some single word sentences.  Patient presented on 03/08/2020 with right-sided hemiparesis and aphasia to an outside hospital.  Patient was found to have left MCA CVA.  Urine drug screen was positive for benzos and barbiturates.  CTA confirming of left M2 cutoff.  Patient brought to [facility] and underwent thrombectomy per interventional radiology.  MRI showed large territory infarct and moderate hemorrhage and mild subarachnoid hemorrhage.  Edema with mass-effect 7 mm right side midline shift.  EEG negative for seizure.  Patient required intubation for extended period of time underwent tracheostomy on 03/13/2020  Reason for Service:  Patient was referred for neuropsychological consultation due to coping and adjustment impacts of severe aphasia and significant cerebrovascular event.  Below is the HPI for the current admission.  HPI: Mayah Urquidi is a 49 year old right-handed female history of diabetes mellitus hyperlipidemia and tobacco abuse. History taken from chart review, niece, and therapies due to aphasia. She presented on 03/08/2020 with right side hemiparesis and aphasia to an outside hospital.  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  Urine drug screen positive benzos and barbiturates.   CTA confirming a left M2 cutoff.  She was brought to [facility] underwent thrombectomy per interventional radiology.  MRI showed large territory infarct with moderate hemorrhage and mild subarachnoid hemorrhage.  Edema with mass-effect 7 mm right side midline shift. Echocardiogram with ejection fraction of 60-65%, no wall motion abnormalities.  EEG negative for seizure.  She required intubation for extended period of time underwent tracheostomy on 03/13/2020 per Dr. Levon Hedger.  She was downsized to #4 cuffless trach 03/28/2020.  She is currently NPO with alternative means of nutritional support.  Maintain on Plavix for CVA prophylaxis.  Subcutaneous Lovenox for DVT prophylaxis.  Bouts of urinary retention, placed on low-dose Urecholine.  Therapy evaluations completed with recommendations of physical medicine rehab consult.  Patient was admitted for a comprehensive rehab program.  Please see preadmission assessment from earlier today as well.  Current Status:  The patient was laying back in her bed and clearly in distress when I attempted to speak with her.  The patient has severe aphasia and is limited primarily to yes no answers for clarity and was able to string together some limited very restricted sentences that were very hard to understand and very low in volume.  The patient repeatedly asked about how soon she can go home and was not distressed with her long-term hospitalization.  However, the ability to have any significant in-depth conversations were severely limited by the degree of expressive language deficits that were noted.  Behavioral Observation: Itzy Adler  presents as a 49 y.o.-year-old Right Caucasian Female who appeared her stated age. her dress  was Appropriate and she was Well Groomed and her manners were Appropriate to the situation.  her participation was indicative of Inattentive behaviors.  There were any physical disabilities noted.  she displayed an appropriate level  of cooperation and motivation.     Interactions:    Minimal Inattentive  Attention:   abnormal and attention span appeared shorter than expected for age  Memory:   abnormal; global memory impairment noted  Visuo-spatial:  not examined  Speech (Volume):  low  Speech:   non-fluent aphasia; non-fluent aphasia  Thought Process:  Circumstantial  Though Content:  Rumination;   Orientation:   Unsure of degree of orientation and mental status due to severe expressive aphasia.  Judgment:   Poor  Planning:   Poor  Affect:    Depressed, Lethargic and Tearful  Mood:    Dysphoric  Insight:   Fair  Intelligence:   normal   Medical History:   Past Medical History:  Diagnosis Date   DM (diabetes mellitus), type 2 (HCC)    Hyperlipidemia    Hypersomnia    Sprain of right ankle, initial encounter    Stroke [facility])    Syncope    Psychiatric History:  No prior psychiatric history noted but I was not able to get any information directly from the patient.  Family Med/Psych History:  Family History  Problem Relation Age of Onset   Heart defect Mother        Leaky Valve   Heart Problems Father        Enlarged heart    Heart defect Maternal Grandmother        Leaky Valve    Stroke Paternal Grandfather    Impression/DX:  Celes Dedic is a 49 year old female with history of diabetes, hyperlipidemia, tobacco abuse.  Patient with severe aphasia but some improvement in her ability to have rudimentary speech of yes no and some single word sentences.  Patient presented on 03/08/2020 with right-sided hemiparesis and aphasia to an outside hospital.  Patient was found to have left MCA CVA.  Urine drug screen was positive for benzos and barbiturates.  CTA confirming of left M2 cutoff.  Patient brought to [facility] and underwent thrombectomy per interventional radiology.  MRI showed large territory infarct and moderate hemorrhage and mild subarachnoid hemorrhage.  Edema with  mass-effect 7 mm right side midline shift.  EEG negative for seizure.  Patient required intubation for extended period of time underwent tracheostomy on 03/13/2020  The patient was laying back in her bed and clearly in distress when I attempted to speak with her.  The patient has severe aphasia and is limited primarily to yes no answers for clarity and was able to string together some limited very restricted sentences that were very hard to understand and very low in volume.  The patient repeatedly asked about how soon she can go home and was not distressed with her long-term hospitalization.  However, the ability to have any significant in-depth conversations were severely limited by the degree of expressive language deficits that were noted.  Diagnosis:    Cerebrovascular accident (CVA) due to thrombosis of right middle cerebral artery (HCC) - Plan: Ambulatory referral to Neurology  Hypoxia - Plan: DG CHEST PORT 1 VIEW, DG CHEST PORT 1 VIEW  Dysphagia - Plan: IR GASTROSTOMY TUBE MOD SED, IR GASTROSTOMY TUBE MOD SED         Electronically Signed   _______________________ Arley Phenix, Psy.D.

## 2020-04-20 NOTE — Progress Notes (Signed)
Per Interventional Radiology, the plavix must be stopped for five days prior to the planned procedure, this was discussed with D. Angiulli, PA and the plavis was discontinued.

## 2020-04-20 NOTE — Progress Notes (Signed)
[place] PHYSICAL MEDICINE & REHABILITATION PROGRESS NOTE   Subjective/Complaints:   Pt sitting on toilet- just had BM on toilet (usually incontinent).   Spoke to SLP, pt really isn't going to increase her intake, it appears and won't be able to increase/progress diet.   So likely needs PEG;  since relying on Cortrak for nutrition right now.   Will call family to get approval for PEG.  Speaking/vocalizing, but not verbalizing much this AM.     ROS: limited due to aphasia   Objective:   No results found. Recent Labs    04/18/20 0803  WBC 13.1*  HGB 11.8*  HCT 36.8  PLT 382   Recent Labs    04/18/20 0803  NA 139  K 4.5  CL 101  CO2 24  GLUCOSE 153*  BUN 26*  CREATININE 0.92  CALCIUM 9.6    Intake/Output Summary (Last 24 hours) at 04/20/2020 0915 Last data filed at 04/20/2020 0834 Gross per 24 hour  Intake 100 ml  Output --  Net 100 ml        Physical Exam: Vital Signs Blood pressure 117/64, pulse 90, temperature 98.3 F (36.8 C), temperature source Oral, resp. rate 16, height 5' (1.524 m), weight 89.7 kg, SpO2 96 %. Constitutional: No distress . Vital signs reviewed. Sitting on toilet, appropriate, garbled/can't hear words type of speech, OT in room, NAD HEENT: EOMI, oral membranes moist, NGT in place still Neck: supple, trach with cap in place- still Cardiovascular:RRR Respiratory/Chest: CTA B/L- no W/R/R- good air movement GI/Abdomen:  Soft, NT, ND, (+)BS  Psych: pleasant and cooperative-garbled speech- no words understood-  Musc: No edema in extremities.  No tenderness in extremities. Neuro: Awakens easily. Severe expressive aphasia. Follows simple commands- kept perseverating on "yes" this AM Motor: Limited due to participation, but spontaneously moving left side, no movement noted on right side, unchanged No abnl tone   Assessment/Plan: 1. Functional deficits secondary to L MCA stroke with aphasia, and R heimplegia  which require 3+ hours  per day of interdisciplinary therapy in a comprehensive inpatient rehab setting.  Physiatrist is providing close team supervision and 24 hour management of active medical problems listed below.  Physiatrist and rehab team continue to assess barriers to discharge/monitor patient progress toward functional and medical goals  Care Tool:  Bathing    Body parts bathed by patient: Chest, Abdomen, Right upper leg, Left upper leg, Face, Front perineal area, Right lower leg, Left lower leg, Right arm, Buttocks   Body parts bathed by helper: Left arm, Buttocks     Bathing assist Assist Level: Moderate Assistance - Patient 50 - 74% (Min-Mod)     Upper Body Dressing/Undressing Upper body dressing   What is the patient wearing?: Pull over shirt    Upper body assist Assist Level: Moderate Assistance - Patient 50 - 74%    Lower Body Dressing/Undressing Lower body dressing      What is the patient wearing?: Pants, Underwear/pull up     Lower body assist Assist for lower body dressing: Maximal Assistance - Patient 25 - 49%     Toileting Toileting Toileting Activity did not occur (Clothing management and hygiene only): N/A (no void or bm)  Toileting assist Assist for toileting: 2 Helpers     Transfers Chair/bed transfer  Transfers assist     Chair/bed transfer assist level: Moderate Assistance - Patient 50 - 74%     Locomotion Ambulation   Ambulation assist   Ambulation activity did not occur:  Safety/medical concerns (due to patient fatigue/weakness)  Assist level: 2 helpers Assistive device: Walker-platform Max distance: 7   Walk 10 feet activity   Assist  Walk 10 feet activity did not occur: Safety/medical concerns (due to patient fatigue/weakness)  Assist level: 2 helpers Assistive device: Lite Gait   Walk 50 feet activity   Assist Walk 50 feet with 2 turns activity did not occur: Safety/medical concerns (due to patient fatigue/weakness)         Walk  150 feet activity   Assist Walk 150 feet activity did not occur: Safety/medical concerns (due to patient fatigue/weakness)         Walk 10 feet on uneven surface  activity   Assist Walk 10 feet on uneven surfaces activity did not occur: Safety/medical concerns (due to patient fatigue/weakness)         Wheelchair     Assist Will patient use wheelchair at discharge?: Yes Type of Wheelchair: Manual    Wheelchair assist level: Dependent - Patient 0% Max wheelchair distance: 75    Wheelchair 50 feet with 2 turns activity    Assist        Assist Level: Dependent - Patient 0%   Wheelchair 150 feet activity     Assist      Assist Level: Dependent - Patient 0%   Blood pressure 117/64, pulse 90, temperature 98.3 F (36.8 C), temperature source Oral, resp. rate 16, height 5' (1.524 m), weight 89.7 kg, SpO2 96 %.  Medical Problem List and Plan: 1.  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  2.  Antithrombotics: -DVT/anticoagulation: Lovenox             -antiplatelet therapy: Plavix 75 mg daily 3. Pain Management: Tylenol as needed.   10/25- controlled- con't regimen  10/26- taped R shoulder to help with pain- will see how does- by OT  11/2- no TTP over R shoulder now- con't tylenol prn 4. Mood: Provide emotional support             -antipsychotic agents: N/A 5. Neuropsych: This patient is not capable of making decisions on her own behalf. 6. Skin/Wound Care: Routine skin checks 7. Fluids/Electrolytes/Nutrition: Routine in and outs. 8.  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  Currently with a #4 cuffless trach as of 03/28/2020. Continue PMV as tolerated. Follow-up speech therapy. Tolerating room air.  9.   Post-stroke Dysphagia.    D1 nectars initially- now D1 thins  Continue tube feeds  Freewater flushes increased to 250 mL every 3 hours, has normal echo  Labs ordered for tomorrow  10/25- BUN 24- slightly elevated-   10/28- D1 thins now-   10/30-31 eating around 25-50% meals   -continue TF  11/2- con't TFs- still a little dry- esp needs water- will call family about possible PEG< since cannot go home with NGT.   11/3- called family- got con's permission to go ahead with PEG- placed IR consult (PA) 10. Uncontrolled diabetes mellitus with hyperglycemia.  Hemoglobin A1c 10.7.  NovoLog 10 units every 4 hours, Lantus insulin 24 units twice daily.   CBG (last 3)  Recent Labs    04/19/20 2351 04/20/20 0407 04/20/20 0748  GLUCAP 148* 154* 121*   Slightly elevated on 10/24, likely to be component as  well  10/25- BGs 119-160- con't regimen for now  10/26- BGs 130-185- con't regimen- might increase Lantus tomorrow if still up  11/3- BGs stable- con't regimen 11.  Urinary retention.  Urecholine 10 mg 3 times daily.    Flomax started on 10/23            PVRs previously with retention 12.  Hyperlipidemia. Lipitor 13.  Tobacco abuse. Counseled on appropriate 14. Leukocytosis             WBCs 10.8 on 10/18, labs ordered for tomorrow  10/25- WBC 11.2- will check U/A and Cx to make sure doesn't have UTI  10/26- no UTI per U/A results  10/28- per son and pt when asked, "she's always had a higher WBC"- might need outpt w/u but nothing to worry about for ID-wise, here.  15. Acute blood loss anemia:   Hgb 11.2 on 10/18, labs ordered for tomorrow 16. Constipation:   Improved 17. Hypotension:   10/31- BP controlled- con't regimen 18. Trach for previous resp failure  10/30-31--no issues, capped   -dc tomorrow potentially per primary team  11/1- had lower sats 90-92%- might have been mucus plug that resp got out? CXR shows nothing specific- MAYBE small L pleural effusion.  No fever, no increase in WBC  above baseline. Will NOT take out trach, like planned and will put cap back on and monitor  11/2- wait to remove trach- sats 93% RA- but used to be 96% and above.  11/3- sats 96% and above- no suctioning yesterday= if OK, will remove trach tomorrow, if still doing OK.   19. Dispo  11/2- will call family about PEG- because not eating well.   11/3- d/w family- will get PEG.    LOS: 21 days A FACE TO FACE EVALUATION WAS PERFORMED  Deborah [name] 04/20/2020, 9:15 AM

## 2020-04-20 NOTE — Progress Notes (Signed)
Speech Language Pathology Daily Session Note  Patient Details  Name: Deborah [name] MRN: [id number] Date of Birth: Nov 11, 1970  Today's Date: 04/20/2020 SLP Individual Time: 8341-9622 SLP Individual Time Calculation (min): 57 min  Short Term Goals: Week 3: SLP Short Term Goal 1 (Week 3): Patient will point to identify basic level verb/actions (eat, sleep, etc) in field of two with 80% accuracy. SLP Short Term Goal 2 (Week 3): Patient will manage left anterior spillage and oral pocketing of solids with modA and use of mirror for awareness. SLP Short Term Goal 3 (Week 3): Patient will imitate at multisyllabic word and functional phrase level with 75% accuracy and modA. SLP Short Term Goal 4 (Week 3): Patient will use communication board with 4-6 field choices to request basic needs/wants as well as with task specific communication boards, with minA. SLP Short Term Goal 5 (Week 3): Patient will tolerate trials of Dysphagia 2 solids with minimal difficulty managing at oral phase and minimal A cues. SLP Short Term Goal 6 (Week 3): Patient will respond to basic level yes/no questions (verbally, head nods, yes/no board pointing) with 80% accuracy.  Skilled Therapeutic Interventions:Skilled ST services focused on swallow and language skills. Pt's O2 stats were 93-94 entering room, given no/yes questions and yes/no board pt indicated no issues with breathing. SLP facilitated PO consumption of limited dys 2 textured snack, pt demonstrated appropriate clearance and no anterior spillage. Pt demonstrated ability to express wants/needs with presentation of yes/no board with mod multimodal cues and veralized "done." SLP facilitated receptive language skills in response to yes/no questions identifying ADL objects in black/white drawing in 8 out 10 opportunties. Pt selected requested picture in a field of 2 in 4 out 5 opportunties and in a field of 4 with mod A fade to min A semantic cues. SLP left room to copy  field of 6 communication. When SLP returned Pt had removed cap and expressed difficulty breathing in response to yes/no questions and use of communication board with mod A multimodal cues. SLP notified nursing and nursing provided suction. Pt's O2 stats without cap in place was 97-99 and with cap back on averaged around 93-96. SLP continued accuracy of selecting requested picture in a field of 6 with an average of 60% accuracy after 4 trials and required mod A semantic cues. Pt was left in room with call bell within reach and bed/chair alarm set. SLP recommends to continue skilled services.     Pain Pain Assessment Pain Score: 0-No pain  Therapy/Group: Individual Therapy  Derrick Orris  [facility] 04/20/2020, 3:50 PM

## 2020-04-20 NOTE — Progress Notes (Signed)
Occupational Therapy Session Note  Patient Details  Name: Deborah [name] MRN: [id number] Date of Birth: [birth year]  Today's Date: 04/20/2020 OT Individual Time: 2440-1027 OT Individual Time Calculation (min): 58 min    Short Term Goals: Week 3:  OT Short Term Goal 1 (Week 3): Pt will don pants with Mod A with sit to stand to don over hips OT Short Term Goal 2 (Week 3): Pt will perform sit to stand consistently with Min A of 1 in prep for LB ADL OT Short Term Goal 3 (Week 3): Pt will perform don shirt with hemidressing technique no more than Min A  Skilled Therapeutic Interventions/Progress Updates:    Pt greeted at time of session semireclined in bed, asked if she was wet and answered "yes" but when tried to use yues/no communication board got slightly frustrated. However pt did repeat yes that she was wet and wanted to change brief. Supine to sit Min/Mod A for RLE management, able to scoot EOB Min. Squat pivot Mod A with assist to fully get to L side of chair as she has a tendency to sit early. Brought to bathroom via wheelchair Min-Mod A to help propel. Stand pivot with use of grab bar w/c <> toilet Mod A with knee block. Max A to doff wet brief in standing. Pt able to clean periarea in front and assist with buttocks, but d/t BM needed Mod A helping clean. Pt did have continent void of B/B on toilet. Max A today to don brief as underwear and pants in standing but pt trying to help. UB dress don/doff Mod A with hemidressing. Once back in chair, set up with alarm on call bell in reach and performed self feeding for breakfast with supervision. NT aware to continue Supervision after OT session ended d/t pt is full supervision. O2 sats remained 96% or higher throughout session.   Therapy Documentation Precautions:  Precautions Precautions: Fall Precaution Comments: NG, dense R Hemi, trach, aphasic Restrictions Weight Bearing Restrictions: No    Therapy/Group: Individual Therapy  Erasmo Score 04/20/2020, 8:24 AM

## 2020-04-20 NOTE — Progress Notes (Signed)
Physical Therapy Session Note  Patient Details  Name: Deborah [name] MRN: [id number] Date of Birth: [birth year]  Today's Date: 04/20/2020 PT Individual Time:Session1: 7209-4709; Session2:1528-1606 PT Individual Time Calculation (min): 75 min & 38 min  Short Term Goals: Week 3:  PT Short Term Goal 1 (Week 3): Patient to ambulate 41' with mod A and LRAD PT Short Term Goal 2 (Week 3): Patient to negotiate 2 steps with mod A with rail PT Short Term Goal 3 (Week 3): Patient to propel w/c x 52' with S PT Short Term Goal 4 (Week 3): Patient to perform car transfer with mod A  Skilled Therapeutic Interventions/Progress Updates:    Session1:  Patient in w/c requesting to change due to soiled.  Patient assisted in w/c to bathroom.  Pivot with +2 A for clothing management to toilet and back from toilet after brief change.  Total A to don shoes, pt assisted to gym in w/c.  Transfer to mat with mod A and cues. Seated EOM forward reaching with A for R UE placement on mat or on her R knee, using L hand to retrieve clothes pins on R and place in box, pt placing all on L side of box till cues to move to R side, pt vocalizing counting accurate to 6 then difficulty with numbers.  Patient sit to supine min A for R LE, rolled to R with min A.  Side elbow prop with mod A and weight shifting through R side of trunk x 10 mod A x 2 sets, pt fearful of R UE pain.  Supine bridging 2 x 10 with A for R LE.  Attempting R LE only, but pt unable to sequence.  Patient side to sit with mod A.  Patient sit to stand to PFRW and work on activating R LE in static standing with PFRW.  Progressing to stand step with L forward and back with max cues and demonstration and increased time for pt to perform.  Applied walking sling on maxisky and pt with ace wrap on R LE, attempted forward ambulation with A for R LE, but pt continued to step with L and not with R despite cues and max A for progressing R LE and noted increased R LE flexor tone.   Patient assisted to step backwards to mat and doffed sling.  Patient assisted to w/c mod A and propelled chair x 50' with min A then assisted rest of the way to room and stand pivot to bed mod A, sit to supine min A and positioned for comfort with bed alarm activated and call bell in reach.   Session2:Patient in supine, assisted from elevated HOB to sitting min A for R LE.  Patient seated and dependent to don shoes.  Transfer to w/c mod A and noted pt soiled with urine.  Assisted into bathroom via w/c and transfer to toilet with A for doffing pants/briefs and positioning safely with mod A.  Patient seated for hygiene with S.  Sit to stand after change brief and pants with total A.  Patient standing with total A for donning pants/brief and transfer to w/c mod A.  Patient in w/c propelled x 60' mod cues and min A.  Patient assisted to gym.  Sit to stand to hi-lo table and activity with colored pegs, (Placed on R side so max verbal and demonsrtating cues for picking right color and pt with incressed time to place on peg board.  Two standing bouts with mod support and cues  for balance for approx 1.5 - 2 minutes.  Assisted in w/c to room and transfer to bed at pt requests with mod A, sit to supine min A.  Patient left with bed alarm activated and call bell in reach.   Therapy Documentation Precautions:  Precautions Precautions: Fall Precaution Comments: NG, dense R Hemi, trach, aphasic Restrictions Weight Bearing Restrictions: No  Pain: Pain Assessment Pain Scale: Faces Pain Score: 0-No pain Faces Pain Scale: Hurts little more Pain Type: Acute pain Pain Location: Arm Pain Orientation: Right Pain Descriptors / Indicators: Grimacing;Guarding Pain Onset: With Activity Pain Intervention(s): Repositioned   Therapy/Group: Individual Therapy  [name]  [name], PT 04/20/2020, 10:37 AM

## 2020-04-21 ENCOUNTER — Inpatient Hospital Stay (HOSPITAL_COMMUNITY): Payer: Medicaid Other | Admitting: Speech Pathology

## 2020-04-21 ENCOUNTER — Inpatient Hospital Stay (HOSPITAL_COMMUNITY): Payer: Medicaid Other | Admitting: Occupational Therapy

## 2020-04-21 ENCOUNTER — Inpatient Hospital Stay (HOSPITAL_COMMUNITY): Payer: Medicaid Other

## 2020-04-21 LAB — GLUCOSE, CAPILLARY
Glucose-Capillary: 105 mg/dL — ABNORMAL HIGH (ref 70–99)
Glucose-Capillary: 109 mg/dL — ABNORMAL HIGH (ref 70–99)
Glucose-Capillary: 129 mg/dL — ABNORMAL HIGH (ref 70–99)
Glucose-Capillary: 142 mg/dL — ABNORMAL HIGH (ref 70–99)
Glucose-Capillary: 192 mg/dL — ABNORMAL HIGH (ref 70–99)
Glucose-Capillary: 252 mg/dL — ABNORMAL HIGH (ref 70–99)
Glucose-Capillary: 314 mg/dL — ABNORMAL HIGH (ref 70–99)
Glucose-Capillary: 65 mg/dL — ABNORMAL LOW (ref 70–99)

## 2020-04-21 MED ORDER — INSULIN ASPART 100 UNIT/ML ~~LOC~~ SOLN
10.0000 [IU] | Freq: Three times a day (TID) | SUBCUTANEOUS | Status: DC
  Administered 2020-04-22 – 2020-04-29 (×12): 10 [IU] via SUBCUTANEOUS

## 2020-04-21 MED ORDER — ENSURE MAX PROTEIN PO LIQD
11.0000 [oz_av] | Freq: Three times a day (TID) | ORAL | Status: DC
  Administered 2020-04-21 – 2020-04-26 (×7): 11 [oz_av] via ORAL
  Filled 2020-04-21 (×2): qty 330

## 2020-04-21 NOTE — Significant Event (Signed)
Hypoglycemic Event  CBG: 65  Treatment: 4 oz juice/soda  Symptoms: None  Follow-up CBG: Time:1708 CBG Result:109  Possible Reasons for Event: Inadequate meal intake  Comments/MD notified: yes, Pam love    Deborah [name]

## 2020-04-21 NOTE — Progress Notes (Signed)
Patient ID: Deborah [name], female   DOB: 06-27-1970, 49 y.o.   MRN: [id number]   Patient has been accepted by [facility] for [facility] follow up. Orders have been sent over.  [place], [place] [phone]

## 2020-04-21 NOTE — Progress Notes (Signed)
Trach removed per order. Vital signs stable. Pt is in no distress.

## 2020-04-21 NOTE — Progress Notes (Signed)
Held pt's 10 units of scheduled TID SSI due to order stating if pt does not consume >50% of meal to hold insulin. Pt only ate 15% of dinner.  Bridgett Larsson LPN

## 2020-04-21 NOTE — Progress Notes (Signed)
Speech Language Pathology Daily Session Note  Patient Details  Name: [name] MRN: [id number] Date of Birth: Sep 21, 1970  Today's Date: 04/21/2020 SLP Individual Time: 1000-1100 SLP Individual Time Calculation (min): 60 min  Short Term Goals: Week 3: SLP Short Term Goal 1 (Week 3): Patient will point to identify basic level verb/actions (eat, sleep, etc) in field of two with 80% accuracy. SLP Short Term Goal 2 (Week 3): Patient will manage left anterior spillage and oral pocketing of solids with modA and use of mirror for awareness. SLP Short Term Goal 3 (Week 3): Patient will imitate at multisyllabic word and functional phrase level with 75% accuracy and modA. SLP Short Term Goal 4 (Week 3): Patient will use communication board with 4-6 field choices to request basic needs/wants as well as with task specific communication boards, with minA. SLP Short Term Goal 5 (Week 3): Patient will tolerate trials of Dysphagia 2 solids with minimal difficulty managing at oral phase and minimal A cues. SLP Short Term Goal 6 (Week 3): Patient will respond to basic level yes/no questions (verbally, head nods, yes/no board pointing) with 80% accuracy.  Skilled Therapeutic Interventions:   Patient seen for skilled ST session focusing on dysphagia and speech-language goals. Patient's Mom present in room for portion of session. Patient was decannulated this morning (0915) and currently with dressing to stoma. She was able return demonstrate when SLP cued her to press hand lightly on dressing on stoma to decrease air leakage until fully healed. Patient consumed cold cereal with milk without s/s aspiration or penetration and with some improved awareness to and control of right sided anterior spillage. No oral residuals observed post-swallow.  Patient able to imitate at multisyllabic word level with 75% accuracy. She is unable to produce /k/ or /g/ phonemes and became frustrated when trying to do so. She also  became frustrated when she was not able to name objects, perseverating on saying "pocketbook" unless redirected. Patient enjoyed playing game of [place] with minA cues to find color or number match. She spontaneously gave a thumbs down when asked if she wanted a chocolate muffin. Patient continues to benefit from skilled SLP intervention to maximize cognitive-linguistic and swallow function prior to discharge.   Pain Pain Assessment Pain Scale: 0-10 Pain Score: 0-No pain  Therapy/Group: Individual Therapy  Pablo Lawrence 04/21/2020, 12:58 PM

## 2020-04-21 NOTE — Progress Notes (Signed)
Patient hypoglycemia down to 65. Tube feeds now at nights but still on novolog 10 units every 4 hrs-->changed to ac tid. Also getting Glucerna (26 net carbs) tid between meals-->changed to Ensure Max (5 net carbs) for supplement between meals to help stabilize BS.

## 2020-04-21 NOTE — Progress Notes (Signed)
[place] PHYSICAL MEDICINE & REHABILITATION PROGRESS NOTE   Subjective/Complaints:   Speaking/words more this AM- said "dirty" about depends (they weren't- just put on), "very cold" since my hands were cold; and yes and no multiple times.  LBM- incontinent- this AM Waiting on PEG  Large stool burden on CT  ROS: limited due to aphasia   Objective:   CT ABDOMEN WO CONTRAST  Result Date: 04/20/2020 CLINICAL DATA:  Evaluate anatomy prior to potential percutaneous gastrostomy tube placement. EXAM: CT ABDOMEN WITHOUT CONTRAST TECHNIQUE: Multidetector CT imaging of the abdomen was performed following the standard protocol without IV contrast. COMPARISON:  None. FINDINGS: The lack of intravenous contrast limits the ability to evaluate solid abdominal organs. Lower chest: Limited visualization of the lower thorax demonstrates minimal dependent subpleural ground-glass atelectasis, right greater than left. No discrete focal airspace opacities. No pleural effusion Normal heart size. There is diffuse decreased attenuation of the intra cardiac blood pool suggestive of anemia. No pericardial effusion. Hepatobiliary: Normal hepatic contour. Normal noncontrast appearance of the gallbladder given degree distention. No radiopaque gallstones. No ascites. Pancreas: Normal noncontrast appearance of the pancreas. Spleen: Normal noncontrast appearance spleen. Note is made of a small splenule about the splenic hilum. 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 No evidence of right-sided nephrolithiasis or urinary obstruction. Normal noncontrast appearance the bilateral adrenal glands. The urinary bladder was not imaged. Stomach/Bowel: The anterior wall of the stomach is well apposed against the ventral wall of the upper abdomen without interposition of the hepatic parenchyma or the transverse colon and the  percutaneous window will likely be improved with gastric insufflation. Enteric tube tip terminates within the gastric antrum. Large colonic stool burden without evidence of enteric obstruction. No pneumoperitoneum, pneumatosis or portal venous gas. Vascular/Lymphatic: Moderate amount calcified atherosclerotic plaque within normal caliber abdominal aorta. No bulky retroperitoneal or mesenteric lymphadenopathy on this noncontrast examination. Other: Ill-defined subcutaneous stranding and scattered foci of subcutaneous emphysema involving the ventral aspect of the abdominal pannus likely the location of subcutaneous medication administration. Note is made of 2 adjacent narrow neck mesenteric fat containing midline ventral wall hernias (representative sagittal image 63, series 7). There is a minimal amount of subcutaneous edema about the midline of the low back. Musculoskeletal: No acute or aggressive osseous abnormalities. IMPRESSION: 1. Gastric anatomy amenable to potential percutaneous gastrostomy tube placement as indicated. 2. Large colonic stool burden without evidence of enteric obstruction. 3. 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 No evidence of right-sided urinary obstruction. 4. Aortic Atherosclerosis (ICD10-I70.0). Electronically Signed   By: Simonne Come M.D.   On: 04/20/2020 14:01   No results for input(s): WBC, HGB, HCT, PLT in the last 72 hours. No results for input(s): NA, K, CL, CO2, GLUCOSE, BUN, CREATININE, CALCIUM in the last 72 hours.  Intake/Output Summary (Last 24 hours) at 04/21/2020 0846 Last data filed at 04/20/2020 1843 Gross per 24 hour  Intake 79.5 ml  Output --  Net 79.5 ml        Physical Exam: Vital Signs Blood pressure (!) 111/55, pulse 94, temperature 98.5 F (36.9 C), temperature source Oral, resp. rate 20, height 5' (1.524 m), weight 88.4 kg, SpO2 96 %. Constitutional: No distress . Vital signs reviewed. Sitting on  toilet- perseverating on dirty depends until removed, smiling, NAD HEENT: EOMI, oral membranes moist, Cortrak in place Neck: supple, trach capped Cardiovascular:RRR Respiratory/Chest: CTA B/L- no W/R/R- good air movement  GI/Abdomen: Soft, NT, ND, (+)BS   Psych: pleasant and cooperative-less garbled speech today- understood a few words Musc: No edema in extremities.  No tenderness in extremities. Neuro: Awakens easily. Severe expressive aphasia. Follows simple commands- kept perseverating on "yes" this AM Motor: Limited due to participation, but spontaneously moving left side, no movement noted on right side, unchanged No abnl tone   Assessment/Plan: 1. Functional deficits secondary to L MCA stroke with aphasia, and R heimplegia  which require 3+ hours per day of interdisciplinary therapy in a comprehensive inpatient rehab setting.  Physiatrist is providing close team supervision and 24 hour management of active medical problems listed below.  Physiatrist and rehab team continue to assess barriers to discharge/monitor patient progress toward functional and medical goals  Care Tool:  Bathing    Body parts bathed by patient: Chest, Abdomen, Right upper leg, Left upper leg, Face, Front perineal area, Right lower leg, Left lower leg, Right arm, Buttocks   Body parts bathed by helper: Left arm, Buttocks     Bathing assist Assist Level: Moderate Assistance - Patient 50 - 74% (Min-Mod)     Upper Body Dressing/Undressing Upper body dressing   What is the patient wearing?: Pull over shirt    Upper body assist Assist Level: Moderate Assistance - Patient 50 - 74%    Lower Body Dressing/Undressing Lower body dressing      What is the patient wearing?: Pants, Underwear/pull up     Lower body assist Assist for lower body dressing: Maximal Assistance - Patient 25 - 49%     Toileting Toileting Toileting Activity did not occur (Clothing management and hygiene only): N/A (no void or  bm)  Toileting assist Assist for toileting: Total Assistance - Patient < 25%     Transfers Chair/bed transfer  Transfers assist     Chair/bed transfer assist level: Moderate Assistance - Patient 50 - 74%     Locomotion Ambulation   Ambulation assist   Ambulation activity did not occur: Safety/medical concerns (due to patient fatigue/weakness)  Assist level: 2 helpers Assistive device: Walker-platform (maxisky) Max distance: 3   Walk 10 feet activity   Assist  Walk 10 feet activity did not occur: Safety/medical concerns (due to patient fatigue/weakness)  Assist level: 2 helpers Assistive device: Lite Gait   Walk 50 feet activity   Assist Walk 50 feet with 2 turns activity did not occur: Safety/medical concerns (due to patient fatigue/weakness)         Walk 150 feet activity   Assist Walk 150 feet activity did not occur: Safety/medical concerns (due to patient fatigue/weakness)         Walk 10 feet on uneven surface  activity   Assist Walk 10 feet on uneven surfaces activity did not occur: Safety/medical concerns (due to patient fatigue/weakness)         Wheelchair     Assist Will patient use wheelchair at discharge?: Yes Type of Wheelchair: Manual    Wheelchair assist level: Minimal Assistance - Patient > 75% Max wheelchair distance: 68'    Wheelchair 50 feet with 2 turns activity    Assist        Assist Level: Minimal Assistance - Patient > 75%   Wheelchair 150 feet activity     Assist      Assist Level: Dependent - Patient 0%   Blood pressure (!) 111/55, pulse 94, temperature 98.5 F (36.9 C), temperature source Oral, resp. rate 20, height 5' (1.524 m), weight 88.4 kg, SpO2 96 %.  Medical Problem List and Plan: 1.  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  2.  Antithrombotics: -DVT/anticoagulation: Lovenox             -antiplatelet therapy: Plavix 75 mg daily 3. Pain Management: Tylenol as needed.   10/25- controlled- con't regimen  10/26- taped R shoulder to help with pain- will see how does- by OT  11/2- no TTP over R shoulder now- con't tylenol prn 4. Mood: Provide emotional support             -antipsychotic agents: N/A 5. Neuropsych: This patient is not capable of making decisions on her own behalf. 6. Skin/Wound Care: Routine skin checks 7. Fluids/Electrolytes/Nutrition: Routine in and outs. 8.  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  Currently with a #4 cuffless trach as of 03/28/2020. Continue PMV as tolerated. Follow-up speech therapy. Tolerating room air.  9.  Post-stroke Dysphagia.    D1 nectars initially- now D1 thins  Continue tube feeds  Freewater flushes increased to 250 mL every 3 hours, has normal echo  Labs ordered for tomorrow  10/25- BUN 24- slightly elevated-   10/28- D1 thins now-   10/30-31 eating around 25-50% meals   -continue TF  11/2- con't TFs- still a little dry- esp needs water- will call family about possible PEG< since cannot go home with NGT.   11/3- called family- got con's permission to go ahead with PEG- placed IR consult (PA)  11/4- waiting to get PEG_ CT of abd/pelvis done 10. Uncontrolled diabetes mellitus with hyperglycemia.  Hemoglobin A1c 10.7.  NovoLog 10 units every 4 hours, Lantus insulin 24 units twice daily.   CBG (last 3)  Recent Labs    04/20/20 2017 04/21/20 0004 04/21/20 0411  GLUCAP 165* 142* 129*   Slightly elevated on 10/24, likely to be component as well  11/4- BGs stable -con't regimen 11.  Urinary retention.  Urecholine 10 mg 3 times daily.    Flomax started on 10/23            PVRs previously with retention 12.  Hyperlipidemia. Lipitor 13.  Tobacco abuse. Counseled on  appropriate 14. Leukocytosis             WBCs 10.8 on 10/18, labs ordered for tomorrow  10/25- WBC 11.2- will check U/A and Cx to make sure doesn't have UTI  10/26- no UTI per U/A results  10/28- per son and pt when asked, "she's always had a higher WBC"- might need outpt w/u but nothing to worry about for ID-wise, here.  15. Acute blood loss anemia:   Hgb 11.2 on 10/18, labs ordered for tomorrow 16. Constipation:   Improved 17. Hypotension:   11/4- BP controlled- con't regimen 18. Trach for previous resp failure  10/30-31--no issues, capped   -dc tomorrow potentially per primary team  11/1- had lower sats 90-92%- might have been mucus plug that resp got out? CXR shows nothing specific- MAYBE small L pleural effusion.  No fever, no increase in WBC above baseline. Will NOT take out trach, like planned and will put cap back on and monitor  11/2- wait to remove trach- sats 93% RA- but used to be 96% and above.  11/3- sats 96% and above- no suctioning yesterday= if OK,  will remove trach tomorrow, if still doing OK.  11/4- will see if can decannulate today- call resp.    19. Dispo  11/2- will call family about PEG- because not eating well.   11/3- d/w family- will get PEG.    LOS: 22 days A FACE TO FACE EVALUATION WAS PERFORMED  Aniketh Huberty 04/21/2020, 8:46 AM

## 2020-04-21 NOTE — Progress Notes (Signed)
Physical Therapy Session Note  Patient Details  Name: Deborah [name] MRN: [id number] Date of Birth: 1971-01-05  Today's Date: 04/21/2020 PT Individual Time: 1400-1315 PT Individual Time Calculation (min): 1395 min   Short Term Goals: Week 3:  PT Short Term Goal 1 (Week 3): Patient to ambulate 1' with mod A and LRAD PT Short Term Goal 2 (Week 3): Patient to negotiate 2 steps with mod A with rail PT Short Term Goal 3 (Week 3): Patient to propel w/c x 64' with S PT Short Term Goal 4 (Week 3): Patient to perform car transfer with mod A  Skilled Therapeutic Interventions/Progress Updates:    Patient up in w/c in room.  Noted trach decannulated today.  Motioned for her shoes and assisted to don in sitting.  Patient propelled w/c x 60' min A max cues for R side obstacles.  Patient assisted to gym and transferred to mat mod A after w/c set up.  Donned maxi sky vest walking sling.  Use of mirror for feedback and increased R side awareness.  Patient sit to stand +2 A for safety and strapped R arm into PFRW.  Ambulated x 4' x 2 with PFRW and +2 A with cues and assist for R LE progression, weight shift, balance, safety.  Patient performed a standing balance activity at hi-lo table with A for R LE stability playing checkers reaching with L UE and A to stabilize with R UE x 3 bouts for 2-3 minutes each.  Patient assisted in w/c to dayroom, transferred to [facility] with +2 A for safety.  Performed 3 x 1 minute bouts at 30 cm/sec mod cues for R LE extension.  Mod to max A transfer back to w/c and assisted to room.  Toilet transfer mod A with grabbar and A for clothing management.  Changed soiled brief and assist back to w/c max cues and mod A.  Patient transferred to bed with max A and cues for standing up further.  Min A for R LE to supine and positioned herself up in bed pulling on headboard.  Left with bed alarm activated and needs in reach.  Therapy Documentation Precautions:  Precautions Precautions:  Fall Precaution Comments: NG, dense R Hemi, trach, aphasic Restrictions Weight Bearing Restrictions: No Pain: Pain Assessment Pain Scale: 0-10 Pain Score: 0-No pain    Therapy/Group: Individual Therapy  [name]  [name], PT 04/21/2020, 2:56 PM

## 2020-04-21 NOTE — Progress Notes (Signed)
Patient ID: Deborah [name], female   DOB: Jul 29, 1970, 49 y.o.   MRN: [id number]   Patient declined by [facility] due to insurance. Will continue to follow up

## 2020-04-21 NOTE — Progress Notes (Addendum)
Patient ID: Deborah [name], female   DOB: 16-Jan-1971, 49 y.o.   MRN: [id number]   Hemi height wheelchair and youth rolling walker ordered through [employer].  [place], [place] [phone]

## 2020-04-21 NOTE — Progress Notes (Signed)
Occupational Therapy Weekly Progress Note  Patient Details  Name: Deborah [name] MRN: [id number] Date of Birth: 1970/12/06  Beginning of progress report period: April 14, 2020 End of progress report period: April 21, 2020  Today's Date: 04/21/2020 OT Individual Time: 4562-5638 OT Individual Time Calculation (min): 57 min    Patient has met 0 of 3 short term goals.  Pt continues to require Mod A for UB dress/bathe with hemi techniques, Max A for LB dressing and bathing from sit to stand level, Max-total A for toileting tasks and hygiene. Pt has improved squat pivots to/from toilet with no BSC (pt does not like and likes shorter surface d/t her height) with Mod A with knee block. RUE continues to have trace to no movement, no functional use at this time. Pt is speaking and verbally communicating more which is great. Continues to have decreased awareness and safety during ADL tasks. Family ed is scheduled for this upcoming week.   Patient continues to demonstrate the following deficits: muscle weakness, decreased cardiorespiratoy endurance, impaired timing and sequencing, abnormal tone, unbalanced muscle activation, decreased coordination and decreased motor planning, decreased attention, decreased awareness, decreased problem solving, decreased safety awareness and delayed processing and decreased sitting balance, decreased standing balance, decreased postural control, hemiplegia and decreased balance strategies and therefore will continue to benefit from skilled OT intervention to enhance overall performance with BADL and Reduce care partner burden.  Patient not progressing toward long term goals.  See goal revision..  Plan of care revisions: Downgraded goals as appropriate..  OT Short Term Goals Week 3:  OT Short Term Goal 1 (Week 3): Pt will don pants with Mod A with sit to stand to don over hips OT Short Term Goal 1 - Progress (Week 3): Progressing toward goal OT Short Term Goal 2 (Week  3): Pt will perform sit to stand consistently with Min A of 1 in prep for LB ADL OT Short Term Goal 2 - Progress (Week 3): Progressing toward goal OT Short Term Goal 3 (Week 3): Pt will perform don shirt with hemidressing technique no more than Min A OT Short Term Goal 3 - Progress (Week 3): Progressing toward goal Week 4:  OT Short Term Goal 1 (Week 4): STGs = LTGs d/t ELOS  Skilled Therapeutic Interventions/Progress Updates:    Pt greeted at time of session semireclined in bed agreeable to OT session, seeming more down this am than usual but unclear reason. No s/s of pain and thumbs down by pt when asked. Supine to sit EOB Mod A, Squat pivot bed > w/c Mod A cues to fully pivot buttocks over to L side. Min/Mod self propel to bathroom as pt kept saying "dirty" which is her way of saying she has been incontinent and needs to be cleaned. Also kept saying "bathroom" today. Stand pivot with grab bar Mod A to toilet, continent void of urine, BM incontinent in brief. Multiple sit to stands from toilet level with R knee block (decreased from previous sessions) with Min A with use of grab bar. Total A toileting today with pt in standing, assist to doff wet soaked brief and assist for hygiene from BM in standing, note pt has L lean in standing around toilet trying to support on grab bar, cues to weight shift. Pt wanting to change brief again stating it was "dirty" but was clean, changed brief in same manner as above. UB dress don/doff Mod A with hemidressing, Max A for LB dress for pants with hemidressing as  well. Pt was initially agreeable to washing hair in sink but when out of bathroom, declined in favor of eating breakfast which she performed with set up. Up in chair with alarm on, call bell in reach.   Therapy Documentation Precautions:  Precautions Precautions: Fall Precaution Comments: NG, dense R Hemi, trach, aphasic Restrictions Weight Bearing Restrictions: (P) No   Therapy/Group: Individual  Therapy  Viona Gilmore 04/21/2020, 8:22 AM

## 2020-04-22 ENCOUNTER — Ambulatory Visit (HOSPITAL_COMMUNITY): Payer: Medicaid Other

## 2020-04-22 ENCOUNTER — Inpatient Hospital Stay (HOSPITAL_COMMUNITY): Payer: Medicaid Other | Admitting: Speech Pathology

## 2020-04-22 ENCOUNTER — Encounter (HOSPITAL_COMMUNITY): Payer: Medicaid Other | Admitting: Occupational Therapy

## 2020-04-22 LAB — GLUCOSE, CAPILLARY
Glucose-Capillary: 146 mg/dL — ABNORMAL HIGH (ref 70–99)
Glucose-Capillary: 168 mg/dL — ABNORMAL HIGH (ref 70–99)
Glucose-Capillary: 171 mg/dL — ABNORMAL HIGH (ref 70–99)
Glucose-Capillary: 173 mg/dL — ABNORMAL HIGH (ref 70–99)
Glucose-Capillary: 198 mg/dL — ABNORMAL HIGH (ref 70–99)
Glucose-Capillary: 95 mg/dL (ref 70–99)
Glucose-Capillary: 98 mg/dL (ref 70–99)

## 2020-04-22 NOTE — Progress Notes (Signed)
Orthopedic Tech Progress Note Patient Details:  Ryenn Howeth Nov 12, 1970 [id number]  Ortho Devices Type of Ortho Device: Ankle Air splint Ortho Device/Splint Location: RLE Ortho Device/Splint Interventions: Ordered, Application, Adjustment   Post Interventions Patient Tolerated: Well Instructions Provided: Care of device, Adjustment of device, Poper ambulation with device   Tally Mattox 04/22/2020, 8:10 PM

## 2020-04-22 NOTE — Plan of Care (Signed)
  Problem: Consults Goal: RH STROKE PATIENT EDUCATION Description: See Patient Education module for education specifics  Outcome: Progressing Goal: Diabetes Guidelines if Diabetic/Glucose > 140 Description: If diabetic or lab glucose is > 140 mg/dl - Initiate Diabetes/Hyperglycemia Guidelines & Document Interventions  Outcome: Progressing   Problem: RH BOWEL ELIMINATION Goal: RH STG MANAGE BOWEL WITH ASSISTANCE Description: STG Manage Bowel with min to mod Assistance. Outcome: Progressing Goal: RH STG MANAGE BOWEL W/MEDICATION W/ASSISTANCE Description: STG Manage Bowel with Medication with min to mod Assistance. Outcome: Progressing   Problem: RH BLADDER ELIMINATION Goal: RH STG MANAGE BLADDER WITH ASSISTANCE Description: STG Manage Bladder With min to mod Assistance Outcome: Progressing Goal: RH STG MANAGE BLADDER WITH MEDICATION WITH ASSISTANCE Description: STG Manage Bladder With Medication With  min to mod Assistance. Outcome: Progressing   Problem: RH SKIN INTEGRITY Goal: RH STG MAINTAIN SKIN INTEGRITY WITH ASSISTANCE Description: STG Maintain Skin Integrity With min to mod Assistance. Outcome: Progressing Goal: RH STG ABLE TO PERFORM INCISION/WOUND CARE W/ASSISTANCE Description: STG Able To Perform Incision/Wound Care With  min to mod Assistance. Outcome: Progressing   Problem: RH SAFETY Goal: RH STG ADHERE TO SAFETY PRECAUTIONS W/ASSISTANCE/DEVICE Description: STG Adhere to Safety Precautions With min to mod Assistance/Device. Outcome: Progressing   Problem: RH COGNITION-NURSING Goal: RH STG ANTICIPATES NEEDS/CALLS FOR ASSIST W/ASSIST/CUES Description: STG Anticipates Needs/Calls for Assist With min to mod Assistance and Cues. Outcome: Progressing   Problem: RH PAIN MANAGEMENT Goal: RH STG PAIN MANAGED AT OR BELOW PT'S PAIN GOAL Description: <3 on a 0-10 pain scale. Outcome: Progressing   Problem: RH KNOWLEDGE DEFICIT Goal: RH STG INCREASE KNOWLEDGE OF  DIABETES Description: Patient will demonstrate knowledge of diabetes management, medication management, blood sugar control, and follow up care with the MD with min to mod assist from CIR staff. Outcome: Progressing Goal: RH STG INCREASE KNOWLEDGE OF STROKE PROPHYLAXIS Description: Patient will be able to demonstrate knowledge on medications used and taken to aid in prevention of future strokes with min to mod assist from CIR staff. Outcome: Progressing   

## 2020-04-22 NOTE — Progress Notes (Signed)
Occupational Therapy Session Note  Patient Details  Name: Deborah [name] MRN: [id number] Date of Birth: [birth year]  Today's Date: 04/22/2020 OT Individual Time: 1401-1501 OT Individual Time Calculation (min): 60 min    Skilled Therapeutic Interventions/Progress Updates:    Pt greeted in the hallway via PT handoff, son Deborah [name] and SO Deborah [name] present. Pt appearing very tired, required encouragement to continue with family education vs return to bed. Discussed bathroom setup at home with family confirming that bathroom is w/c accessible. Deborah [name] is considering bolting toilet bars to the floor beside toilet. They also have a tub shower and OT showed them what a TTB looked like. They will benefit from practicing shower transfers at a later date to assess if showering is safe to attempt with family at home vs sponge bathe. Son Deborah [name] completed 1 stand pivot transfer to the toilet with pt (without using the grab bar), vcs from OT regarding blocking her Rt knee and protecting her flaccid UE. OT provided Min A for this transfer also for safety. CGA from OT for transfer back to the w/c while son provided at least Mod A. Pt and family would benefit from additional practice when pt is not so fatigued. Son also assisted pt with transfer back to bed, requiring cues for safe setup of the w/c and also body mechanics. Pt donned her shirt with SO, SO exhibiting good carryover of hemi techniques. Once pt returned to bed, OT provided family with a paper printout of passive ROM for the affected Rt UE. Son Deborah [name] had hands on practice facilitating ROM exercises, instructing pt to visually attend to her affected side and count aloud while he assisted her with maintaining stretches. Education emphasis placed on gentle stretches in pain-free end ranges and joint protection. Both SO and Deborah [name] assisted pt with using the yes/no communication board throughout session given min cuing. At end of session pt remained in bed with all needs  within reach and hemiplegic side protected. Issued her an elbow protector as well. Bed alarm set.   Therapy Documentation Precautions:  Precautions Precautions: Fall Precaution Comments: NG, dense R Hemi, trach, aphasic Restrictions Weight Bearing Restrictions: No Vital Signs: Therapy Vitals Temp: 98 F (36.7 C) Temp Source: Oral Pulse Rate: 82 Resp: 18 BP: (!) 107/48 Patient Position (if appropriate): Lying Oxygen Therapy SpO2: 97 % O2 Device: Room Air Pain: no c/o pain during session, just fatigue    ADL: ADL Eating: NPO Upper Body Bathing: Moderate assistance, Maximal cueing Where Assessed-Upper Body Bathing: Edge of bed Lower Body Bathing: Dependent Where Assessed-Lower Body Bathing: Edge of bed Upper Body Dressing: Maximal assistance Where Assessed-Upper Body Dressing: Edge of bed Lower Body Dressing: Dependent Toileting: Not assessed      Therapy/Group: Individual Therapy  Sol Odor A Danialle Dement 04/22/2020, 4:50 PM

## 2020-04-22 NOTE — Progress Notes (Signed)
Physical Therapy Weekly Progress Note  Patient Details  Name: Deborah [name] MRN: [id number] Date of Birth: April 10, 1971  Beginning of progress report period: April 15, 2020 End of progress report period: April 22, 2020  Today's Date: 04/22/2020 PT Individual Time: 1300-1400 PT Individual Time Calculation (min): 60 min   Patient has met 1 of 4 short term goals.  Patient with limited progress this week due to continued significant R sided neglect, poor frustration tolerance and decreased activity tolerance.  She has made progress medically no longer on continuous pulse oximetry and had tracheostomy decannulated.  Plans are for removal of coretrack and for PEG placement.  Son and significant other initiated caregiver training this session.  Patient's LTG's were updated for overall mod A at wheelchair level.    Patient continues to demonstrate the following deficits impaired timing and sequencing, abnormal tone and decreased coordination, right side neglect and decreased motor planning and decreased attention, decreased awareness, decreased problem solving, decreased safety awareness and decreased memory and therefore will continue to benefit from skilled PT intervention to increase functional independence with mobility.  Patient not progressing toward long term goals.  See goal revision..  Continue plan of care.  PT Short Term Goals Week 3:  PT Short Term Goal 1 (Week 3): Patient to ambulate 35' with mod A and LRAD PT Short Term Goal 1 - Progress (Week 3): Not met PT Short Term Goal 2 (Week 3): Patient to negotiate 2 steps with mod A with rail PT Short Term Goal 2 - Progress (Week 3): Not met PT Short Term Goal 3 (Week 3): Patient to propel w/c x 64' with S PT Short Term Goal 3 - Progress (Week 3): Not met PT Short Term Goal 4 (Week 3): Patient to perform car transfer with mod A Week 4:  PT Short Term Goal 1 (Week 4): STG = LTG due to ELOS  Skilled Therapeutic Interventions/Progress  Updates:    Patient in supine with soiled brief wearing hospital gown.  Supine to sit min A for R LE and increased time cues for R side scooting.  Patient's son and significant other arrived for caregiver training.  Patient pivot to w/c mod cues and mod A.  Assist into bathroom in w/c and son and significant other present for education on toilet transfer to commode using grabbar.  They discussed possibly stopping by hardware store today to purchase grabbar for bathroom.  Patient stand pivot to toilet mod A with max A for clothing management.  Patient able to perform hygiene while seated.  Mod A to transfer to w/c.  Patient propelled w/c x 60' with min A for steering with max cues for L foot usage for steering w/c.  Educated son and significant other in w/c parts management, how pt can self propel with min A and cues.  Patient in gym and educated/demonstated son and significant other in bed/chair transfers including R UE and LE management for safety, their body positioning, w/c positioning and set up and stand pivot transfers.  Both son and significant other practiced assisting pt with transfer with PT giving cues about body positioning and allowing pt to lean forward enough so they don't have to pick her up.  Patient sit to supine on mat with min A for R LE, supine to sit mod A help for leg and trunk and educated family in level of assist for bed mobility, using pillows to elevate HOB and under R arm.  Significant other asking about using sling for  R UE and advised not to use unless no better way to manage her arm to avoid stiffening, pull on her neck and to allow for increased R side awareness.  Patient performed car transfer to simulated [employer] height (son's car, significant other has large [employer] and advised pt could likely not get in without heavy lifting).  Patient performed with max cues NOT to use door for hand support and to reach for seat, mod A to L into car.  Max A back to w/c initially pt  holding door and L thumb pinched in hinge with pt crying, but noted no abrasion.  Further educated not to use door to stabilize and pt placed her L arm around PT for pivot to w/c.  Son performed car transfer with PT assist and cues for R side management as coming back out of car noted R foot under pt when placing feet on the ground and R leg flexed under her after transfer to w/c.  Out of time for significant other to practice, but they plan to return for session tomorrow as well.  Handoff to OT in hallway for further caregiver education.   Therapy Documentation Precautions:  Precautions Precautions: Fall Precaution Comments: NG, dense R Hemi, trach, aphasic Restrictions Weight Bearing Restrictions: No   Therapy/Group: Individual Therapy  Reginia Naas  Magda Kiel, PT 04/22/2020, 10:24 AM

## 2020-04-22 NOTE — Progress Notes (Signed)
[facility] PHYSICAL MEDICINE & REHABILITATION PROGRESS NOTE   Subjective/Complaints:   Pt was decannulated yesterday- breathing well.  PEG is planned- since has Cortrak, however waiting for time off Plavix- 3-5 days is planned- not sure IR's exact plan.    ROS: limited due to aphasia   Objective:   CT ABDOMEN WO CONTRAST  Result Date: 04/20/2020 CLINICAL DATA:  Evaluate anatomy prior to potential percutaneous gastrostomy tube placement. EXAM: CT ABDOMEN WITHOUT CONTRAST TECHNIQUE: Multidetector CT imaging of the abdomen was performed following the standard protocol without IV contrast. COMPARISON:  None. FINDINGS: The lack of intravenous contrast limits the ability to evaluate solid abdominal organs. Lower chest: Limited visualization of the lower thorax demonstrates minimal dependent subpleural ground-glass atelectasis, right greater than left. No discrete focal airspace opacities. No pleural effusion Normal heart size. There is diffuse decreased attenuation of the intra cardiac blood pool suggestive of anemia. No pericardial effusion. Hepatobiliary: Normal hepatic contour. Normal noncontrast appearance of the gallbladder given degree distention. No radiopaque gallstones. No ascites. Pancreas: Normal noncontrast appearance of the pancreas. Spleen: Normal noncontrast appearance spleen. Note is made of a small splenule about the splenic hilum. 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 No evidence of right-sided nephrolithiasis or urinary obstruction. Normal noncontrast appearance the bilateral adrenal glands. The urinary bladder was not imaged. Stomach/Bowel: The anterior wall of the stomach is well apposed against the ventral wall of the upper abdomen without interposition of the hepatic parenchyma or the transverse colon and the percutaneous window will likely be improved with gastric  insufflation. Enteric tube tip terminates within the gastric antrum. Large colonic stool burden without evidence of enteric obstruction. No pneumoperitoneum, pneumatosis or portal venous gas. Vascular/Lymphatic: Moderate amount calcified atherosclerotic plaque within normal caliber abdominal aorta. No bulky retroperitoneal or mesenteric lymphadenopathy on this noncontrast examination. Other: Ill-defined subcutaneous stranding and scattered foci of subcutaneous emphysema involving the ventral aspect of the abdominal pannus likely the location of subcutaneous medication administration. Note is made of 2 adjacent narrow neck mesenteric fat containing midline ventral wall hernias (representative sagittal image 63, series 7). There is a minimal amount of subcutaneous edema about the midline of the low back. Musculoskeletal: No acute or aggressive osseous abnormalities. IMPRESSION: 1. Gastric anatomy amenable to potential percutaneous gastrostomy tube placement as indicated. 2. Large colonic stool burden without evidence of enteric obstruction. 3. 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 No evidence of right-sided urinary obstruction. 4. Aortic Atherosclerosis (ICD10-I70.0). Electronically Signed   By: Simonne Come M.D.   On: 04/20/2020 14:01   No results for input(s): WBC, HGB, HCT, PLT in the last 72 hours. No results for input(s): NA, K, CL, CO2, GLUCOSE, BUN, CREATININE, CALCIUM in the last 72 hours.  Intake/Output Summary (Last 24 hours) at 04/22/2020 0851 Last data filed at 04/22/2020 0832 Gross per 24 hour  Intake 360 ml  Output 0 ml  Net 360 ml        Physical Exam: Vital Signs Blood pressure (!) 101/48, pulse 83, temperature 98.1 F (36.7 C), resp. rate 16, height 5' (1.524 m), weight 89.1 kg, SpO2 95 %. Constitutional: No distress . Vital signs reviewed. Pt sitting up in bed- appropriate, aphasic, NAD HEENT: EOMI, oral membranes moist, Cortrak in  place- no change Neck: supple, trach OUT- decannulated Cardiovascular:RRR Respiratory/Chest: CTA B/L- no W/R/R- good air movement- sounds great! GI/Abdomen: Soft, NT, ND, (+)BS    Psych: more  garbled this AM- couldn't understand more than 1-2 words Musc: No edema in extremities.  No tenderness in extremities. Neuro: Awakens easily. Severe expressive aphasia. Follows simple commands- kept perseverating on "yes" this AM Motor: Limited due to participation, but spontaneously moving left side, no movement noted on right side, no R side movement- moving L side well No abnl tone   Assessment/Plan: 1. Functional deficits secondary to L MCA stroke with aphasia, and R heimplegia  which require 3+ hours per day of interdisciplinary therapy in a comprehensive inpatient rehab setting.  Physiatrist is providing close team supervision and 24 hour management of active medical problems listed below.  Physiatrist and rehab team continue to assess barriers to discharge/monitor patient progress toward functional and medical goals  Care Tool:  Bathing    Body parts bathed by patient: Chest, Abdomen, Right upper leg, Left upper leg, Face, Front perineal area, Right lower leg, Left lower leg, Right arm, Buttocks   Body parts bathed by helper: Left arm, Buttocks     Bathing assist Assist Level: Moderate Assistance - Patient 50 - 74% (Min-Mod)     Upper Body Dressing/Undressing Upper body dressing   What is the patient wearing?: Pull over shirt    Upper body assist Assist Level: Moderate Assistance - Patient 50 - 74%    Lower Body Dressing/Undressing Lower body dressing      What is the patient wearing?: Pants, Underwear/pull up     Lower body assist Assist for lower body dressing: Maximal Assistance - Patient 25 - 49%     Toileting Toileting Toileting Activity did not occur (Clothing management and hygiene only): N/A (no void or bm)  Toileting assist Assist for toileting: Total Assistance  - Patient < 25%     Transfers Chair/bed transfer  Transfers assist     Chair/bed transfer assist level: Maximal Assistance - Patient 25 - 49%     Locomotion Ambulation   Ambulation assist   Ambulation activity did not occur: Safety/medical concerns (due to patient fatigue/weakness)  Assist level: 2 helpers Assistive device: Walker-platform (& walking sling on maxisky) Max distance: 4   Walk 10 feet activity   Assist  Walk 10 feet activity did not occur: Safety/medical concerns (due to patient fatigue/weakness)  Assist level: 2 helpers Assistive device: Lite Gait   Walk 50 feet activity   Assist Walk 50 feet with 2 turns activity did not occur: Safety/medical concerns (due to patient fatigue/weakness)         Walk 150 feet activity   Assist Walk 150 feet activity did not occur: Safety/medical concerns (due to patient fatigue/weakness)         Walk 10 feet on uneven surface  activity   Assist Walk 10 feet on uneven surfaces activity did not occur: Safety/medical concerns (due to patient fatigue/weakness)         Wheelchair     Assist Will patient use wheelchair at discharge?: Yes Type of Wheelchair: Manual    Wheelchair assist level: Minimal Assistance - Patient > 75% Max wheelchair distance: 760'    Wheelchair 50 feet with 2 turns activity    Assist        Assist Level: Minimal Assistance - Patient > 75%   Wheelchair 150 feet activity     Assist      Assist Level: Dependent - Patient 0%   Blood pressure (!) 101/48, pulse 83, temperature 98.1 F (36.7 C), resp. rate 16, height 5' (1.524 m), weight 89.1 kg, SpO2 95 %.  Medical Problem List and Plan: 1.  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  2.  Antithrombotics: -DVT/anticoagulation: Lovenox             -antiplatelet therapy: Plavix 75 mg daily 3. Pain Management: Tylenol as needed.   10/25- controlled- con't regimen  10/26- taped R shoulder to help with pain- will see how does- by OT  11/5- no complaints of pain/signs- con't tylenol prn 4. Mood: Provide emotional support             -antipsychotic agents: N/A 5. Neuropsych: This patient is not capable of making decisions on her own behalf. 6. Skin/Wound Care: Routine skin checks 7. Fluids/Electrolytes/Nutrition: Routine in and outs. 8.  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  Currently with a #4 cuffless trach as of 03/28/2020. Continue PMV as tolerated. Follow-up speech therapy. Tolerating room air.  9.  Post-stroke Dysphagia.    D1 nectars initially- now D1 thins  Continue tube feeds  Freewater flushes increased to 250 mL every 3 hours, has normal echo  Labs ordered for tomorrow  10/25- BUN 24- slightly elevated-   10/28- D1 thins now-   10/30-31 eating around 25-50% meals   -continue TF  11/2- con't TFs- still a little dry- esp needs water- will call family about possible PEG< since cannot go home with NGT.   11/3- called family- got con's permission to go ahead with PEG- placed IR consult (PA)  11/4- waiting to get PEG_ CT of abd/pelvis done  11/5- IT waiting for time off Plavix to do PEG- likely Monday- will monitor 10. Uncontrolled diabetes mellitus with hyperglycemia.  Hemoglobin A1c 10.7.  NovoLog 10 units every 4 hours, Lantus insulin 24 units twice daily.   CBG (last 3)  Recent Labs    04/22/20 0003 04/22/20 0419 04/22/20 0813  GLUCAP 168* 171* 173*   Slightly elevated on 10/24, likely to be component as well  11/4- BGs stable -con't regimen  11/5- BGs look better- changed to TID Novolog- Lantus BID- con't regimen 11.  Urinary retention.  Urecholine 10 mg 3 times daily.    Flomax started on 10/23            PVRs  previously with retention 12.  Hyperlipidemia. Lipitor 13.  Tobacco abuse. Counseled on appropriate 14. Leukocytosis             WBCs 10.8 on 10/18, labs ordered for tomorrow  10/25- WBC 11.2- will check U/A and Cx to make sure doesn't have UTI  10/26- no UTI per U/A results  10/28- per son and pt when asked, "she's always had a higher WBC"- might need outpt w/u but nothing to worry about for ID-wise, here.  15. Acute blood loss anemia:   Hgb 11.2 on 10/18, labs ordered for tomorrow 16. Constipation:   Improved 17. Hypotension:   11/4- BP controlled- con't regimen 18. Trach for previous resp failure  10/30-31--no issues, capped   -dc tomorrow potentially per primary team  11/1- had lower sats 90-92%- might have been mucus plug that resp got out? CXR shows nothing specific- MAYBE small L pleural effusion.  No fever, no increase in WBC above baseline. Will NOT take out trach, like planned and will put cap back on and monitor  11/2- wait to remove trach- sats 93% RA- but used to be 96% and above.  11/3- sats 96% and above- no suctioning yesterday= if OK, will remove trach tomorrow, if still doing OK.  11/4- will see if can decannulate today- call resp.  11/5- decannulated PEG    19. Dispo  11/2- will call family about PEG- because not eating well.   11/3- d/w family- will get PEG.    LOS: 23 days A FACE TO FACE EVALUATION WAS PERFORMED  [facility] 04/22/2020, 8:51 AM

## 2020-04-22 NOTE — Progress Notes (Signed)
Interventional Radiology Brief Note:  IR aware of request for gastrostomy tube placement.  Patient has been approved for percutaneous attempt in IR.  Plan made for Plavix hold x5 days.  PA attempted to complete consult today, however patient undergoing family education.  Will re-attempt early next week for possible placement.  Last dose was given 11/3.   Loyce Dys, MS RD PA-C 3:55 PM

## 2020-04-22 NOTE — Plan of Care (Signed)
Long Term Goals updated as noted below: Problem: RH Balance Goal: LTG Patient will maintain dynamic standing balance (PT) Description: LTG:  Patient will maintain dynamic standing balance with assistance during mobility activities (PT) Flowsheets (Taken 04/22/2020 1417) LTG: Pt will maintain dynamic standing balance during mobility activities with:: (with UE support (downgraded due to slow progress)) Minimal Assistance - Patient > 75%   Problem: Sit to Stand Goal: LTG:  Patient will perform sit to stand with assistance level (PT) Description: LTG:  Patient will perform sit to stand with assistance level (PT) Flowsheets (Taken 04/22/2020 1417) LTG: PT will perform sit to stand in preparation for functional mobility with assistance level: (Downgraded due to slow progress (R neglect)) Minimal Assistance - Patient > 75%   Problem: RH Bed Mobility Goal: LTG Patient will perform bed mobility with assist (PT) Description: LTG: Patient will perform bed mobility with assistance, with/without cues (PT). Flowsheets (Taken 04/22/2020 1417) LTG: Pt will perform bed mobility with assistance level of: (downgraded due to slow progress (R neglect)) Minimal Assistance - Patient > 75%   Problem: RH Bed Mobility Goal: LTG Patient will perform bed mobility with assist (PT) Description: LTG: Patient will perform bed mobility with assistance, with/without cues (PT). Flowsheets (Taken 04/22/2020 1417) LTG: Pt will perform bed mobility with assistance level of: (downgraded due to slow progress (R neglect)) Minimal Assistance - Patient > 75%   Problem: RH Bed Mobility Goal: LTG Patient will perform bed mobility with assist (PT) Description: LTG: Patient will perform bed mobility with assistance, with/without cues (PT). Flowsheets (Taken 04/22/2020 1417) LTG: Pt will perform bed mobility with assistance level of: (downgraded due to slow progress (R neglect)) Minimal Assistance - Patient > 75%   Problem: RH Bed to  Chair Transfers Goal: LTG Patient will perform bed/chair transfers w/assist (PT) Description: LTG: Patient will perform bed to chair transfers with assistance (PT). Flowsheets (Taken 04/22/2020 1417) LTG: Pt will perform Bed to Chair Transfers with assistance level: (downgraded due to slow progress (R neglect)) Moderate Assistance - Patient 50 - 74%   Problem: RH Car Transfers Goal: LTG Patient will perform car transfers with assist (PT) Description: LTG: Patient will perform car transfers with assistance (PT). Flowsheets (Taken 04/22/2020 1417) LTG: Pt will perform car transfers with assist:: (downgraded due to slow progress (R neglect)) Moderate Assistance - Patient 50 - 74%   Problem: RH Furniture Transfers Goal: LTG Patient will perform furniture transfers w/assist (OT/PT) Description: LTG: Patient will perform furniture transfers  with assistance (OT/PT). Flowsheets (Taken 04/22/2020 1417) LTG: Pt will perform furniture transfers with assist:: (downgraded due to slow progress (R neglect)) Moderate Assistance - Patient 50 - 74%   Problem: RH Ambulation Goal: LTG Patient will ambulate in controlled environment (PT) Description: LTG: Patient will ambulate in a controlled environment, # of feet with assistance (PT). Flowsheets (Taken 04/22/2020 1417) LTG: Pt will ambulate in controlled environ  assist needed:: (Downgraded due to slow progress (R neglect)) Maximal Assistance - Patient 25 - 49% LTG: Ambulation distance in controlled environment: 10   Problem: RH Ambulation Goal: LTG Patient will ambulate in home environment (PT) Description: LTG: Patient will ambulate in home environment, # of feet with assistance (PT). Outcome: Not Applicable Flowsheets (Taken 04/22/2020 1417) LTG: Pt will ambulate in home environ  assist needed:: (goal discontinued due to slow progress, not safe for home ambulation) --   Problem: RH Wheelchair Mobility Goal: LTG Patient will propel w/c in controlled  environment (PT) Description: LTG: Patient will propel wheelchair in  controlled environment, # of feet with assist (PT) Flowsheets (Taken 04/22/2020 1417) LTG: Pt will propel w/c in controlled environ  assist needed:: (downgraded due to slow progress (poor frustration tolerance)) Minimal Assistance - Patient > 75% LTG: Propel w/c distance in controlled environment: 50   Problem: RH Stairs Goal: LTG Patient will ambulate up and down stairs w/assist (PT) Description: LTG: Patient will ambulate up and down # of stairs with assistance (PT) Outcome: Not Applicable Flowsheets (Taken 04/22/2020 1417) LTG: Pt will ambulate up/down stairs assist needed:: (Goal discontinued due to slow progress, not safe for stair negotiation) --  [name], PT

## 2020-04-22 NOTE — Progress Notes (Signed)
Speech Language Pathology Weekly Progress and Session Note  Patient Details  Name: Deborah [name] MRN: [id number] Date of Birth: June 07, 1971  Beginning of progress report period: April 16, 2020 End of progress report period: April 22, 2020  Today's Date: 04/22/2020 SLP Individual Time: 1100-1200 SLP Individual Time Calculation (min): 60 min  Short Term Goals: Week 3: SLP Short Term Goal 1 (Week 3): Patient will point to identify basic level verb/actions (eat, sleep, etc) in field of two with 80% accuracy. SLP Short Term Goal 1 - Progress (Week 3): Not met SLP Short Term Goal 2 (Week 3): Patient will manage left anterior spillage and oral pocketing of solids with modA and use of mirror for awareness. SLP Short Term Goal 2 - Progress (Week 3): Met SLP Short Term Goal 3 (Week 3): Patient will imitate at multisyllabic word and functional phrase level with 75% accuracy and modA. SLP Short Term Goal 3 - Progress (Week 3): Met SLP Short Term Goal 4 (Week 3): Patient will use communication board with 4-6 field choices to request basic needs/wants as well as with task specific communication boards, with minA. SLP Short Term Goal 4 - Progress (Week 3): Partly met SLP Short Term Goal 5 (Week 3): Patient will tolerate trials of Dysphagia 2 solids with minimal difficulty managing at oral phase and minimal A cues. SLP Short Term Goal 5 - Progress (Week 3): Met SLP Short Term Goal 6 (Week 3): Patient will respond to basic level yes/no questions (verbally, head nods, yes/no board pointing) with 80% accuracy. SLP Short Term Goal 6 - Progress (Week 3): Met    New Short Term Goals: Week 4: SLP Short Term Goal 1 (Week 4): STG's=LTG's (anticipated discharge 11/10)  Weekly Progress Updates:  Patient made good progress and met 4/6 STG's. She was upgraded to Dys 2 solids today but continues with poor appetite and PEG is anticipated per MD notes. Patient has improved with consistency of yes/no via thumbs  up and down and is able to imitate at multisyllabic word level with significantly improved accuracy. Naming continues to be very limited overall and she exhibits perseverations and poor frustration tolerance with expressive language tasks.    Intensity: Minumum of 1-2 x/day, 30 to 90 minutes Frequency: 3 to 5 out of 7 days Duration/Length of Stay: 11/10 Treatment/Interventions: Cognitive remediation/compensation;Dysphagia/aspiration precaution training;Environmental controls;Cueing hierarchy;Functional tasks;Speech/Language facilitation;Patient/family education;Multimodal communication approach   Daily Session  Skilled Therapeutic Interventions: Patient seen with trial tray of upgraded Dys 2 solids and tolerated without difficulty. Patient also participated in speech-language tasks of imitating at word level, which she performed with approximately 80%, and pointing to identify objects in fields of two and three. She was 80% accurate with field of two and 70% with field of three. Patient continues to benefit from skilled SLP intervention to maximize speech-language and swallow function prior to discharge.   General    Pain Pain Assessment Pain Scale: 0-10 Pain Score: 0-No pain  Therapy/Group: Individual Therapy  Sonia Baller, MA, CCC-SLP Speech Therapy

## 2020-04-23 ENCOUNTER — Encounter (HOSPITAL_COMMUNITY): Payer: Medicaid Other | Admitting: Occupational Therapy

## 2020-04-23 LAB — GLUCOSE, CAPILLARY
Glucose-Capillary: 112 mg/dL — ABNORMAL HIGH (ref 70–99)
Glucose-Capillary: 129 mg/dL — ABNORMAL HIGH (ref 70–99)
Glucose-Capillary: 135 mg/dL — ABNORMAL HIGH (ref 70–99)
Glucose-Capillary: 146 mg/dL — ABNORMAL HIGH (ref 70–99)
Glucose-Capillary: 178 mg/dL — ABNORMAL HIGH (ref 70–99)
Glucose-Capillary: 217 mg/dL — ABNORMAL HIGH (ref 70–99)
Glucose-Capillary: 97 mg/dL (ref 70–99)

## 2020-04-23 MED ORDER — INSULIN GLARGINE 100 UNIT/ML ~~LOC~~ SOLN
26.0000 [IU] | Freq: Two times a day (BID) | SUBCUTANEOUS | Status: DC
  Administered 2020-04-23 – 2020-04-29 (×11): 26 [IU] via SUBCUTANEOUS
  Filled 2020-04-23 (×14): qty 0.26

## 2020-04-23 MED ORDER — CEFAZOLIN SODIUM-DEXTROSE 2-4 GM/100ML-% IV SOLN
2.0000 g | INTRAVENOUS | Status: AC
  Filled 2020-04-23: qty 100

## 2020-04-23 NOTE — Progress Notes (Signed)
Occupational Therapy Session Note  Patient Details  Name: Corliss Coggeshall MRN: [id number] Date of Birth: 1970-06-24  Today's Date: 04/23/2020 OT Individual Time: 1300-1400 OT Individual Time Calculation (min): 60 min    Short Term Goals: Week 2:  OT Short Term Goal 1 (Week 2): Pt will perform don shirt with hemidressing technique no more than Mod A OT Short Term Goal 1 - Progress (Week 2): Met OT Short Term Goal 2 (Week 2): Pt will perform sit to stand consistently with Min A of 1 in prep for LB ADL OT Short Term Goal 2 - Progress (Week 2): Progressing toward goal OT Short Term Goal 3 (Week 2): Pt will don pants with Mod A with sit to stand to don over hips OT Short Term Goal 3 - Progress (Week 2): Progressing toward goal OT Short Term Goal 4 (Week 2): Pt will perform seated ADLs with CS for sitting balance OT Short Term Goal 4 - Progress (Week 2): Met Week 3:  OT Short Term Goal 1 (Week 3): Pt will don pants with Mod A with sit to stand to don over hips OT Short Term Goal 1 - Progress (Week 3): Progressing toward goal OT Short Term Goal 2 (Week 3): Pt will perform sit to stand consistently with Min A of 1 in prep for LB ADL OT Short Term Goal 2 - Progress (Week 3): Progressing toward goal OT Short Term Goal 3 (Week 3): Pt will perform don shirt with hemidressing technique no more than Min A OT Short Term Goal 3 - Progress (Week 3): Progressing toward goal Week 4:  OT Short Term Goal 1 (Week 4): STGs = LTGs d/t ELOS Week 5:     Skilled Therapeutic Interventions/Progress Updates:    Focus on fam education with son and husband. Husband did the hands on training today. Practiced and performed hands on training with toileting, toilet transfers, car transfer from w/c. Safety with transfers using a gait belt and positioning of hemi right UE. Also discussed wearing shoes with all transfers to help with her foot placement. Both felt comfortable with bathing and dressing. Extensive conversation  about showering at home. At this time decided to wait since access to the shower is very limited due to one doorway and the other bathroom's size of shower with needed DME. Encourage and promoted NO trying to walk or carry her in the house. Family wanted ot see her take some steps - attempted to in the hallway both at the rail and with the platform RW but pt going to fast and only wanting to take 3 steps and then wanting to sit. Family will be back on Tuesday at 1 for fam education.   Therapy Documentation Precautions:  Precautions Precautions: Fall Precaution Comments: NG, dense R Hemi, trach, aphasic Restrictions Weight Bearing Restrictions: No General:   Vital Signs: Therapy Vitals Temp: 99.4 F (37.4 C) Temp Source: Oral Pulse Rate: 77 Resp: 19 BP: (!) 117/58 Patient Position (if appropriate): Lying Oxygen Therapy SpO2: 98 % O2 Device: Room Air Pain:  c/o of right Ue pain with elevation - able to reposition   Therapy/Group: Individual Therapy  Willeen Cass [facility] 04/23/2020, 3:59 PM

## 2020-04-23 NOTE — Progress Notes (Signed)
[facility] PHYSICAL MEDICINE & REHABILITATION PROGRESS NOTE   Subjective/Complaints: No complaints this morning Nursing staff reports no issues.  BP soft.  Had BM yesterday  ROS: limited due to aphasia   Objective:   No results found. No results for input(s): WBC, HGB, HCT, PLT in the last 72 hours. No results for input(s): NA, K, CL, CO2, GLUCOSE, BUN, CREATININE, CALCIUM in the last 72 hours.  Intake/Output Summary (Last 24 hours) at 04/23/2020 1003 Last data filed at 04/22/2020 2132 Gross per 24 hour  Intake 660 ml  Output 0 ml  Net 660 ml        Physical Exam: Vital Signs Blood pressure (!) 105/54, pulse 81, temperature 98.6 F (37 C), resp. rate 14, height 5' (1.524 m), weight 89.3 kg, SpO2 97 %. General: Alert, No apparent distress HEENT: Head is normocephalic, +cortrak Neck: Decannulated. Heart: Reg rate and rhythm. No murmurs rubs or gallops Chest: CTA bilaterally without wheezes, rales, or rhonchi; no distress Abdomen: Soft, non-tender, non-distended, bowel sounds positive. Extremities: No clubbing, cyanosis, or edema. Pulses are 2+ Skin: Clean and intact without signs of breakdown  Psych: more garbled this AM- couldn't understand more than 1-2 words Musc: No edema in extremities.  No tenderness in extremities. Neuro: Awakens easily. Severe expressive aphasia. Follows simple commands- kept perseverating on "yes" this AM Motor: Limited due to participation, but spontaneously moving left side, no movement noted on right side, no R side movement- moving L side well No abnl tone   Assessment/Plan: 1. Functional deficits secondary to L MCA stroke with aphasia, and R heimplegia  which require 3+ hours per day of interdisciplinary therapy in a comprehensive inpatient rehab setting.  Physiatrist is providing close team supervision and 24 hour management of active medical problems listed below.  Physiatrist and rehab team continue to assess barriers to  discharge/monitor patient progress toward functional and medical goals  Care Tool:  Bathing    Body parts bathed by patient: Chest, Abdomen, Right upper leg, Left upper leg, Face, Front perineal area, Right lower leg, Left lower leg, Right arm, Buttocks   Body parts bathed by helper: Left arm, Buttocks     Bathing assist Assist Level: Moderate Assistance - Patient 50 - 74% (Min-Mod)     Upper Body Dressing/Undressing Upper body dressing   What is the patient wearing?: Pull over shirt    Upper body assist Assist Level: Moderate Assistance - Patient 50 - 74%    Lower Body Dressing/Undressing Lower body dressing      What is the patient wearing?: Pants, Underwear/pull up     Lower body assist Assist for lower body dressing: Maximal Assistance - Patient 25 - 49%     Toileting Toileting Toileting Activity did not occur (Clothing management and hygiene only): N/A (no void or bm)  Toileting assist Assist for toileting: Maximal Assistance - Patient 25 - 49%     Transfers Chair/bed transfer  Transfers assist     Chair/bed transfer assist level: Moderate Assistance - Patient 50 - 74%     Locomotion Ambulation   Ambulation assist   Ambulation activity did not occur: Safety/medical concerns (due to patient fatigue/weakness)  Assist level: 2 helpers Assistive device: Walker-platform (& walking sling on maxisky) Max distance: 4   Walk 10 feet activity   Assist  Walk 10 feet activity did not occur: Safety/medical concerns (due to patient fatigue/weakness)  Assist level: 2 helpers Assistive device: Lite Gait   Walk 50 feet activity   Assist  Walk 50 feet with 2 turns activity did not occur: Safety/medical concerns (due to patient fatigue/weakness)         Walk 150 feet activity   Assist Walk 150 feet activity did not occur: Safety/medical concerns (due to patient fatigue/weakness)         Walk 10 feet on uneven surface  activity   Assist Walk 10  feet on uneven surfaces activity did not occur: Safety/medical concerns (due to patient fatigue/weakness)         Wheelchair     Assist Will patient use wheelchair at discharge?: Yes Type of Wheelchair: Manual    Wheelchair assist level: Minimal Assistance - Patient > 75% Max wheelchair distance: 52'    Wheelchair 50 feet with 2 turns activity    Assist        Assist Level: Minimal Assistance - Patient > 75%   Wheelchair 150 feet activity     Assist      Assist Level: Dependent - Patient 0%   Blood pressure (!) 105/54, pulse 81, temperature 98.6 F (37 C), resp. rate 14, height 5' (1.524 m), weight 89.3 kg, SpO2 97 %.  Medical Problem List and Plan: 1.  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  2.  Antithrombotics: -DVT/anticoagulation: Lovenox             -antiplatelet therapy: Plavix 75 mg daily 3. Pain Management: Tylenol as needed.   10/25- controlled- con't regimen  10/26- taped R shoulder to help with pain- will see how does- by OT  11/6: pain appears to be well controlled 4. Mood: Provide emotional support             -antipsychotic agents: N/A 5. Neuropsych: This patient is not capable of making decisions on her own behalf. 6. Skin/Wound Care: Routine skin checks 7. Fluids/Electrolytes/Nutrition: Routine in and outs. 8.  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  Currently with a #4 cuffless trach as of 03/28/2020. Continue PMV as tolerated. Follow-up speech therapy. Tolerating room air.  9.  Post-stroke Dysphagia.    D1 nectars initially- now D1 thins  Continue tube feeds  Freewater flushes increased to 250 mL every 3 hours, has normal echo  Labs ordered for tomorrow  10/25- BUN 24- slightly  elevated-   10/28- D1 thins now-   10/30-31 eating around 25-50% meals   -continue TF  11/2- con't TFs- still a little dry- esp needs water- will call family about possible PEG< since cannot go home with NGT.   11/3- called family- got con's permission to go ahead with PEG- placed IR consult (PA)  11/4- waiting to get PEG_ CT of abd/pelvis done  11/5- IT waiting for time off Plavix to do PEG- likely Monday- will monitor 10. Uncontrolled diabetes mellitus with hyperglycemia.  Hemoglobin A1c 10.7.  NovoLog 10 units every 4 hours, Lantus insulin 24 units twice daily.   CBG (last 3)  Recent Labs    04/23/20 0001 04/23/20 0425 04/23/20 0818  GLUCAP 112* 178* 217*   Slightly elevated on 10/24, likely to be component as well  11/4- BGs stable -con't regimen  11/5- BGs look better- changed to TID Novolog- Lantus BID- con't regimen  11/6: CBG elevated to 217 this morning. Increased Lantus to 26U.  11.  Urinary retention.  Urecholine 10 mg 3 times daily.    Flomax started on 10/23            PVRs previously with retention 12.  Hyperlipidemia. Lipitor 13.  Tobacco abuse. Counseled on appropriate 14. Leukocytosis             WBCs 10.8 on 10/18, labs ordered for tomorrow  10/25- WBC 11.2- will check U/A and Cx to make sure doesn't have UTI  10/26- no UTI per U/A results  10/28- per son and pt when asked, "she's always had a higher WBC"- might need outpt w/u but nothing to worry about for ID-wise, here.  15. Acute blood loss anemia:   Hgb 11.2 on 10/18, labs ordered for tomorrow 16. Constipation:   Improved. Had BM 11/5 17. Hypotension:   11/4- BP controlled- con't regimen 18. Trach for previous resp failure  10/30-31--no issues, capped   -dc tomorrow potentially per primary team  11/1- had lower sats 90-92%- might have been mucus plug that resp got out? CXR shows nothing specific- MAYBE small L pleural effusion.  No fever, no increase in WBC above baseline. Will NOT take out trach, like  planned and will put cap back on and monitor  11/2- wait to remove trach- sats 93% RA- but used to be 96% and above.  11/3- sats 96% and above- no suctioning yesterday= if OK, will remove trach tomorrow, if still doing OK.  11/4- will see if can decannulate today- call resp.  11/5- decannulated PEG    19. Dispo  11/2- will call family about PEG- because not eating well.   11/3- d/w family- will get PEG.    LOS: 24 days A FACE TO FACE EVALUATION WAS PERFORMED  Arianis Bowditch P Ben Habermann 04/23/2020, 10:03 AM

## 2020-04-23 NOTE — Consult Note (Signed)
Chief Complaint: Patient was seen in consultation today for percutaneous gastric tube placement at the request of Dr Daleen Squibb   Supervising Physician: Daryll Brod  Patient Status: [facility] - In-pt  History of Present Illness: Deborah [name] is a 49 y.o. female   L MCA CVA; aphasia; Rt hemiplegia Dysphagia Need for long term care On plavix for CVA-- LD 11/3  Trach in place  Request for percutaneous gastric tube placement Imaging reviewed and anatomy approved LD Plavix 11/3  Scheduled for perc G tube in IR 11/8  Past Medical History:  Diagnosis Date  . DM (diabetes mellitus), type 2 ([place])   . Hyperlipidemia   . Hypersomnia   . Sprain of right ankle, initial encounter   . Stroke ([place])   . Syncope     Past Surgical History:  Procedure Laterality Date  . IR CT HEAD LTD  03/08/2020  . IR PERCUTANEOUS ART THROMBECTOMY/INFUSION INTRACRANIAL INC DIAG ANGIO  03/08/2020  . RADIOLOGY WITH ANESTHESIA N/A 03/08/2020   Procedure: IR WITH ANESTHESIA;  Surgeon: Luanne Bras, MD;  Location: [place];  Service: Radiology;  Laterality: N/A;    Allergies: Aspirin and Mushroom extract complex  Medications: Prior to Admission medications   Medication Sig Start Date End Date Taking? Authorizing Provider  acetaminophen (TYLENOL) 325 MG tablet Take 2 tablets (650 mg total) by mouth every 4 (four) hours as needed for mild pain (or temp > 37.5 C (99.5 F)). 03/30/20  Yes Donzetta Starch, NP  atorvastatin (LIPITOR) 80 MG tablet Place 1 tablet (80 mg total) into feeding tube daily. 03/31/20  Yes Donzetta Starch, NP  bethanechol (URECHOLINE) 10 MG tablet Place 1 tablet (10 mg total) into feeding tube 3 (three) times daily. 03/30/20  Yes Donzetta Starch, NP  chlorhexidine gluconate, MEDLINE KIT, (PERIDEX) 0.12 % solution 15 mLs by Mouth Rinse route 2 (two) times daily. 03/30/20  Yes Donzetta Starch, NP  clopidogrel (PLAVIX) 75 MG tablet Place 1 tablet (75 mg total) into feeding tube daily.  03/31/20  Yes Donzetta Starch, NP  enoxaparin (LOVENOX) 40 MG/0.4ML injection Inject 0.4 mLs (40 mg total) into the skin daily. 03/30/20  Yes Donzetta Starch, NP  insulin aspart (NOVOLOG) 100 UNIT/ML injection Inject 0-15 Units into the skin every 4 (four) hours. 03/30/20  Yes Donzetta Starch, NP  insulin aspart (NOVOLOG) 100 UNIT/ML injection Inject 10 Units into the skin every 4 (four) hours. 03/30/20  Yes Donzetta Starch, NP  insulin glargine (LANTUS) 100 UNIT/ML injection Inject 0.25 mLs (25 Units total) into the skin 2 (two) times daily. 03/30/20  Yes Donzetta Starch, NP  Multiple Vitamin (MULTIVITAMIN WITH MINERALS) TABS tablet Place 1 tablet into feeding tube daily. 03/31/20  Yes Donzetta Starch, NP  Nutritional Supplements (FEEDING SUPPLEMENT, GLUCERNA 1.5 CAL,) LIQD Place 1,000 mLs into feeding tube continuous. 03/30/20  Yes Donzetta Starch, NP  Nutritional Supplements (FEEDING SUPPLEMENT, PROSOURCE TF,) liquid Place 45 mLs into feeding tube daily. 03/31/20  Yes Donzetta Starch, NP  polyethylene glycol (MIRALAX / GLYCOLAX) 17 g packet Place 17 g into feeding tube daily. 03/31/20  Yes Donzetta Starch, NP  senna-docusate (SENOKOT-S) 8.6-50 MG tablet Place 1 tablet into feeding tube at bedtime as needed for mild constipation or moderate constipation. 03/30/20  Yes Donzetta Starch, NP  sodium chloride 0.9 % infusion Inject 50 mLs into the vein continuous. 03/30/20  Yes Donzetta Starch, NP  dextrose 50 % solution Inject 0-50 mLs  into the vein as needed for low blood sugar. 03/30/20   Donzetta Starch, NP  docusate (COLACE) 50 MG/5ML liquid Place 10 mLs (100 mg total) into feeding tube 2 (two) times daily. 03/30/20   Donzetta Starch, NP     Family History  Problem Relation Age of Onset  . Heart defect Mother        Leaky Valve  . Heart Problems Father        Enlarged heart   . Heart defect Maternal Grandmother        Leaky Valve   . Stroke Paternal Grandfather     Social History   Socioeconomic  History  . Marital status: Married    Spouse name: Not on file  . Number of children: Not on file  . Years of education: Not on file  . Highest education level: Not on file  Occupational History  . Not on file  Tobacco Use  . Smoking status: Current Every Day Smoker    Packs/day: 0.50    Types: Cigarettes  . Smokeless tobacco: Never Used  Vaping Use  . Vaping Use: Never used  Substance and Sexual Activity  . Alcohol use: Not Currently  . Drug use: Not Currently  . Sexual activity: Yes  Other Topics Concern  . Not on file  Social History Narrative  . Not on file   Social Determinants of Health   Financial Resource Strain:   . Difficulty of Paying Living Expenses: Not on file  Food Insecurity:   . Worried About Charity fundraiser in the Last Year: Not on file  . Ran Out of Food in the Last Year: Not on file  Transportation Needs:   . Lack of Transportation (Medical): Not on file  . Lack of Transportation (Non-Medical): Not on file  Physical Activity:   . Days of Exercise per Week: Not on file  . Minutes of Exercise per Session: Not on file  Stress:   . Feeling of Stress : Not on file  Social Connections:   . Frequency of Communication with Friends and Family: Not on file  . Frequency of Social Gatherings with Friends and Family: Not on file  . Attends Religious Services: Not on file  . Active Member of Clubs or Organizations: Not on file  . Attends Archivist Meetings: Not on file  . Marital Status: Not on file     Review of Systems: A 12 point ROS discussed and pertinent positives are indicated in the HPI above.  All other systems are negative.   Vital Signs: BP (!) 105/54 (BP Location: Left Arm)   Pulse 81   Temp 98.6 F (37 C)   Resp 14   Ht 5' (1.524 m)   Wt 196 lb 13.9 oz (89.3 kg)   LMP  (LMP Unknown)   SpO2 97%   BMI 38.45 kg/m   Physical Exam Constitutional:      Comments: Aphasic   HENT:     Mouth/Throat:     Mouth: Mucous  membranes are moist.  Cardiovascular:     Rate and Rhythm: Normal rate and regular rhythm.     Heart sounds: Normal heart sounds.  Pulmonary:     Effort: Pulmonary effort is normal.     Breath sounds: Normal breath sounds.  Abdominal:     Palpations: Abdomen is soft.     Tenderness: There is no abdominal tenderness.  Musculoskeletal:     Comments: Right hemiplegia  Skin:  General: Skin is warm.  Psychiatric:     Comments: Spoke to Son Deborah [name] via phone for consent     Imaging: CT ABDOMEN WO CONTRAST  Result Date: 04/20/2020 CLINICAL DATA:  Evaluate anatomy prior to potential percutaneous gastrostomy tube placement. EXAM: CT ABDOMEN WITHOUT CONTRAST TECHNIQUE: Multidetector CT imaging of the abdomen was performed following the standard protocol without IV contrast. COMPARISON:  None. FINDINGS: The lack of intravenous contrast limits the ability to evaluate solid abdominal organs. Lower chest: Limited visualization of the lower thorax demonstrates minimal dependent subpleural ground-glass atelectasis, right greater than left. No discrete focal airspace opacities. No pleural effusion Normal heart size. There is diffuse decreased attenuation of the intra cardiac blood pool suggestive of anemia. No pericardial effusion. Hepatobiliary: Normal hepatic contour. Normal noncontrast appearance of the gallbladder given degree distention. No radiopaque gallstones. No ascites. Pancreas: Normal noncontrast appearance of the pancreas. Spleen: Normal noncontrast appearance spleen. Note is made of a small splenule about the splenic hilum. 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 No evidence of right-sided nephrolithiasis or urinary obstruction. Normal noncontrast appearance the bilateral adrenal glands. The urinary bladder was not imaged. Stomach/Bowel: The anterior wall of the stomach is well  apposed against the ventral wall of the upper abdomen without interposition of the hepatic parenchyma or the transverse colon and the percutaneous window will likely be improved with gastric insufflation. Enteric tube tip terminates within the gastric antrum. Large colonic stool burden without evidence of enteric obstruction. No pneumoperitoneum, pneumatosis or portal venous gas. Vascular/Lymphatic: Moderate amount calcified atherosclerotic plaque within normal caliber abdominal aorta. No bulky retroperitoneal or mesenteric lymphadenopathy on this noncontrast examination. Other: Ill-defined subcutaneous stranding and scattered foci of subcutaneous emphysema involving the ventral aspect of the abdominal pannus likely the location of subcutaneous medication administration. Note is made of 2 adjacent narrow neck mesenteric fat containing midline ventral wall hernias (representative sagittal image 63, series 7). There is a minimal amount of subcutaneous edema about the midline of the low back. Musculoskeletal: No acute or aggressive osseous abnormalities. IMPRESSION: 1. Gastric anatomy amenable to potential percutaneous gastrostomy tube placement as indicated. 2. Large colonic stool burden without evidence of enteric obstruction. 3. 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 No evidence of right-sided urinary obstruction. 4. Aortic Atherosclerosis (ICD10-I70.0). Electronically Signed   By: Sandi Mariscal M.D.   On: 04/20/2020 14:01   DG CHEST PORT 1 VIEW  Result Date: 04/18/2020 CLINICAL DATA:  Hypoxia EXAM: PORTABLE CHEST 1 VIEW COMPARISON:  03/30/2020 FINDINGS: Tracheostomy in satisfactory position. Mild hazy opacity left lung base may reflect a small left pleural effusion, but this is equivocal. Right lung is clear. No frank interstitial edema. No pneumothorax. The heart is normal in size. Weighted feeding tube terminates in the gastric antrum. IMPRESSION: Possible small  left pleural effusion, equivocal. No frank interstitial edema. Enteric tube terminates in the gastric antrum. Tracheostomy in satisfactory position. Electronically Signed   By: Julian Hy M.D.   On: 04/18/2020 09:04   DG Chest Port 1 View  Result Date: 03/30/2020 CLINICAL DATA:  Leukocytosis EXAM: PORTABLE CHEST 1 VIEW COMPARISON:  03/19/2020 FINDINGS: Tracheostomy tube remains in place. Enteric tube courses below the diaphragm with distal tip beyond the inferior margin of the film. Stable heart size. Slightly low lung volumes. Minimal streaky bibasilar opacity. No pleural effusion or pneumothorax. IMPRESSION: Minimal streaky bibasilar opacity, atelectasis versus infiltrate. Electronically Signed  By: Davina Poke D.O.   On: 03/30/2020 11:03   DG Swallowing Func-Speech Pathology  Result Date: 04/01/2020 Objective Swallowing Evaluation: Type of Study: MBS-Modified Barium Swallow Study  Patient Details Name: Deborah [name] MRN: [id number] Date of Birth: January 16, 1971 Today's Date: 04/01/2020 Time: SLP Start Time (ACUTE ONLY): 1157 -SLP Stop Time (ACUTE ONLY): 1224 SLP Time Calculation (min) (ACUTE ONLY): 27 min Past Medical History: Past Medical History: Diagnosis Date . DM (diabetes mellitus), type 2 ([place])  . Hyperlipidemia  . Hypersomnia  . Sprain of right ankle, initial encounter  . Stroke ([place])  . Syncope  Past Surgical History: Past Surgical History: Procedure Laterality Date . IR CT HEAD LTD  03/08/2020 . IR PERCUTANEOUS ART THROMBECTOMY/INFUSION INTRACRANIAL INC DIAG ANGIO  03/08/2020 . RADIOLOGY WITH ANESTHESIA N/A 03/08/2020  Procedure: IR WITH ANESTHESIA;  Surgeon: Luanne Bras, MD;  Location: [place];  Service: Radiology;  Laterality: N/A; HPI: 48 y/o female with history of IDDM, HLD, tobacco use disorder presented [facility] for facial droop, right sided weakness and global aphasia. Found to have LVO of left M1 segment and was transferred to [facility] [place]. Pt underwent left common  carotid arteriogram followed by revascularization of L MCA, however it reoccluded due to sever distal M1 stenosis.Pt underwent trach on 9/26.  Subjective: alert, not verbal Assessment / Plan / Recommendation CHL IP CLINICAL IMPRESSIONS 04/01/2020 Clinical Impression Patient presents with a moderate oropharyngeal dysphagia with oral phase consisting of decreased bolus cohesion, premature spillage of all boluses to level of vallecular sinus and anterior spillage on right, delayed oral transit. Swallow initiation occured at level of vallecular sinus for puree solids, nectar thick liquids but at level of pyriform sinus for thin liquids. One instance of flash penetration with cup sip of nectar thick liquids and two instances of silent aspiration of trace to min amount with thin liquids during the swallow. No pharyngeal residuals observed post swallows. SLP Visit Diagnosis Dysphagia, oropharyngeal phase (R13.12) Attention and concentration deficit following -- Frontal lobe and executive function deficit following -- Impact on safety and function Mild aspiration risk   CHL IP TREATMENT RECOMMENDATION 03/23/2020 Treatment Recommendations Therapy as outlined in treatment plan below   Prognosis 04/01/2020 Prognosis for Safe Diet Advancement Good Barriers to Reach Goals -- Barriers/Prognosis Comment -- CHL IP DIET RECOMMENDATION 04/01/2020 SLP Diet Recommendations Nectar thick liquid;Dysphagia 1 (Puree) solids Liquid Administration via Cup Medication Administration Crushed with puree Compensations Minimize environmental distractions;Slow rate;Small sips/bites Postural Changes Seated upright at 90 degrees   CHL IP OTHER RECOMMENDATIONS 04/01/2020 Recommended Consults -- Oral Care Recommendations Oral care BID Other Recommendations Have oral suction available;Place PMSV during PO intake   CHL IP FOLLOW UP RECOMMENDATIONS 03/30/2020 Follow up Recommendations Inpatient Rehab   CHL IP FREQUENCY AND DURATION 03/23/2020 Speech Therapy  Frequency (ACUTE ONLY) min 2x/week Treatment Duration 2 weeks      CHL IP ORAL PHASE 04/01/2020 Oral Phase Impaired Oral - Pudding Teaspoon -- Oral - Pudding Cup -- Oral - Honey Teaspoon -- Oral - Honey Cup -- Oral - Nectar Teaspoon -- Oral - Nectar Cup Premature spillage Oral - Nectar Straw -- Oral - Thin Teaspoon Right anterior bolus loss;Weak lingual manipulation;Premature spillage Oral - Thin Cup Weak lingual manipulation;Premature spillage;Reduced posterior propulsion Oral - Thin Straw -- Oral - Puree Weak lingual manipulation;Reduced posterior propulsion;Premature spillage Oral - Mech Soft -- Oral - Regular -- Oral - Multi-Consistency -- Oral - Pill -- Oral Phase - Comment --  CHL IP PHARYNGEAL PHASE 04/01/2020 Pharyngeal Phase  Impaired Pharyngeal- Pudding Teaspoon -- Pharyngeal -- Pharyngeal- Pudding Cup -- Pharyngeal -- Pharyngeal- Honey Teaspoon -- Pharyngeal -- Pharyngeal- Honey Cup -- Pharyngeal -- Pharyngeal- Nectar Teaspoon -- Pharyngeal -- Pharyngeal- Nectar Cup Delayed swallow initiation-vallecula;Penetration/Aspiration during swallow Pharyngeal Material enters airway, remains ABOVE vocal cords then ejected out Pharyngeal- Nectar Straw -- Pharyngeal -- Pharyngeal- Thin Teaspoon Delayed swallow initiation-pyriform sinuses Pharyngeal Material does not enter airway Pharyngeal- Thin Cup Delayed swallow initiation-pyriform sinuses;Reduced airway/laryngeal closure;Penetration/Aspiration during swallow;Reduced epiglottic inversion Pharyngeal Material enters airway, passes BELOW cords without attempt by patient to eject out (silent aspiration) Pharyngeal- Thin Straw -- Pharyngeal -- Pharyngeal- Puree Delayed swallow initiation-vallecula Pharyngeal -- Pharyngeal- Mechanical Soft -- Pharyngeal -- Pharyngeal- Regular -- Pharyngeal -- Pharyngeal- Multi-consistency -- Pharyngeal -- Pharyngeal- Pill -- Pharyngeal -- Pharyngeal Comment --  CHL IP CERVICAL ESOPHAGEAL PHASE 04/01/2020 Cervical Esophageal Phase WFL  Pudding Teaspoon -- Pudding Cup -- Honey Teaspoon -- Honey Cup -- Nectar Teaspoon -- Nectar Cup -- Nectar Straw -- Thin Teaspoon -- Thin Cup -- Thin Straw -- Puree -- Mechanical Soft -- Regular -- Multi-consistency -- Pill -- Cervical Esophageal Comment -- Sonia Baller, MA, CCC-SLP Speech Therapy              Labs:  CBC: Recent Labs    04/02/20 0436 04/04/20 0516 04/11/20 0617 04/18/20 0803  WBC 12.5* 10.8* 11.2* 13.1*  HGB 11.9* 11.2* 11.3* 11.8*  HCT 37.1 35.0* 34.9* 36.8  PLT 389 358 280 382    COAGS: Recent Labs    03/08/20 1151  INR 1.1  APTT 25    BMP: Recent Labs    03/17/20 0426 03/17/20 0426 03/19/20 0156 03/19/20 0156 03/21/20 0215 03/21/20 0215 03/22/20 0123 03/23/20 0327 04/02/20 0436 04/04/20 0516 04/11/20 0617 04/18/20 0803  NA 143   < > 138   < > 135   < > 131*   < > 138 139 138 139  K 3.8   < > 4.6   < > 5.3*   < > 4.5   < > 4.5 4.4 4.3 4.5  CL 105   < > 102   < > 100   < > 97*   < > 101 102 103 101  CO2 26   < > 25   < > 22   < > 19*   < > 21* 26 24 24   GLUCOSE 194*   < > 268*   < > 275*   < > 306*   < > 141* 140* 157* 153*  BUN 22*   < > 24*   < > 24*   < > 25*   < > 31* 29* 24* 26*  CALCIUM 9.0   < > 9.2   < > 9.3   < > 9.0   < > 9.4 9.4 9.4 9.6  CREATININE 0.81   < > 0.88   < > 0.87   < > 0.78   < > 0.91 0.83 0.78 0.92  GFRNONAA >60   < > >60   < > >60   < > >60   < > >60 >60 >60 >60  GFRAA >60  --  >60  --  >60  --  >60  --   --   --   --   --    < > = values in this interval not displayed.    LIVER FUNCTION TESTS: Recent Labs    03/08/20 1151 03/31/20 0544 04/01/20 0449  BILITOT 0.4 0.6 0.7  AST 24 27 32  ALT 30 30 30   ALKPHOS 169* 96 94  PROT 7.1 6.6 7.0  ALBUMIN 3.4* 2.8* 3.1*    TUMOR MARKERS: No results for input(s): AFPTM, CEA, CA199, CHROMGRNA in the last 8760 hours.  Assessment and Plan:  CVA Dysphagia Need for long term care Scheduled for percutaneous gastric tube placement in IR Off Plavix 5 days Risks and  benefits image guided gastrostomy tube placement was discussed with the patient's son Deborah [name] via phone including, but not limited to the need for a barium enema during the procedure, bleeding, infection, peritonitis and/or damage to adjacent structures.  All questions were answered, patient is agreeable to proceed. Consent signed and in chart.   Thank you for this interesting consult.  I greatly enjoyed meeting Deborah [name] and look forward to participating in their care.  A copy of this report was sent to the requesting provider on this date.  Electronically Signed: Lavonia Drafts, PA-C 04/23/2020, 1:17 PM   I spent a total of 40 Minutes    in face to face in clinical consultation, greater than 50% of which was counseling/coordinating care for percutaneous gastric tube placement

## 2020-04-24 ENCOUNTER — Inpatient Hospital Stay (HOSPITAL_COMMUNITY): Payer: Medicaid Other

## 2020-04-24 ENCOUNTER — Inpatient Hospital Stay (HOSPITAL_COMMUNITY): Payer: Medicaid Other | Admitting: Speech Pathology

## 2020-04-24 LAB — GLUCOSE, CAPILLARY
Glucose-Capillary: 134 mg/dL — ABNORMAL HIGH (ref 70–99)
Glucose-Capillary: 161 mg/dL — ABNORMAL HIGH (ref 70–99)
Glucose-Capillary: 172 mg/dL — ABNORMAL HIGH (ref 70–99)
Glucose-Capillary: 173 mg/dL — ABNORMAL HIGH (ref 70–99)
Glucose-Capillary: 180 mg/dL — ABNORMAL HIGH (ref 70–99)
Glucose-Capillary: 75 mg/dL (ref 70–99)

## 2020-04-24 MED ORDER — SENNOSIDES-DOCUSATE SODIUM 8.6-50 MG PO TABS
1.0000 | ORAL_TABLET | Freq: Every day | ORAL | Status: DC
  Administered 2020-04-24 – 2020-04-28 (×5): 1
  Filled 2020-04-24 (×5): qty 1

## 2020-04-24 MED ORDER — ENOXAPARIN SODIUM 40 MG/0.4ML ~~LOC~~ SOLN
40.0000 mg | SUBCUTANEOUS | Status: DC
  Administered 2020-04-26 – 2020-04-28 (×3): 40 mg via SUBCUTANEOUS
  Filled 2020-04-24 (×3): qty 0.4

## 2020-04-24 NOTE — Progress Notes (Signed)
Speech Language Pathology Daily Session Note  Patient Details  Name: Deborah [name] MRN: [id number] Date of Birth: Jan 31, 1971  Today's Date: 04/24/2020 SLP Individual Time: 1130-1150 SLP Individual Time Calculation (min): 20 min  Short Term Goals: Week 4: SLP Short Term Goal 1 (Week 4): STG's=LTG's (anticipated discharge 11/10)  Skilled Therapeutic Interventions:  Pt was seen for skilled ST targeting goals for communication and swallowing.  SLP facilitated the session with a functional snack of dys 2 textures and thin liquids to monitor toleration of recent diet advancement.  No overt s/s of aspiration were evident with solids or liquids and pt was able to clear solids from the oral cavity post swallow with extra time.  Recommend that pt remain on her currently prescribed diet.  Appreciate MD's note that pt is scheduled for PEG placement tomorrow and orders noted that pt will be NPO after midnight 11/8.  Pt was able to repeat the names of familiar objects from around her room with mod verbal cues to recognize and correct errors.  She was able to name one object with only a sentence completion cue.  Her spontaneous verbalizations are jargon but SLP noted 1-2 instances where approximations of real word productions were attempted (ie when SLP asked when pt was discharging pt stated "wen" and she is in fact leaving on a Wednesday, although this could also be echolalia from therapist's posed question).  Pt was left in chair with chair alarm set and call bell within reach.  Continue per current plan of care.    Pain Pain Assessment Pain Scale: 0-10 Pain Score: 0-No pain Faces Pain Scale: No hurt  Therapy/Group: Individual Therapy  Jonaven Hilgers, Melanee Spry 04/24/2020, 12:35 PM

## 2020-04-24 NOTE — Progress Notes (Signed)
[place] PHYSICAL MEDICINE & REHABILITATION PROGRESS NOTE   Subjective/Complaints: Patient and nursing staff report no concerns. Last BM was 11/5- changed senna HS to scheduled.  Appreciate radiology note, patient has been scheduled for PEG and procedure has been discussed with patient's son   ROS: limited due to aphasia   Objective:   No results found. No results for input(s): WBC, HGB, HCT, PLT in the last 72 hours. No results for input(s): NA, K, CL, CO2, GLUCOSE, BUN, CREATININE, CALCIUM in the last 72 hours.  Intake/Output Summary (Last 24 hours) at 04/24/2020 1026 Last data filed at 04/24/2020 0843 Gross per 24 hour  Intake 875 ml  Output --  Net 875 ml        Physical Exam: Vital Signs Blood pressure (!) 117/57, pulse 76, temperature 97.8 F (36.6 C), resp. rate 16, height 5' (1.524 m), weight 88.5 kg, SpO2 96 %. General: Alert and oriented x 3, No apparent distress HEENT: Head is normocephalic, atraumatic, PERRLA, EOMI, sclera anicteric, oral mucosa pink and moist, dentition intact, ext ear canals clear,  Neck: Supple without JVD or lymphadenopathy Heart: Reg rate and rhythm. No murmurs rubs or gallops Chest: CTA bilaterally without wheezes, rales, or rhonchi; no distress Abdomen: Soft, non-tender, non-distended, bowel sounds positive. Psych: more garbled this AM- couldn't understand more than 1-2 words Musc: No edema in extremities.  No tenderness in extremities. Neuro: Awakens easily. Severe expressive aphasia. Follows simple commands- kept perseverating on "yes" this AM Motor: Limited due to participation, but spontaneously moving left side, no movement noted on right side, no R side movement- moving L side well No abnl tone   Assessment/Plan: 1. Functional deficits secondary to L MCA stroke with aphasia, and R heimplegia  which require 3+ hours per day of interdisciplinary therapy in a comprehensive inpatient rehab setting.  Physiatrist is providing close  team supervision and 24 hour management of active medical problems listed below.  Physiatrist and rehab team continue to assess barriers to discharge/monitor patient progress toward functional and medical goals  Care Tool:  Bathing    Body parts bathed by patient: Chest, Abdomen, Right upper leg, Left upper leg, Face, Front perineal area, Right lower leg, Left lower leg, Right arm, Buttocks   Body parts bathed by helper: Left arm, Buttocks     Bathing assist Assist Level: Moderate Assistance - Patient 50 - 74% (Min-Mod)     Upper Body Dressing/Undressing Upper body dressing   What is the patient wearing?: Pull over shirt    Upper body assist Assist Level: Moderate Assistance - Patient 50 - 74%    Lower Body Dressing/Undressing Lower body dressing      What is the patient wearing?: Pants, Underwear/pull up     Lower body assist Assist for lower body dressing: Maximal Assistance - Patient 25 - 49%     Toileting Toileting Toileting Activity did not occur (Clothing management and hygiene only): N/A (no void or bm)  Toileting assist Assist for toileting: Maximal Assistance - Patient 25 - 49%     Transfers Chair/bed transfer  Transfers assist     Chair/bed transfer assist level: Moderate Assistance - Patient 50 - 74%     Locomotion Ambulation   Ambulation assist   Ambulation activity did not occur: Safety/medical concerns (due to patient fatigue/weakness)  Assist level: 2 helpers Assistive device: Walker-platform (& walking sling on maxisky) Max distance: 4   Walk 10 feet activity   Assist  Walk 10 feet activity did not occur:  Safety/medical concerns (due to patient fatigue/weakness)  Assist level: 2 helpers Assistive device: Lite Gait   Walk 50 feet activity   Assist Walk 50 feet with 2 turns activity did not occur: Safety/medical concerns (due to patient fatigue/weakness)         Walk 150 feet activity   Assist Walk 150 feet activity did not  occur: Safety/medical concerns (due to patient fatigue/weakness)         Walk 10 feet on uneven surface  activity   Assist Walk 10 feet on uneven surfaces activity did not occur: Safety/medical concerns (due to patient fatigue/weakness)         Wheelchair     Assist Will patient use wheelchair at discharge?: Yes Type of Wheelchair: Manual    Wheelchair assist level: Minimal Assistance - Patient > 75% Max wheelchair distance: 15    Wheelchair 50 feet with 2 turns activity    Assist        Assist Level: Minimal Assistance - Patient > 75%   Wheelchair 150 feet activity     Assist      Assist Level: Dependent - Patient 0%   Blood pressure (!) 117/57, pulse 76, temperature 97.8 F (36.6 C), resp. rate 16, height 5' (1.524 m), weight 88.5 kg, SpO2 96 %.  Medical Problem List and Plan: 1.  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  2.  Antithrombotics: -DVT/anticoagulation: Lovenox             -antiplatelet therapy: Plavix 75 mg daily 3. Pain Management: Tylenol as needed.   10/25- controlled- con't regimen  10/26- taped R shoulder to help with pain- will see how does- by OT  11/7: pain appears to be well controlled 4. Mood: Provide emotional support             -antipsychotic agents: N/A 5. Neuropsych: This patient is not capable of making decisions on her own behalf. 6. Skin/Wound Care: Routine skin checks 7. Fluids/Electrolytes/Nutrition: Routine in and outs. 8.  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  Currently with a #4 cuffless trach as of 03/28/2020. Continue PMV as tolerated. Follow-up speech therapy. Tolerating room air.  9.  Post-stroke Dysphagia.    D1 nectars initially- now D1 thins  Continue  tube feeds  Freewater flushes increased to 250 mL every 3 hours, has normal echo  10/25- BUN 24- slightly elevated-   10/28- D1 thins now-   10/30-31 eating around 25-50% meals   -continue TF  11/2- con't TFs- still a little dry- esp needs water- will call family about possible PEG< since cannot go home with NGT.   11/3- called family- got con's permission to go ahead with PEG- placed IR consult (PA)  11/4- waiting to get PEG_ CT of abd/pelvis done  11/5- IT waiting for time off Plavix to do PEG- likely Monday- will monitor  11/7: Appreciate radiology discussion of PEG with patient and son- patient has been scheduled (date not noted) 10. Uncontrolled diabetes mellitus with hyperglycemia.  Hemoglobin A1c 10.7.  NovoLog 10 units every 4 hours, Lantus insulin 24 units twice daily.   CBG (last 3)  Recent Labs    04/23/20 2357 04/24/20 0430 04/24/20 0754  GLUCAP 129* 180* 173*   Slightly elevated on 10/24, likely to  be component as well  11/4- BGs stable -con't regimen  11/5- BGs look better- changed to TID Novolog- Lantus BID- con't regimen  11/6: CBG elevated to 217 this morning. Increased Lantus to 26U.  11.  Urinary retention.  Urecholine 10 mg 3 times daily.    Flomax started on 10/23            PVRs previously with retention 12.  Hyperlipidemia. Lipitor 13.  Tobacco abuse. Counseled on appropriate 14. Leukocytosis             WBCs 10.8 on 10/18, labs ordered for tomorrow  10/25- WBC 11.2- will check U/A and Cx to make sure doesn't have UTI  10/26- no UTI per U/A results  10/28- per son and pt when asked, "she's always had a higher WBC"- might need outpt w/u but nothing to worry about for ID-wise, here.  15. Acute blood loss anemia:   Hgb 11.2 on 10/18, labs ordered for tomorrow 16. Constipation:   Improved. Had BM 11/5  11/7: No BM yesterday. Scheduled senna HS 17. Hypotension:   11/4- BP controlled- con't regimen 18. Trach for previous resp failure  10/30-31--no issues,  capped   -dc tomorrow potentially per primary team  11/1- had lower sats 90-92%- might have been mucus plug that resp got out? CXR shows nothing specific- MAYBE small L pleural effusion.  No fever, no increase in WBC above baseline. Will NOT take out trach, like planned and will put cap back on and monitor  11/2- wait to remove trach- sats 93% RA- but used to be 96% and above.  11/3- sats 96% and above- no suctioning yesterday= if OK, will remove trach tomorrow, if still doing OK.  11/4- will see if can decannulate today- call resp.  11/5- decannulated PEG    19. Dispo  11/2- will call family about PEG- because not eating well.   11/3- d/w family- will get PEG.    LOS: 25 days A FACE TO FACE EVALUATION WAS PERFORMED  Deborah [name] Deborah [name] 04/24/2020, 10:26 AM

## 2020-04-24 NOTE — Progress Notes (Signed)
Physical Therapy Session Note  Patient Details  Name: Deborah [name] MRN: [id number] Date of Birth: 12-08-1970  Today's Date: 04/24/2020 PT Individual Time: 0922-0949 PT Individual Time Calculation: 27 min  Short Term Goals: Week 4:  PT Short Term Goal 1 (Week 4): STG = LTG due to ELOS  Skilled Therapeutic Interventions/Progress Updates:     Patient in bed with NT in the room preparing to perform peri-care due to incontinence upon PT arrival. Patient alert and agreeable to PT session. Patient reported indicated head and R lower extremity pain during session, RN made aware. PT provided repositioning, rest breaks, and distraction as pain interventions throughout session.   Therapeutic Activity: Bed Mobility: Patient performed rolling R/L with supervision for cuing to the R and min A to the L x2 to perform peri-care and lower body dressing with total A. She performed supine to sit with min A for R leg managment. Performed supine to long sit spontaneously and provided verbal cues for walking her lower extremities off the bed. She sat EOB with supervision as PT donned B socks and tennis shoes with total a for energy conservation and time management.  Transfers: Patient performed squat pivot bed>w/c with mod A. Provided verbal cues for hand placement and sequencing.  Wheelchair Mobility:  Patient propelled wheelchair 18 feet with min A-supervision using L upper/lower extremity hemi technique. Place pillow behind patient for increased hamstring activation. Provided verbal cues for technique and coordination. Patient with poor frustration tolerance with activity and refused to continue after a short distance.  Patient with limited communication due to expressive aphasia and flat affect throughout session.  Patient in w/c with R arm elevated on lap tray at end of session with breaks locked, seat belt alarm set, and all needs within reach.    Therapy Documentation Precautions:   Precautions Precautions: Fall Precaution Comments: NG, dense R Hemi, trach, aphasic Restrictions Weight Bearing Restrictions: No   Therapy/Group: Individual Therapy  Olita Takeshita L Shantana Christon PT, DPT  04/24/2020, 6:20 AM

## 2020-04-25 ENCOUNTER — Inpatient Hospital Stay (HOSPITAL_COMMUNITY): Payer: Medicaid Other | Admitting: Occupational Therapy

## 2020-04-25 ENCOUNTER — Inpatient Hospital Stay (HOSPITAL_COMMUNITY): Payer: Medicaid Other

## 2020-04-25 HISTORY — PX: IR GASTROSTOMY TUBE MOD SED: IMG625

## 2020-04-25 LAB — CBC WITH DIFFERENTIAL/PLATELET
Abs Immature Granulocytes: 0.07 10*3/uL (ref 0.00–0.07)
Basophils Absolute: 0.1 10*3/uL (ref 0.0–0.1)
Basophils Relative: 1 %
Eosinophils Absolute: 0.3 10*3/uL (ref 0.0–0.5)
Eosinophils Relative: 3 %
HCT: 38 % (ref 36.0–46.0)
Hemoglobin: 12.4 g/dL (ref 12.0–15.0)
Immature Granulocytes: 1 %
Lymphocytes Relative: 23 %
Lymphs Abs: 2.6 10*3/uL (ref 0.7–4.0)
MCH: 31.5 pg (ref 26.0–34.0)
MCHC: 32.6 g/dL (ref 30.0–36.0)
MCV: 96.4 fL (ref 80.0–100.0)
Monocytes Absolute: 0.8 10*3/uL (ref 0.1–1.0)
Monocytes Relative: 7 %
Neutro Abs: 7.5 10*3/uL (ref 1.7–7.7)
Neutrophils Relative %: 65 %
Platelets: 395 10*3/uL (ref 150–400)
RBC: 3.94 MIL/uL (ref 3.87–5.11)
RDW: 14.1 % (ref 11.5–15.5)
WBC: 11.4 10*3/uL — ABNORMAL HIGH (ref 4.0–10.5)
nRBC: 0 % (ref 0.0–0.2)

## 2020-04-25 LAB — GLUCOSE, CAPILLARY
Glucose-Capillary: 149 mg/dL — ABNORMAL HIGH (ref 70–99)
Glucose-Capillary: 159 mg/dL — ABNORMAL HIGH (ref 70–99)
Glucose-Capillary: 165 mg/dL — ABNORMAL HIGH (ref 70–99)
Glucose-Capillary: 170 mg/dL — ABNORMAL HIGH (ref 70–99)
Glucose-Capillary: 172 mg/dL — ABNORMAL HIGH (ref 70–99)
Glucose-Capillary: 230 mg/dL — ABNORMAL HIGH (ref 70–99)

## 2020-04-25 LAB — BASIC METABOLIC PANEL
Anion gap: 14 (ref 5–15)
BUN: 21 mg/dL — ABNORMAL HIGH (ref 6–20)
CO2: 23 mmol/L (ref 22–32)
Calcium: 9.4 mg/dL (ref 8.9–10.3)
Chloride: 100 mmol/L (ref 98–111)
Creatinine, Ser: 0.91 mg/dL (ref 0.44–1.00)
GFR, Estimated: >60 mL/min (ref >60)
Glucose, Bld: 166 mg/dL — ABNORMAL HIGH (ref 70–99)
Potassium: 4.4 mmol/L (ref 3.5–5.1)
Sodium: 137 mmol/L (ref 135–145)

## 2020-04-25 MED ORDER — LIDOCAINE HCL 1 % IJ SOLN
INTRAMUSCULAR | Status: AC
  Filled 2020-04-25: qty 20

## 2020-04-25 MED ORDER — LIDOCAINE HCL (PF) 1 % IJ SOLN
INTRAMUSCULAR | Status: AC | PRN
  Administered 2020-04-25: 10 mL

## 2020-04-25 MED ORDER — GLUCAGON HCL (RDNA) 1 MG IJ SOLR
INTRAMUSCULAR | Status: AC | PRN
  Administered 2020-04-25: .5 mg via INTRAVENOUS

## 2020-04-25 MED ORDER — CEFAZOLIN SODIUM-DEXTROSE 2-4 GM/100ML-% IV SOLN
INTRAVENOUS | Status: AC
  Administered 2020-04-25: 2 g via INTRAVENOUS
  Filled 2020-04-25: qty 100

## 2020-04-25 MED ORDER — FENTANYL CITRATE (PF) 100 MCG/2ML IJ SOLN
INTRAMUSCULAR | Status: AC | PRN
Start: 2020-04-25 — End: 2020-04-25
  Administered 2020-04-25 (×2): 25 ug via INTRAVENOUS

## 2020-04-25 MED ORDER — GLUCAGON HCL RDNA (DIAGNOSTIC) 1 MG IJ SOLR
INTRAMUSCULAR | Status: AC
  Filled 2020-04-25: qty 1

## 2020-04-25 MED ORDER — IOHEXOL 300 MG/ML  SOLN
50.0000 mL | Freq: Once | INTRAMUSCULAR | Status: AC | PRN
  Administered 2020-04-25: 10 mL

## 2020-04-25 MED ORDER — FENTANYL CITRATE (PF) 100 MCG/2ML IJ SOLN
INTRAMUSCULAR | Status: AC
  Filled 2020-04-25: qty 2

## 2020-04-25 MED ORDER — MIDAZOLAM HCL 2 MG/2ML IJ SOLN
INTRAMUSCULAR | Status: AC | PRN
  Administered 2020-04-25: 1 mg via INTRAVENOUS
  Administered 2020-04-25: 0.5 mg via INTRAVENOUS

## 2020-04-25 MED ORDER — MIDAZOLAM HCL 2 MG/2ML IJ SOLN
INTRAMUSCULAR | Status: AC
  Filled 2020-04-25: qty 2

## 2020-04-25 NOTE — Progress Notes (Signed)
Speech Language Pathology Daily Session Note  Patient Details  Name: Deborah [name] MRN: [id number] Date of Birth: March 25, 1971  Today's Date: 04/25/2020 SLP Individual Time: 0804-0900 SLP Individual Time Calculation (min): 56 min  Short Term Goals: Week 4: SLP Short Term Goal 1 (Week 4): STG's=LTG's (anticipated discharge 11/10)  Skilled Therapeutic Interventions:Skilled ST services focused on language skills. SLP facilitated response to yes/no questions pertaining to wants/needs throughout session 70% accuracy and in naming common objects 82% accuracy (out of 11 trials) utilizing yes/no picture board. Pt continues to only verbalize "yes" or another related/unrelated word, but does not verbalize "no."  Pt demonstrated ability to name 2 objects independently, repeat 90% of object names with max A verbal cues to error phonemic errors and name 4 objects out of 10 opportunities with mod A sentence completion and phonemic cues. Pt was able to identify requested picture on 6 cell communication board with 56% accuracy, after matching pictures pt increased accuracy pointing to named picture with 67% accuracy. SLP also facilitated verbalization of CV and CVC initial /b/ words with picture cards. Pt was able to produce 4 out 5 CV words with sentence completion cues and 3 out of 7 CVC words with sentence completion cues and in repetition. Pt was left in room with call bell within reach and bed alarm set. SLP recommends to continue skilled services.     Pain Pain Assessment Pain Score: 0-No pain  Therapy/Group: Individual Therapy  Rennae Ferraiolo  [facility] 04/25/2020, 9:07 AM

## 2020-04-25 NOTE — Progress Notes (Signed)
Occupational Therapy Session Note  Patient Details  Name: Deborah [name] MRN: [id number] Date of Birth: 1971-02-25  Today's Date: 04/25/2020 OT Missed Time: 60 Minutes Missed Time Reason: Unavailable (comment) (PEG placement, off unit)   Short Term Goals: Week 3:  OT Short Term Goal 1 (Week 3): Pt will don pants with Mod A with sit to stand to don over hips OT Short Term Goal 1 - Progress (Week 3): Progressing toward goal OT Short Term Goal 2 (Week 3): Pt will perform sit to stand consistently with Min A of 1 in prep for LB ADL OT Short Term Goal 2 - Progress (Week 3): Progressing toward goal OT Short Term Goal 3 (Week 3): Pt will perform don shirt with hemidressing technique no more than Min A OT Short Term Goal 3 - Progress (Week 3): Progressing toward goal  Week 4: STGs = LTGs d/t ELOS  Skilled Therapeutic Interventions/Progress Updates:    Pt off unit getting PEG tube placement, missed 60 minutes of OT. Will continue to follow.   Therapy Documentation Precautions:  Precautions Precautions: Fall Precaution Comments: NG, dense R Hemi, trach, aphasic Restrictions Weight Bearing Restrictions: No    Therapy/Group: Individual Therapy  Erasmo Score 04/25/2020, 4:32 PM

## 2020-04-25 NOTE — Procedures (Signed)
Interventional Radiology Procedure Note  Procedure: Gastrostomy tube placement  Complications: None  Estimated Blood Loss: < 10 mL  Findings: 20 Fr bumper retention gastrostomy tube placed with tip in body of stomach. OK to use in 24 hours.  Jad Johansson T. Armari Fussell, M.D Pager:  [phone]   

## 2020-04-25 NOTE — Progress Notes (Signed)
Pt slept well most of the night. No complaints noted. NPO/TF stopped since midnight.

## 2020-04-25 NOTE — Progress Notes (Signed)
Nutrition Follow-up  DOCUMENTATION CODES:   Obesity unspecified  INTERVENTION:   - RD will monitor for ability to transition pt to bolus tube feeding regimen once PEG cleared for use  Once PEG tube cleared for use, resume nocturnal tube feeding: - Continue Glucerna 1.5@ 65 ml/hrto run from 1800 to 0600 (total of 780 ml) - Continue ProSource TF 45 ml daily -Continuefree water flushes of230ml q 3hours while awake (order per MD)  Nocturnal tube feeding regimen provides1210 kcal,75grams of protein, and556ml of H2O(67% of kcal needs, 79% of protein needs).  Total free water with flushes:2037ml  - Continue MVI with minerals daily  -Continue Magic CupBID with meals, each supplement provides 290 kcal and 9 grams of protein  - Continue Ensure Max po TID, each supplement provides 150 kcal and 30 grams of protein  NUTRITION DIAGNOSIS:   Inadequate oral intake related to dysphagia as evidenced by NPO status.  Progressing  GOAL:   Patient will meet greater than or equal to 90% of their needs  Progressing  MONITOR:   PO intake, Diet advancement, Labs, Weight trends, TF tolerance, Skin, I & O's  REASON FOR ASSESSMENT:   Consult Enteral/tube feeding initiation and management  ASSESSMENT:   49 year old female with PMH of DM, HLD, and tobacco abuse. Presented on 03/08/20 with right-sided hemiparesis and aphasia. Pt was found to have left MCA CVA. Pt underwent thrombectomy per IR. Pt required intubation and underwent tracheostomy on 03/13/20. Pt is currently NPO with Cortrak in place. Admitted to CIR on 10/13.  10/15 - diet advanced to dysphagia 1 with nectar-thick liquids 10/25 - upgraded to thin liquids 11/04 - decannulated 11/05 - diet advanced to dysphagia 2 with thin liquids  Pt's target d/c date is 11/10. Pt is scheduled to have PEG placed today and is currently NPO. Cortrak remains in place and is currently clamped.  Pt currently on nocturnal tube  feeds. Will monitor for ability to transition to bolus tube feeds.  Current weight: 88.4 kg Admit weight: 90.1 kg  Meal Completion: 30-100%  Medications reviewed and include: colace, SSI q 4 hours, Novolog 10 units TID after meals. Lantus 26 units BID, MVI with minerals daily, Ensure Max TID, senna, IV abx  Labs reviewed. CBG's: 75-172 x 24 hours  Diet Order:   Diet Order            Diet NPO time specified  Diet effective midnight                 EDUCATION NEEDS:   No education needs have been identified at this time  Skin:  Skin Assessment: Reviewed RN Assessment  Last BM:  04/25/20  Height:   Ht Readings from Last 1 Encounters:  03/31/20 5' (1.524 m)    Weight:   Wt Readings from Last 1 Encounters:  04/25/20 88.4 kg    BMI:  Body mass index is 38.06 kg/m.  Estimated Nutritional Needs:   Kcal:  1800-2000  Protein:  95-110 grams  Fluid:  >/= 1.8 L    Earma Reading, MS, RD, LDN Inpatient Clinical Dietitian Please see AMi[facility] for contact information.

## 2020-04-25 NOTE — Progress Notes (Signed)
Physical Therapy Session Note  Patient Details  Name: Deborah [name] MRN: [id number] Date of Birth: 1970-06-19  Today's Date: 04/25/2020 PT Individual Time: 0930-1030 PT Individual Time Calculation (min): 60 min   Short Term Goals: Week 4:  PT Short Term Goal 1 (Week 4): STG = LTG due to ELOS  Skilled Therapeutic Interventions/Progress Updates:    Patient in supine and seems nervous about upcoming procedure for PEG later today.  RN arrived and requesting pt stay in her gown due to not sure when they will call to take her down for procedure.  Patient with brief and bed soiled with urine.  Assisted to EOB min A for R LE.  Donned shoes and R aircast seated EOB.  Sit to stand min A to doff soiled brief.  Stand pivot to w/c mod A.  Assisted to bathroom in w/c and pivot to toilet using grabbar with mod A.  Patient continent of BM while on toilet.  Sit to stand with grabbar and min A; mod A for balance while total A for hygiene mod cues for pt to keep holding grabbar.  Patient assisted x 3 in standing to complete hygiene and don brief due to c/o R LE pain in standing with seated rest each time and stretching of hamstrings.  Patient transferred to w/c mod A and assisted out of bathroom to sink.  A to comb hair and then transfer practice bed<>w/c x 4 with min to mod A each time and cues for sequence.  Patient propelled w/c in room with close S to min A for using L foot to steer.  Back in supine for passive stretching R LE hamstrings, hip extensors and adductors and ankle plantarflexors, 2 x 20 sec each.  Left in supine with call bell in reach and bed alarm active.   Therapy Documentation Precautions:  Precautions Precautions: Fall Precaution Comments: NG, dense R Hemi, trach, aphasic Restrictions Weight Bearing Restrictions: No Pain: Pain Assessment Pain Scale: Faces Pain Score: 0-No pain Faces Pain Scale: Hurts even more Pain Type: Acute pain Pain Location: Leg Pain Orientation: Right Pain  Descriptors / Indicators: Crying;Guarding Pain Onset: With Activity Pain Intervention(s): Repositioned;Emotional support;Rest;Other (Comment) (stretching)    Therapy/Group: Individual Therapy  [name]  [place], PT 04/25/2020, 12:12 PM

## 2020-04-25 NOTE — Discharge Summary (Signed)
Physician Discharge Summary  Patient ID: Deborah [name] MRN: [id number] DOB/AGE: [birth year] 49 y.o.  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 EXAM: CT ABDOMEN WITHOUT CONTRAST TECHNIQUE: Multidetector CT imaging of the abdomen was performed following the standard protocol without IV contrast. COMPARISON:  None. FINDINGS: The lack of intravenous contrast limits the ability to evaluate solid abdominal organs. Lower chest: Limited visualization of the lower thorax demonstrates minimal dependent subpleural ground-glass atelectasis, right greater than left. No discrete focal airspace opacities. No pleural effusion Normal heart size. There is diffuse decreased attenuation of the intra cardiac blood pool suggestive of anemia. No pericardial effusion. Hepatobiliary: Normal hepatic contour. Normal noncontrast appearance of the gallbladder given degree distention. No radiopaque gallstones. No ascites. Pancreas: Normal noncontrast appearance of the pancreas. Spleen: Normal noncontrast appearance spleen. Note is made of a small splenule about the splenic hilum. 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 No evidence of right-sided nephrolithiasis or urinary  obstruction. Normal noncontrast appearance the bilateral adrenal glands. The urinary bladder was not imaged. Stomach/Bowel: The anterior wall of the stomach is well apposed against the ventral wall of the upper abdomen without interposition of the hepatic parenchyma or the transverse colon and the percutaneous window will likely be improved with gastric insufflation. Enteric tube tip terminates within the gastric antrum. Large colonic stool burden without evidence of enteric obstruction. No pneumoperitoneum, pneumatosis or portal venous gas. Vascular/Lymphatic: Moderate amount calcified atherosclerotic plaque within normal caliber abdominal aorta. No bulky retroperitoneal or mesenteric lymphadenopathy on this noncontrast examination. Other: Ill-defined subcutaneous stranding and scattered foci of subcutaneous emphysema involving the ventral aspect of the abdominal pannus likely the location of subcutaneous medication administration. Note is made of 2 adjacent narrow neck mesenteric fat containing midline ventral wall hernias (representative sagittal image 63, series 7). There is a minimal amount of subcutaneous edema about the midline of the low back. Musculoskeletal: No acute or aggressive osseous abnormalities. IMPRESSION: 1. Gastric anatomy amenable to potential percutaneous gastrostomy tube placement as indicated. 2. Large colonic stool burden without evidence of enteric obstruction. 3. 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 No evidence of right-sided urinary obstruction. 4. Aortic Atherosclerosis (ICD10-I70.0). Electronically Signed   By: Sandi Mariscal M.D.   On: 04/20/2020 14:01   IR GASTROSTOMY TUBE MOD SED  Result Date: 04/25/2020 CLINICAL DATA:  Cerebral infarction, dysphagia and need for percutaneous gastrostomy tube. EXAM: PERCUTANEOUS GASTROSTOMY TUBE PLACEMENT ANESTHESIA/SEDATION: 1.5 mg IV Versed; 50 mcg IV Fentanyl. Total Moderate Sedation Time 21  minutes. The patient's level of consciousness and physiologic status were continuously monitored during the procedure by Radiology nursing. CONTRAST:  47m OMNIPAQUE IOHEXOL 300 MG/ML  SOLN MEDICATIONS: 2 g IV Ancef. IV antibiotic was administered in an appropriate time interval prior to needle puncture of the skin. FLUOROSCOPY TIME:  1 minutes and 42 seconds.  24.0 mGy. PROCEDURE: The procedure, risks, benefits, and alternatives were explained to the  patient's son. Questions regarding the procedure were encouraged and answered. The patient's son understands and consents to the procedure. A time-out was performed prior to initiating the procedure. A 5-French catheter was then advanced through the patient's mouth under fluoroscopy into the esophagus and to the level of the stomach. This catheter was used to insufflate the stomach with air under fluoroscopy. The abdominal wall was prepped with Betadine in a sterile fashion, and a sterile drape was applied covering the operative field. A sterile gown and sterile gloves were used for the procedure. Local anesthesia was provided with 1% Lidocaine. A skin incision was made in the upper abdominal wall. Under fluoroscopy, an 18 gauge trocar needle was advanced into the stomach. Contrast injection was performed to confirm intraluminal position of the needle tip. A single T tack was then deployed in the lumen of the stomach. This was brought up to tension at the skin surface. Over a guidewire, a 9-French sheath was advanced into the lumen of the stomach. The wire was left in place as a safety wire. A loop snare device from a percutaneous gastrostomy kit was then advanced into the stomach. A floppy guide wire was advanced through the orogastric catheter under fluoroscopy in the stomach. The loop snare advanced through the percutaneous gastric access was used to snare the guide wire. This allowed withdrawal of the loop snare out of the patient's mouth by retraction of the  orogastric catheter and wire. A 20-French bumper retention gastrostomy tube was looped around the snare device. It was then pulled back through the patient's mouth. The retention bumper was brought up to the anterior gastric wall. The T tack suture was cut at the skin. The exiting gastrostomy tube was cut to appropriate length and a feeding adapter applied. The catheter was injected with contrast material to confirm position and a fluoroscopic spot image saved. The tube was then flushed with saline. A dressing was applied over the gastrostomy exit site. COMPLICATIONS: None. FINDINGS: The stomach distended well with air allowing safe placement of the gastrostomy tube. After placement, the tip of the gastrostomy tube lies in the body of the stomach. IMPRESSION: Percutaneous gastrostomy with placement of a 20-French bumper retention tube in the body of the stomach. This tube can be used for percutaneous feeds beginning in 24 hours after placement. Electronically Signed   By: Aletta Edouard M.D.   On: 04/25/2020 17:01   DG CHEST PORT 1 VIEW  Result Date: 04/18/2020 CLINICAL DATA:  Hypoxia EXAM: PORTABLE CHEST 1 VIEW COMPARISON:  03/30/2020 FINDINGS: Tracheostomy in satisfactory position. Mild hazy opacity left lung base may reflect a small left pleural effusion, but this is equivocal. Right lung is clear. No frank interstitial edema. No pneumothorax. The heart is normal in size. Weighted feeding tube terminates in the gastric antrum. IMPRESSION: Possible small left pleural effusion, equivocal. No frank interstitial edema. Enteric tube terminates in the gastric antrum. Tracheostomy in satisfactory position. Electronically Signed   By: Julian Hy M.D.   On: 04/18/2020 09:04   DG Chest Port 1 View  Result Date: 03/30/2020 CLINICAL DATA:  Leukocytosis EXAM: PORTABLE CHEST 1 VIEW COMPARISON:  03/19/2020 FINDINGS: Tracheostomy tube remains in place. Enteric tube courses below the diaphragm with distal tip  beyond the inferior margin of the film. Stable heart size. Slightly low lung volumes. Minimal streaky bibasilar opacity. No pleural effusion or pneumothorax. IMPRESSION: Minimal streaky bibasilar opacity, atelectasis versus infiltrate. Electronically Signed   By: Davina Poke D.O.   On: 03/30/2020  11:03   DG Swallowing Func-Speech Pathology  Result Date: 04/01/2020 Objective Swallowing Evaluation: Type of Study: MBS-Modified Barium Swallow Study  Patient Details Name: Keilani Terrance MRN: [id number] Date of Birth: 1970/08/09 Today's Date: 04/01/2020 Time: SLP Start Time (ACUTE ONLY): 9924 -SLP Stop Time (ACUTE ONLY): 1224 SLP Time Calculation (min) (ACUTE ONLY): 27 min Past Medical History: Past Medical History: Diagnosis Date . DM (diabetes mellitus), type 2 ([place])  . Hyperlipidemia  . Hypersomnia  . Sprain of right ankle, initial encounter  . Stroke ([place])  . Syncope  Past Surgical History: Past Surgical History: Procedure Laterality Date . IR CT HEAD LTD  03/08/2020 . IR PERCUTANEOUS ART THROMBECTOMY/INFUSION INTRACRANIAL INC DIAG ANGIO  03/08/2020 . RADIOLOGY WITH ANESTHESIA N/A 03/08/2020  Procedure: IR WITH ANESTHESIA;  Surgeon: Luanne Bras, MD;  Location: [facility];  Service: Radiology;  Laterality: N/A; HPI: 49 y/o female with history of IDDM, HLD, tobacco use disorder presented to [facility] for facial droop, right sided weakness and global aphasia. Found to have LVO of left M1 segment and was transferred to [facility]. Pt underwent left common carotid arteriogram followed by revascularization of L MCA, however it reoccluded due to sever distal M1 stenosis.Pt underwent trach on 9/26.  Subjective: alert, not verbal Assessment / Plan / Recommendation CHL IP CLINICAL IMPRESSIONS 04/01/2020 Clinical Impression Patient presents with a moderate oropharyngeal dysphagia with oral phase consisting of decreased bolus cohesion, premature spillage of all boluses to level of vallecular sinus and anterior spillage  on right, delayed oral transit. Swallow initiation occured at level of vallecular sinus for puree solids, nectar thick liquids but at level of pyriform sinus for thin liquids. One instance of flash penetration with cup sip of nectar thick liquids and two instances of silent aspiration of trace to min amount with thin liquids during the swallow. No pharyngeal residuals observed post swallows. SLP Visit Diagnosis Dysphagia, oropharyngeal phase (R13.12) Attention and concentration deficit following -- Frontal lobe and executive function deficit following -- Impact on safety and function Mild aspiration risk   CHL IP TREATMENT RECOMMENDATION 03/23/2020 Treatment Recommendations Therapy as outlined in treatment plan below   Prognosis 04/01/2020 Prognosis for Safe Diet Advancement Good Barriers to Reach Goals -- Barriers/Prognosis Comment -- CHL IP DIET RECOMMENDATION 04/01/2020 SLP Diet Recommendations Nectar thick liquid;Dysphagia 1 (Puree) solids Liquid Administration via Cup Medication Administration Crushed with puree Compensations Minimize environmental distractions;Slow rate;Small sips/bites Postural Changes Seated upright at 90 degrees   CHL IP OTHER RECOMMENDATIONS 04/01/2020 Recommended Consults -- Oral Care Recommendations Oral care BID Other Recommendations Have oral suction available;Place PMSV during PO intake   CHL IP FOLLOW UP RECOMMENDATIONS 03/30/2020 Follow up Recommendations Inpatient Rehab   CHL IP FREQUENCY AND DURATION 03/23/2020 Speech Therapy Frequency (ACUTE ONLY) min 2x/week Treatment Duration 2 weeks      CHL IP ORAL PHASE 04/01/2020 Oral Phase Impaired Oral - Pudding Teaspoon -- Oral - Pudding Cup -- Oral - Honey Teaspoon -- Oral - Honey Cup -- Oral - Nectar Teaspoon -- Oral - Nectar Cup Premature spillage Oral - Nectar Straw -- Oral - Thin Teaspoon Right anterior bolus loss;Weak lingual manipulation;Premature spillage Oral - Thin Cup Weak lingual manipulation;Premature spillage;Reduced  posterior propulsion Oral - Thin Straw -- Oral - Puree Weak lingual manipulation;Reduced posterior propulsion;Premature spillage Oral - Mech Soft -- Oral - Regular -- Oral - Multi-Consistency -- Oral - Pill -- Oral Phase - Comment --  CHL IP PHARYNGEAL PHASE 04/01/2020 Pharyngeal Phase Impaired Pharyngeal- Pudding Teaspoon -- Pharyngeal -- Pharyngeal- Pudding  Cup -- Pharyngeal -- Pharyngeal- Honey Teaspoon -- Pharyngeal -- Pharyngeal- Honey Cup -- Pharyngeal -- Pharyngeal- Nectar Teaspoon -- Pharyngeal -- Pharyngeal- Nectar Cup Delayed swallow initiation-vallecula;Penetration/Aspiration during swallow Pharyngeal Material enters airway, remains ABOVE vocal cords then ejected out Pharyngeal- Nectar Straw -- Pharyngeal -- Pharyngeal- Thin Teaspoon Delayed swallow initiation-pyriform sinuses Pharyngeal Material does not enter airway Pharyngeal- Thin Cup Delayed swallow initiation-pyriform sinuses;Reduced airway/laryngeal closure;Penetration/Aspiration during swallow;Reduced epiglottic inversion Pharyngeal Material enters airway, passes BELOW cords without attempt by patient to eject out (silent aspiration) Pharyngeal- Thin Straw -- Pharyngeal -- Pharyngeal- Puree Delayed swallow initiation-vallecula Pharyngeal -- Pharyngeal- Mechanical Soft -- Pharyngeal -- Pharyngeal- Regular -- Pharyngeal -- Pharyngeal- Multi-consistency -- Pharyngeal -- Pharyngeal- Pill -- Pharyngeal -- Pharyngeal Comment --  CHL IP CERVICAL ESOPHAGEAL PHASE 04/01/2020 Cervical Esophageal Phase WFL Pudding Teaspoon -- Pudding Cup -- Honey Teaspoon -- Honey Cup -- Nectar Teaspoon -- Nectar Cup -- Nectar Straw -- Thin Teaspoon -- Thin Cup -- Thin Straw -- Puree -- Mechanical Soft -- Regular -- Multi-consistency -- Pill -- Cervical Esophageal Comment -- Sonia Baller, MA, CCC-SLP Speech Therapy              Labs:  Basic Metabolic Panel: Recent Labs  Lab 04/25/20 0732  NA 137  K 4.4  CL 100  CO2 23  GLUCOSE 166*  BUN 21*  CREATININE 0.91   CALCIUM 9.4    CBC: Recent Labs  Lab 04/25/20 0732  WBC 11.4*  NEUTROABS 7.5  HGB 12.4  HCT 38.0  MCV 96.4  PLT 395    CBG: Recent Labs  Lab 04/28/20 0830 04/28/20 1131 04/28/20 1645 04/28/20 1958 04/28/20 2356  GLUCAP 248* 191* 89 148* 126*   Family history. Mother with heart defect Father with CAD paternal grandfather with CVA. Denies any colon cancer esophageal cancer rectal cancer  Brief HPI:   Jaylise Peek is a 49 y.o. right-handed female with history of uncontrolled diabetes mellitus hyperlipidemia and tobacco abuse. Presented 03/08/2020 with acute right-sided weakness aphasia to an outside hospital. 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 Urine drug screen positive benzos and barbiturates. CTA confirming a left M2 cutoff. She was brought to [facility] underwent thrombectomy per interventional radiology. MRI showed large territory infarct with moderate hemorrhage and mild subarachnoid hemorrhage. Edema with mass-effect 7 mm right side midline shift. Echocardiogram with ejection fraction of 60 to 65% no wall motion abnormalities. EEG negative for seizure. She required intubation for extended period of time underwent tracheostomy 03/13/2020 per Dr. Ina Homes. She was downsized to a #4 cuffless trach 03/28/2020. She currently was n.p.o. with alternative means of nutritional support. Maintained on Plavix for CVA prophylaxis. Subcutaneous Lovenox for DVT prophylaxis. Bouts of urinary retention placed on low-dose Urecholine. Therapy evaluations completed and patient was admitted for a comprehensive rehab program.    Hospital Course: Aime Carreras was admitted to rehab 03/30/2020 for inpatient therapies to consist of PT, ST and OT at least three hours five days a week. Past admission physiatrist, therapy team and rehab RN have worked together to provide customized collaborative inpatient rehab. Pertaining to patient's left MCA infarction status post IR with  postprocedural hemorrhage. She did remain on Plavix therapy. Patient would follow-up neurology as well as interventional radiology. She was maintained on Lovenox for DVT prophylaxis. Mood stabilization with amantadine. Patient with extended hospital course of tracheostomy she was decannulated 04/22/2020. Bouts of urinary retention maintained on low-dose Urecholine  PVRs improving. Diabetes mellitus hemoglobin A1c 10.7 diabetic teaching with  follow-up for diabetic coordinator and insulin therapy as directed she would need follow-up with her primary MD. Lipitor ongoing for hyperlipidemia. Patient with decreased nutritional storage as well as dysphagia she initially had nasogastric tube for nutritional support. She underwent gastrostomy PEG tube placement 04/25/2020 per interventional radiology is to sustain nutrition as well as follow-up speech therapy and continue to work with her dysphagia. Noted obesity BMI 38.06 dietary follow-up    Blood pressures were monitored on TID basis and controlled  Diabetes has been monitored with ac/hs CBG checks and SSI was use prn for tighter BS control.    Rehab course: During patient's stay in rehab weekly team conferences were held to monitor patient's progress, set goals and discuss barriers to discharge. At admission, patient required moderate assist sit to stand total assist sit to supine max assist supine to sit. Total assist grooming max assist functional mobility  Physical exam. Blood pressure 121/75 pulse 79 temperature 97 respirations 18 oxygen saturation 94% room air Constitutional. No acute distress HEENT Head. Normocephalic and atraumatic Eyes. Pupils round and reactive to light no discharge without nystagmus Neck. Supple nontender/trach tube no JVD without thyromegaly Cardiac regular rate rhythm without extra sounds or murmur heard Abdomen obese soft nontender positive bowel sounds Respiratory effort normal no respiratory distress  without  wheeze Neurologic. Globally aphasic makes eye contact with examiner limited to follow commands. Left upper and left lower extremity. Grossly intact with spontaneous movement No spontaneous movement noted right upper right lower extremity. Withdraws to pain stimuli  Marlana Salvage  has had improvement in activity tolerance, balance, postural control as well as ability to compensate for deficits. Marlana Salvage has had improvement in functional use RUE/LUE  and RLE/LLE as well as improvement in awareness. Patient performed rolling right to left with supervision for cues to the right and min assist to the left. Perform supine to long sit spontaneously and provided verbal cues for walking her lower extremities off the bed. Patient propelled wheelchair 18 feet minimal assist supervision using left upper extremity lower extremity hemitechnique. Son continued with family teaching completed one stand pivot transfer to the toilet with patient verbal cues OT provided minimal assist for transfers also for safety. Contact-guard assist for transfers back to wheelchair with son providing at least moderate assist. Speech therapy follow-up in regards to dysphagia as well as global aphasia with ongoing education with family and need for assistance at discharge.  Her discharge was extended until 04/29/2020 to complete family teaching for PEG tube as well as adjustments in nutrition needs.       Disposition: Discharged to home    Diet: Dysphagia #2 thin liquids  Glucerna 180 mL 3 times daily Prosource 45 mL 3 times daily per tube Free water 150 ml 4 times daily per tube  Special Instructions: No driving smoking or alcohol  Medications at discharge 1.. Tylenol as needed 2. Amantadine 200 mg daily 3. Urecholine 10 mg 3 times daily 4. Colace 100 mg twice daily 6. Lantus insulin 28 units twice daily 7. Multivitamin daily 8. Senokot S1 tablet nightly 9.Lipitor 80 mg daily 10.Plavix 75 mg daily 11.Tramadol 50 mg every 6 hour as  needed pain  30-35 minutes were spent completing discharge summary and discharge planning  Discharge Instructions    Ambulatory referral to Neurology   Complete by: As directed    An appointment is requested in approximately 4 weeks left MCA with IR post hemorrhagic conversion   Ambulatory referral to Physical Medicine Rehab   Complete by:  As directed    Moderate complexity follow-up 1 to 2 weeks left MCA infarction with IR post hemorrhagic conversion       Follow-up Information    Lovorn, Jinny Blossom, MD Follow up.   Specialty: Physical Medicine and Rehabilitation Why: Office to call for appointment Contact information: [address] [place] [address] [phone]        Candee Furbish, MD Follow up.   Specialty: Pulmonary Disease Why: Call for appointment Contact information: [place] [address] [phone]               Signed: Lavon Paganini [place] 04/29/2020, 5:24 AM

## 2020-04-25 NOTE — Progress Notes (Signed)
Deborah [name] PHYSICAL MEDICINE & REHABILITATION PROGRESS NOTE   Subjective/Complaints:  Pt hungry and wants to eat- kept asking for food.  Can't feed- scheduled for PEG today- tried to explain- pt didn't understand  ROS: limited due to aphasia   Objective:   No results found. Recent Labs    04/25/20 0732  WBC 11.4*  HGB 12.4  HCT 38.0  PLT 395   Recent Labs    04/25/20 0732  NA 137  K 4.4  CL 100  CO2 23  GLUCOSE 166*  BUN 21*  CREATININE 0.91  CALCIUM 9.4    Intake/Output Summary (Last 24 hours) at 04/25/2020 1647 Last data filed at 04/25/2020 1633 Gross per 24 hour  Intake 190 ml  Output --  Net 190 ml        Physical Exam: Vital Signs Blood pressure 136/65, pulse 80, temperature 98.5 F (36.9 C), temperature source Oral, resp. rate 20, height 5' (1.524 m), weight 88.4 kg, SpO2 96 %. General: pt sitting up in bed- aphasic- said food, SLP at bedside, NAD HEENT: has cortrak in place Neck: Supple without JVD or lymphadenopathy Heart: RRR Chest: CTA B/L- no W/R/R- good air movement Abdomen: Soft, NT, ND, (+)BS  Psych: could understand  "food" Musc: No edema in extremities.  No tenderness in extremities. Neuro: Awakens easily. Severe expressive aphasia. Follows simple commands- kept perseverating on "yes" this AM- still again this AM Motor: Limited due to participation, but spontaneously moving left side, no movement noted on right side, no R side movement- moving L side well No abnl tone   Assessment/Plan: 1. Functional deficits secondary to L MCA stroke with aphasia, and R heimplegia  which require 3+ hours per day of interdisciplinary therapy in a comprehensive inpatient rehab setting.  Physiatrist is providing close team supervision and 24 hour management of active medical problems listed below.  Physiatrist and rehab team continue to assess barriers to discharge/monitor patient progress toward functional and medical goals  Care Tool:  Bathing     Body parts bathed by patient: Chest, Abdomen, Right upper leg, Left upper leg, Face, Front perineal area, Right lower leg, Left lower leg, Right arm, Buttocks   Body parts bathed by helper: Left arm, Buttocks     Bathing assist Assist Level: Moderate Assistance - Patient 50 - 74% (Min-Mod)     Upper Body Dressing/Undressing Upper body dressing   What is the patient wearing?: Pull over shirt    Upper body assist Assist Level: Moderate Assistance - Patient 50 - 74%    Lower Body Dressing/Undressing Lower body dressing      What is the patient wearing?: Pants, Underwear/pull up     Lower body assist Assist for lower body dressing: Maximal Assistance - Patient 25 - 49%     Toileting Toileting Toileting Activity did not occur (Clothing management and hygiene only): N/A (no void or bm)  Toileting assist Assist for toileting: Maximal Assistance - Patient 25 - 49%     Transfers Chair/bed transfer  Transfers assist     Chair/bed transfer assist level: Moderate Assistance - Patient 50 - 74%     Locomotion Ambulation   Ambulation assist   Ambulation activity did not occur: Safety/medical concerns (due to patient fatigue/weakness)  Assist level: 2 helpers Assistive device: Walker-platform (& walking sling on maxisky) Max distance: 4   Walk 10 feet activity   Assist  Walk 10 feet activity did not occur: Safety/medical concerns (due to patient fatigue/weakness)  Assist level: 2  helpers Assistive device: Lite Gait   Walk 50 feet activity   Assist Walk 50 feet with 2 turns activity did not occur: Safety/medical concerns (due to patient fatigue/weakness)         Walk 150 feet activity   Assist Walk 150 feet activity did not occur: Safety/medical concerns (due to patient fatigue/weakness)         Walk 10 feet on uneven surface  activity   Assist Walk 10 feet on uneven surfaces activity did not occur: Safety/medical concerns (due to patient  fatigue/weakness)         Wheelchair     Assist Will patient use wheelchair at discharge?: Yes Type of Wheelchair: Manual    Wheelchair assist level: Minimal Assistance - Patient > 75% Max wheelchair distance: 73'    Wheelchair 50 feet with 2 turns activity    Assist        Assist Level: Minimal Assistance - Patient > 75%   Wheelchair 150 feet activity     Assist      Assist Level: Moderate Assistance - Patient 50 - 74%   Blood pressure 136/65, pulse 80, temperature 98.5 F (36.9 C), temperature source Oral, resp. rate 20, height 5' (1.524 m), weight 88.4 kg, SpO2 96 %.  Medical Problem List and Plan: 1.  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  11/8- PEG this AM/today  2.  Antithrombotics: -DVT/anticoagulation: Lovenox             -antiplatelet therapy: Plavix 75 mg daily 3. Pain Management: Tylenol as needed.   10/25- controlled- con't regimen  10/26- taped R shoulder to help with pain- will see how does- by OT  11/7: pain appears to be well controlled 4. Mood: Provide emotional support             -antipsychotic agents: N/A 5. Neuropsych: This patient is not capable of making decisions on her own behalf. 6. Skin/Wound Care: Routine skin checks 7. Fluids/Electrolytes/Nutrition: Routine in and outs. 8.  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  Currently with a #4 cuffless trach as of 03/28/2020. Continue PMV as tolerated. Follow-up speech therapy. Tolerating room air.  9.  Post-stroke Dysphagia.    D1 nectars initially- now D1 thins  Continue tube feeds  Freewater flushes increased to 250 mL every 3 hours, has normal echo  10/25- BUN 24- slightly elevated-   10/28- D1 thins now-   10/30-31 eating  around 25-50% meals   -continue TF  11/2- con't TFs- still a little dry- esp needs water- will call family about possible PEG< since cannot go home with NGT.   11/3- called family- got con's permission to go ahead with PEG- placed IR consult (PA)  11/4- waiting to get PEG_ CT of abd/pelvis done  11/5- IT waiting for time off Plavix to do PEG- likely Monday- will monitor  11/7: Appreciate radiology discussion of PEG with patient and son- patient has been scheduled (date not noted)  11/8- getting PEG today- will make sure nutrition aware in AM 10. Uncontrolled diabetes mellitus with hyperglycemia.  Hemoglobin A1c 10.7.  NovoLog 10 units every 4 hours, Lantus insulin 24 units twice daily.   CBG (last 3)  Recent Labs    04/25/20 0354 04/25/20 0922 04/25/20 1154  GLUCAP 172* 165* 149*   Slightly elevated on 10/24, likely to be component as well  11/4- BGs stable -con't regimen  11/5- BGs look better- changed to TID Novolog- Lantus BID- con't regimen  11/6: CBG elevated to 217 this morning. Increased Lantus to 26U.   11/8- NPO today- will con't regimen for now 11.  Urinary retention.  Urecholine 10 mg 3 times daily.    Flomax started on 10/23            PVRs previously with retention 12.  Hyperlipidemia. Lipitor 13.  Tobacco abuse. Counseled on appropriate 14. Leukocytosis             WBCs 10.8 on 10/18, labs ordered for tomorrow  10/25- WBC 11.2- will check U/A and Cx to make sure doesn't have UTI  10/26- no UTI per U/A results  10/28- per son and pt when asked, "she's always had a higher WBC"- might need outpt w/u but nothing to worry about for ID-wise, here.   11/8- lowest it's been in weeks at 11.4k 15. Acute blood loss anemia:   Hgb 11.2 on 10/18, labs ordered for tomorrow 16. Constipation:   Improved. Had BM 11/5  11/7: No BM yesterday. Scheduled senna HS 17. Hypotension:   11/4- BP controlled- con't regimen 18. Trach for previous resp failure  10/30-31--no issues,  capped   -dc tomorrow potentially per primary team  11/1- had lower sats 90-92%- might have been mucus plug that resp got out? CXR shows nothing specific- MAYBE small L pleural effusion.  No fever, no increase in WBC above baseline. Will NOT take out trach, like planned and will put cap back on and monitor  11/2- wait to remove trach- sats 93% RA- but used to be 96% and above.  11/3- sats 96% and above- no suctioning yesterday= if OK, will remove trach tomorrow, if still doing OK.  11/4- will see if can decannulate today- call resp.  11/5- decannulated   11/8- breathing well    19. Dispo  11/2- will call family about PEG- because not eating well.   11/3- d/w family- will get PEG.    LOS: 26 days A FACE TO FACE EVALUATION WAS PERFORMED  Lajune Perine 04/25/2020, 4:47 PM

## 2020-04-25 NOTE — Progress Notes (Signed)
Physical Therapy Session Note  Patient Details  Name: Lineth Thielke MRN: [id number] Date of Birth: 12/12/1970  Today's Date: 04/25/2020 PT Individual Time: 1300-1325 PT Individual Time Calculation (min): 25 min   Short Term Goals: Week 1:  PT Short Term Goal 1 (Week 1): Patient will maintain static sitting balance with no more than CGA for >3 mins PT Short Term Goal 1 - Progress (Week 1): Met PT Short Term Goal 2 (Week 1): Patient will require no more than MaxA x1 for bed <> wc transfer PT Short Term Goal 2 - Progress (Week 1): Met PT Short Term Goal 3 (Week 1): Patient will initiate gait training as able PT Short Term Goal 3 - Progress (Week 1): Met Week 2:  PT Short Term Goal 1 (Week 2): Patient to propel w/c with S x 70' using hemitechnique PT Short Term Goal 1 - Progress (Week 2): Progressing toward goal PT Short Term Goal 2 (Week 2): Patient to perform static standing with min A x 1 minute for functional ADL's PT Short Term Goal 2 - Progress (Week 2): Met PT Short Term Goal 3 (Week 2): Patient to ambulate with max A x 10' with LRAD. PT Short Term Goal 3 - Progress (Week 2): Progressing toward goal PT Short Term Goal 4 (Week 2): Patient to initiate stair training. PT Short Term Goal 4 - Progress (Week 2): Met Week 3:  PT Short Term Goal 1 (Week 3): Patient to ambulate 3' with mod A and LRAD PT Short Term Goal 1 - Progress (Week 3): Not met PT Short Term Goal 2 (Week 3): Patient to negotiate 2 steps with mod A with rail PT Short Term Goal 2 - Progress (Week 3): Not met PT Short Term Goal 3 (Week 3): Patient to propel w/c x 65' with S PT Short Term Goal 3 - Progress (Week 3): Not met PT Short Term Goal 4 (Week 3): Patient to perform car transfer with mod A  Skilled Therapeutic Interventions/Progress Updates:    PAIN indicated pain RUE w/assisted positioning, pt positions without therapist assist, care taken w/RUE during session  Pt initially supine, reluctantly agrees to  treatment in room.  Appears worried, nod yes that she is concerned about planned PEG Pt supine to sit w/min assist.  Shoes/aircast donned by therapist.   Sit to stand w/mod assist to [place] and positions RUE w/min assist. Pt takes 2 steps forwards/back wmod assist of 2, therapist advancing and stabilizing RLE during task, pt moaning and clearly uncomfortable w/task. Repeated Sit to stand and steps as above x 3 efforts, seated rest between efforts. Sit to stand and sidestepping x 4 to r w/total assist to advance RLE.  Pt stood x 2 min w/cga w/rw, cues for posture.   Pt returned to sitting then attempts to lie down/nodding head "no".  Pt appears to be finished w/session, nods yes that she is "tired" when asked.  Sit to supine w/mod assist and cues for sequencing. Pt repositioned comfortably. Pt left supine w/rails up x 4, alarm set, bed in lowest position, and needs in reach.   Therapy Documentation Precautions:  Precautions Precautions: Fall Precaution Comments: NG, dense R Hemi, trach, aphasic Restrictions Weight Bearing Restrictions: No    Therapy/Group: Individual Therapy  Callie Fielding, [place] 04/25/2020, 1:28 PM

## 2020-04-26 ENCOUNTER — Encounter (HOSPITAL_COMMUNITY): Payer: Medicaid Other | Admitting: Occupational Therapy

## 2020-04-26 ENCOUNTER — Ambulatory Visit (HOSPITAL_COMMUNITY): Payer: Medicaid Other

## 2020-04-26 ENCOUNTER — Encounter (HOSPITAL_COMMUNITY): Payer: Medicaid Other

## 2020-04-26 ENCOUNTER — Inpatient Hospital Stay (HOSPITAL_COMMUNITY): Payer: Medicaid Other

## 2020-04-26 LAB — GLUCOSE, CAPILLARY
Glucose-Capillary: 133 mg/dL — ABNORMAL HIGH (ref 70–99)
Glucose-Capillary: 175 mg/dL — ABNORMAL HIGH (ref 70–99)
Glucose-Capillary: 184 mg/dL — ABNORMAL HIGH (ref 70–99)
Glucose-Capillary: 189 mg/dL — ABNORMAL HIGH (ref 70–99)
Glucose-Capillary: 225 mg/dL — ABNORMAL HIGH (ref 70–99)
Glucose-Capillary: 271 mg/dL — ABNORMAL HIGH (ref 70–99)

## 2020-04-26 MED ORDER — CLOPIDOGREL BISULFATE 75 MG PO TABS
75.0000 mg | ORAL_TABLET | Freq: Every day | ORAL | Status: DC
  Administered 2020-04-26 – 2020-04-29 (×4): 75 mg via ORAL
  Filled 2020-04-26 (×4): qty 1

## 2020-04-26 MED ORDER — PROSOURCE TF PO LIQD
45.0000 mL | Freq: Two times a day (BID) | ORAL | Status: DC
  Administered 2020-04-26 – 2020-04-28 (×4): 45 mL
  Filled 2020-04-26 (×4): qty 45

## 2020-04-26 MED ORDER — GLUCERNA 1.5 CAL PO LIQD
355.0000 mL | Freq: Three times a day (TID) | ORAL | Status: DC
  Administered 2020-04-26: 355 mL
  Administered 2020-04-27: 237 mL
  Filled 2020-04-26 (×5): qty 474

## 2020-04-26 MED ORDER — ENSURE MAX PROTEIN PO LIQD
11.0000 [oz_av] | Freq: Two times a day (BID) | ORAL | Status: DC
  Administered 2020-04-28: 11 [oz_av] via ORAL

## 2020-04-26 MED ORDER — TRAMADOL HCL 50 MG PO TABS
50.0000 mg | ORAL_TABLET | Freq: Four times a day (QID) | ORAL | Status: DC | PRN
  Administered 2020-04-26 – 2020-04-28 (×4): 50 mg
  Filled 2020-04-26 (×4): qty 1

## 2020-04-26 NOTE — Progress Notes (Signed)
Physical Therapy Session Note  Patient Details  Name: Deborah [name] MRN: [id number] Date of Birth: Aug 26, 1970  Today's Date: 04/26/2020 PT Individual Time: 1430-1530 PT Individual Time Calculation (min): 60 min   Short Term Goals: Week 4:  PT Short Term Goal 1 (Week 4): STG = LTG due to ELOS  Skilled Therapeutic Interventions/Progress Updates:    Patient seen with son and significant other present for continued caregiver training.  Patient up in w/c and c/o fatigue and sore from PEG placement yesterday.  Son reports working to get platform for her walker as weekend therapist stated she would need one.  Discussed that the platform has not been any more effective to keep her R hand on the walker than the hand splint so had not planned to get one for home.  Discussed would follow up with social worker about it.  Patient sit to stand to RW and placed R hand in splint.  Educated family that can use RW for standing, but little safer and pt preference to stand to stable surface such as counter, grabbar or sink.  Patient sit to stand to RW with son assist and he placed hand in splint.  Cues for safety stand to sit to remove her hand.  Patient indicated needing to toilet so son pushed her in chair to toilet and positioned with some cues for proximity to grabbar.  Son transferred to toilet with little help to remove brief as already soiled.  Patient sit to stand x 2 to grabbar with son's assist and PT performed toilet hygiene and educated about donning brief while seated to allow improved safety and positioning of pants during sit to stand.  Son pulled up pants and brief in standing with S and cues.  Son transferred back to w/c and educated on technique for helping her scoot back with unweighting side she needs to move.  In w/c son assisted pt to ortho gym.  Both son and significant other performed car transfer to simulated height of son's vehicle.  No PT assistance needed for transfer, but did encourage them  to cue her rigorously for hand placement to avoid her catching her hand in car door.  Patient assisted to room in w/c son pushing and significant other transferred her to bed via stand pivot.  Noted not letting pt get anterior weight shift so educated/demonstrated how to position himself to allow her to lean forward.  Patient left in supine with NT and family in the room.  Therapy Documentation Precautions:  Precautions Precautions: Fall Precaution Comments: NG, dense R Hemi, trach, aphasic Restrictions Weight Bearing Restrictions: No Pain: Pain Assessment Pain Scale: 0-10 Pain Score: 0-No pain Faces Pain Scale: Hurts a little bit    Therapy/Group: Individual Therapy  [name]  [place], PT 04/26/2020, 5:00 PM

## 2020-04-26 NOTE — Patient Care Conference (Signed)
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 Genice Rouge Care Coodinator Present: Lavera Guise, BSW;Kileigh Ortmann Marlyne Beards, RN, BSN, CRRN Nurse Present: Willey Blade, RN PT Present: [name], PT OT Present: Earleen Newport, OT SLP Present: Colin Benton, SLP PPS Coordinator present : Edson Snowball, [place], SLP     Current Status/Progress Goal Weekly Team Focus  Bowel/Bladder   Incontinent of bowel and bladder  Pt will paticipate in bowel and bladder program until she regains continent.  Assist patient with toileting needs every shift and prn.   Swallow/Nutrition/ Hydration   Dys 2 textures and thin liquids  Mod A  Tolerance of diet and education   ADL's   Min-Mod UB bathe/dress with hemi techniques, LB dress in standing Max A, Min-Mod sit to stands, toilet transfer Mod  Min-Mod A overall  toilet tasks and toilet transfers, need to practice BSC transfers, standing balance, sit to stands, RUE NMR, fam ed   Mobility   min to mod A transfer bed/chair and sit to stand, w/c mobility 50'  min A, sitting balance S, bed mobility min A, caregiver education ongoing  Goals updated to S sitting balance, min a standing balance, min A transfers, bed mobility, mod A car transfers, max A gait 10', min A w/c mobility  transfers, standing balance, R side awareness, w/c mobility, caregiver education   Communication   Max-mod A word level, Min A yes/no response with communication board, 6 cell communication board min-mod A  mod A basic  eductaion, use of communication board, response to yes/no questions   Safety/Cognition/ Behavioral Observations  Mod A  modA  education and basic problem solving   Pain   Pt c/o post surgical pain related to PEG placement-managed with tylenol  Pain level <3/10  Assess pain every shift and prn, treat as indicated   Skin      Skin will remain intact  Assess skin every shift and prn     Discharge Planning:  Discharging home with significant other and son. Family education Tuesday. DME ordered. Ramp built   Team Discussion: Incontinent B/B, Tylenol for pain, family education today. PEG tube now in place, can begin using it after 1600 today. OT reports patient is almost min assist with upper body, almost mod assist with lower body. PT reports patient is mod assist for transfers. Not walking a lot, focus is on W/C mobility and transfers. MD reports trach has been removed, plavix has been restarted, and Bun has improved. Patient on target to meet rehab goals: yes  *See Care Plan and progress notes for long and short-term goals.  Revisions to Treatment Plan:  MD to put in order to discontinue Cortrac.  Teaching Needs: Continue family education, begin education on PEG tube feedings, medications given through the tube, maintaining the skin around the insertion site.  Current Barriers to Discharge: Inaccessible home environment, Decreased caregiver support, Home enviroment access/layout, Incontinence, Wound care, Lack of/limited family support, Weight, Weight  bearing restrictions, Behavior and Nutritional means  Possible Resolutions to Barriers: Educate on wound care and dressing changes, weight bearing precautions, safety awareness, offer nutrition supplements, teach family skin hygiene after incontinent episodes.      Medical Summary Current Status: PEG placed- to be used at 4pm; LBM 11/8- incontinent x2; pain in Abd today- getting tylenol- restarted plavix- will add tramadol for pain  Barriers to Discharge: Behavior;Decreased family/caregiver support;Home enviroment access/layout;Nutrition means;Wound care;Weight;Other (comments);Weight bearing restrictions;Incontinence  Barriers to Discharge Comments: Abd pain- will add tramadol; will remove cortrak; just got PEG- waiting til 4pm to use. trach removed last week. healing. Possible Resolutions to [employer] Focus: min A-max A ADLs; family ed today- poor safety awareness- focusing on transfers/w/c; move d/c to friday 11/12- since PEG placed late.   Continued Need for Acute Rehabilitation Level of Care: The patient requires daily medical management by a physician with specialized training in physical medicine and rehabilitation for the following reasons: Direction of a multidisciplinary physical rehabilitation program to maximize functional independence : Yes Medical management of patient stability for increased activity during participation in an intensive rehabilitation regime.: Yes Analysis of laboratory values and/or radiology reports with any subsequent need for medication adjustment and/or medical intervention. : Yes   I attest that I was present, lead the team conference, and concur with the assessment and plan of the team.   Tennis Must 04/26/2020, 5:07 PM

## 2020-04-26 NOTE — Progress Notes (Signed)
Nutrition Follow-up  DOCUMENTATION CODES:   Obesity unspecified  INTERVENTION:   Initiate bolus tube feeding regimen via PEG: - If pt eats </= 50% of meal, provide bolus of 1.5 cartons (355 ml) of Glucerna 1.5 cal formula TID via PEG (total of 4.5 cartons daily)  First bolus: 0.5 carton (118 ml)  Second bolus: 1 carton (237 ml)  Third bolus (goal): 1.5 cartons (355 ml) - Provide ProSource TF 45 ml BID via PEG - Continue free water flushes of 250 ml 6 times daily  Full tube feeding regimen provides 1680 kcal, 110 grams of protein, and 809 ml of H2O (meets 93% of kcal and 100% of protein needs).  Total free water with flushes: 2309 ml  - Continue MVI with minerals daily  -ContinueMagicCupBID with meals, each supplement provides 290 kcal and 9 grams of protein  - Continue Ensure Max po BID, each supplement provides 150 kcal and 30 grams of protein  NUTRITION DIAGNOSIS:   Inadequate oral intake related to dysphagia as evidenced by NPO status.  Progressing, pt on dysphagia 2 diet with thin liquids  GOAL:   Patient will meet greater than or equal to 90% of their needs  Progressing  MONITOR:   PO intake, Diet advancement, Labs, Weight trends, TF tolerance, Skin, I & O's  REASON FOR ASSESSMENT:   Consult Enteral/tube feeding initiation and management  ASSESSMENT:   49 year old female with PMH of DM, HLD, and tobacco abuse. Presented on 03/08/20 with right-sided hemiparesis and aphasia. Pt was found to have left MCA CVA. Pt underwent thrombectomy per IR. Pt required intubation and underwent tracheostomy on 03/13/20. Pt is currently NPO with Cortrak in place. Admitted to CIR on 10/13.  10/15 - diet advanced to dysphagia 1 with nectar-thick liquids 10/25 - upgraded to thin liquids 11/04 - decannulated 11/05 - diet advanced to dysphagia 2 with thin liquids 11/08 - s/p PEG  Discussed pt with RN and MD. Plan to initiate bolus tube feeding regimen since PEG has been  cleared for use. Orders placed.  Spoke with pt at bedside. Pt still with Cortrak NG tube in place. Suspect Cortrak will be removed now that PEG has been cleared for use.  Current weight: 88.6kg Admit weight: 90.1 kg  Meal Completion: 30-100% x last 8 documented meals  Medications reviewed and include: colace, SSI q 4 hours, Novolog 10 units TID after meals, lantus 26 units BID, MVI with minerals daily, Ensure Max TID, senna  Labs reviewed. CBG's: 149-230 x 24 hours  Diet Order:   Diet Order            DIET DYS 2 Room service appropriate? Yes; Fluid consistency: Thin  Diet effective now                 EDUCATION NEEDS:   No education needs have been identified at this time  Skin:  Skin Assessment: Reviewed RN Assessment  Last BM:  04/25/20  Height:   Ht Readings from Last 1 Encounters:  03/31/20 5' (1.524 m)    Weight:   Wt Readings from Last 1 Encounters:  04/26/20 88.6 kg    BMI:  Body mass index is 38.15 kg/m.  Estimated Nutritional Needs:   Kcal:  1800-2000  Protein:  95-110 grams  Fluid:  >/= 1.8 L    Earma Reading, MS, RD, LDN Inpatient Clinical Dietitian Please see [facility] for contact information.

## 2020-04-26 NOTE — Progress Notes (Signed)
Patient ID: Deborah [name], female   DOB: 04/18/1971, 49 y.o.   MRN: [id number]   SW provided son with team conference updates. Son is currently not in agreeable with patient's new discharge date due to already making and confirming arrangements for patient and himself. Son confirmed arrangements with his job on Sunday and his boss agreed to him being off today (family education) and tomorrow for discharge. SW has followed up with Dr. Berline Chough. Will keep son and team updated of any changes.  [place], [place] [phone]

## 2020-04-26 NOTE — Progress Notes (Signed)
Patient ID: Deborah [name], female   DOB: [birth year], 49 y.o.   MRN: [id number]   SW has followed up with son after discussion with Dr. Berline Chough. Son is agreeable to making arrangements for patient to discharge home on Friday (11/12) vs. Tomorrow (11/10). Family will attend education tomorrow to be educated on patient PEG. SW provided handicapped placement card application. SW also informed family to contact [facility] back in reference to patient rolling walker and wheelchair. Sw will also follow up with [facility].   [place], [place] [phone]

## 2020-04-26 NOTE — Progress Notes (Signed)
Occupational Therapy Session Note  Patient Details  Name: Deborah [name] MRN: [id number] Date of Birth: 1970-06-30  Today's Date: 04/26/2020 OT Individual Time: 1300-1345 OT Individual Time Calculation (min): 45 min    Short Term Goals: Week 4:  OT Short Term Goal 1 (Week 4): STGs = LTGs d/t ELOS  Skilled Therapeutic Interventions/Progress Updates:    Pt greeted at time of session sitting up in wheelchair with son and SO present for family ed, discussing DC planning, home meds, and PEG needs with RN. Family is very involved and had numerous questions regarding DC date as it has now been moved to 11/12. Pt with discomfort at time of session and mildly lethargic but had just been premedicated for pain at PEG site. Family ed included but not limited to bathing and dressing, ADL transfers, DME needs, ROM techniques. Pt sit to stand at sink level with R knee block Mod/Max A as pt was in more pain today for LB dressing to don over hips, son assisted with donning pants. UB dress with shirt only with SO donning shirt, and remembering hemidressing techniques from previous family ed with good carryover. Reviewed ROM techniques for RUE with son, good recall noted for end range and staying within 90* for shoulder flex/aduction to prevent injury. MD present toward end of session to provide information for family regarding medications and DC planning. Pt left up in wheelchair positioned for comfort and RUE positioned, left with speech therapy for continued family education. Plan to continue family ed in future sessions for ADL training.   Therapy Documentation Precautions:  Precautions Precautions: Fall Precaution Comments: NG, dense R Hemi, trach, aphasic Restrictions Weight Bearing Restrictions: No     Therapy/Group: Individual Therapy  Erasmo Score 04/26/2020, 4:39 PM

## 2020-04-26 NOTE — Progress Notes (Signed)
PHYSICAL MEDICINE & REHABILITATION PROGRESS NOTE   Subjective/Complaints:  Pt has PEG- got yesterday- still has Cortrak and hasn't gotten anything per PEG yet- no note so far from Radiology saying can use PEG yet- so will wait to call nutrition until OK.  Ate <25% of breakfast this AM.     Pt appeared more sad/sad affect today.     ROS: limited due to aphasia   Objective:   IR GASTROSTOMY TUBE MOD SED  Result Date: 04/25/2020 CLINICAL DATA:  Cerebral infarction, dysphagia and need for percutaneous gastrostomy tube. EXAM: PERCUTANEOUS GASTROSTOMY TUBE PLACEMENT ANESTHESIA/SEDATION: 1.5 mg IV Versed; 50 mcg IV Fentanyl. Total Moderate Sedation Time 21 minutes. The patient's level of consciousness and physiologic status were continuously monitored during the procedure by Radiology nursing. CONTRAST:  59m OMNIPAQUE IOHEXOL 300 MG/ML  SOLN MEDICATIONS: 2 g IV Ancef. IV antibiotic was administered in an appropriate time interval prior to needle puncture of the skin. FLUOROSCOPY TIME:  1 minutes and 42 seconds.  24.0 mGy. PROCEDURE: The procedure, risks, benefits, and alternatives were explained to the patient's son. Questions regarding the procedure were encouraged and answered. The patient's son understands and consents to the procedure. A time-out was performed prior to initiating the procedure. A 5-French catheter was then advanced through the patient's mouth under fluoroscopy into the esophagus and to the level of the stomach. This catheter was used to insufflate the stomach with air under fluoroscopy. The abdominal wall was prepped with Betadine in a sterile fashion, and a sterile drape was applied covering the operative field. A sterile gown and sterile gloves were used for the procedure. Local anesthesia was provided with 1% Lidocaine. A skin incision was made in the upper abdominal wall. Under fluoroscopy, an 18 gauge trocar needle was advanced into the stomach. Contrast injection  was performed to confirm intraluminal position of the needle tip. A single T tack was then deployed in the lumen of the stomach. This was brought up to tension at the skin surface. Over a guidewire, a 9-French sheath was advanced into the lumen of the stomach. The wire was left in place as a safety wire. A loop snare device from a percutaneous gastrostomy kit was then advanced into the stomach. A floppy guide wire was advanced through the orogastric catheter under fluoroscopy in the stomach. The loop snare advanced through the percutaneous gastric access was used to snare the guide wire. This allowed withdrawal of the loop snare out of the patient's mouth by retraction of the orogastric catheter and wire. A 20-French bumper retention gastrostomy tube was looped around the snare device. It was then pulled back through the patient's mouth. The retention bumper was brought up to the anterior gastric wall. The T tack suture was cut at the skin. The exiting gastrostomy tube was cut to appropriate length and a feeding adapter applied. The catheter was injected with contrast material to confirm position and a fluoroscopic spot image saved. The tube was then flushed with saline. A dressing was applied over the gastrostomy exit site. COMPLICATIONS: None. FINDINGS: The stomach distended well with air allowing safe placement of the gastrostomy tube. After placement, the tip of the gastrostomy tube lies in the body of the stomach. IMPRESSION: Percutaneous gastrostomy with placement of a 20-French bumper retention tube in the body of the stomach. This tube can be used for percutaneous feeds beginning in 24 hours after placement. Electronically Signed   By: GAletta EdouardM.D.   On: 04/25/2020 17:01  Recent Labs    04/25/20 0732  WBC 11.4*  HGB 12.4  HCT 38.0  PLT 395   Recent Labs    04/25/20 0732  NA 137  K 4.4  CL 100  CO2 23  GLUCOSE 166*  BUN 21*  CREATININE 0.91  CALCIUM 9.4    Intake/Output Summary  (Last 24 hours) at 04/26/2020 0902 Last data filed at 04/26/2020 0700 Gross per 24 hour  Intake 185 ml  Output --  Net 185 ml        Physical Exam: Vital Signs Blood pressure 128/72, pulse 93, temperature 98 F (36.7 C), resp. rate 15, height 5' (1.524 m), weight 88.6 kg, SpO2 95 %. General: pt laying in bed; appropriate, sad affect compared to normal, NAD HEENT: has cortrak in place- no change Neck: Supple without JVD or lymphadenopathy Heart: RRR Chest: CTA B/L- no W/R/R- good air movement Abdomen: soft, slightly TTP- jumped with palpation; ND; hypoactive BS Psych: kept saying "yes"- sad affect- usually bright Musc: No edema in extremities.  No tenderness in extremities. Neuro: Awakens easily. Severe expressive aphasia. Follows simple commands- kept  peseverating  on "yes" Motor: Limited due to participation, but spontaneously moving left side, no movement noted on right side, no R side movement- moving L side well No abnl tone   Assessment/Plan: 1. Functional deficits secondary to L MCA stroke with aphasia, and R heimplegia  which require 3+ hours per day of interdisciplinary therapy in a comprehensive inpatient rehab setting.  Physiatrist is providing close team supervision and 24 hour management of active medical problems listed below.  Physiatrist and rehab team continue to assess barriers to discharge/monitor patient progress toward functional and medical goals  Care Tool:  Bathing    Body parts bathed by patient: Chest, Abdomen, Right upper leg, Left upper leg, Face, Front perineal area, Right lower leg, Left lower leg, Right arm, Buttocks   Body parts bathed by helper: Left arm, Buttocks     Bathing assist Assist Level: Moderate Assistance - Patient 50 - 74% (Min-Mod)     Upper Body Dressing/Undressing Upper body dressing   What is the patient wearing?: Pull over shirt    Upper body assist Assist Level: Moderate Assistance - Patient 50 - 74%    Lower Body  Dressing/Undressing Lower body dressing      What is the patient wearing?: Pants, Underwear/pull up     Lower body assist Assist for lower body dressing: Maximal Assistance - Patient 25 - 49%     Toileting Toileting Toileting Activity did not occur (Clothing management and hygiene only): N/A (no void or bm)  Toileting assist Assist for toileting: Maximal Assistance - Patient 25 - 49%     Transfers Chair/bed transfer  Transfers assist     Chair/bed transfer assist level: Moderate Assistance - Patient 50 - 74%     Locomotion Ambulation   Ambulation assist   Ambulation activity did not occur: Safety/medical concerns (due to patient fatigue/weakness)  Assist level: 2 helpers Assistive device: Walker-platform (& walking sling on maxisky) Max distance: 4   Walk 10 feet activity   Assist  Walk 10 feet activity did not occur: Safety/medical concerns (due to patient fatigue/weakness)  Assist level: 2 helpers Assistive device: Lite Gait   Walk 50 feet activity   Assist Walk 50 feet with 2 turns activity did not occur: Safety/medical concerns (due to patient fatigue/weakness)         Walk 150 feet activity   Assist Walk 150  feet activity did not occur: Safety/medical concerns (due to patient fatigue/weakness)         Walk 10 feet on uneven surface  activity   Assist Walk 10 feet on uneven surfaces activity did not occur: Safety/medical concerns (due to patient fatigue/weakness)         Wheelchair     Assist Will patient use wheelchair at discharge?: Yes Type of Wheelchair: Manual    Wheelchair assist level: Minimal Assistance - Patient > 75% Max wheelchair distance: 69'    Wheelchair 50 feet with 2 turns activity    Assist        Assist Level: Minimal Assistance - Patient > 75%   Wheelchair 150 feet activity     Assist      Assist Level: Moderate Assistance - Patient 50 - 74%   Blood pressure 128/72, pulse 93,  temperature 98 F (36.7 C), resp. rate 15, height 5' (1.524 m), weight 88.6 kg, SpO2 95 %.  Medical Problem List and Plan: 1.  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  11/8- PEG this AM/today   11/9- got PEG- waiting to use- need to hear from Radiology 2.  Antithrombotics: -DVT/anticoagulation: Lovenox             -antiplatelet therapy: Plavix 75 mg daily 3. Pain Management: Tylenol as needed.   10/25- controlled- con't regimen  10/26- taped R shoulder to help with pain- will see how does- by OT  11/7: pain appears to be well controlled 4. Mood: Provide emotional support  11/9- appears sad this AM- thinking sad about PEG             -antipsychotic agents: N/A 5. Neuropsych: This patient is not capable of making decisions on her own behalf. 6. Skin/Wound Care: Routine skin checks 7. Fluids/Electrolytes/Nutrition: Routine in and outs. 8.  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  Currently with a #4 cuffless trach as of 03/28/2020. Continue PMV as tolerated. Follow-up speech therapy. Tolerating room air.  9.  Post-stroke Dysphagia.    D1 nectars initially- now D1 thins  Continue tube feeds  [place] flushes increased to 250 mL every 3 hours, has normal echo  10/25- BUN 24- slightly elevated-   10/28- D1 thins now-   10/30-31 eating around 25-50% meals   -continue TF  11/2- con't TFs- still a little dry- esp needs water- will call family about possible PEG< since cannot go home with NGT.   11/3- called family- got con's permission to go ahead with PEG- placed IR consult (PA)  11/4- waiting to get PEG_ CT of abd/pelvis done  11/5- IT waiting for time off Plavix to do PEG- likely Monday- will monitor  11/7: Appreciate radiology discussion  of PEG with patient and son- patient has been scheduled (date not noted)  11/8- getting PEG today- will make sure nutrition aware in AM  11/9- got PEG_ waiting to use- havent' heard from Radiology- when we do, will call nutrition.  10. Uncontrolled diabetes mellitus with hyperglycemia.  Hemoglobin A1c 10.7.  NovoLog 10 units every 4 hours, Lantus insulin 24 units twice daily.   CBG (last 3)  Recent Labs    04/25/20 2330 04/26/20 0408 04/26/20 0738  GLUCAP 170* 189* 225*   Slightly elevated on 10/24,  likely to be component as well  11/4- BGs stable -con't regimen  11/5- BGs look better- changed to TID Novolog- Lantus BID- con't regimen  11/6: CBG elevated to 217 this morning. Increased Lantus to 26U.   11/8- NPO today- will con't regimen for now  11/9- wait to change since was NPO all day yesterday 11.  Urinary retention.  Urecholine 10 mg 3 times daily.    Flomax started on 10/23            PVRs previously with retention 12.  Hyperlipidemia. Lipitor 13.  Tobacco abuse. Counseled on appropriate 14. Leukocytosis             WBCs 10.8 on 10/18, labs ordered for tomorrow  10/25- WBC 11.2- will check U/A and Cx to make sure doesn't have UTI  10/26- no UTI per U/A results  10/28- per son and pt when asked, "she's always had a higher WBC"- might need outpt w/u but nothing to worry about for ID-wise, here.   11/8- lowest it's been in weeks at 11.4k 15. Acute blood loss anemia:   Hgb 11.2 on 10/18, labs ordered for tomorrow 16. Constipation:   Improved. Had BM 11/5  11/7: No BM yesterday. Scheduled senna HS 17. Hypotension:   11/4- BP controlled- con't regimen 18. Trach for previous resp failure  10/30-31--no issues, capped   -dc tomorrow potentially per primary team  11/1- had lower sats 90-92%- might have been mucus plug that resp got out? CXR shows nothing specific- MAYBE small L pleural effusion.  No fever, no increase in WBC above baseline. Will NOT take out trach, like planned and  will put cap back on and monitor  11/2- wait to remove trach- sats 93% RA- but used to be 96% and above.  11/3- sats 96% and above- no suctioning yesterday= if OK, will remove trach tomorrow, if still doing OK.  11/4- will see if can decannulate today- call resp.  11/5- decannulated   11/8- breathing well    19. Dispo  11/2- will call family about PEG- because not eating well.   11/3- d/w family- will get PEG.  11/9- got PEG- restarting Plavix.     LOS: 27 days A FACE TO FACE EVALUATION WAS PERFORMED  Deborah [name] 04/26/2020, 9:02 AM

## 2020-04-26 NOTE — Progress Notes (Signed)
Patient ID: Deborah [name], female   DOB: September 06, 1970, 49 y.o.   MRN: [id number] Team Conference Report to Patient/Family  Team Conference discussion was reviewed with the patient and caregiver, including goals, any changes in plan of care and target discharge date.  Patient and caregiver express understanding and are not in agreement (see progress notes).  The patient has a target discharge date of 04/29/20.  Andria Rhein 04/26/2020, 12:10 PM

## 2020-04-26 NOTE — Progress Notes (Signed)
Handouts provided to family in reference to blood sugars, peg tube maintenance, administering medications, food, and care at home. Asked to go over handouts and hands on teaching will be completed tomorrow 11/10. Family was on the way out when finished with family ed regarding therapy which is why handouts provided.  Tramadol worked well with controlling pain level today.  Peg tube was used after dinner meal without complications and cortrak removed. Pt tolerated well, will continue plan of care.

## 2020-04-26 NOTE — Progress Notes (Signed)
Referring Physician(s): Dr Daleen Squibb  Supervising Physician: Jacqulynn Cadet  Patient Status:  [facility] - In-pt  Chief Complaint:  Percutaneous gastric tube placed in IR 11/8  Subjective:  Up in bed Rehab in room G tube in place  Allergies: Aspirin and Mushroom extract complex  Medications: Prior to Admission medications   Medication Sig Start Date End Date Taking? Authorizing Provider  acetaminophen (TYLENOL) 325 MG tablet Take 2 tablets (650 mg total) by mouth every 4 (four) hours as needed for mild pain (or temp > 37.5 C (99.5 F)). 03/30/20  Yes Donzetta Starch, NP  atorvastatin (LIPITOR) 80 MG tablet Place 1 tablet (80 mg total) into feeding tube daily. 03/31/20  Yes Donzetta Starch, NP  bethanechol (URECHOLINE) 10 MG tablet Place 1 tablet (10 mg total) into feeding tube 3 (three) times daily. 03/30/20  Yes Donzetta Starch, NP  chlorhexidine gluconate, MEDLINE KIT, (PERIDEX) 0.12 % solution 15 mLs by Mouth Rinse route 2 (two) times daily. 03/30/20  Yes Donzetta Starch, NP  clopidogrel (PLAVIX) 75 MG tablet Place 1 tablet (75 mg total) into feeding tube daily. 03/31/20  Yes Donzetta Starch, NP  enoxaparin (LOVENOX) 40 MG/0.4ML injection Inject 0.4 mLs (40 mg total) into the skin daily. 03/30/20  Yes Donzetta Starch, NP  insulin aspart (NOVOLOG) 100 UNIT/ML injection Inject 0-15 Units into the skin every 4 (four) hours. 03/30/20  Yes Donzetta Starch, NP  insulin aspart (NOVOLOG) 100 UNIT/ML injection Inject 10 Units into the skin every 4 (four) hours. 03/30/20  Yes Donzetta Starch, NP  insulin glargine (LANTUS) 100 UNIT/ML injection Inject 0.25 mLs (25 Units total) into the skin 2 (two) times daily. 03/30/20  Yes Donzetta Starch, NP  Multiple Vitamin (MULTIVITAMIN WITH MINERALS) TABS tablet Place 1 tablet into feeding tube daily. 03/31/20  Yes Donzetta Starch, NP  Nutritional Supplements (FEEDING SUPPLEMENT, GLUCERNA 1.5 CAL,) LIQD Place 1,000 mLs into feeding tube continuous. 03/30/20   Yes Donzetta Starch, NP  Nutritional Supplements (FEEDING SUPPLEMENT, PROSOURCE TF,) liquid Place 45 mLs into feeding tube daily. 03/31/20  Yes Donzetta Starch, NP  polyethylene glycol (MIRALAX / GLYCOLAX) 17 g packet Place 17 g into feeding tube daily. 03/31/20  Yes Donzetta Starch, NP  senna-docusate (SENOKOT-S) 8.6-50 MG tablet Place 1 tablet into feeding tube at bedtime as needed for mild constipation or moderate constipation. 03/30/20  Yes Donzetta Starch, NP  sodium chloride 0.9 % infusion Inject 50 mLs into the vein continuous. 03/30/20  Yes Donzetta Starch, NP  dextrose 50 % solution Inject 0-50 mLs into the vein as needed for low blood sugar. 03/30/20   Donzetta Starch, NP  docusate (COLACE) 50 MG/5ML liquid Place 10 mLs (100 mg total) into feeding tube 2 (two) times daily. 03/30/20   Donzetta Starch, NP     Vital Signs: BP 128/72 (BP Location: Left Arm)   Pulse 93   Temp 98 F (36.7 C)   Resp 15   Ht 5' (1.524 m)   Wt 195 lb 5.2 oz (88.6 kg)   LMP  (LMP Unknown)   SpO2 95%   BMI 38.15 kg/m   Physical Exam Abdominal:     General: Bowel sounds are normal.     Tenderness: There is abdominal tenderness.  Skin:    General: Skin is warm.     Comments: Skin site is clean and dry No bleeding Mild tenderness     Imaging: IR  GASTROSTOMY TUBE MOD SED  Result Date: 04/25/2020 CLINICAL DATA:  Cerebral infarction, dysphagia and need for percutaneous gastrostomy tube. EXAM: PERCUTANEOUS GASTROSTOMY TUBE PLACEMENT ANESTHESIA/SEDATION: 1.5 mg IV Versed; 50 mcg IV Fentanyl. Total Moderate Sedation Time 21 minutes. The patient's level of consciousness and physiologic status were continuously monitored during the procedure by Radiology nursing. CONTRAST:  54m OMNIPAQUE IOHEXOL 300 MG/ML  SOLN MEDICATIONS: 2 g IV Ancef. IV antibiotic was administered in an appropriate time interval prior to needle puncture of the skin. FLUOROSCOPY TIME:  1 minutes and 42 seconds.  24.0 mGy. PROCEDURE: The  procedure, risks, benefits, and alternatives were explained to the patient's son. Questions regarding the procedure were encouraged and answered. The patient's son understands and consents to the procedure. A time-out was performed prior to initiating the procedure. A 5-French catheter was then advanced through the patient's mouth under fluoroscopy into the esophagus and to the level of the stomach. This catheter was used to insufflate the stomach with air under fluoroscopy. The abdominal wall was prepped with Betadine in a sterile fashion, and a sterile drape was applied covering the operative field. A sterile gown and sterile gloves were used for the procedure. Local anesthesia was provided with 1% Lidocaine. A skin incision was made in the upper abdominal wall. Under fluoroscopy, an 18 gauge trocar needle was advanced into the stomach. Contrast injection was performed to confirm intraluminal position of the needle tip. A single T tack was then deployed in the lumen of the stomach. This was brought up to tension at the skin surface. Over a guidewire, a 9-French sheath was advanced into the lumen of the stomach. The wire was left in place as a safety wire. A loop snare device from a percutaneous gastrostomy kit was then advanced into the stomach. A floppy guide wire was advanced through the orogastric catheter under fluoroscopy in the stomach. The loop snare advanced through the percutaneous gastric access was used to snare the guide wire. This allowed withdrawal of the loop snare out of the patient's mouth by retraction of the orogastric catheter and wire. A 20-French bumper retention gastrostomy tube was looped around the snare device. It was then pulled back through the patient's mouth. The retention bumper was brought up to the anterior gastric wall. The T tack suture was cut at the skin. The exiting gastrostomy tube was cut to appropriate length and a feeding adapter applied. The catheter was injected with  contrast material to confirm position and a fluoroscopic spot image saved. The tube was then flushed with saline. A dressing was applied over the gastrostomy exit site. COMPLICATIONS: None. FINDINGS: The stomach distended well with air allowing safe placement of the gastrostomy tube. After placement, the tip of the gastrostomy tube lies in the body of the stomach. IMPRESSION: Percutaneous gastrostomy with placement of a 20-French bumper retention tube in the body of the stomach. This tube can be used for percutaneous feeds beginning in 24 hours after placement. Electronically Signed   By: GAletta EdouardM.D.   On: 04/25/2020 17:01    Labs:  CBC: Recent Labs    04/04/20 0516 04/11/20 0617 04/18/20 0803 04/25/20 0732  WBC 10.8* 11.2* 13.1* 11.4*  HGB 11.2* 11.3* 11.8* 12.4  HCT 35.0* 34.9* 36.8 38.0  PLT 358 280 382 395    COAGS: Recent Labs    03/08/20 1151  INR 1.1  APTT 25    BMP: Recent Labs    03/17/20 0426 03/17/20 0426 03/19/20 0156 03/19/20 0156 03/21/20  0092 03/21/20 0215 03/22/20 0123 03/23/20 0327 04/04/20 0516 04/11/20 0617 04/18/20 0803 04/25/20 0732  NA 143   < > 138   < > 135   < > 131*   < > 139 138 139 137  K 3.8   < > 4.6   < > 5.3*   < > 4.5   < > 4.4 4.3 4.5 4.4  CL 105   < > 102   < > 100   < > 97*   < > 102 103 101 100  CO2 26   < > 25   < > 22   < > 19*   < > 26 24 24 23   GLUCOSE 194*   < > 268*   < > 275*   < > 306*   < > 140* 157* 153* 166*  BUN 22*   < > 24*   < > 24*   < > 25*   < > 29* 24* 26* 21*  CALCIUM 9.0   < > 9.2   < > 9.3   < > 9.0   < > 9.4 9.4 9.6 9.4  CREATININE 0.81   < > 0.88   < > 0.87   < > 0.78   < > 0.83 0.78 0.92 0.91  GFRNONAA >60   < > >60   < > >60   < > >60   < > >60 >60 >60 >60  GFRAA >60  --  >60  --  >60  --  >60  --   --   --   --   --    < > = values in this interval not displayed.    LIVER FUNCTION TESTS: Recent Labs    03/08/20 1151 03/31/20 0544 04/01/20 0449  BILITOT 0.4 0.6 0.7  AST 24 27 32  ALT  30 30 30   ALKPHOS 169* 96 94  PROT 7.1 6.6 7.0  ALBUMIN 3.4* 2.8* 3.1*    Assessment and Plan:  G tube intact May use now  Electronically Signed: Lavonia Drafts, PA-C 04/26/2020, 10:32 AM   I spent a total of 15 Minutes at the the patient's bedside AND on the patient's hospital floor or unit, greater than 50% of which was counseling/coordinating care for G tube placement

## 2020-04-26 NOTE — Progress Notes (Signed)
Speech Language Pathology Daily Session Note  Patient Details  Name: Deborah [name] MRN: [id number] Date of Birth: July 24, 1970  Today's Date: 04/26/2020 SLP Individual Time: 1347-1430 SLP Individual Time Calculation (min): 43 min  Short Term Goals: Week 4: SLP Short Term Goal 1 (Week 4): STG's=LTG's (anticipated discharge 11/10)  Skilled Therapeutic Interventions:Skilled ST services focused on education and language skills. Pt's boyfriend and son were present for education. SLP provided education including handouts, pertaining to dys 2 textures and thin liquids, expressive/recpetive aphasia and apraxia. Family asked appropriate questions pertaining to diet textures and SLP provided further explanation and examples. SLP provided education to pertaining to utilize yes/no questions supported by picture communication board (pt demonstrated 80% accuracy to immediate environment/biographical questions) and general 6 cell picture communication board to aid in expression of wants/needs (pt demonstrated 83% accuracy in identification of requested picture.) Pt demonstrated ability name 1 out 3 common objects increasing to 2 out 3 common objects with sentence completion and phonemic cues. Pt demonstrated ability to write first name mod I with underlined spaces and reduced perseveration when witting 3 letter word. All questions were answered to satisfaction, however recommend continued education if possible prior to discharge. Pt was left in room with incoming PT and family. SLP recommends to continue skilled services.      Pain Pain Assessment Pain Scale: Faces Pain Score: 0-No pain Faces Pain Scale: Hurts a little bit Pain Type: Acute pain Pain Location: Abdomen Pain Orientation: Right Pain Descriptors / Indicators: Grimacing;Guarding Pain Frequency: Intermittent Pain Onset: On-going Pain Intervention(s): Medication (See eMAR)  Therapy/Group: Individual Therapy  Dakota Vanwart  [facility] 04/26/2020, 12:49  PM

## 2020-04-26 NOTE — Progress Notes (Addendum)
Speech Language Pathology Daily Session Note  Patient Details  Name: Deborah [name] MRN: [id number] Date of Birth: 1970-11-04  Today's Date: 04/26/2020 SLP Individual Time: 1001-1030 and 833-900 SLP Individual Time Calculation (min): 29 min and 27 min  Short Term Goals: Week 4: SLP Short Term Goal 1 (Week 4): STG's=LTG's (anticipated discharge 11/10)  Skilled Therapeutic Interventions: #1 Skilled ST services focused on language skills. SLP facilitated verbalization of initial /b/ and /m/ CV and CVC words with pictures. Pt demonstrated ability to verbalize 3 out 5 CV initial /b/ words and 5 out 8 CVC initial /b/ words with sentence completion cues. Pt demonstrated ability to verbalization novel initial /m/ words in CV and CVC combination in 2 out 9 opportunities with sentence completion cues and 3 out 9 with sentence completion cues in 2nd trial. Pt demonstrated ability to repeat all initial /b/ and /m/ words. Pt demonstrated ability to express wants/needs via yes/no questions and use of communication board when prompted. Pt was left in room with call bell within reach and bed alarm set. SLP recommends to continue skilled services.  #2 Skilled ST services focused on language skills. SLP facilitated identification common objects in a field of 3, pt demonstrated 8 out 10 accuracy. Pt demonstrated ability to match black/white action pictures to objects in a field of 3 in 4 out 5 opportunities.Pt was unable to match objects to word in 0/2 opportunities. Pt demonstrated increase ability to write first name to mod I with underlined spaces following 3 trials. Pt was left in room with call bell within reach and bed alarm set. SLP recommends to continue skilled services.      Pain Pain Assessment Pain Scale: Faces Pain Score: 0-No pain Faces Pain Scale: Hurts a little bit Pain Type: Acute pain Pain Location: Abdomen Pain Orientation: Right Pain Descriptors / Indicators: Grimacing;Guarding Pain  Frequency: Intermittent Pain Onset: On-going Pain Intervention(s): Medication (See eMAR)  Therapy/Group: Individual Therapy  Densil Ottey  [facility] 04/26/2020, 12:47 PM

## 2020-04-27 ENCOUNTER — Inpatient Hospital Stay (HOSPITAL_COMMUNITY): Payer: Medicaid Other | Admitting: Occupational Therapy

## 2020-04-27 ENCOUNTER — Ambulatory Visit (HOSPITAL_COMMUNITY): Payer: Medicaid Other

## 2020-04-27 ENCOUNTER — Inpatient Hospital Stay (HOSPITAL_COMMUNITY): Payer: Medicaid Other | Admitting: Speech Pathology

## 2020-04-27 ENCOUNTER — Ambulatory Visit (HOSPITAL_COMMUNITY): Payer: Medicaid Other | Admitting: Occupational Therapy

## 2020-04-27 LAB — GLUCOSE, CAPILLARY
Glucose-Capillary: 95 mg/dL (ref 70–99)
Glucose-Capillary: 92 mg/dL (ref 70–99)
Glucose-Capillary: 216 mg/dL — ABNORMAL HIGH (ref 70–99)
Glucose-Capillary: 125 mg/dL — ABNORMAL HIGH (ref 70–99)
Glucose-Capillary: 188 mg/dL — ABNORMAL HIGH (ref 70–99)

## 2020-04-27 MED ORDER — GLUCERNA 1.5 CAL PO LIQD
780.0000 mL | ORAL | Status: DC
  Administered 2020-04-27: 780 mL
  Filled 2020-04-27: qty 948

## 2020-04-27 MED ORDER — ONDANSETRON HCL 4 MG PO TABS
4.0000 mg | ORAL_TABLET | Freq: Three times a day (TID) | ORAL | Status: DC | PRN
  Administered 2020-04-27: 4 mg
  Filled 2020-04-27: qty 1

## 2020-04-27 NOTE — Progress Notes (Signed)
Physical Therapy Session Note  Patient Details  Name: Deborah [name] MRN: [id number] Date of Birth: Feb 02, 1971  Today's Date: 04/27/2020 PT Individual Time: 1300-1400 PT Individual Time Calculation (min): 60 min   Short Term Goals: Week 4:  PT Short Term Goal 1 (Week 4): STG = LTG due to ELOS  Skilled Therapeutic Interventions/Progress Updates:    Patient seen with son and significant other present.  They report planned to have education today from nursing on PEG use and diabetes management.  RN and case manager made aware.  Patient transferred to bed min A with cues and time.  Educating caregiver throughout on technique.  Sit to supine with min A.  Performed/demonsrated stretching for R LE to son and he return demonstrated for ankle plantarflexors, hip extensors and knee flexors holding 20 seconds with pt counting for distraction due to discomfort.  Min cues from PT for pushing into higher stretch till pt demonstrated mild discomfort.  Patient requesting to toilet.  Supine to sit with min A and transfer to w/c min A to L.  In bathroom mod A to toilet with  A for clothing management.  Patient sit to stand with son A and A for clothing management, then transferred to w/c.  Noted R LE buckling so educated okay to have her return to sit on toilet prior to going to w/c.  Patient transferred to w/c in room that had been delivered for home with mod A and increased time to scoot back due to height of chair, but pt able to assist when legrests on chair.  Noted was not hemi height and discussed with son and s.o. and felt better for them since higher and pt with difficulty self propelling anyway.  Patient pushed in w/c to dayroom and transferred to [facility] performed 2 bouts of 30 sec to 1 min with LE's seated at 30 cm/sec with min to mod cues for R LE use.  Patient to w/c mod to max A and pushed in w/c to room for handoff to OT.   Therapy Documentation Precautions:  Precautions Precautions:  Fall Precaution Comments: NG, dense R Hemi, trach, aphasic Restrictions Weight Bearing Restrictions: No Pain: Pain Assessment Pain Scale: Faces Faces Pain Scale: No hurt    Therapy/Group: Individual Therapy  [name]  [place], PT 04/27/2020, 5:05 PM

## 2020-04-27 NOTE — Progress Notes (Signed)
Speech Language Pathology Daily Session Note  Patient Details  Name: Deborah [name] MRN: [id number] Date of Birth: 01/01/1971  Today's Date: 04/27/2020 SLP Individual Time: 0900-0945 SLP Individual Time Calculation (min): 45 min  Short Term Goals: Week 4: SLP Short Term Goal 1 (Week 4): STG's=LTG's (anticipated discharge 11/10) Skilled Therapeutic Interventions:   Patient for skilled ST session focused on reassessment of patient's expressive and receptive language abilities via the Western Aphasia Battery. Patient completed majority of Auditory Word Recognition, consisting of pointing to objects, pictures, letters, numbers and pointing to things in room, body parts. She was 3/6 for objects, 4/6 for object pictures, 0/6 for shapes, 3/6 for letters, 2/6 for numbers, 0/6 for colors. After SLP cued her by demonstrating pointing gesture, she was then 6/6 accurate for pointing to/gesturing to things in room. During body part identification, she touched her stomach and then proceeded to vomit what appeared to be tube feeds (had just gotten bolus tube feeding prior to session). After vomiting twice, she said "ooof" and did moan a little. It was difficult to determine if she was upset/frustrated by what happened or how she was feeling. Patient missed 15 minutes ST due to needing to be cleaned up by NT. She continues to benefit from skilled SLP intervention to maximize cognitive-linguistic, speech and swallow function prior to discharge.   Pain Pain Assessment Pain Scale: Faces Faces Pain Scale: Hurts a little bit Pain Type: Acute pain Pain Location: Abdomen Pain Orientation: Medial Pain Descriptors / Indicators: Grimacing;Guarding Pain Onset: On-going Pain Intervention(s): Other (Comment) (RN already present in room) Multiple Pain Sites: No  Therapy/Group: Individual Therapy   Angela Nevin, MA, CCC-SLP Speech Therapy

## 2020-04-27 NOTE — Progress Notes (Signed)
Family in room at this time. Family and pt educated on peg tube, medication administration, and complications. Questions answered. No other concerns.  Family performed medication administration at this time.  Mylo Red, LPN

## 2020-04-27 NOTE — Progress Notes (Signed)
Occupational Therapy Session Note  Patient Details  Name: Deborah [name] MRN: [id number] Date of Birth: 1971-02-13  Today's Date: 04/27/2020 OT Individual Time: 1132-1202 and 1400-1445 OT Individual Time Calculation (min): 30 min and 45 min   Short Term Goals: Week 4:  OT Short Term Goal 1 (Week 4): STGs = LTGs d/t ELOS   Skilled Therapeutic Interventions/Progress Updates:    Pt greeted at time of session semireclined in bed already dressed, per NT he got her dressed this am which was helpful. Supine to sit Mod A to manage RLE and pt with some discomfort in abdomen from PEG. Squat pivot bed > w/c almost Min A but more Mod today with pt knowing to reach with LUE to transfer to L side without cues. Set up at sink level and therapist assisted with hair washing in prep for DC home and for comfort. Pt assisted with drying hair minimally. Shirt did get wet in the process and changed shirt with Mod A with hemidressing techniques. Pt up in chair with alarm on, call bell in reach. No clear c/o pain but pt did have abdominal discomfort, no # given, improved with rest break.  Session 2: Pt greeted at time of session sitting up in wheelchair finishing with PT session, family present for more family ed today. Extensive discussion regarding home set up for bathroom, location for bathing, and shower set up as they are planning renovations for a new walk in shower that is longer that can fit a bench and shower curtain as well as grab bars. Therapist demonstrated shower squat pivot transfer for son/pt's SO when pt progresses to be able to shower at home, recommended allowing [facility] therapy perform first. Family transported family via wheelchair to shower room and reviewed TTB/shower bench options, family and pt receptive. Transported back to room in same manner, pt needing to use the bathroom. Squat pivot w/c > toilet by son and assisted with clothing management while pt in static standing, pt able to perform hygiene.  Squat pivot toilet > w/c > bed Mod A all by son. Sit to supine Mod A d/t fatigue and pt positioned with RUE elevated, digits extended and family ed on importance of digit extension and contracture prevention. Alarm on ,call bell in reach. Nursing staff entering at end of session.    Therapy Documentation Precautions:  Precautions Precautions: Fall Precaution Comments: NG, dense R Hemi, trach, aphasic Restrictions Weight Bearing Restrictions: No     Therapy/Group: Individual Therapy  Erasmo Score 04/27/2020, 12:14 PM

## 2020-04-27 NOTE — Progress Notes (Signed)
Nutrition Brief Note  RD consulted to adjust TF regimen due to nausea and vomiting. Discussed with PA and RN. Per RN, pt did not tolerate 8:00 am bolus of 237 ml (1 carton) of Glucerna 1.5 cal formula via PEG. Pt vomited about 10 minutes after bolus was administered.  Pt was started on bolus tube feeds yesterday after PEG cleared for use and plan was to slowly titrate up to goal volume of 355 ml (1.5 cartons) of Glucerna 1.5 cal formula TID to be administered if pt eats </= 50% of meal.  Given pt was unable to tolerate a bolus of 1 carton of formula (less than goal volume), RD will transition pt back to nocturnal tube feeds. Pt has tolerated nocturnal tube feeds without issue in the past.  Will reorder previously tolerated nocturnal tube feeds: - Glucerna 1.5@ 65 ml/hrto run for 12 hours from 1800 to 0600 (total of712ml) via PEG  Nocturnal tube feeding regimen with ProSource 45 ml BID as ordered provides1250kcal,86grams of protein, and523ml of H2O(69% of kcal needs,91% of protein needs).  Continue free water flushes of 250 ml q 3 hours while awake as ordered by MD.   Deborah Reading, MS, RD, LDN Inpatient Clinical Dietitian Please see AMiON for contact information.

## 2020-04-27 NOTE — Progress Notes (Signed)
[facility] PHYSICAL MEDICINE & REHABILITATION PROGRESS NOTE   Subjective/Complaints:     Pt's cortrak is out- PEG is working.  Pt said no when asked if she was having pain- and did thumb down- so appears abd pain better.    Talked to son at length about Victoza and BGs and needs to keep BG 80-150- not ~ 200- that increases her risk of another stroke- which son was unaware of.  Also, will figure out if can get victoza restarted so can reduce Novolog/stop it so pt doesn't need as many injection of insulin.     ROS: limited due to aphasia   Objective:   IR GASTROSTOMY TUBE MOD SED  Result Date: 04/25/2020 CLINICAL DATA:  Cerebral infarction, dysphagia and need for percutaneous gastrostomy tube. EXAM: PERCUTANEOUS GASTROSTOMY TUBE PLACEMENT ANESTHESIA/SEDATION: 1.5 mg IV Versed; 50 mcg IV Fentanyl. Total Moderate Sedation Time 21 minutes. The patient's level of consciousness and physiologic status were continuously monitored during the procedure by Radiology nursing. CONTRAST:  50m OMNIPAQUE IOHEXOL 300 MG/ML  SOLN MEDICATIONS: 2 g IV Ancef. IV antibiotic was administered in an appropriate time interval prior to needle puncture of the skin. FLUOROSCOPY TIME:  1 minutes and 42 seconds.  24.0 mGy. PROCEDURE: The procedure, risks, benefits, and alternatives were explained to the patient's son. Questions regarding the procedure were encouraged and answered. The patient's son understands and consents to the procedure. A time-out was performed prior to initiating the procedure. A 5-French catheter was then advanced through the patient's mouth under fluoroscopy into the esophagus and to the level of the stomach. This catheter was used to insufflate the stomach with air under fluoroscopy. The abdominal wall was prepped with Betadine in a sterile fashion, and a sterile drape was applied covering the operative field. A sterile gown and sterile gloves were used for the procedure. Local anesthesia was  provided with 1% Lidocaine. A skin incision was made in the upper abdominal wall. Under fluoroscopy, an 18 gauge trocar needle was advanced into the stomach. Contrast injection was performed to confirm intraluminal position of the needle tip. A single T tack was then deployed in the lumen of the stomach. This was brought up to tension at the skin surface. Over a guidewire, a 9-French sheath was advanced into the lumen of the stomach. The wire was left in place as a safety wire. A loop snare device from a percutaneous gastrostomy kit was then advanced into the stomach. A floppy guide wire was advanced through the orogastric catheter under fluoroscopy in the stomach. The loop snare advanced through the percutaneous gastric access was used to snare the guide wire. This allowed withdrawal of the loop snare out of the patient's mouth by retraction of the orogastric catheter and wire. A 20-French bumper retention gastrostomy tube was looped around the snare device. It was then pulled back through the patient's mouth. The retention bumper was brought up to the anterior gastric wall. The T tack suture was cut at the skin. The exiting gastrostomy tube was cut to appropriate length and a feeding adapter applied. The catheter was injected with contrast material to confirm position and a fluoroscopic spot image saved. The tube was then flushed with saline. A dressing was applied over the gastrostomy exit site. COMPLICATIONS: None. FINDINGS: The stomach distended well with air allowing safe placement of the gastrostomy tube. After placement, the tip of the gastrostomy tube lies in the body of the stomach. IMPRESSION: Percutaneous gastrostomy with placement of a 20-French bumper retention  tube in the body of the stomach. This tube can be used for percutaneous feeds beginning in 24 hours after placement. Electronically Signed   By: Aletta Edouard M.D.   On: 04/25/2020 17:01   Recent Labs    04/25/20 0732  WBC 11.4*  HGB  12.4  HCT 38.0  PLT 395   Recent Labs    04/25/20 0732  NA 137  K 4.4  CL 100  CO2 23  GLUCOSE 166*  BUN 21*  CREATININE 0.91  CALCIUM 9.4    Intake/Output Summary (Last 24 hours) at 04/27/2020 0932 Last data filed at 04/27/2020 0700 Gross per 24 hour  Intake 225 ml  Output --  Net 225 ml        Physical Exam: Vital Signs Blood pressure 123/74, pulse 91, temperature 98.1 F (36.7 C), resp. rate 14, height 5' (1.524 m), weight 81.3 kg, SpO2 94 %. General: pt laying in bed- cortrak out- less sad/pained affect today, NAD HEENT: cortrak outl trach out Neck: Supple without JVD or lymphadenopathy Heart: RRR Chest: CTA B/L- no W/R/R- good air movement Abdomen: less TTP- ND, normoactive BS Psych: less sad affect- less pain-  Musc: No edema in extremities.  No tenderness in extremities. Neuro: pt answering questions with better responses- thumbs up/down matched yes/no better.  : Limited due to participation, but spontaneously moving left side, no movement noted on right side, no R side movement- moving L side well No abnl tone   Assessment/Plan: 1. Functional deficits secondary to L MCA stroke with aphasia, and R heimplegia  which require 3+ hours per day of interdisciplinary therapy in a comprehensive inpatient rehab setting.  Physiatrist is providing close team supervision and 24 hour management of active medical problems listed below.  Physiatrist and rehab team continue to assess barriers to discharge/monitor patient progress toward functional and medical goals  Care Tool:  Bathing    Body parts bathed by patient: Chest, Abdomen, Right upper leg, Left upper leg, Face, Front perineal area, Right lower leg, Left lower leg, Right arm, Buttocks   Body parts bathed by helper: Left arm, Buttocks     Bathing assist Assist Level: Moderate Assistance - Patient 50 - 74% (Min-Mod)     Upper Body Dressing/Undressing Upper body dressing   What is the patient wearing?:  Pull over shirt    Upper body assist Assist Level: Moderate Assistance - Patient 50 - 74%    Lower Body Dressing/Undressing Lower body dressing      What is the patient wearing?: Pants, Underwear/pull up     Lower body assist Assist for lower body dressing: Maximal Assistance - Patient 25 - 49%     Toileting Toileting Toileting Activity did not occur (Clothing management and hygiene only): N/A (no void or bm)  Toileting assist Assist for toileting: Maximal Assistance - Patient 25 - 49%     Transfers Chair/bed transfer  Transfers assist     Chair/bed transfer assist level: Moderate Assistance - Patient 50 - 74%     Locomotion Ambulation   Ambulation assist   Ambulation activity did not occur: Safety/medical concerns (due to patient fatigue/weakness)  Assist level: 2 helpers Assistive device: Walker-platform (& walking sling on maxisky) Max distance: 4   Walk 10 feet activity   Assist  Walk 10 feet activity did not occur: Safety/medical concerns (due to patient fatigue/weakness)  Assist level: 2 helpers Assistive device: Lite Gait   Walk 50 feet activity   Assist Walk 50 feet with 2  turns activity did not occur: Safety/medical concerns (due to patient fatigue/weakness)         Walk 150 feet activity   Assist Walk 150 feet activity did not occur: Safety/medical concerns (due to patient fatigue/weakness)         Walk 10 feet on uneven surface  activity   Assist Walk 10 feet on uneven surfaces activity did not occur: Safety/medical concerns (due to patient fatigue/weakness)         Wheelchair     Assist Will patient use wheelchair at discharge?: Yes Type of Wheelchair: Manual    Wheelchair assist level: Minimal Assistance - Patient > 75% Max wheelchair distance: 59'    Wheelchair 50 feet with 2 turns activity    Assist        Assist Level: Minimal Assistance - Patient > 75%   Wheelchair 150 feet activity      Assist      Assist Level: Moderate Assistance - Patient 50 - 74%   Blood pressure 123/74, pulse 91, temperature 98.1 F (36.7 C), resp. rate 14, height 5' (1.524 m), weight 81.3 kg, SpO2 94 %.  Medical Problem List and Plan: 1.  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  11/8- PEG this AM/today   11/9- got PEG- waiting to use- need to hear from Radiology 2.  Antithrombotics: -DVT/anticoagulation: Lovenox             -antiplatelet therapy: Plavix 75 mg daily 3. Pain Management: Tylenol as needed.   10/25- controlled- con't regimen  10/26- taped R shoulder to help with pain- will see how does- by OT  11/7: pain appears to be well controlled  11/10- pain better controlled with tramadol for PEG site pain 4. Mood: Provide emotional support  11/9- appears sad this AM- thinking sad about PEG             -antipsychotic agents: N/A 5. Neuropsych: This patient is not capable of making decisions on her own behalf. 6. Skin/Wound Care: Routine skin checks 7. Fluids/Electrolytes/Nutrition: Routine in and outs. 8.  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  Currently with a #4 cuffless trach as of 03/28/2020. Continue PMV as tolerated. Follow-up speech therapy. Tolerating room air.  9.  Post-stroke Dysphagia.    D1 nectars initially- now D1 thins  Continue tube feeds  Freewater flushes increased to 250 mL every 3 hours, has normal echo  10/25- BUN 24- slightly elevated-   10/28- D1 thins now-   10/30-31 eating around 25-50% meals   -continue TF  11/2- con't TFs- still a little dry- esp needs water- will call family about possible PEG< since cannot go home with NGT.   11/3- called family- got con's permission to go ahead with PEG- placed IR  consult (PA)  11/4- waiting to get PEG_ CT of abd/pelvis done  11/5- IT waiting for time off Plavix to do PEG- likely Monday- will monitor  11/7: Appreciate radiology discussion of PEG with patient and son- patient has been scheduled (date not noted)  11/8- getting PEG today- will make sure nutrition aware in AM  11/9- got PEG_ waiting to use- havent' heard from Radiology- when we do, will call nutrition.   11/10- restarted TFs per nutrition 10. Uncontrolled diabetes mellitus with hyperglycemia.  Hemoglobin A1c 10.7.  NovoLog 10 units every 4 hours, Lantus insulin 24 units twice daily.   CBG (last 3)  Recent Labs    04/26/20 2351 04/27/20 0211 04/27/20 0433  GLUCAP 175* 95 92   Slightly elevated on 10/24, likely to be component as well  11/4- BGs stable -con't regimen  11/5- BGs look better- changed to TID Novolog- Lantus BID- con't regimen  11/6: CBG elevated to 217 this morning. Increased Lantus to 26U.   11/8- NPO today- will con't regimen for now  11/9- wait to change since was NPO all day yesterday  11/10- BGs much better 90s- 170s will see if can start Victoza instead of Novolog.   11.  Urinary retention.  Urecholine 10 mg 3 times daily.    Flomax started on 10/23            PVRs previously with retention 12.  Hyperlipidemia. Lipitor 13.  Tobacco abuse. Counseled on appropriate 14. Leukocytosis             WBCs 10.8 on 10/18, labs ordered for tomorrow  10/25- WBC 11.2- will check U/A and Cx to make sure doesn't have UTI  10/26- no UTI per U/A results  10/28- per son and pt when asked, "she's always had a higher WBC"- might need outpt w/u but nothing to worry about for ID-wise, here.   11/8- lowest it's been in weeks at 11.4k 15. Acute blood loss anemia:   Hgb 11.2 on 10/18, labs ordered for tomorrow 16. Constipation:   Improved. Had BM 11/5  11/7: No BM yesterday. Scheduled senna HS 17. Hypotension:   11/4- BP controlled- con't regimen 18. Trach for previous resp  failure  10/30-31--no issues, capped   -dc tomorrow potentially per primary team  11/1- had lower sats 90-92%- might have been mucus plug that resp got out? CXR shows nothing specific- MAYBE small L pleural effusion.  No fever, no increase in WBC above baseline. Will NOT take out trach, like planned and will put cap back on and monitor  11/2- wait to remove trach- sats 93% RA- but used to be 96% and above.  11/3- sats 96% and above- no suctioning yesterday= if OK, will remove trach tomorrow, if still doing OK.  11/4- will see if can decannulate today- call resp.  11/5- decannulated   11/8- breathing well    19. Dispo  11/2- will call family about PEG- because not eating well.   11/3- d/w family- will get PEG.  11/9- got PEG- restarting Plavix.     LOS: 28 days A FACE TO FACE EVALUATION WAS PERFORMED  Misti Towle 04/27/2020, 9:32 AM

## 2020-04-28 ENCOUNTER — Inpatient Hospital Stay (HOSPITAL_COMMUNITY): Payer: Medicaid Other | Admitting: Speech Pathology

## 2020-04-28 ENCOUNTER — Inpatient Hospital Stay (HOSPITAL_COMMUNITY): Payer: Medicaid Other | Admitting: Occupational Therapy

## 2020-04-28 ENCOUNTER — Inpatient Hospital Stay (HOSPITAL_COMMUNITY): Payer: Medicaid Other | Admitting: Physical Therapy

## 2020-04-28 ENCOUNTER — Other Ambulatory Visit (HOSPITAL_COMMUNITY): Payer: Self-pay | Admitting: Physician Assistant

## 2020-04-28 LAB — GLUCOSE, CAPILLARY
Glucose-Capillary: 126 mg/dL — ABNORMAL HIGH (ref 70–99)
Glucose-Capillary: 135 mg/dL — ABNORMAL HIGH (ref 70–99)
Glucose-Capillary: 148 mg/dL — ABNORMAL HIGH (ref 70–99)
Glucose-Capillary: 161 mg/dL — ABNORMAL HIGH (ref 70–99)
Glucose-Capillary: 191 mg/dL — ABNORMAL HIGH (ref 70–99)
Glucose-Capillary: 248 mg/dL — ABNORMAL HIGH (ref 70–99)
Glucose-Capillary: 89 mg/dL (ref 70–99)

## 2020-04-28 MED ORDER — BETHANECHOL CHLORIDE 10 MG PO TABS: 10.0000 mg | ORAL_TABLET | Freq: Three times a day (TID) | ORAL | 0 refills | Status: DC

## 2020-04-28 MED ORDER — FLEET ENEMA 7-19 GM/118ML RE ENEM
1.0000 | ENEMA | Freq: Every day | RECTAL | Status: DC | PRN
  Administered 2020-04-28: 1 via RECTAL
  Filled 2020-04-28: qty 1

## 2020-04-28 MED ORDER — PROSOURCE TF PO LIQD
45.0000 mL | Freq: Three times a day (TID) | ORAL | Status: DC
  Administered 2020-04-28 – 2020-04-29 (×3): 45 mL
  Filled 2020-04-28 (×3): qty 45

## 2020-04-28 MED ORDER — GLUCERNA 1.5 CAL PO LIQD
118.0000 mL | Freq: Three times a day (TID) | ORAL | Status: DC
  Administered 2020-04-28 (×2): 118 mL
  Filled 2020-04-28 (×10): qty 237

## 2020-04-28 MED ORDER — FREE WATER: 150.0000 mL | Freq: Four times a day (QID) | Status: DC

## 2020-04-28 MED ORDER — GLUCERNA 1.5 CAL PO LIQD: 118.0000 mL | Freq: Three times a day (TID) | ORAL | Status: DC

## 2020-04-28 MED ORDER — PROSOURCE TF PO LIQD: 45.0000 mL | Freq: Three times a day (TID) | ORAL | Status: DC

## 2020-04-28 MED ORDER — AMANTADINE HCL 50 MG/5ML PO SOLN: 200.0000 mg | Freq: Every day | ORAL | 1 refills | Status: DC

## 2020-04-28 MED ORDER — FREE WATER
150.0000 mL | Freq: Four times a day (QID) | Status: DC
  Administered 2020-04-28 – 2020-04-29 (×4): 150 mL

## 2020-04-28 MED ORDER — TRAMADOL HCL 50 MG PO TABS: 50.0000 mg | ORAL_TABLET | Freq: Four times a day (QID) | ORAL | 0 refills | Status: DC | PRN

## 2020-04-28 MED ORDER — ATORVASTATIN CALCIUM 80 MG PO TABS: 80.0000 mg | ORAL_TABLET | Freq: Every day | ORAL | 0 refills | Status: DC

## 2020-04-28 MED ORDER — CLOPIDOGREL BISULFATE 75 MG PO TABS: 75.0000 mg | ORAL_TABLET | Freq: Every day | ORAL | 0 refills | Status: DC

## 2020-04-28 MED ORDER — INSULIN GLARGINE 100 UNIT/ML SOLOSTAR PEN: 28.0000 [IU] | PEN_INJECTOR | Freq: Two times a day (BID) | SUBCUTANEOUS | 11 refills | Status: AC

## 2020-04-28 MED FILL — traMADol HCL 50 MG TABS: 50 | 5 days supply | Qty: 20 | Fill #0

## 2020-04-28 MED FILL — CLOPIDOGREL 75 MG TABLET: 75 | 30 days supply | Qty: 30 | Fill #0

## 2020-04-28 MED FILL — PENTIPS 32G X 4 MM MISC: 32G X 4 MM | 25 days supply | Qty: 100 | Fill #0

## 2020-04-28 MED FILL — AMANTADINE 50 MG/5 ML SYRUP: 50 | 24 days supply | Qty: 473 | Fill #0

## 2020-04-28 MED FILL — BETHANECHOL 10 MG TABLET: 10 | 20 days supply | Qty: 60 | Fill #0

## 2020-04-28 MED FILL — ATORVASTATIN CALCIUM 80 MG: 80 | 30 days supply | Qty: 30 | Fill #0

## 2020-04-28 MED FILL — LANTUS SOLOSTAR 100 UNITS/M: 100 | 26 days supply | Qty: 15 | Fill #0

## 2020-04-28 NOTE — Progress Notes (Signed)
Occupational Therapy Discharge Summary  Patient Details  Name: Deborah [name] MRN: [id number] Date of Birth: 09-16-1970  Today's Date: 04/28/2020 OT Individual Time: 4010-2725 OT Individual Time Calculation (min): 60 min    Patient has met 8 of 12 long term goals due to improved activity tolerance, improved balance, postural control, ability to compensate for deficits, improved attention and improved awareness.  Patient to discharge at overall Mod Assist level.  Patient's care partner is independent to provide the necessary physical and cognitive assistance at discharge. The pt has shown significant improvement and is now consistently performing squat pivot and stand pivot transfers no AD to wheelchair and toilet with Mod A and sometimes Min A. Min A for sit to stands from her wheelchair for ADL tasks. Pt continues to require Mod-Max A for LB ADLs including bathing and dressing from sit to stand level and requires less assistance to maintain upright standing position and support on R side. RUE continues to be flaccid with trace movement noted in digits and a recent increase in tone. Family ed completed x3 dates with her significant other and son who are prepared to assist with ADLs at home, extensive discussion and training regarding ADLs and ROM for home.    Reasons goals not met: Pt continues to require Max A for LB dressing for assist to thread and don over R hip but pt is able to help with L side, Max A for toileting for clothing management tasks, did not exhibit emergent awareness but is instead more at intellectual level during ADLs, and requires extensive cues and physical assist to use RUE as a stabilizer.   Recommendation:  Patient will benefit from ongoing skilled OT services in home health setting to continue to advance functional skills in the area of BADL and Reduce care partner burden.  Equipment: drop arm BSC Note pt will be sponge bathing at home for now and provided hand out for  future DME needs for shower level bathing when appropria  Reasons for discharge: treatment goals met and discharge from hospital  Patient/family agrees with progress made and goals achieved: Yes   Skilled Interventions: Pt greeted at time of session reclined in bed resting and when asked if she needed to change brief pt stated "yes, bathroom." Supine to sit EOB Min A for RLE management, squat pivot to wheelchair Mod A with pt remembering to reach with LUE for armrest. Stand pivot wheelchair > toilet with use of grab bar Mod A, assist for doffing clothing for urgency, note that pt was not soiled and had continent episode on toilet for urine. Supervision for hygiene and cue to prevent full R lean on toilet. Donned new brief max A but pt able to assist in standing with R knee block (mild - pt weight bearing mostly) to use LUE to assist, help provided over R hip. Stand pivot toilet > wheelchair Mod A. Set up at sink level and declined bathing but did want to change shirt. UB dress Min A with extended time and verbal cues to attend to R side using hemitechnique and assist to thread RUE. LB dress Max A as pt has difficulty reaching feet to thread and assist needed to don over R hip same as brief. Transported to gym via wheelchair and provided with print out of future TTB options for family to have. PROM performed for RUE evolving into self ROM for the pt to perform with pt counting out loud to 10 at shoulder, elbow, forearm, wrist, digits. Once back  in room, pt adamant that she wanted to lay down and declining to sit up for dinner, Squat pivot back to bed Mod A, Min A for sit to supine with assist for RLE. Alarm on, call bell in reach, left with RN flushing PEG.   OT Discharge Precautions/Restrictions  Precautions Precautions: Fall;Other (comment) (R hemi) Precaution Comments: PEG, dense R Hemi, aphasic Restrictions Weight Bearing Restrictions: No Vital Signs Therapy Vitals Temp: 98.8 F (37.1 C) Temp  Source: Oral Pulse Rate: 82 Resp: 17 BP: 114/63 Patient Position (if appropriate): Lying Oxygen Therapy SpO2: 94 % O2 Device: Room Air Pain Pain Assessment Pain Scale: 0-10 Pain Score: 2  (did not provide number but was in mild discomfort with bending tasks at PEG site) ADL ADL Eating: Supervision/safety Where Assessed-Eating: Wheelchair Grooming: Minimal assistance Upper Body Bathing: Minimal assistance, Moderate cueing Where Assessed-Upper Body Bathing: Sitting at sink Lower Body Bathing: Moderate assistance Where Assessed-Lower Body Bathing: Sitting at sink, Standing at sink Upper Body Dressing: Minimal assistance Where Assessed-Upper Body Dressing: Sitting at sink Lower Body Dressing: Maximal assistance Where Assessed-Lower Body Dressing: Sitting at sink, Standing at sink Toileting: Maximal assistance Where Assessed-Toileting: Glass blower/designer: Moderate assistance Toilet Transfer Method: Stand pivot Vision Additional Comments: R inattention to R side during ADL and functional tasks, aphasia limiting further testing but pt has improved from eval Perception  Perception: Impaired Inattention/Neglect: Other (comment) (decreased attention to R side of body especially RUE) Praxis Praxis: Impaired Praxis Impairment Details: Motor planning;Initiation Cognition Overall Cognitive Status: Difficult to assess Arousal/Alertness: Awake/alert Orientation Level: Oriented to person Attention: Focused Focused Attention: Appears intact Memory: Impaired Awareness: Impaired Problem Solving: Impaired Behaviors: Impulsive Safety/Judgment: Impaired Sensation Sensation Light Touch: Impaired Detail Peripheral sensation comments: decreased sensation in RUE and RLE Proprioception: Impaired Detail Coordination Gross Motor Movements are Fluid and Coordinated: No Fine Motor Movements are Fluid and Coordinated: No Coordination and Movement Description: impaired due to significant R  hemiparesis and impaired trunk control Finger Nose Finger Test: unable on R UE Motor  Motor Motor: Hemiplegia;Abnormal postural alignment and control;Abnormal tone Motor - Skilled Clinical Observations: R hemiplegia, occassional R lean during functional tasks, increased tone in R UE/LE sinve eval Mobility  Bed Mobility Sitting - Scoot to Edge of Bed: Minimal Assistance - Patient > 75%  Trunk/Postural Assessment  Cervical Assessment Cervical Assessment: Exceptions to [facility] Thoracic Assessment Thoracic Assessment: Exceptions to [facility] Lumbar Assessment Lumbar Assessment: Exceptions to [facility] Postural Control Postural Control: Deficits on evaluation Trunk Control: Mild R lean during functional tasks, decreased trunk stability but improved from eval Righting Reactions: delayed Protective Responses: delayed  Balance Balance Balance Assessed: Yes Static Sitting Balance Static Sitting - Balance Support: Feet supported Static Sitting - Level of Assistance: 5: Stand by assistance Dynamic Sitting Balance Dynamic Sitting - Balance Support: Feet supported;During functional activity Dynamic Sitting - Level of Assistance: 5: Stand by assistance Sitting balance - Comments: Supervision/CGA for sitting balance during ADL tasks with occassional tactile cues for R lean Extremity/Trunk Assessment RUE Assessment RUE Assessment: Exceptions to [facility] Active Range of Motion (AROM) Comments: flaccid RUE, increased tone noted compared to eval, trace movement in digits General Strength Comments: 0/5 shoulder and elbow, 1/5 digits RUE Body System: Neuro Brunstrum levels for arm and hand: Arm;Hand Brunstrum level for arm: Stage I Presynergy Brunstrum level for hand: Stage II Synergy is developing LUE Assessment LUE Assessment: Within Functional Limits   Viona Gilmore 04/28/2020, 4:50 PM

## 2020-04-28 NOTE — Discharge Instructions (Signed)
Inpatient Rehab Discharge Instructions  Deborah [name] Discharge date and time: No discharge date for patient encounter.   Activities/Precautions/ Functional Status: Activity: activity as tolerated Diet: Dysphagia #2 thin liquids.  Glucerna 180 mL 3 times daily per tube.  Prosource 45 mL 3 times daily per tube.  Free water 150 mL 4 times daily per tube Wound Care: Routine skin checks Functional status:  ___ No restrictions     ___ Walk up steps independently ___ 24/7 supervision/assistance   ___ Walk up steps with assistance ___ Intermittent supervision/assistance  ___ Bathe/dress independently ___ Walk with walker     _x__ Bathe/dress with assistance ___ Walk Independently    ___ Shower independently ___ Walk with assistance    ___ Shower with assistance ___ No alcohol     ___ Return to work/school ________ COMMUNITY REFERRALS UPON DISCHARGE:    Home Health:   PT     OT     ST    RN                    Agency: [facility]  Phone: [phone]   Medical Equipment/Items Ordered: Wheelchair, Agricultural consultant, Bedside Commode                                                 Agency/Supplier: [employer]  Special Instructions: No driving smoking or alcohol   My questions have been answered and I understand these instructions. I will adhere to these goals and the provided educational materials after my discharge from the hospital.  Patient/Caregiver Signature _______________________________ Date __________  Clinician Signature _______________________________________ Date __________  Please bring this form and your medication list with you to all your follow-up doctor's appointments.

## 2020-04-28 NOTE — Progress Notes (Signed)
Speech Language Pathology Discharge Summary  Patient Details  Name: Deborah [name] MRN: [id number] Date of Birth: Dec 05, 1970  Today's Date: 04/28/2020 SLP Individual Time: 1250-1345 SLP Individual Time Calculation (min): 55 min   Skilled Therapeutic Interventions:  Skilled treatment session focused on cognitive and communication goals. SLP facilitated session by providing a 4-step picture sequencing task. Patient was overall Mod I for problem solving with task and required Max-total sentence completion and verbal cues to name functional items within the pictures. However, with a 6-step picture sequencing task, patient required Mod verbal cues for problem solving. Patient also named functional objects around her room with Min A sentence completion cues. Patient left upright in bed with alarm on and all needs within reach.   Patient has met 4 of 6 long term goals.  Patient to discharge at overall Min;Mod level.   Reasons goals not met: Patient requires overall Max-Total A for verbal expression of wants/needs and functional problem solving.   Clinical Impression/Discharge Summary: Patient has made functional gains and has met 4 of 6 LTGs this reporting period. Currently, patient is consuming Dys. 2 textures with thin liquids with minimal overt s/s of aspiration and Min verbal cues for use of swallowing compensatory strategies. Patient is utilizing a Data processing manager along with gestures to express basic wants/needs with overall Mod A cues and demonstrates overall improved auditory comprehension. Patient demonstrates increased overall ability to imitate at th basic CVC word level and is working towards maximizing verbal initiation and overall expression. Patient and family education is complete and patient will discharge home with 24 hour supervision from family. Patient would benefit from f/u home health SLP services to maximize her swallowing and cognitive functioning as well as her functional  communication in order to reduce caregiver burden.   Care Partner:  Caregiver Able to Provide Assistance: Yes  Type of Caregiver Assistance: Physical;Cognitive  Recommendation:  Home Health SLP;24 hour supervision/assistance  Rationale for SLP Follow Up: Maximize functional communication;Reduce caregiver burden;Maximize cognitive function and independence;Maximize swallowing safety   Equipment: N/A   Reasons for discharge: Discharged from hospital   Patient/Family Agrees with Progress Made and Goals Achieved: Yes    [place], [place] 04/28/2020, 6:31 AM

## 2020-04-28 NOTE — Progress Notes (Signed)
Physical Therapy Session Note  Patient Details  Name: Deborah [name] MRN: [id number] Date of Birth: 1970/09/08  Today's Date: 04/28/2020 PT Individual Time: 1010-1105 PT Individual Time Calculation (min): 55 min   Short Term Goals: Week 4:  PT Short Term Goal 1 (Week 4): STG = LTG due to ELOS  Skilled Therapeutic Interventions/Progress Updates:    Pt received supine in bed and agreeable to therapy session. Supine>sitting R EOB, using bed features, with light mod assist for trunk upright and rotating hips due to pain a PEG site requiring increased assist. Pt noted to be incontinent of bladder with linens soaked. L stand pivot EOB>w/c with min assist for balance and pivoting hips. Transported in/out bathroom in w/c. L stand pivot w/c>toilet using grab bar support with min assist for balance and pivoting hips. Pt able to maintain standing balance using L UE support on grab bar with CGA for steadying during total assist LB clothing management. Able to further continently void bladder on toilet - performed seated peri-care with supervision for trunk control/sitting balance. Standing as descried above during total assist peri-care for cleanliness. R stand pivot toilet>w/c with increased min assist for balance due to impaired ability to step with R LE during the transfer. Seated hand hygiene at sink with total hand-over-hand facilitation of R UE incorporation into task - educated pt on importance of good hand hygiene and keeping nails trimmed on R due to flexor tone with fingers starting to curl up into a fist. Pt requesting for assistance back to bed between sessions. R stand pivot w/c>EOB with min assist for balance and pivoting hips - again has more difficulty transferring towards R compared to L. Sit>supine with min assist for R LE management onto bed. Performed supine bridging 2x10 reps with facilitation for R LE alignment and positioning with cuing for increased hip clearance and glute activation. Pt  left supine in bed with HOB elevated, R UE therapeutically positioned on pillows with wash clothe roll in hand due to tone, and bed alarm on.  Therapy Documentation Precautions:  Precautions Precautions: Fall, Other (comment) (R hemi) Precaution Comments: PEG, dense R Hemi, aphasic Restrictions Weight Bearing Restrictions: No  Pain:   Using the Faces Pain Scale, due to aphasia, demonstrates around a level 4 "hurts a little more" from PEG site specifically during bed mobility but otherwise no indications of pain.   Therapy/Group: Individual Therapy  Ginny Forth , PT, DPT, CSRS  04/28/2020, 10:24 PM

## 2020-04-28 NOTE — Progress Notes (Signed)
Pt rec'd Fleets enema at beginning of shift with good results.

## 2020-04-28 NOTE — Progress Notes (Signed)
[place] PHYSICAL MEDICINE & REHABILITATION PROGRESS NOTE   Subjective/Complaints:      Pt didn't tolerate boluses yesterday- vomited with meds/bolus-  Went back to night time TFs- but can't send home with pump.   Appears pt having a little abd pain, per her responses/mainly yes but 1 no.    ROS: limited due to aphasia   Objective:   No results found. No results for input(s): WBC, HGB, HCT, PLT in the last 72 hours. No results for input(s): NA, K, CL, CO2, GLUCOSE, BUN, CREATININE, CALCIUM in the last 72 hours.  Intake/Output Summary (Last 24 hours) at 04/28/2020 0915 Last data filed at 04/28/2020 0751 Gross per 24 hour  Intake 145 ml  Output --  Net 145 ml        Physical Exam: Vital Signs Blood pressure 117/67, pulse 93, temperature 98.2 F (36.8 C), resp. rate 17, height 5' (1.524 m), weight 88.1 kg, SpO2 96 %. General: pt laying in bed- cortrak still out; brighter affect, more firm in responses- yes/no, NAD HEENT: cortrak outl trach out Neck: Supple without JVD or lymphadenopathy Heart: RRR Chest: CTA B/L- no W/R/R- good air movement-  Abdomen:less TTP- PEG looks ok- ND, hypoactive BS Psych: more firm in yes/no responses  Musc: No edema in extremities.  No tenderness in extremities. Neuro: pt answering questions with better responses- thumbs up/down matched yes/no better.  : Limited due to participation, but spontaneously moving left side, no movement noted on right side, no R side movement- moving L side well No abnl tone   Assessment/Plan: 1. Functional deficits secondary to L MCA stroke with aphasia, and R heimplegia  which require 3+ hours per day of interdisciplinary therapy in a comprehensive inpatient rehab setting.  Physiatrist is providing close team supervision and 24 hour management of active medical problems listed below.  Physiatrist and rehab team continue to assess barriers to discharge/monitor patient progress toward functional and medical  goals  Care Tool:  Bathing    Body parts bathed by patient: Chest, Abdomen, Right upper leg, Left upper leg, Face, Front perineal area, Right lower leg, Left lower leg, Right arm, Buttocks   Body parts bathed by helper: Left arm, Buttocks     Bathing assist Assist Level: Moderate Assistance - Patient 50 - 74% (Min-Mod)     Upper Body Dressing/Undressing Upper body dressing   What is the patient wearing?: Pull over shirt    Upper body assist Assist Level: Moderate Assistance - Patient 50 - 74%    Lower Body Dressing/Undressing Lower body dressing      What is the patient wearing?: Pants, Underwear/pull up     Lower body assist Assist for lower body dressing: Maximal Assistance - Patient 25 - 49%     Toileting Toileting Toileting Activity did not occur (Clothing management and hygiene only): N/A (no void or bm)  Toileting assist Assist for toileting: Maximal Assistance - Patient 25 - 49%     Transfers Chair/bed transfer  Transfers assist     Chair/bed transfer assist level: Moderate Assistance - Patient 50 - 74%     Locomotion Ambulation   Ambulation assist   Ambulation activity did not occur: Safety/medical concerns (due to patient fatigue/weakness)  Assist level: 2 helpers Assistive device: Walker-platform (& walking sling on maxisky) Max distance: 4   Walk 10 feet activity   Assist  Walk 10 feet activity did not occur: Safety/medical concerns (due to patient fatigue/weakness)  Assist level: 2 helpers Assistive device: Lite Gait  Walk 50 feet activity   Assist Walk 50 feet with 2 turns activity did not occur: Safety/medical concerns (due to patient fatigue/weakness)         Walk 150 feet activity   Assist Walk 150 feet activity did not occur: Safety/medical concerns (due to patient fatigue/weakness)         Walk 10 feet on uneven surface  activity   Assist Walk 10 feet on uneven surfaces activity did not occur: Safety/medical  concerns (due to patient fatigue/weakness)         Wheelchair     Assist Will patient use wheelchair at discharge?: Yes Type of Wheelchair: Manual    Wheelchair assist level: Minimal Assistance - Patient > 75% Max wheelchair distance: 48'    Wheelchair 50 feet with 2 turns activity    Assist        Assist Level: Minimal Assistance - Patient > 75%   Wheelchair 150 feet activity     Assist      Assist Level: Moderate Assistance - Patient 50 - 74%   Blood pressure 117/67, pulse 93, temperature 98.2 F (36.8 C), resp. rate 17, height 5' (1.524 m), weight 88.1 kg, SpO2 96 %.  Medical Problem List and Plan: 1.  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  11/8- PEG this AM/today   11/9- got PEG- waiting to use- need to hear from Radiology 2.  Antithrombotics: -DVT/anticoagulation: Lovenox             -antiplatelet therapy: Plavix 75 mg daily 3. Pain Management: Tylenol as needed.   10/25- controlled- con't regimen  10/26- taped R shoulder to help with pain- will see how does- by OT  11/7: pain appears to be well controlled  11/10- pain better controlled with tramadol for PEG site pain  11/11- pain controlled with tramadol- will send home on low dose.  4. Mood: Provide emotional support  11/9- appears sad this AM- thinking sad about PEG             -antipsychotic agents: N/A 5. Neuropsych: This patient is not capable of making decisions on her own behalf. 6. Skin/Wound Care: Routine skin checks 7. Fluids/Electrolytes/Nutrition: Routine in and outs. 8.  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  Currently with a #4 cuffless trach as of 03/28/2020. Continue PMV as tolerated. Follow-up speech therapy. Tolerating room  air.  9.  Post-stroke Dysphagia.    D1 nectars initially- now D1 thins  Continue tube feeds  Freewater flushes increased to 250 mL every 3 hours, has normal echo  10/25- BUN 24- slightly elevated-   10/28- D1 thins now-   10/30-31 eating around 25-50% meals   -continue TF  11/2- con't TFs- still a little dry- esp needs water- will call family about possible PEG< since cannot go home with NGT.   11/3- called family- got con's permission to go ahead with PEG- placed IR consult (PA)  11/4- waiting to get PEG_ CT of abd/pelvis done  11/5- IT waiting for time off Plavix to do PEG- likely Monday- will monitor  11/7: Appreciate radiology discussion of PEG with patient and son- patient has been scheduled (date not noted)  11/8- getting PEG today- will make sure nutrition aware in AM  11/9- got PEG_ waiting  to use- havent' heard from Radiology- when we do, will call nutrition.   11/10- restarted TFs per nutrition  11/11- not tolerating boluses- still require night time TFs- will call nutrition and se eif they have any ideas- supposed to d/c tomorrow 10. Uncontrolled diabetes mellitus with hyperglycemia.  Hemoglobin A1c 10.7.  NovoLog 10 units every 4 hours, Lantus insulin 24 units twice daily.   CBG (last 3)  Recent Labs    04/28/20 0018 04/28/20 0409 04/28/20 0830  GLUCAP 135* 161* 248*   Slightly elevated on 10/24, likely to be component as well  11/4- BGs stable -con't regimen  11/5- BGs look better- changed to TID Novolog- Lantus BID- con't regimen  11/6: CBG elevated to 217 this morning. Increased Lantus to 26U.   11/8- NPO today- will con't regimen for now  11/9- wait to change since was NPO all day yesterday  11/10- BGs much better 90s- 170s will see if can start Victoza instead of Novolog.    11.  Urinary retention.  Urecholine 10 mg 3 times daily.    Flomax started on 10/23            PVRs previously with retention 12.  Hyperlipidemia. Lipitor 13.  Tobacco abuse. Counseled on  appropriate 14. Leukocytosis             WBCs 10.8 on 10/18, labs ordered for tomorrow  10/25- WBC 11.2- will check U/A and Cx to make sure doesn't have UTI  10/26- no UTI per U/A results  10/28- per son and pt when asked, "she's always had a higher WBC"- might need outpt w/u but nothing to worry about for ID-wise, here.   11/8- lowest it's been in weeks at 11.4k 15. Acute blood loss anemia:   Hgb 11.2 on 10/18, labs ordered for tomorrow 16. Constipation:   Improved. Had BM 11/5  11/7: No BM yesterday. Scheduled senna HS 17. Hypotension:   11/4- BP controlled- con't regimen 18. Trach for previous resp failure  10/30-31--no issues, capped   -dc tomorrow potentially per primary team  11/1- had lower sats 90-92%- might have been mucus plug that resp got out? CXR shows nothing specific- MAYBE small L pleural effusion.  No fever, no increase in WBC above baseline. Will NOT take out trach, like planned and will put cap back on and monitor  11/2- wait to remove trach- sats 93% RA- but used to be 96% and above.  11/3- sats 96% and above- no suctioning yesterday= if OK, will remove trach tomorrow, if still doing OK.  11/4- will see if can decannulate today- call resp.  11/5- decannulated   11/8- breathing well  11/11- breathing well.     19. Dispo  11/2- will call family about PEG- because not eating well.   11/3- d/w family- will get PEG.  11/9- got PEG- restarting Plavix.    11/11- cannot get pump for pt- so discussed with nutrition changing to boluses to smaller, more frequent- nutrition can call me if any other questions. Did verify this with SW.   LOS: 29 days A FACE TO FACE EVALUATION WAS PERFORMED  Marquavius Scaife 04/28/2020, 9:15 AM

## 2020-04-28 NOTE — Progress Notes (Signed)
Physical Therapy Discharge Summary  Patient Details  Name: Deborah [name] MRN: [id number] Date of Birth: [birth year]   Patient has met 7 of 10 long term goals due to improved activity tolerance, improved balance, increased strength and ability to compensate for deficits.  Patient to discharge at a wheelchair level [place].   Patient's care partner is independent to provide the necessary physical and cognitive assistance at discharge.  Reasons goals not met: Patient with ongoing issues after PEG placement with pain and with limited activity tolerance at times as well as focus of last week of her stay was on caregiver education and not on ambulation or progressing with standing dynamic balance.   Recommendation:  Patient will benefit from ongoing skilled PT services in home health setting to continue to advance safe functional mobility, address ongoing impairments in attention, R LE strength, balance, safety/defict awareness, and minimize fall risk.  Equipment: wheelchair, RW, half lap tray  Reasons for discharge: treatment goals partly met and discharge from hospital  Patient/family agrees with progress made and goals achieved: Yes  PT Discharge Precautions/Restrictions Precautions Precautions: Fall;Other (comment) Precaution Comments: PEG, dense R Hemi, aphasic Restrictions Weight Bearing Restrictions: No Pain Pain Assessment Pain [name]: Faces Faces Pain [name]: Hurts little more Pain Type: Acute pain;Surgical pain Pain Location: Abdomen Pain Orientation: Medial Pain Descriptors / Indicators: Grimacing;Guarding;Moaning Pain Onset: On-going Pain Intervention(s): Medication (See eMAR);Rest;Relaxation;Emotional support;Repositioned Perception  Perception Perception: Impaired Inattention/Neglect: Does not attend to right visual field;Does not attend to right side of body Praxis Praxis: Impaired Praxis Impairment Details: Motor planning;Initiation  Cognition Overall  Cognitive Status: Difficult to assess (due to aphasia) Arousal/Alertness: Awake/alert Orientation Level: Oriented to person (able to say last name "Deborah [name]") Attention: Focused Focused Attention: Appears intact Memory: Impaired Awareness: Impaired Safety/Judgment: Impaired Sensation Sensation Light Touch: Impaired Detail Peripheral sensation comments: unable to feel light touch on R LE Light Touch Impaired Details: Absent RLE Hot/Cold: Not tested Proprioception: Impaired Detail Proprioception Impaired Details: Absent RLE Stereognosis: Not tested Coordination Gross Motor Movements are Fluid and Coordinated: No Coordination and Movement Description: impaired due to significant R hemiparesis and impaired trunk control Heel Shin Test: unable on R LE due to paresis Motor  Motor Motor: Hemiplegia;Abnormal postural alignment and control;Abnormal tone Motor - Discharge Observations: still with significant R hemiparesis, impaired trunk control, and R UE and LE hypotonia  Mobility Bed Mobility Bed Mobility: Sit to Supine;Supine to Sit Supine to Sit: Minimal Assistance - Patient > 75% Sit to Supine: Minimal Assistance - Patient > 75% Transfers Transfers: Sit to Stand;Stand to Sit;Stand Pivot Transfers Sit to Stand: Minimal Assistance - Patient > 75% Stand to Sit: Minimal Assistance - Patient > 75% Stand Pivot Transfers: Minimal Assistance - Patient > 75% Stand Pivot Transfer Details: Manual facilitation for weight shifting;Manual facilitation for placement;Verbal cues for technique;Verbal cues for sequencing;Tactile cues for sequencing;Tactile cues for weight shifting;Tactile cues for placement;Tactile cues for initiation;Tactile cues for posture;Tactile cues for weight beaing Transfer (Assistive device): None Locomotion Gait not tested on discharge due to limited time and pt toileting.  At last session she ambulated was about 6' with mod to max A using wall railing.  When using RW  needed +2 A.  Family educated not to attempt ambulation at home.     Trunk/Postural Assessment  Cervical Assessment Cervical Assessment: Exceptions to [facility] (forward head) Thoracic Assessment Thoracic Assessment: Exceptions to [facility] (increased thoracic rounding) Lumbar Assessment Lumbar Assessment: Exceptions to [facility] (posterior pelvic tilt) Postural Control Postural Control: Deficits on evaluation Trunk  Control: Mild R lean during functional tasks, decreased trunk stability but improved from eval Righting Reactions: delayed Protective Responses: delayed and insufficient Postural Limitations: decreased  Balance  static sitting balance with S, dynamic min A, standing static balance with UE support and min A, dynamic with mod A.  Extremity Assessment      RLE Assessment RLE Assessment: Exceptions to [facility] General Strength Comments: pt with poor motor planning to perform movements sitting EOB but demos some activation during functional mobility RLE Strength Right Hip Flexion: 2/5 Right Hip ABduction: 2-/5 Right Hip ADduction: 2-/5 Right Knee Flexion: 2-/5 Right Knee Extension: 2+/5 Right Ankle Dorsiflexion: 0/5 Right Ankle Plantar Flexion: 0/5 RLE Tone RLE Tone: Hypotonic;Moderate LLE Assessment LLE Assessment: Within Functional Limits Active Range of Motion (AROM) Comments: Deborah [name]    Deborah [name] , PT, DPT, CSRS Cyndi Rick Duff, PT 04/29/2020   04/28/2020, 8:00 AM

## 2020-04-28 NOTE — Progress Notes (Signed)
Spoke with nurse caring for the patient. No family members were in the room with the patient. States that the patient needs help at home and cannot communicate verbally. Son wanted a easier insulin regimen to follow at home.  In consideration of insulin dosing at home, noted that patient will be on tube feedings TID & HS at home if tolerating tube feeding while in the hospital.  CBGs have been between 100-248 mg/dl in the last couple of days. Tube feedings started back today as 1/2 doses to see if patient can tolerate them.  The ideal situation would be to give Lantus BID and Novolog sliding scale TID & HS with tube feedings each time patient tolerates them. Recommend discontinuing Victoza since this medication is a GLP-1 and is dependent on patient eating. It can cause increased risk of low blood sugar if not eating consistently.   Possible regimen could be Lantus 28 units BID until the patient returns to PCP a week later after discharge.Novolog could be added after PCP visit. Patient might benefit from a Freestyle Libre continuous glucose monitor that would read the blood sugars and alarm if blood sugars low.  Blood sugars will need to checked at least 4 times per day and physician will need to be informed if blood sugars are greater than 250 mg/dl or less than 80 mg/dl consistently.   Will continue to monitor blood sugars while in the hospital and will wait to see what plans are for discharge.   Smith Mince RN BSN CDE Diabetes Coordinator Pager: [phone]  8am-5pm

## 2020-04-28 NOTE — Progress Notes (Addendum)
Brief Nutrition Note  Received page from MD regarding pt's TF regimen. Unable to obtain a pump for pt at discharge. Will change back to bolus TF regimen and order smaller, more frequent boluses.  Re-initiate bolus tube feeding regimen via PEG: - If pt eats </= 50% of meal, provide bolus of half of a carton (118 ml) of Glucerna 1.5 cal formula via PEG - Always provide HS bolus of half of a carton (118 ml) of Glucerna 1.5 cal formula  Total: 2 cartons of Glucerna 1.5 cal formula daily  - Provide ProSource TF 45 ml TID via PEG - Free water flushes of 150 ml QID  Full tube feeding regimen provides 831 kcal, 72 grams of protein, and 360 ml of H2O (meets 46% of kcal and 76% of protein needs).  Total free water with flushes: 960 ml  Discussed plan with RN.   Earma Reading, MS, RD, LDN Inpatient Clinical Dietitian Please see [facility] for contact information.

## 2020-04-29 LAB — GLUCOSE, CAPILLARY
Glucose-Capillary: 123 mg/dL — ABNORMAL HIGH (ref 70–99)
Glucose-Capillary: 165 mg/dL — ABNORMAL HIGH (ref 70–99)
Glucose-Capillary: 131 mg/dL — ABNORMAL HIGH (ref 70–99)

## 2020-04-29 MED ORDER — GLUCERNA 1.5 CAL PO LIQD: 118.0000 mL | Freq: Three times a day (TID) | ORAL | 1 refills | Status: DC

## 2020-04-29 NOTE — Progress Notes (Signed)
Patient ID: Deborah [name], female   DOB: 06-08-1971, 49 y.o.   MRN: [id number]   Orders faxed to [facility] and [facility]. Significant other [name]). Paid for RW on floor, awaiting delivery.   [place], [place] [phone]

## 2020-04-29 NOTE — Progress Notes (Signed)
Patient discharged home with son. LPN educated son on use of glucerna. Pharmacy brought all meds to patient. All belongings and equipment sent with patient. Escorted out by NT

## 2020-04-29 NOTE — Progress Notes (Signed)
Patient ID: Deborah [name], female   DOB: 10-Mar-1971, 50 y.o.   MRN: [id number]   SW informed feeding ordered from pharmacy for patient to discharge home with. Patient set up with [facility] for SN, PT, OT, [facility]. Start day of care will begin Sunday 05/01/2020. SW getting patient potentially set up with [facility].  [place], [place] [phone]

## 2020-04-29 NOTE — Progress Notes (Signed)
Patient ID: Deborah [name], female   DOB: 31-Dec-1970, 49 y.o.   MRN: [id number]   Patient set up with [facility].  [place], [place]  [phone]

## 2020-04-29 NOTE — Progress Notes (Signed)
Inpatient Rehabilitation Care Coordinator  Discharge Note  The overall goal for the admission was met for:   Discharge location: Yes, Home  Length of Stay: Yes, 30 Days  Discharge activity level: Yes  Home/community participation: Yes  Services provided included: MD, RD, PT, OT, SLP, RN, CM, TR, Pharmacy, Neuropsych and SW  Financial Services: Private Insurance: [place] [employer] PrePaid Health Plan  Follow-up services arranged: Home Health: [facility]   Comments (or additional information): BSC, RW, WC SN, PT, OT, SPH  Patient/Family verbalized understanding of follow-up arrangements: Yes  Individual responsible for coordination of the follow-up plan: [name] (Son), [phone]  Confirmed correct DME delivered: Dyanne Iha 04/29/2020    Dyanne Iha

## 2020-04-29 NOTE — Plan of Care (Signed)
  Problem: RH Balance Goal: LTG Patient will maintain dynamic sitting balance (PT) Description: LTG:  Patient will maintain dynamic sitting balance with assistance during mobility activities (PT) Outcome: Completed/Met Goal: LTG Patient will maintain dynamic standing balance (PT) Description: LTG:  Patient will maintain dynamic standing balance with assistance during mobility activities (PT) Outcome: Not Met (add Reason) Note: Mod A for balance at times due to decreased R side awareness   Problem: Sit to Stand Goal: LTG:  Patient will perform sit to stand with assistance level (PT) Description: LTG:  Patient will perform sit to stand with assistance level (PT) Outcome: Completed/Met   Problem: RH Bed Mobility Goal: LTG Patient will perform bed mobility with assist (PT) Description: LTG: Patient will perform bed mobility with assistance, with/without cues (PT). Outcome: Not Met (add Reason) Note: Pain residual from PEG placement   Problem: RH Bed to Chair Transfers Goal: LTG Patient will perform bed/chair transfers w/assist (PT) Description: LTG: Patient will perform bed to chair transfers with assistance (PT). Outcome: Completed/Met   Problem: RH Car Transfers Goal: LTG Patient will perform car transfers with assist (PT) Description: LTG: Patient will perform car transfers with assistance (PT). Outcome: Completed/Met   Problem: RH Furniture Transfers Goal: LTG Patient will perform furniture transfers w/assist (OT/PT) Description: LTG: Patient will perform furniture transfers  with assistance (OT/PT). Outcome: Completed/Met   Problem: RH Ambulation Goal: LTG Patient will ambulate in controlled environment (PT) Description: LTG: Patient will ambulate in a controlled environment, # of feet with assistance (PT). Outcome: Not Met (add Reason) Note: Decreased R side awareness, focus on caregiver education last week of her stay   Problem: RH Wheelchair Mobility Goal: LTG Patient  will propel w/c in home environment (PT) Description: LTG: Patient will propel wheelchair in home environment, # of feet with assistance (PT). Outcome: Completed/Met  Magda Kiel, PT

## 2020-04-29 NOTE — Progress Notes (Signed)
[place] PHYSICAL MEDICINE & REHABILITATION PROGRESS NOTE   Subjective/Complaints:     Pt reports- yes/no- only- but winced when did abd exam.   Will send home on tramadol- 7 days since PEG placement.    ROS: limited due to aphasia   Objective:   No results found. No results for input(s): WBC, HGB, HCT, PLT in the last 72 hours. No results for input(s): NA, K, CL, CO2, GLUCOSE, BUN, CREATININE, CALCIUM in the last 72 hours.  Intake/Output Summary (Last 24 hours) at 04/29/2020 0850 Last data filed at 04/28/2020 1900 Gross per 24 hour  Intake 60 ml  Output --  Net 60 ml        Physical Exam: Vital Signs Blood pressure (!) 113/49, pulse 84, temperature 97.9 F (36.6 C), temperature source Oral, resp. rate 17, height 5' (1.524 m), weight 88.4 kg, SpO2 94 %. General: pt laying in bed- saying yes/no only; flatter affect today, NAD HEENT: cortrak outl trach out Neck: Supple without JVD or lymphadenopathy Heart: RRR Chest: CTA B/L- no W/R/R- good air movement  Abdomen: still somewhat TTP- winced-ND, hypoactive BS Psych: more firm in yes/no responses- esp when asked if had abd pain  Musc: No edema in extremities.  No tenderness in extremities. Neuro: pt answering questions with better responses- thumbs up/down matched yes/no better.  : Limited due to participation, but spontaneously moving left side, no movement noted on right side, no R side movement- moving L side well No abnl tone   Assessment/Plan: 1. Functional deficits secondary to L MCA stroke with aphasia, and R heimplegia  which require 3+ hours per day of interdisciplinary therapy in a comprehensive inpatient rehab setting.  Physiatrist is providing close team supervision and 24 hour management of active medical problems listed below.  Physiatrist and rehab team continue to assess barriers to discharge/monitor patient progress toward functional and medical goals  Care Tool:  Bathing    Body parts bathed  by patient: Chest, Abdomen, Right upper leg, Left upper leg, Face, Front perineal area, Right lower leg, Left lower leg, Right arm, Buttocks   Body parts bathed by helper: Left arm, Buttocks     Bathing assist Assist Level: Moderate Assistance - Patient 50 - 74%     Upper Body Dressing/Undressing Upper body dressing   What is the patient wearing?: Pull over shirt    Upper body assist Assist Level: Moderate Assistance - Patient 50 - 74%    Lower Body Dressing/Undressing Lower body dressing      What is the patient wearing?: Pants, Underwear/pull up     Lower body assist Assist for lower body dressing: Maximal Assistance - Patient 25 - 49%     Toileting Toileting Toileting Activity did not occur (Clothing management and hygiene only): N/A (no void or bm)  Toileting assist Assist for toileting: Maximal Assistance - Patient 25 - 49%     Transfers Chair/bed transfer  Transfers assist     Chair/bed transfer assist level: Minimal Assistance - Patient > 75%     Locomotion Ambulation   Ambulation assist   Ambulation activity did not occur: Safety/medical concerns (due to patient fatigue/weakness)  Assist level: 2 helpers Assistive device: Walker-platform (& walking sling on maxisky) Max distance: 4   Walk 10 feet activity   Assist  Walk 10 feet activity did not occur: Safety/medical concerns (due to patient fatigue/weakness)  Assist level: 2 helpers Assistive device: Lite Gait   Walk 50 feet activity   Assist Walk 50 feet  with 2 turns activity did not occur: Safety/medical concerns (due to patient fatigue/weakness)         Walk 150 feet activity   Assist Walk 150 feet activity did not occur: Safety/medical concerns (due to patient fatigue/weakness)         Walk 10 feet on uneven surface  activity   Assist Walk 10 feet on uneven surfaces activity did not occur: Safety/medical concerns (due to patient fatigue/weakness)          Wheelchair     Assist Will patient use wheelchair at discharge?: Yes Type of Wheelchair: Manual    Wheelchair assist level: Minimal Assistance - Patient > 75% Max wheelchair distance: 32'    Wheelchair 50 feet with 2 turns activity    Assist        Assist Level: Minimal Assistance - Patient > 75%   Wheelchair 150 feet activity     Assist      Assist Level: Moderate Assistance - Patient 50 - 74%   Blood pressure (!) 113/49, pulse 84, temperature 97.9 F (36.6 C), temperature source Oral, resp. rate 17, height 5' (1.524 m), weight 88.4 kg, SpO2 94 %.  Medical Problem List and Plan: 1.  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  11/8- PEG this AM/today   11/9- got PEG- waiting to use- need to hear from Radiology  11/12- using- had to decrease bolus size due ot nausea 2.  Antithrombotics: -DVT/anticoagulation: Lovenox             -antiplatelet therapy: Plavix 75 mg daily 3. Pain Management: Tylenol as needed.   10/25- controlled- con't regimen  10/26- taped R shoulder to help with pain- will see how does- by OT  11/7: pain appears to be well controlled  11/10- pain better controlled with tramadol for PEG site pain  11/11- pain controlled with tramadol- will send home on low dose.  11/12- send home on tramadol prn  4. Mood: Provide emotional support  11/9- appears sad this AM- thinking sad about PEG             -antipsychotic agents: N/A 5. Neuropsych: This patient is not capable of making decisions on her own behalf. 6. Skin/Wound Care: Routine skin checks 7. Fluids/Electrolytes/Nutrition: Routine in and outs. 8.  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  Currently with a #4 cuffless trach as of 03/28/2020.  Continue PMV as tolerated. Follow-up speech therapy. Tolerating room air.  9.  Post-stroke Dysphagia.    D1 nectars initially- now D1 thins  Continue tube feeds  Freewater flushes increased to 250 mL every 3 hours, has normal echo  10/25- BUN 24- slightly elevated-   10/28- D1 thins now-   10/30-31 eating around 25-50% meals   -continue TF  11/2- con't TFs- still a little dry- esp needs water- will call family about possible PEG< since cannot go home with NGT.   11/3- called family- got con's permission to go ahead with PEG- placed IR consult (PA)  11/4- waiting to get PEG_ CT of abd/pelvis done  11/5- IT waiting for time off Plavix to do PEG- likely Monday- will monitor  11/7: Appreciate radiology discussion of PEG with patient and son- patient has been scheduled (date not noted)  11/8- getting PEG today-  will make sure nutrition aware in AM  11/9- got PEG_ waiting to use- havent' heard from Radiology- when we do, will call nutrition.   11/10- restarted TFs per nutrition  11/11- not tolerating boluses- still require night time TFs- will call nutrition and se eif they have any ideas- supposed to d/c tomorrow  11/12- d/w nutrition - changed orders 10. Uncontrolled diabetes mellitus with hyperglycemia.  Hemoglobin A1c 10.7.  NovoLog 10 units every 4 hours, Lantus insulin 24 units twice daily.   CBG (last 3)  Recent Labs    04/28/20 2356 04/29/20 0320 04/29/20 0716  GLUCAP 126* 123* 165*   Slightly elevated on 10/24, likely to be component as well  11/4- BGs stable -con't regimen  11/5- BGs look better- changed to TID Novolog- Lantus BID- con't regimen  11/6: CBG elevated to 217 this morning. Increased Lantus to 26U.   11/8- NPO today- will con't regimen for now  11/9- wait to change since was NPO all day yesterday  11/10- BGs much better 90s- 170s will see if can start Victoza instead of Novolog.  11/12- Had DM coordinator- see pt- and changed orders- no Victoza at home per  coordinator    11.  Urinary retention.  Urecholine 10 mg 3 times daily.    Flomax started on 10/23            PVRs previously with retention 12.  Hyperlipidemia. Lipitor 13.  Tobacco abuse. Counseled on appropriate 14. Leukocytosis             WBCs 10.8 on 10/18, labs ordered for tomorrow  10/25- WBC 11.2- will check U/A and Cx to make sure doesn't have UTI  10/26- no UTI per U/A results  10/28- per son and pt when asked, "she's always had a higher WBC"- might need outpt w/u but nothing to worry about for ID-wise, here.   11/8- lowest it's been in weeks at 11.4k 15. Acute blood loss anemia:   Hgb 11.2 on 10/18, labs ordered for tomorrow 16. Constipation:   Improved. Had BM 11/5  11/7: No BM yesterday. Scheduled senna HS 17. Hypotension:   11/4- BP controlled- con't regimen 18. Trach for previous resp failure  10/30-31--no issues, capped   -dc tomorrow potentially per primary team  11/1- had lower sats 90-92%- might have been mucus plug that resp got out? CXR shows nothing specific- MAYBE small L pleural effusion.  No fever, no increase in WBC above baseline. Will NOT take out trach, like planned and will put cap back on and monitor  11/2- wait to remove trach- sats 93% RA- but used to be 96% and above.  11/3- sats 96% and above- no suctioning yesterday= if OK, will remove trach tomorrow, if still doing OK.  11/4- will see if can decannulate today- call resp.  11/5- decannulated   11/8- breathing well  11/11- breathing well.   11/12- healed over- no bandage needed anymore    19. Dispo  11/2- will call family about PEG- because not eating well.   11/3- d/w family- will get PEG.  11/9- got PEG- restarting Plavix.    11/11- cannot get pump for pt- so discussed with nutrition changing to boluses to smaller, more frequent- nutrition can call me if any other questions. Did verify this with SW.   11/12- d/c today  LOS: 30 days A FACE TO FACE EVALUATION WAS PERFORMED  Kannen Moxey 04/29/2020, 8:50 AM

## 2020-04-29 NOTE — Plan of Care (Signed)
  Problem: Consults Goal: RH STROKE PATIENT EDUCATION Description: See Patient Education module for education specifics  Outcome: Completed/Met Goal: Diabetes Guidelines if Diabetic/Glucose > 140 Description: If diabetic or lab glucose is > 140 mg/dl - Initiate Diabetes/Hyperglycemia Guidelines & Document Interventions  Outcome: Completed/Met   Problem: RH BOWEL ELIMINATION Goal: RH STG MANAGE BOWEL WITH ASSISTANCE Description: STG Manage Bowel with min to mod Assistance. Outcome: Completed/Met Goal: RH STG MANAGE BOWEL W/MEDICATION W/ASSISTANCE Description: STG Manage Bowel with Medication with min to mod Assistance. Outcome: Completed/Met   Problem: RH BLADDER ELIMINATION Goal: RH STG MANAGE BLADDER WITH ASSISTANCE Description: STG Manage Bladder With min to mod Assistance Outcome: Completed/Met Goal: RH STG MANAGE BLADDER WITH MEDICATION WITH ASSISTANCE Description: STG Manage Bladder With Medication With  min to mod Assistance. Outcome: Completed/Met   Problem: RH SKIN INTEGRITY Goal: RH STG MAINTAIN SKIN INTEGRITY WITH ASSISTANCE Description: STG Maintain Skin Integrity With min to mod Assistance. Outcome: Completed/Met Goal: RH STG ABLE TO PERFORM INCISION/WOUND CARE W/ASSISTANCE Description: STG Able To Perform Incision/Wound Care With  min to mod Assistance. Outcome: Completed/Met   Problem: RH SAFETY Goal: RH STG ADHERE TO SAFETY PRECAUTIONS W/ASSISTANCE/DEVICE Description: STG Adhere to Safety Precautions With min to mod Assistance/Device. Outcome: Completed/Met   Problem: RH COGNITION-NURSING Goal: RH STG ANTICIPATES NEEDS/CALLS FOR ASSIST W/ASSIST/CUES Description: STG Anticipates Needs/Calls for Assist With min to mod Assistance and Cues. Outcome: Completed/Met   Problem: RH PAIN MANAGEMENT Goal: RH STG PAIN MANAGED AT OR BELOW PT'S PAIN GOAL Description: <3 on a 0-10 pain scale. Outcome: Completed/Met   Problem: RH KNOWLEDGE DEFICIT Goal: RH STG INCREASE  KNOWLEDGE OF DIABETES Description: Patient will demonstrate knowledge of diabetes management, medication management, blood sugar control, and follow up care with the MD with min to mod assist from CIR staff. Outcome: Completed/Met Goal: RH STG INCREASE KNOWLEDGE OF STROKE PROPHYLAXIS Description: Patient will be able to demonstrate knowledge on medications used and taken to aid in prevention of future strokes with min to mod assist from CIR staff. Outcome: Completed/Met

## 2020-05-03 ENCOUNTER — Telehealth: Payer: Self-pay

## 2020-05-03 ENCOUNTER — Telehealth: Payer: Self-pay | Admitting: *Deleted

## 2020-05-03 NOTE — Telephone Encounter (Signed)
Transitional Care call--James-son of patient    1. Are you/is patient experiencing any problems since coming home? No Are there any questions regarding any aspect of care? Concerned that nurses had lack of communication with each other 2. Are there any questions regarding medications administration/dosing? No Are meds being taken as prescribed? Yes Patient should review meds with caller to confirm 3. Have there been any falls? No 4. Has Home Health been to the house and/or have they contacted you? Yes, for evals If not, have you tried to contact them? Can we help you contact them? 5. Are bowels and bladder emptying properly? Yes Are there any unexpected incontinence issues? No If applicable, is patient following bowel/bladder programs? 6. Any fevers, problems with breathing, unexpected pain? No 7. Are there any skin problems or new areas of breakdown? No 8. Has the patient/family member arranged specialty MD follow up (ie cardiology/neurology/renal/surgical/etc)? Yes Can we help arrange? 9. Does the patient need any other services or support that we can help arrange? No 10. Are caregivers following through as expected in assisting the patient? Yes 11. Has the patient quit smoking, drinking alcohol, or using drugs as recommended? Yes  Appointment time 10:40 am, arrive time 10:20 am on 05/09/2020 with Riley Lam then Dr. Berline Chough [address] [phone]

## 2020-05-03 NOTE — Telephone Encounter (Signed)
Received calls from Donita RN [facility] for SN  2wk1 1wk4, and PT [facility] called for 2wk1, 1wk1, 2wk1, 1wk1.  Approvals have been given.

## 2020-05-06 ENCOUNTER — Telehealth: Payer: Self-pay | Admitting: *Deleted

## 2020-05-06 NOTE — Telephone Encounter (Signed)
[facility] called for POC 1wk2 2wk3 1 wk3.  Approval given.

## 2020-05-09 ENCOUNTER — Telehealth: Payer: Self-pay | Admitting: *Deleted

## 2020-05-09 ENCOUNTER — Encounter: Payer: Medicaid Other | Admitting: Registered Nurse

## 2020-05-09 NOTE — Telephone Encounter (Signed)
[facility] OT called for POC 1qowk3, 1wk4.  Approval given.

## 2020-05-20 ENCOUNTER — Encounter: Payer: Self-pay | Admitting: Registered Nurse

## 2020-05-20 ENCOUNTER — Other Ambulatory Visit: Payer: Self-pay

## 2020-05-20 ENCOUNTER — Encounter: Payer: Medicaid Other | Attending: Registered Nurse | Admitting: Registered Nurse

## 2020-05-20 VITALS — BP 132/81 | HR 73 | Temp 98.3°F | Ht 60.0 in

## 2020-05-20 DIAGNOSIS — G8191 Hemiplegia, unspecified affecting right dominant side: Secondary | ICD-10-CM | POA: Diagnosis not present

## 2020-05-20 DIAGNOSIS — E785 Hyperlipidemia, unspecified: Secondary | ICD-10-CM | POA: Insufficient documentation

## 2020-05-20 DIAGNOSIS — I63512 Cerebral infarction due to unspecified occlusion or stenosis of left middle cerebral artery: Secondary | ICD-10-CM | POA: Insufficient documentation

## 2020-05-20 NOTE — Patient Instructions (Signed)
Dr. Levon Hedger  [address]  [place], [place] [address] [phone]

## 2020-05-20 NOTE — Progress Notes (Signed)
Subjective:    Patient ID: Deborah [name], female    DOB: 14-Jan-1971, 49 y.o.   MRN: [id number]  HPI: Deborah [name] is a 49 y.o. female who is here for Transitional care visit for follow up of her Left middle cerebral artery stroke, Right Hemiparesis and dyslipidemia. Deborah [name] presented to [facility] on 09/21/201 via EMS with acute right sided weakness and aphasia. She was found to have a left MCA Stroke and was transferred to [facility] for thrombectomy. Interventional Neuroradiologist and Neurosurgery was consulted.  CT Head WO Contrast:  IMPRESSION: Acute left MCA territory infarct without hemorrhagic conversion or progression from CT earlier today.  CT Cerebral Perfusion:  IMPRESSION: 1. 63 cc core infarct in the left cerebral hemisphere with good general agreement between the CT perfusion and preceding noncontrast CT. 2. 32 cc of estimated penumbra elsewhere in the left MCA territory.  CT Head WO Contrast:  IMPRESSION: Significantly increased acute ischemic infarction changes within the left MCA vascular territory as compared to the head CT performed earlier the same day at 7:57 a.m. The left MCA infarct is now large. Increased mass effect and partial effacement of the left lateral ventricle now with 4 mm rightward midline shift.  Small foci of parenchymal hemorrhage within the left basal ganglia do not appear significantly changed from the head CT performed earlier the same day at 10:26 a.m. However, small to moderate volume hemorrhage and/or contrast within the left perisylvian region and more superiorly along the left cerebral convexity has slightly increased since this prior study.  Per Dr Pearlean Brownie Discharge Summary:  Given her poor ASPECTS score, CT perfusion was obtained prior to thrombectomy to confirm that there was tissue at risk. Given that there was some tissue at risk and her young age, decision was made to proceed with thrombectomy.  Unfortunately,though revascularization was initially achieved, there was rapid reocclusion. Given the significant size of her hypodensity as well as subarachnoid hemorrhage during the procedure, decision was made not to proceed with stenting as the risks of further intracranial hemorrhage were felt to outweigh the benefit of reperfusing the relatively small amount of additional tissue at risk. Stroke: L MCA infarct s/p IR w/ post procedure hemorrhage, infarct secondary to large vessel disease source  Deborah [name] was admitted to Inpatient Rehabilitation on 03/30/2020, she was discharged home on 04/29/2020. She is receiving Home Health Therapy with [facility]. When asked Deborah [name] if she had any pain she pointed to her right arm. She was able to tell provider her last name otherwise she's noted with Language of Confusion. Her Dannial Monarch in room and reports she is eating about 50% of her meals, she's eating smaller portions 5-6 times a day he reports. He asked when her Peg-tube could be removed, this provider spoke with Dr Berline Chough she stated " 8 weeks from insertion date". He also wanted to know if Deborah [name] could  take her amantadine po, this was also discussed with Dr Berline Chough. Call was placed to [facility], no answer.  Deborah [name] arrived in wheelchair.    This provider called Mr. [name] on 05/24/2020 regarding the above questions, no answer and voicemail not active. Deborah [name] arrived in wheelchair.   Pain Inventory Average Pain 1 Pain Right Now 1 My pain is intermittent, sharp, tingling and aching  LOCATION OF PAIN  Right leg, right arm  BOWEL Number of stools per week: 4-5 Oral laxative use No  Type of laxative none Enema or suppository use No  History of colostomy No  Incontinent No   BLADDER Normal In and out cath, frequency No Able to self cath No  Bladder incontinence No  Frequent urination No  Leakage with coughing No  Difficulty starting stream No    Incomplete bladder emptying No    Mobility how many minutes can you walk? Not walking without  2 people ability to climb steps?  no do you drive?  no use a wheelchair Do you have any goals in this area?  yes  Function I need assistance with the following:  dressing, bathing, toileting, meal prep, household duties and shopping Do you have any goals in this area?  yes  Neuro/Psych weakness trouble walking spasms confusion  Prior Studies New Patient  Physicians involved in your care New Patient   Family History  Problem Relation Age of Onset  . Heart defect Mother        Leaky Valve  . Heart Problems Father        Enlarged heart   . Heart defect Maternal Grandmother        Leaky Valve   . Stroke Paternal Grandfather    Social History   Socioeconomic History  . Marital status: Married    Spouse name: Not on file  . Number of children: Not on file  . Years of education: Not on file  . Highest education level: Not on file  Occupational History  . Not on file  Tobacco Use  . Smoking status: Current Every Day Smoker    Packs/day: 0.50    Types: Cigarettes  . Smokeless tobacco: Never Used  Vaping Use  . Vaping Use: Never used  Substance and Sexual Activity  . Alcohol use: Not Currently  . Drug use: Not Currently  . Sexual activity: Yes  Other Topics Concern  . Not on file  Social History Narrative  . Not on file   Social Determinants of Health   Financial Resource Strain:   . Difficulty of Paying Living Expenses: Not on file  Food Insecurity:   . Worried About Programme researcher, broadcasting/film/video in the Last Year: Not on file  . Ran Out of Food in the Last Year: Not on file  Transportation Needs:   . Lack of Transportation (Medical): Not on file  . Lack of Transportation (Non-Medical): Not on file  Physical Activity:   . Days of Exercise per Week: Not on file  . Minutes of Exercise per Session: Not on file  Stress:   . Feeling of Stress : Not on file  Social  Connections:   . Frequency of Communication with Friends and Family: Not on file  . Frequency of Social Gatherings with Friends and Family: Not on file  . Attends Religious Services: Not on file  . Active Member of Clubs or Organizations: Not on file  . Attends Banker Meetings: Not on file  . Marital Status: Not on file   Past Surgical History:  Procedure Laterality Date  . IR CT HEAD LTD  03/08/2020  . IR GASTROSTOMY TUBE MOD SED  04/25/2020  . IR PERCUTANEOUS ART THROMBECTOMY/INFUSION INTRACRANIAL INC DIAG ANGIO  03/08/2020  . RADIOLOGY WITH ANESTHESIA N/A 03/08/2020   Procedure: IR WITH ANESTHESIA;  Surgeon: Julieanne Cotton, MD;  Location: [facility] OR;  Service: Radiology;  Laterality: N/A;   Past Medical History:  Diagnosis Date  . DM (diabetes mellitus), type 2 (HCC)   . Hyperlipidemia   . Hypersomnia   . Sprain of right ankle,  initial encounter   . Stroke (HCC)   . Syncope    BP 132/81   Pulse 73   Temp 98.3 F (36.8 C)   Ht 5' (1.524 m)   SpO2 94%   BMI 38.06 kg/m   Opioid Risk Score:   Fall Risk Score:  `1  Depression screen PHQ 2/9  Depression screen PHQ 2/9 05/20/2020  Decreased Interest 0  Down, Depressed, Hopeless 0  PHQ - 2 Score 0  Altered sleeping 0  Tired, decreased energy 1  Change in appetite 0  Feeling bad or failure about yourself  1  Trouble concentrating 0  Moving slowly or fidgety/restless 0  Suicidal thoughts 0  PHQ-9 Score 2   Review of Systems  HENT: Positive for trouble swallowing.   Musculoskeletal: Positive for gait problem.  Neurological: Positive for dizziness, weakness and headaches.  Psychiatric/Behavioral: Positive for confusion.  All other systems reviewed and are negative.      Objective:   Physical Exam Vitals and nursing note reviewed.  Constitutional:      Appearance: Normal appearance.  Cardiovascular:     Rate and Rhythm: Normal rate and regular rhythm.     Pulses: Normal pulses.     Heart sounds:  Normal heart sounds.  Pulmonary:     Effort: Pulmonary effort is normal.     Breath sounds: Normal breath sounds.  Musculoskeletal:     Cervical back: Normal range of motion and neck supple.     Comments: Normal Muscle Bulk and Muscle Testing Reveals:  Upper Extremities: Right Upper Extremity: Paralysis Left Upper Extremity: Full ROM and Muscle Strength 5/5  Lower Extremities: Right: Decreased ROM and Muscle Strength 4/5 Left Full ROM and Muscle Strength 5/5 Arrived in Wheelchair    Skin:    General: Skin is warm and dry.  Neurological:     Mental Status: She is alert.  Psychiatric:        Mood and Affect: Mood normal.        Behavior: Behavior normal.           Assessment & Plan:  1. Left middle cerebral artery stroke/ Right Hemiparesis: Continue  Current medication regimen. Continue Home Health Therapy with [facility]. She has a HFU with [facility] on 05/26/2020.  2. Dyslipidemia: Continue current medication regimen. Continue to monitor.   F/U with Dr Berline Chough in 4- 6 weeks

## 2020-05-26 ENCOUNTER — Inpatient Hospital Stay: Payer: Medicaid Other | Admitting: Adult Health

## 2020-05-31 NOTE — Progress Notes (Deleted)
[facility] [address] [place]. [place] [address] [phone]       HOSPITAL FOLLOW UP NOTE  [name] Date of Birth:  08-30-1970 Medical Record Number:  [id number]   Reason for Referral:  hospital stroke follow up    SUBJECTIVE:   CHIEF COMPLAINT:  No chief complaint on file.   HPI:   [name] a 49 y.o.femalewith history of difficulty to control DB, HLD, syncopewho presented on 03/08/2020 to [facility] with R hemiparesis, facial droop, left gaze, and global aphasia w/ glucose 400swhere she was found to have L M1 occlusionand transferred to [facility] for IR.  Personally reviewed hospitalization pertinent progress notes, lab work and imaging with summary provided.  Initially evaluated by Dr. Roda Shutters with stroke work-up revealing L MCA infarct s/p IR for L M1 occlusion with TICI 2C revascularization w/ reocclusion d/t severe underlying stenosis and postprocedure hemorrhage, infarct secondary to large vessel disease source.  Rescue stent not recommended due to increased risk of ICH.  Serial CT heads showed continued increase mass-effect and effacement with midline shift eventually stabilized on CT head 9/26 with midline shift measuring 9 mm.  Neurosurgery consulted without intervention indicated.  EEG negative for seizures.  Recommend consideration of further cardioembolic work-up patient if further neuro improvement.  Hypercoiled work-up negative except mildly elevated antithrombin III and homocystine level.  Recommended continuation of Plavix for secondary stroke prevention with history of aspirin allergy.  Acute respiratory failure secondary to stroke with trach placement 9/26. Hx of HLD on atorvastatin 20 mg daily PTA with LDL 174 and recommended increase to 80 mg daily.  Uncontrolled DM with A1c 10.7 on glimepiride, Lantus and liraglutide PTA.  Current tobacco use with smoking cessation counseling provided.  Other  stroke risk factors include UDS positive for benzos and barbiturates (of note, completed post IR procedure), obesity, family history of stroke and migraines.  Other active problems include chronic pain, anxiety, hypokalemia, hyponatremia, anemia and thrombocytopenia.  Residual stroke deficits of dysphagia NPO except meds, nonverbal, left gaze preference, not blinking to visual threat on right, right facial droop, right hemiplegia and left hemiparesis.  Per therapy recommendations, discharge to CIR for ongoing therapy needs on 03/30/2020.  Stroke: L MCA infarct s/p IR w/ post procedure hemorrhage, infarct secondary to large vessel disease source  CT head [facility]) L MCA infarct w/ ASPECTS 6  CTA head &neck [facility]) L M2 cutoff  CT head L MCA territory infarct.ASPECTS 6.   CT perfusion 63 core L brain same as CT. 32 penumbra. 1.5 mismatch ratio.  Cerebral angio / IR - L M1 occlusion s/p TICI2c revascularizationw/ reocclusion d/t severe underlying stenosis. Post IR hemorrhage.  CT head 9/21 1818 increased L MCA ischemic infarct w/ mass effect and partial effacement L lateral ventrilc w/62mm midline shift.Small foci L BG HT same w/ increased L perisylvian and L cerebral convexity hemorrhage.   CT head 9/22 0151 evolution large L MCA infarct w/ slightly worse effacement and mass effect w/ 55mm midline shift. Scattered foci hemorrhage in infarct same w/ contrast clearing. L M1 and proximal L MCA branches w/ persistent hyperdensity.  CT head 9/22 0827 L MCA infarct and hemorrhages same. Increase in mass effect and effacement now 87mm midline shift.L M1 and proximal L MCA braches w/ persistent hyperdensity.   MRI 9/22Large L MCA infarct w/ moderate infarct and SAH. Edema, 86mm midline shift.  MRA 9/22L MCA 1 occlusion  CT head 9/24 L MCA infarct  w/ mild increase in edema, 31mm midline shift   CT repeat 9/26 -Stable extent of left MCA territory infarct with small  volume bloodproducts. Midline shift continues to measure 9 mm.  2D EchoEF 60-65%. No source of embolus  Recent Zio patch Normal w/ infrequent PACs, PVCs 01/07/2020  May consider further cardioembolic work up as on OP if further neuro improvement  LDL174   HgbA1c10.7   B12/TSH/RPR/ANA normal.   Hypercoagulable work up negative- Antithrombin III mildly elevated - 122, homocysteine mildly elevated - 22.1   VTE prophylaxis - Lovenox 40 mg sq daily   clopidogrel 75 mg dailyprior to admission, now on plavix for stroke prevention (allergic to ASA).   Therapy recommendations: CIR  Disposition: CIR  Today, 06/01/2020, [name] is being seen via MyChart virtual visit for hospital follow-up.  She was discharged home with family from CIR on 04/29/2020 after a 30 day stay.  During CIR admission, underwent decannulization of trach tube on 11/5 and PEG tube placement on 11/8 for decreased nutritional intake and ongoing dysphagia.  Currently, reports residual ***.  [facility].  Use of PEG tube ***.  Denies new or worsening stroke/TIA symptoms.  Remains on Plavix without bleeding or bruising.  Remains on atorvastatin 80 mg daily without myalgias.  Blood pressure ***.       ROS:   14 system review of systems performed and negative with exception of ***  PMH:  Past Medical History:  Diagnosis Date  . DM (diabetes mellitus), type 2 (HCC)   . Hyperlipidemia   . Hypersomnia   . Sprain of right ankle, initial encounter   . Stroke (HCC)   . Syncope     PSH:  Past Surgical History:  Procedure Laterality Date  . IR CT HEAD LTD  03/08/2020  . IR GASTROSTOMY TUBE MOD SED  04/25/2020  . IR PERCUTANEOUS ART THROMBECTOMY/INFUSION INTRACRANIAL INC DIAG ANGIO  03/08/2020  . RADIOLOGY WITH ANESTHESIA N/A 03/08/2020   Procedure: IR WITH ANESTHESIA;  Surgeon: Julieanne Cotton, MD;  Location: [facility] OR;  Service: Radiology;  Laterality: N/A;     Social History:  Social History   Socioeconomic History  . Marital status: Married    Spouse name: Not on file  . Number of children: Not on file  . Years of education: Not on file  . Highest education level: Not on file  Occupational History  . Not on file  Tobacco Use  . Smoking status: Current Every Day Smoker    Packs/day: 0.50    Types: Cigarettes  . Smokeless tobacco: Never Used  Vaping Use  . Vaping Use: Never used  Substance and Sexual Activity  . Alcohol use: Not Currently  . Drug use: Not Currently  . Sexual activity: Yes  Other Topics Concern  . Not on file  Social History Narrative  . Not on file   Social Determinants of Health   Financial Resource Strain: Not on file  Food Insecurity: Not on file  Transportation Needs: Not on file  Physical Activity: Not on file  Stress: Not on file  Social Connections: Not on file  Intimate Partner Violence: Not on file    Family History:  Family History  Problem Relation Age of Onset  . Heart defect Mother        Leaky Valve  . Heart Problems Father        Enlarged heart   . Heart defect Maternal Grandmother  Leaky Valve   . Stroke Paternal Grandfather     Medications:   Current Outpatient Medications on File Prior to Visit  Medication Sig Dispense Refill  . acetaminophen (TYLENOL) 325 MG tablet Take 2 tablets (650 mg total) by mouth every 4 (four) hours as needed for mild pain (or temp > 37.5 C (99.5 F)).    Marland Kitchen. amantadine (SYMMETREL) 50 MG/5ML solution Place 20 mLs (200 mg total) into feeding tube daily. 473 mL 1  . atorvastatin (LIPITOR) 80 MG tablet Place 1 tablet (80 mg total) into feeding tube daily. 30 tablet 0  . bethanechol (URECHOLINE) 10 MG tablet Place 1 tablet (10 mg total) into feeding tube 3 (three) times daily. 60 tablet 0  . clopidogrel (PLAVIX) 75 MG tablet Place 1 tablet (75 mg total) into feeding tube daily. 30 tablet 0  . docusate (COLACE) 50 MG/5ML liquid Place 10 mLs (100 mg  total) into feeding tube 2 (two) times daily. 100 mL 0  . insulin glargine (LANTUS) 100 UNIT/ML Solostar Pen Inject 28 Units into the skin 2 (two) times daily. 15 mL 11  . Multiple Vitamin (MULTIVITAMIN WITH MINERALS) TABS tablet Place 1 tablet into feeding tube daily.    . Nutritional Supplements (FEEDING SUPPLEMENT, GLUCERNA 1.5 CAL,) LIQD Place 118 mLs into feeding tube 4 (four) times daily -  with meals and at bedtime. 1000 mL 1  . Nutritional Supplements (FEEDING SUPPLEMENT, PROSOURCE TF,) liquid Place 45 mLs into feeding tube 3 (three) times daily.    . polyethylene glycol (MIRALAX / GLYCOLAX) 17 g packet Place 17 g into feeding tube daily. 14 each 0  . traMADol (ULTRAM) 50 MG tablet Place 1 tablet (50 mg total) into feeding tube every 6 (six) hours as needed for severe pain. 20 tablet 0  . Water For Irrigation, Sterile (FREE WATER) SOLN Place 150 mLs into feeding tube 4 (four) times daily.     No current facility-administered medications on file prior to visit.    Allergies:   Allergies  Allergen Reactions  . Aspirin Shortness Of Breath  . Mushroom Extract Complex Swelling      OBJECTIVE:  Physical Exam  There were no vitals filed for this visit. There is no height or weight on file to calculate BMI. No exam data present  [facility]  Decreased Interest 0  Down, Depressed, Hopeless 0  PHQ - 2 Score 0  Altered sleeping 0  Tired, decreased energy 1  Change in appetite 0  Feeling bad or failure about yourself  1  Trouble concentrating 0  Moving slowly or fidgety/restless 0  Suicidal thoughts 0  PHQ-9 Score 2     General: well developed, well nourished, seated, in no evident distress Head: head normocephalic and atraumatic.   Neck: supple with no carotid or supraclavicular bruits Cardiovascular: regular rate and rhythm, no murmurs Musculoskeletal: no deformity Skin:  no rash/petichiae Vascular:  Normal pulses all extremities   Neurologic  Exam Mental Status: Awake and fully alert. Oriented to place and time. Recent and remote memory intact. Attention span, concentration and fund of knowledge appropriate. Mood and affect appropriate.  Cranial Nerves: Fundoscopic exam reveals sharp disc margins. Pupils equal, briskly reactive to light. Extraocular movements full without nystagmus. Visual fields full to confrontation. Hearing intact. Facial sensation intact. Face, tongue, palate moves normally and symmetrically.  Motor: Normal bulk and tone. Normal strength in all tested extremity muscles. Sensory.: intact to touch , pinprick , position and vibratory sensation.  Coordination: Rapid alternating movements normal in all extremities. Finger-to-nose and heel-to-shin performed accurately bilaterally. Gait and Station: Arises from chair without difficulty. Stance is normal. Gait demonstrates normal stride length and balance Reflexes: 1+ and symmetric. Toes downgoing.     NIHSS  *** Modified Rankin  *** CHA2DS2-VASc *** HAS-BLED ***     ASSESSMENT: Deborah [name] is a 49 y.o. year old female presented with right hemiparesis, facial droop, left gaze preference and global aphasia with hyperglycemia on 03/08/2020 with stroke work-up revealing left MCA infarct with left M1 occlusion s/p IR with TICI 2C revascularization w/ reocclusion d/t severe underlying stenosis and post procedure SAH with cerebral edema and midline shift, infarct secondary to large vessel disease source. Vascular risk factors include uncontrolled (difficulty to control) DM, uncontrolled HLD, tobacco use, obesity and migraines.  Hospital course complicated by acute respiratory failure secondary to stroke s/p trach placement 9/26, cerebral edema and leukocytosis.     PLAN:  1. L MCA stroke : Residual deficit: ***. Continue {anticoagulants:31417}  and ***  for secondary stroke prevention. Close PCP follow up for aggressive stroke risk factor management  2. Post  procedure SAH: 3.  4. HLD: LDL goal <70. Recent LDL 174.  Increase atorvastatin dosage to 80 mg daily during stroke admission 5. DMII: A1c goal<7.0. Recent A1c 10.7. hx of difficulty to control DM.  F/u with PCP    Follow up in *** or call earlier if needed   I spent *** minutes of face-to-face and non-face-to-face time with patient.  This included previsit chart review, lab review, study review, order entry, electronic health record documentation, patient education regarding recent stroke, residual deficits, importance of managing stroke risk factors and answered all questions to patient satisfaction     Ihor Austin, [facility]  [facility] [address] [place], [place] [address]  Phone [phone] Fax [phone] Note: This document was prepared with digital dictation and possible smart phrase technology. Any transcriptional errors that result from this process are unintentional.

## 2020-06-01 ENCOUNTER — Telehealth: Payer: Medicaid Other | Admitting: Adult Health

## 2020-06-02 ENCOUNTER — Telehealth: Payer: Self-pay

## 2020-06-02 NOTE — Telephone Encounter (Signed)
Demetrio Lapping OT with [facility] called:  Deborah [name] will need a right wrist and hand splint. Also an AFO for her right leg.   [name]'s appointment here is on 07/09/2019.   Please advise.   Call back phone number is [phone].

## 2020-06-16 ENCOUNTER — Telehealth: Payer: Self-pay | Admitting: *Deleted

## 2020-06-16 NOTE — Telephone Encounter (Signed)
Theora Gianotti ST [facility] called to report that Mrs Vanorman will miss a ST visit this week due to scheduling conflict with her appointments.  FYI only.

## 2020-06-22 ENCOUNTER — Telehealth: Payer: Self-pay

## 2020-06-22 NOTE — Telephone Encounter (Signed)
Discharge notes reviewed per protocol. OT granted once a week for one week. PT evaluation  once a week for one week.  Demetrio Lapping OT call back ph [phone]).

## 2020-06-23 ENCOUNTER — Other Ambulatory Visit: Payer: Self-pay

## 2020-06-23 NOTE — Patient Outreach (Signed)
[facility]) Care Management  06/23/2020  Deborah [name] 1970/08/25 [id number]   First telephone outreach attempt to obtain mRS. No answer. Left message for returned call.  Vanice Sarah [facility] Management Assistant [phone]

## 2020-06-24 ENCOUNTER — Telehealth: Payer: Self-pay

## 2020-06-24 NOTE — Telephone Encounter (Signed)
Theora Gianotti, ST from [facility] called requesting HHST 1wk8.

## 2020-06-24 NOTE — Telephone Encounter (Signed)
Please make sure they get appropriate Rx and sent to [facility] for evaluation- thank you, ML

## 2020-06-24 NOTE — Telephone Encounter (Signed)
Sent to [facility].

## 2020-06-24 NOTE — Telephone Encounter (Signed)
Approved orders given.

## 2020-06-28 ENCOUNTER — Telehealth: Payer: Self-pay

## 2020-06-28 NOTE — Telephone Encounter (Signed)
Darnel, PT from [facility] called requesting verbal orders for HHPT 2wk4 starting week of 07/03/20. Orders approved and given.

## 2020-06-29 ENCOUNTER — Telehealth: Payer: Self-pay

## 2020-06-29 NOTE — Telephone Encounter (Signed)
Deborah [name], OT from [facility] requesting HHOT 1wk4. Orders given and approved.

## 2020-07-05 ENCOUNTER — Other Ambulatory Visit: Payer: Self-pay

## 2020-07-05 NOTE — Patient Outreach (Signed)
[facility]) Care Management  07/05/2020  Deborah [name] 31-Jul-1970 [id number]   Telephone outreach to patient to obtain mRS was successfully completed. MRS= 5  Thank you, Vanice Sarah [facility] Care Management Assistant

## 2020-07-06 DIAGNOSIS — E1165 Type 2 diabetes mellitus with hyperglycemia: Secondary | ICD-10-CM | POA: Diagnosis not present

## 2020-07-06 DIAGNOSIS — Z7902 Long term (current) use of antithrombotics/antiplatelets: Secondary | ICD-10-CM

## 2020-07-06 DIAGNOSIS — Z9181 History of falling: Secondary | ICD-10-CM

## 2020-07-06 DIAGNOSIS — Z79899 Other long term (current) drug therapy: Secondary | ICD-10-CM

## 2020-07-06 DIAGNOSIS — E669 Obesity, unspecified: Secondary | ICD-10-CM

## 2020-07-06 DIAGNOSIS — D62 Acute posthemorrhagic anemia: Secondary | ICD-10-CM

## 2020-07-06 DIAGNOSIS — G4710 Hypersomnia, unspecified: Secondary | ICD-10-CM

## 2020-07-06 DIAGNOSIS — K5901 Slow transit constipation: Secondary | ICD-10-CM

## 2020-07-06 DIAGNOSIS — Z794 Long term (current) use of insulin: Secondary | ICD-10-CM

## 2020-07-06 DIAGNOSIS — Z931 Gastrostomy status: Secondary | ICD-10-CM

## 2020-07-06 DIAGNOSIS — G43909 Migraine, unspecified, not intractable, without status migrainosus: Secondary | ICD-10-CM

## 2020-07-06 DIAGNOSIS — E785 Hyperlipidemia, unspecified: Secondary | ICD-10-CM

## 2020-07-06 DIAGNOSIS — I69391 Dysphagia following cerebral infarction: Secondary | ICD-10-CM

## 2020-07-06 DIAGNOSIS — G8929 Other chronic pain: Secondary | ICD-10-CM

## 2020-07-06 DIAGNOSIS — Z6839 Body mass index (BMI) 39.0-39.9, adult: Secondary | ICD-10-CM

## 2020-07-06 DIAGNOSIS — I69351 Hemiplegia and hemiparesis following cerebral infarction affecting right dominant side: Secondary | ICD-10-CM | POA: Diagnosis not present

## 2020-07-06 DIAGNOSIS — I69320 Aphasia following cerebral infarction: Secondary | ICD-10-CM | POA: Diagnosis not present

## 2020-07-06 DIAGNOSIS — F17210 Nicotine dependence, cigarettes, uncomplicated: Secondary | ICD-10-CM

## 2020-07-06 DIAGNOSIS — Z79891 Long term (current) use of opiate analgesic: Secondary | ICD-10-CM

## 2020-07-06 DIAGNOSIS — R339 Retention of urine, unspecified: Secondary | ICD-10-CM

## 2020-07-08 ENCOUNTER — Telehealth: Payer: Self-pay | Admitting: *Deleted

## 2020-07-08 ENCOUNTER — Encounter: Payer: Self-pay | Admitting: Physical Medicine and Rehabilitation

## 2020-07-08 ENCOUNTER — Encounter: Payer: Medicaid Other | Attending: Registered Nurse | Admitting: Physical Medicine and Rehabilitation

## 2020-07-08 ENCOUNTER — Other Ambulatory Visit: Payer: Self-pay

## 2020-07-08 VITALS — BP 143/82 | HR 61 | Temp 97.8°F

## 2020-07-08 DIAGNOSIS — R1312 Dysphagia, oropharyngeal phase: Secondary | ICD-10-CM | POA: Diagnosis not present

## 2020-07-08 MED ORDER — AMANTADINE HCL 100 MG PO CAPS: 200.0000 mg | ORAL_CAPSULE | Freq: Every day | ORAL | 3 refills | Status: DC

## 2020-07-08 MED ORDER — BACLOFEN 10 MG PO TABS: 5.0000 mg | ORAL_TABLET | Freq: Four times a day (QID) | ORAL | 5 refills | Status: DC

## 2020-07-08 MED ORDER — TRAMADOL HCL 50 MG PO TABS: 50.0000 mg | ORAL_TABLET | Freq: Four times a day (QID) | ORAL | 5 refills | Status: DC | PRN

## 2020-07-08 NOTE — Progress Notes (Signed)
Subjective:    Patient ID: Deborah [name], female    DOB: 1970/10/21, 50 y.o.   MRN: [id number]  HPI   Pt is a 50 yr old female with L MCA stroke and R hemiplegia- as well as aphasia here for f/u.    Things going well per son.   Only issue is when started doing more aggressive ROM of RUE- esp wrist- hurting pretty bad.   Usually gives her tramadol and 3 tylenol during the day and another round at night.   Having a lot of Spasticity in RUE >RLE.  A lot of pain due to spasticity.   "talking a little better".    Eating well-  Ate poptart per pt before got here- ate steak in strips.   BGs are doing OK- BGs  102- 157 lately.  Only higher when gave her a yoo-hoo drink.   BG's are better controlled than before stroke.    Getting H/H therapy, PT, OT and SLP. They are working her hard.    Needs a f/u with Dr Levon Hedger or Neurology-   Pain Inventory Average Pain 5 Pain Right Now 7 My pain is sharp, dull and aching  LOCATION OF PAIN  Shoulder, elbow, wrist, hand  BOWEL Number of stools per week: 3-4 Oral laxative use No  Type of laxative na Enema or suppository use No  History of colostomy No  Incontinent No   BLADDER Normal In and out cath, frequency na Able to self cath na Bladder incontinence No  Frequent urination No  Leakage with coughing No  Difficulty starting stream No  Incomplete bladder emptying No    Mobility walk without assistance walk with assistance use a cane use a walker ability to climb steps?  yes do you drive?  no use a wheelchair needs help with transfers  Function not employed: date last employed . I need assistance with the following:  dressing, bathing and toileting  Neuro/Psych trouble walking spasms confusion anxiety  Prior Studies Any changes since last visit?  no  Physicians involved in your care Any changes since last visit?  no   Family History  Problem Relation Age of Onset  . Heart defect Mother         Leaky Valve  . Heart Problems Father        Enlarged heart   . Heart defect Maternal Grandmother        Leaky Valve   . Stroke Paternal Grandfather    Social History   Socioeconomic History  . Marital status: Married    Spouse name: Not on file  . Number of children: Not on file  . Years of education: Not on file  . Highest education level: Not on file  Occupational History  . Not on file  Tobacco Use  . Smoking status: Current Every Day Smoker    Packs/day: 0.50    Types: Cigarettes  . Smokeless tobacco: Never Used  Vaping Use  . Vaping Use: Never used  Substance and Sexual Activity  . Alcohol use: Not Currently  . Drug use: Not Currently  . Sexual activity: Yes  Other Topics Concern  . Not on file  Social History Narrative  . Not on file   Social Determinants of Health   Financial Resource Strain: Not on file  Food Insecurity: Not on file  Transportation Needs: Not on file  Physical Activity: Not on file  Stress: Not on file  Social Connections: Not on file   Past  Surgical History:  Procedure Laterality Date  . IR CT HEAD LTD  03/08/2020  . IR GASTROSTOMY TUBE MOD SED  04/25/2020  . IR PERCUTANEOUS ART THROMBECTOMY/INFUSION INTRACRANIAL INC DIAG ANGIO  03/08/2020  . RADIOLOGY WITH ANESTHESIA N/A 03/08/2020   Procedure: IR WITH ANESTHESIA;  Surgeon: Julieanne Cotton, MD;  Location: [facility] OR;  Service: Radiology;  Laterality: N/A;   Past Medical History:  Diagnosis Date  . DM (diabetes mellitus), type 2 (HCC)   . Hyperlipidemia   . Hypersomnia   . Sprain of right ankle, initial encounter   . Stroke (HCC)   . Syncope    BP (!) 143/82   Pulse 61   Temp 97.8 F (36.6 C)   SpO2 98%   Opioid Risk Score:   Fall Risk Score:  `1  Depression screen PHQ 2/9  Depression screen PHQ 2/9 05/20/2020  Decreased Interest 0  Down, Depressed, Hopeless 0  PHQ - 2 Score 0  Altered sleeping 0  Tired, decreased energy 1  Change in appetite 0  Feeling bad or failure  about yourself  1  Trouble concentrating 0  Moving slowly or fidgety/restless 0  Suicidal thoughts 0  PHQ-9 Score 2    Review of Systems  Musculoskeletal: Positive for gait problem.       Shoulder, elbow, wrist, hand pain spasms  Psychiatric/Behavioral: The patient is nervous/anxious.   All other systems reviewed and are negative.      Objective:   Physical Exam  Awake, alert,  In manual w/c, NAD Said yes that was understood- said [name]- when asked her name.  [name] when cued as well as son's name- [name].  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  MS: Hard to assess strength in RUE- very painful due to spasticity- can't/won't move voluntarily RLE- HF 0/5, KE 2/5, DF and PF 0/5 PEG tube area is moist    Assessment & Plan:    Pt is a 50 yr old female with L MCA stroke and R hemiplegia- as well as aphasia here for f/u.    1. Needs to make follow ups with Pulmonary and Neurology.   2. Has handicapped placard-doesn't need one.   3. Will have Dr Wynn Banker or Dr Allena Katz scheduled ASAP for Botox of RUE- her MAS of 3-4 already-   4. 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   5. Needs to have PEG tube removed per pt. . They haven't used it- for 4+ weeks  Placed 04/25/2020.   6. Has refills for tramadol- but only 30 tabs/month- so will change Rx to do 3x/day as needed #90- with 5 refills.   7. Referral to interventional radiology to get PEG tube removed ASAP.  Call son to schedule was placed by IR ~ 04/25/2020- so need to have them remove it.    8. F/u in 2 months   9. R wrist splint- resting hand splint; R AFO for r foot drop   I spent a total of 35 minutes on appointment- as detailed above.

## 2020-07-08 NOTE — Telephone Encounter (Signed)
[facility]ora Gianotti ST called to report that Deborah [name] will have one missed ST visit this week due to conflict with MD appointment.

## 2020-07-08 NOTE — Patient Instructions (Addendum)
Pt is a 50 yr old female with L MCA stroke and R hemiplegia- as well as aphasia here for f/u.    1. Needs to make follow ups with Pulmonary and Neurology.   2. Has handicapped placard-doesn't need one.   3. Will have Dr Wynn Banker or Dr Allena Katz scheduled ASAP for Botox of RUE- her MAS of 3-4 already-   4. 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   5. Needs to have PEG tube removed per pt. . They haven't used it- for 4+ weeks  Placed 04/25/2020.   6. Has refills for tramadol- but only 30 tabs/month- so will change Rx to do 3x/day as needed #90- with 5 refills.   7. Referral to interventional radiology to get PEG tube removed ASAP.  Call son to schedule was placed by IR ~ 04/25/2020- so need to have them remove it.    8. F/u in 2 months  9. R wrist splint- resting hand splint; R AFO for r foot drop

## 2020-07-13 ENCOUNTER — Other Ambulatory Visit (HOSPITAL_COMMUNITY): Payer: Self-pay | Admitting: Physical Medicine and Rehabilitation

## 2020-07-13 DIAGNOSIS — R1312 Dysphagia, oropharyngeal phase: Secondary | ICD-10-CM

## 2020-07-14 ENCOUNTER — Telehealth (HOSPITAL_COMMUNITY): Payer: Self-pay

## 2020-07-14 NOTE — Telephone Encounter (Signed)
Called to schedule peg removal. The number in the system for the son was not in service. Left a vm on the pt's home number. AW

## 2020-08-01 ENCOUNTER — Telehealth: Payer: Self-pay

## 2020-08-01 NOTE — Telephone Encounter (Signed)
Discharge note reviewed: OT given to [facility] for home visits. Once a week for 4 weeks to provide OT.  Deborah [name] [phone]).

## 2020-08-02 ENCOUNTER — Telehealth: Payer: Self-pay | Admitting: *Deleted

## 2020-08-02 ENCOUNTER — Telehealth (HOSPITAL_COMMUNITY): Payer: Self-pay

## 2020-08-02 NOTE — Telephone Encounter (Signed)
Called pt's son to schedule peg removal, no answer, left vm. AW

## 2020-08-02 NOTE — Telephone Encounter (Signed)
Darnell PT [facility] called for visits 1wk1 2wk2. Approval given.

## 2020-08-05 ENCOUNTER — Other Ambulatory Visit: Payer: Self-pay

## 2020-08-05 ENCOUNTER — Encounter: Payer: Self-pay | Admitting: Physical Medicine & Rehabilitation

## 2020-08-05 ENCOUNTER — Encounter: Payer: Medicaid Other | Attending: Registered Nurse | Admitting: Physical Medicine & Rehabilitation

## 2020-08-05 VITALS — BP 132/81 | HR 82 | Temp 98.6°F | Ht 60.0 in | Wt 194.9 lb

## 2020-08-05 DIAGNOSIS — G8111 Spastic hemiplegia affecting right dominant side: Secondary | ICD-10-CM | POA: Diagnosis present

## 2020-08-05 NOTE — Patient Instructions (Signed)

## 2020-08-05 NOTE — Progress Notes (Signed)
  Dysport Injection for spasticity using needle EMG guidance  Dilution: 200 Units/ml Indication: Severe spasticity which interferes with ADL,mobility and/or  hygiene and is unresponsive to medication management and other conservative care Informed consent was obtained after describing risks and benefits of the procedure with the patient. This includes bleeding, bruising, infection, excessive weakness, or medication side effects. A REMS form is on file and signed. Needle:  needle electrode Number of units per muscle FDS 100 units FDP 100 units FPL 100 units FCR 200 units All injections were done after obtaining appropriate EMG activity and after negative drawback for blood. The patient tolerated the procedure well. Post procedure instructions were given. A followup appointment was made.

## 2020-08-12 ENCOUNTER — Other Ambulatory Visit: Payer: Self-pay

## 2020-08-12 ENCOUNTER — Ambulatory Visit (HOSPITAL_COMMUNITY)
Admission: RE | Admit: 2020-08-12 | Discharge: 2020-08-12 | Disposition: A | Discharge status: Home / Self Care | Payer: Medicaid Other | Source: Ambulatory Visit | Attending: Physical Medicine and Rehabilitation | Admitting: Physical Medicine and Rehabilitation

## 2020-08-12 DIAGNOSIS — Z431 Encounter for attention to gastrostomy: Secondary | ICD-10-CM | POA: Insufficient documentation

## 2020-08-12 DIAGNOSIS — R1312 Dysphagia, oropharyngeal phase: Secondary | ICD-10-CM | POA: Insufficient documentation

## 2020-08-12 HISTORY — PX: IR GASTROSTOMY TUBE REMOVAL: IMG5492

## 2020-08-12 MED ORDER — LIDOCAINE VISCOUS HCL 2 % MT SOLN
OROMUCOSAL | Status: AC
  Filled 2020-08-12: qty 15

## 2020-08-12 MED ORDER — LIDOCAINE HCL (PF) 1 % IJ SOLN
INTRAMUSCULAR | Status: AC | PRN
Start: 2020-08-12 — End: 2020-08-12
  Administered 2020-08-12: 10 mL

## 2020-08-22 ENCOUNTER — Telehealth: Payer: Self-pay | Admitting: *Deleted

## 2020-08-22 NOTE — Telephone Encounter (Signed)
Tomma Lightning PT called for VO to reassess the patient for home PT as end of cert period is next week and she is not ready to be discharged. Approval given.

## 2020-08-26 ENCOUNTER — Telehealth: Payer: Self-pay

## 2020-08-26 NOTE — Telephone Encounter (Signed)
Verbal approval granted,  for Speech Therapy to resume 08/19/2020:    Deborah [name] Speech Therapy with [facility] given permission. Services once a week for two weeks, one week off, then once a week for one week.

## 2020-08-30 ENCOUNTER — Telehealth: Payer: Self-pay

## 2020-08-30 NOTE — Telephone Encounter (Signed)
Darnel, PT from [facility] called requesting VO for PT ext 1wk5. Approved and given.

## 2020-09-02 ENCOUNTER — Encounter: Payer: Self-pay | Admitting: Physical Medicine and Rehabilitation

## 2020-09-02 ENCOUNTER — Other Ambulatory Visit: Payer: Self-pay

## 2020-09-02 ENCOUNTER — Encounter: Payer: Medicaid Other | Attending: Registered Nurse | Admitting: Physical Medicine and Rehabilitation

## 2020-09-02 VITALS — BP 146/83 | HR 72 | Temp 98.8°F

## 2020-09-02 DIAGNOSIS — I69320 Aphasia following cerebral infarction: Secondary | ICD-10-CM | POA: Insufficient documentation

## 2020-09-02 DIAGNOSIS — I6930 Unspecified sequelae of cerebral infarction: Secondary | ICD-10-CM | POA: Diagnosis present

## 2020-09-02 DIAGNOSIS — I69351 Hemiplegia and hemiparesis following cerebral infarction affecting right dominant side: Secondary | ICD-10-CM | POA: Insufficient documentation

## 2020-09-02 NOTE — Progress Notes (Signed)
Subjective:    Patient ID: Deborah [name], female    DOB: 1970-11-10, 50 y.o.   MRN: [id number]  HPI  Pt is a 50 yr old female with L MCA stroke and R hemiplegia- as well as aphasia here for f/u of spasticity and aphasia as well as R hemiplegia/CVA.    Patient underwent EMG guided Dysport injections of RUE: FDS 100 units FDP 100 units FPL 100 units FCR 200 units  Botox has loosened her muscles up a lot- not as stiff- less pain as well.  Sometimes looks like making OK sign with R thumb and 2nd digit Trying to stop her from curling 1st/2nd digits Not wearing resting arm brace lately/since Botox.    Walking a little more- 15-20 ft- holding onto significant other- getting more aware of control of R ankle.  If flattens foot, less painful Still locking R knee out- PT suggested a knee brace-but neither of [place] know which name of brace- pulls R foot into her L still-   Washes her own hair and a little more than 50% of her body- and steps in to get it done and to be thorough.  Speech a little bit better-  Got PEG out- ecstatic since it came out.  Got foot brace from [employer]- right now not wearing it. Has R AFO now- doesn't put on at home- wants to make her "work it".   Finished OT H/H.  SLP has 3 more visits- and then will stop.  Not sure about PT- More tolerant of letting her R arm/shoulder be elevated- ~ 45 degrees minimum- can now get up high enough to get deodorant on her.    Still taking the bethanachol baclofen 5 mg BID, not QID.  Continuing with tramadol for pain prn.    Goes to sleep in AM- 5-7am- and wakes up 2-5 pm  Pain Inventory Average Pain 3 Pain Right Now 3 My pain is sharp, dull and stabbing  In the last 24 hours, has pain interfered with the following? General activity 3 Relation with others 3 Enjoyment of life 0 What TIME of day is your pain at its worst? varies Sleep (in general) Good  Pain is worse with: unsure Pain improves with: na Relief from  Meds: 8  Family History  Problem Relation Age of Onset  . Heart defect Mother        Leaky Valve  . Heart Problems Father        Enlarged heart   . Heart defect Maternal Grandmother        Leaky Valve   . Stroke Paternal Grandfather    Social History   Socioeconomic History  . Marital status: Married    Spouse name: Not on file  . Number of children: Not on file  . Years of education: Not on file  . Highest education level: Not on file  Occupational History  . Not on file  Tobacco Use  . Smoking status: Current Every Day Smoker    Packs/day: 0.50    Types: Cigarettes  . Smokeless tobacco: Never Used  Vaping Use  . Vaping Use: Never used  Substance and Sexual Activity  . Alcohol use: Not Currently  . Drug use: Not Currently  . Sexual activity: Yes  Other Topics Concern  . Not on file  Social History Narrative  . Not on file   Social Determinants of Health   Financial Resource Strain: Not on file  Food Insecurity: Not on file  Transportation Needs:  Not on file  Physical Activity: Not on file  Stress: Not on file  Social Connections: Not on file   Past Surgical History:  Procedure Laterality Date  . IR CT HEAD LTD  03/08/2020  . IR GASTROSTOMY TUBE MOD SED  04/25/2020  . IR GASTROSTOMY TUBE REMOVAL  08/12/2020  . IR PERCUTANEOUS ART THROMBECTOMY/INFUSION INTRACRANIAL INC DIAG ANGIO  03/08/2020  . RADIOLOGY WITH ANESTHESIA N/A 03/08/2020   Procedure: IR WITH ANESTHESIA;  Surgeon: Julieanne Cotton, MD;  Location: [facility] OR;  Service: Radiology;  Laterality: N/A;   Past Surgical History:  Procedure Laterality Date  . IR CT HEAD LTD  03/08/2020  . IR GASTROSTOMY TUBE MOD SED  04/25/2020  . IR GASTROSTOMY TUBE REMOVAL  08/12/2020  . IR PERCUTANEOUS ART THROMBECTOMY/INFUSION INTRACRANIAL INC DIAG ANGIO  03/08/2020  . RADIOLOGY WITH ANESTHESIA N/A 03/08/2020   Procedure: IR WITH ANESTHESIA;  Surgeon: Julieanne Cotton, MD;  Location: [facility] OR;  Service: Radiology;  Laterality:  N/A;   Past Medical History:  Diagnosis Date  . DM (diabetes mellitus), type 2 (HCC)   . Hyperlipidemia   . Hypersomnia   . Sprain of right ankle, initial encounter   . Stroke (HCC)   . Syncope    BP (!) 146/83   Pulse 72   Temp 98.8 F (37.1 C)   SpO2 100%   Opioid Risk Score:   Fall Risk Score:  `1  Depression screen PHQ 2/9  Depression screen [facility] 2/9 08/05/2020 05/20/2020  Decreased Interest 0 0  Down, Depressed, Hopeless 0 0  PHQ - 2 Score 0 0  Altered sleeping - 0  Tired, decreased energy - 1  Change in appetite - 0  Feeling bad or failure about yourself  - 1  Trouble concentrating - 0  Moving slowly or fidgety/restless - 0  Suicidal thoughts - 0  PHQ-9 Score - 2    Review of Systems  Musculoskeletal:       Arm pain  All other systems reviewed and are negative.      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 Botox 08/05/20 by Dr Wynn Banker- 200 units used   1. Suggest using R AFO/foot brace to wear in afternoon/evening.  Since her muscles will be too tired at that time, increases risk of falls. Can go with out in AM.   2.  Suggest trying to use Baclofen at least 5 mg 3x/day- don't go for the 4x/day unless she's sick- I.e has infection.- Spasms get worse and tightness gets worse, if a patient has infection.    3. Has refills for tramadol- has decreased dose- usually takes every 1-2 days.   4.  Con't Amantadine - 200 mg  daily- suggest if can take when wakes up is ideals. .   5. If increases baclofen, will have more difficulty walking- so will  con't current dose.  Trying to balance walking and tightness.   6. F/U late May 2022- 6-8 weeks.   7. 7. Sent for outpt PT and SLP- will see if PT Neurorehab wants OT as well. Will recommend Estim- for RUE/RLE since no hx of cancer, no contraindications   I spent a total of 35 minutes due to pt's aphasia and going over topics as detailed above.

## 2020-09-02 NOTE — Patient Instructions (Addendum)
Pt is a 50 yr old female with L MCA stroke and R hemiplegia- as well as aphasia here for f/u of spasticity and aphasia as well as R hemiplegia/CVA. Botox 08/05/20 by Dr Wynn Banker- 200 units used   1. Suggest using R AFO/foot brace to wear in afternoon/evening.  Since her muscles will be too tired at that time, increases risk of falls. Can go with out in AM.   2.  Suggest trying to use Baclofen at least 5 mg 3x/day- don't go for the 4x/day unless she's sick- I.e has infection.- Spasms get worse and tightness gets worse, if a patient has infection.    3. Has refills for tramadol- has decreased dose- usually takes every 1-2 days.   4.  Con't Amantadine - 200 mg daily- suggest if can take when wakes up is ideals. .   5. If increases baclofen, will have more difficulty walking- so will con't current dose.  Trying to balance walking and tightness.   6. F/U late May 2022- 6-8 weeks.   7. Sent for outpt PT and SLP- will see if PT Neurorehab wants OT as well. Will recommend Estim- for RUE/RLE since no hx of cancer, no contraindications

## 2020-09-21 ENCOUNTER — Telehealth: Payer: Self-pay | Admitting: *Deleted

## 2020-09-21 DIAGNOSIS — I63512 Cerebral infarction due to unspecified occlusion or stenosis of left middle cerebral artery: Secondary | ICD-10-CM

## 2020-09-21 DIAGNOSIS — I69351 Hemiplegia and hemiparesis following cerebral infarction affecting right dominant side: Secondary | ICD-10-CM

## 2020-09-21 DIAGNOSIS — I69320 Aphasia following cerebral infarction: Secondary | ICD-10-CM

## 2020-09-21 NOTE — Telephone Encounter (Signed)
Deborah [name] ST called to ext [facility]ST 1wk1 next week 4/10-16 and then will need to transition to outpt Neuro.  She requested referral for ST PT OT outpt neuro. It looks like Dr Berline Chough has already made referral for ST and PT. Deborah [name] ST says they will need the OT as well and will be ready for discharge from [facility] end of next week. Approval given for the 1 wk ext for ST and Referral placed for OT to outpt neuro.

## 2020-09-28 ENCOUNTER — Other Ambulatory Visit: Payer: Self-pay | Admitting: Physical Medicine and Rehabilitation

## 2020-10-28 ENCOUNTER — Other Ambulatory Visit: Payer: Self-pay

## 2020-10-28 ENCOUNTER — Encounter: Payer: Self-pay | Admitting: Physical Medicine and Rehabilitation

## 2020-10-28 ENCOUNTER — Encounter: Payer: Medicaid Other | Attending: Registered Nurse | Admitting: Physical Medicine and Rehabilitation

## 2020-10-28 VITALS — BP 159/76 | HR 76 | Temp 99.0°F

## 2020-10-28 DIAGNOSIS — I63512 Cerebral infarction due to unspecified occlusion or stenosis of left middle cerebral artery: Secondary | ICD-10-CM | POA: Diagnosis present

## 2020-10-28 DIAGNOSIS — G8111 Spastic hemiplegia affecting right dominant side: Secondary | ICD-10-CM | POA: Diagnosis not present

## 2020-10-28 DIAGNOSIS — I69398 Other sequelae of cerebral infarction: Secondary | ICD-10-CM | POA: Diagnosis present

## 2020-10-28 DIAGNOSIS — I69391 Dysphagia following cerebral infarction: Secondary | ICD-10-CM | POA: Insufficient documentation

## 2020-10-28 DIAGNOSIS — R252 Cramp and spasm: Secondary | ICD-10-CM | POA: Diagnosis present

## 2020-10-28 NOTE — Progress Notes (Signed)
Subjective:    Patient ID: Deborah [name], female    DOB: December 30, 1970, 50 y.o.   MRN: [id number]  HPI Pt is a 50 yr old female with L MCA stroke and R hemiplegia- as well as aphasia here for f/u of spasticity and aphasia as well as R hemiplegia/CVA. Botox 08/05/20 by Dr Wynn Banker- 200 units used Pt here for f/u as described above.   Her insurance made her stop H/H. Made her switch to outpt PT.   Supposed to start going to outpt PT.  Hasn't HAD to have it, since not getting therapy.  Hasn't been c/o pain- "just sitting around".  Not having pain.   Pt frustrated- and scared that will have to do outpt PT.  Pt in waiting room that was walking with cane- she wants to -on her own.   Got his SUV back- could step into SUV with a little help.   Not fighting RLE as much with hyperextension of knee and occ buckling of R knee. Also, scared of outpt PT- doesn't want to go.      Pain Inventory Average Pain 5 Pain Right Now 5 My pain is sharp, burning, dull, stabbing, tingling and aching  In the last 24 hours, has pain interfered with the following? General activity 5 Relation with others 5 Enjoyment of life 5 What TIME of day is your pain at its worst? morning  Sleep (in general) Good  Pain is worse with: inactivity Pain improves with: rest, therapy/exercise, pacing activities and medication Relief from Meds: n/a  Family History  Problem Relation Age of Onset  . Heart defect Mother        Leaky Valve  . Heart Problems Father        Enlarged heart   . Heart defect Maternal Grandmother        Leaky Valve   . Stroke Paternal Grandfather    Social History   Socioeconomic History  . Marital status: Married    Spouse name: Not on file  . Number of children: Not on file  . Years of education: Not on file  . Highest education level: Not on file  Occupational History  . Not on file  Tobacco Use  . Smoking status: Current Every Day Smoker    Packs/day: 0.50    Types:  Cigarettes  . Smokeless tobacco: Never Used  Vaping Use  . Vaping Use: Never used  Substance and Sexual Activity  . Alcohol use: Not Currently  . Drug use: Not Currently  . Sexual activity: Yes  Other Topics Concern  . Not on file  Social History Narrative  . Not on file   Social Determinants of Health   Financial Resource Strain: Not on file  Food Insecurity: Not on file  Transportation Needs: Not on file  Physical Activity: Not on file  Stress: Not on file  Social Connections: Not on file   Past Surgical History:  Procedure Laterality Date  . IR CT HEAD LTD  03/08/2020  . IR GASTROSTOMY TUBE MOD SED  04/25/2020  . IR GASTROSTOMY TUBE REMOVAL  08/12/2020  . IR PERCUTANEOUS ART THROMBECTOMY/INFUSION INTRACRANIAL INC DIAG ANGIO  03/08/2020  . RADIOLOGY WITH ANESTHESIA N/A 03/08/2020   Procedure: IR WITH ANESTHESIA;  Surgeon: Julieanne Cotton, MD;  Location: [facility] OR;  Service: Radiology;  Laterality: N/A;   Past Surgical History:  Procedure Laterality Date  . IR CT HEAD LTD  03/08/2020  . IR GASTROSTOMY TUBE MOD SED  04/25/2020  . IR  GASTROSTOMY TUBE REMOVAL  08/12/2020  . IR PERCUTANEOUS ART THROMBECTOMY/INFUSION INTRACRANIAL INC DIAG ANGIO  03/08/2020  . RADIOLOGY WITH ANESTHESIA N/A 03/08/2020   Procedure: IR WITH ANESTHESIA;  Surgeon: Julieanne Cotton, MD;  Location: [facility] OR;  Service: Radiology;  Laterality: N/A;   Past Medical History:  Diagnosis Date  . DM (diabetes mellitus), type 2 (HCC)   . Hyperlipidemia   . Hypersomnia   . Sprain of right ankle, initial encounter   . Stroke (HCC)   . Syncope    BP (!) 159/76   Pulse 76   Temp 99 F (37.2 C)   SpO2 98%   Opioid Risk Score:   Fall Risk Score:  `1  Depression screen PHQ 2/9  Depression screen [facility] 2/9 08/05/2020 05/20/2020  Decreased Interest 0 0  Down, Depressed, Hopeless 0 0  PHQ - 2 Score 0 0  Altered sleeping - 0  Tired, decreased energy - 1  Change in appetite - 0  Feeling bad or failure about  yourself  - 1  Trouble concentrating - 0  Moving slowly or fidgety/restless - 0  Suicidal thoughts - 0  PHQ-9 Score - 2    Review of Systems  Constitutional: Negative.   HENT: Negative.   Eyes: Negative.   Respiratory: Negative.   Cardiovascular: Negative.   Gastrointestinal: Negative.   Endocrine: Negative.   Genitourinary: Negative.   Musculoskeletal: Positive for arthralgias and gait problem.  Skin: Negative.   Allergic/Immunologic: Negative.   Neurological: Positive for speech difficulty.  Hematological: Negative.   Psychiatric/Behavioral: Positive for confusion.  All other systems reviewed and are negative.      Objective:   Physical Exam Awake, alert, very few words, but some- said no, yes and "I"  And "sorry" but then gibberish; accompanied by BF, NAD In manual w/c Holding RUE in some posturing- less so than before.  R hand/wrist slightly purple RUE- shoulder Elbow- has more flexion tone than tone with extension- MAS of 3 R wrist- MAS of 2 Finger/thumb- thumb has MAS of 2-3 , 2nd digit pulling in a little; 3/4 digits are good; 5th digit MAS of 2 RLE  MAS of 1+ in Hip/knee and ankle  L side no tone  RUE: 1/5 in R grip; otherwise 0/5 RLE: HF 2/5 and KE 2/5, but 0/5 in DF and 1/5 PF      Assessment & Plan:   Pt is a 50 yr old female with L MCA stroke and R hemiplegia- as well as aphasia here for f/u of spasticity and aphasia as well as R hemiplegia/CVA. Botox 08/05/20 by Dr Wynn Banker- 200 units used Pt here for f/u as described above.   1. When goes to outpt PT, needs to try Estim- -aso using 1 lb weights for ataxia?- con't this treatment in outpt PT and also to work with SLP for speech recovery.   2. Amantadine 200 mg daily- talking more and more. con't for now.   3. Only using R AFO for therapy and outside the house- I suggest using AFO daily for at least a few hours.   4. Will call me when needs refills on tramadol. Call when needs more.    5. Con't  Baclofen dosing right now- since tone actually better controlled- stable to slightly less.   6. Gave handicapped placard paperwork   7. F/U in 3 months- doesn't need refills at this time.    I spent a total of 25 minutes on visit- discussed not increasing baclofen since  spasticity is stable- also went over estim.

## 2020-10-28 NOTE — Patient Instructions (Signed)
Pt is a 50 yr old female with L MCA stroke and R hemiplegia- as well as aphasia here for f/u of spasticity and aphasia as well as R hemiplegia/CVA. Botox 08/05/20 by Dr Wynn Banker- 200 units used Pt here for f/u as described above.   1. When goes to outpt PT, needs to try Estim- -aso using 1 lb weights for ataxia?- con't this treatment in outpt PT and also to work with SLP for speech recovery.   2. Amantadine 200 mg daily- talking more and more. con't for now.   3. Only using R AFO for therapy and outside the house- I suggest using AFO daily for at least a few hours.   4. Will call me when needs refills on tramadol. Call when needs more.    5. Con't Baclofen dosing right now- since tone actually better controlled- stable to slightly less.   6. Gave handicapped placard paperwork   7. F/U in 3 months- doesn't need refills at this time.

## 2020-11-04 ENCOUNTER — Encounter: Payer: Self-pay | Admitting: Rehabilitative and Restorative Service Providers"

## 2020-11-04 ENCOUNTER — Ambulatory Visit: Payer: Medicaid Other | Admitting: Speech Pathology

## 2020-11-04 ENCOUNTER — Other Ambulatory Visit: Payer: Self-pay

## 2020-11-04 ENCOUNTER — Encounter: Payer: Self-pay | Admitting: Occupational Therapy

## 2020-11-04 ENCOUNTER — Ambulatory Visit: Payer: Medicaid Other | Admitting: Rehabilitative and Restorative Service Providers"

## 2020-11-04 ENCOUNTER — Ambulatory Visit: Payer: Medicaid Other | Attending: Physical Medicine and Rehabilitation | Admitting: Occupational Therapy

## 2020-11-04 ENCOUNTER — Encounter: Payer: Self-pay | Admitting: Speech Pathology

## 2020-11-04 DIAGNOSIS — R0902 Hypoxemia: Secondary | ICD-10-CM

## 2020-11-04 DIAGNOSIS — I63311 Cerebral infarction due to thrombosis of right middle cerebral artery: Secondary | ICD-10-CM

## 2020-11-04 DIAGNOSIS — R4701 Aphasia: Secondary | ICD-10-CM

## 2020-11-04 DIAGNOSIS — I69318 Other symptoms and signs involving cognitive functions following cerebral infarction: Secondary | ICD-10-CM | POA: Diagnosis present

## 2020-11-04 DIAGNOSIS — I69351 Hemiplegia and hemiparesis following cerebral infarction affecting right dominant side: Secondary | ICD-10-CM

## 2020-11-04 DIAGNOSIS — R2689 Other abnormalities of gait and mobility: Secondary | ICD-10-CM | POA: Diagnosis present

## 2020-11-04 DIAGNOSIS — R262 Difficulty in walking, not elsewhere classified: Secondary | ICD-10-CM

## 2020-11-04 DIAGNOSIS — R278 Other lack of coordination: Secondary | ICD-10-CM

## 2020-11-04 DIAGNOSIS — M6281 Muscle weakness (generalized): Secondary | ICD-10-CM

## 2020-11-04 DIAGNOSIS — R29818 Other symptoms and signs involving the nervous system: Secondary | ICD-10-CM | POA: Diagnosis present

## 2020-11-04 DIAGNOSIS — R41841 Cognitive communication deficit: Secondary | ICD-10-CM | POA: Diagnosis present

## 2020-11-04 NOTE — Therapy (Signed)
[facility] [address] [place], [place], [address] Phone: [phone]   Fax:  [phone]  Physical Therapy Evaluation  Patient Details  Name: Deborah [name] MRN: [id number] Date of Birth: 03/09/1971 Referring Provider (PT): Dr Genice RougeMegan Lovorn   Encounter Date: 11/04/2020   PT End of Session - 11/04/20 1026    Visit Number 1    Date for PT Re-Evaluation 01/27/21    Authorization Type [employer]    Activity Tolerance Patient tolerated treatment well    Behavior During Therapy Anxious           Past Medical History:  Diagnosis Date  . DM (diabetes mellitus), type 2 (HCC)   . Hyperlipidemia   . Hypersomnia   . Sprain of right ankle, initial encounter   . Stroke (HCC)   . Syncope     Past Surgical History:  Procedure Laterality Date  . IR CT HEAD LTD  03/08/2020  . IR GASTROSTOMY TUBE MOD SED  04/25/2020  . IR GASTROSTOMY TUBE REMOVAL  08/12/2020  . IR PERCUTANEOUS ART THROMBECTOMY/INFUSION INTRACRANIAL INC DIAG ANGIO  03/08/2020  . RADIOLOGY WITH ANESTHESIA N/A 03/08/2020   Procedure: IR WITH ANESTHESIA;  Surgeon: Julieanne Cottoneveshwar, Sanjeev, MD;  Location: [facility] OR;  Service: Radiology;  Laterality: N/A;    There were no vitals filed for this visit.    Subjective Assessment - 11/04/20 0932    Subjective Pt had CVA on March 08, 2020 and was in the hospital and had inpatient rehab until 04/29/21 and home health following hospitalization.  Pts husband present and reports that pt has been doing her HEP.  Pt has a R AFO and has been walking at home holding onto her husband.    Patient is accompained by: Family member    Limitations Walking    Currently in Pain? Yes    Pain Score 3     Pain Location Arm    Pain Orientation Right    Pain Descriptors / Indicators Aching              [facility] PT Assessment - 11/04/20 1037      Assessment   Medical Diagnosis L MCA CVA w/R hemiplegia    Referring Provider (PT) Dr  Genice RougeMegan Lovorn    Onset Date/Surgical Date 02/28/20    Hand Dominance Right    Prior Therapy Home health      Precautions   Precautions None      Restrictions   Weight Bearing Restrictions No      Balance Screen   Has the patient fallen in the past 6 months Yes      Home Environment   Living Environment Private residence    Living Arrangements Spouse/significant other;Children   adult children from prior relationship     Prior Function   Level of Independence Independent    Vocation On disability      Cognition   Overall Cognitive Status Impaired/Different from baseline      ROM / Strength   AROM / PROM / Strength PROM;Strength      PROM   Overall PR[facility] Comments [facility]      Strength   Strength Assessment Site Hip;Knee;Ankle    Right/Left Hip Right    Right Hip Flexion 2/5    Right Hip Extension 2/5    Right Hip ABduction 2-/5    Right Hip ADduction 2/5    Right/Left Knee Right    Right Knee Flexion 2/5  Right Knee Extension 3-/5    Right/Left Ankle Right    Right Ankle Dorsiflexion 2-/5    Right Ankle Plantar Flexion 2-/5      Ambulation/Gait   Ambulation/Gait Yes    Ambulation/Gait Assistance 3: Mod assist    Ambulation Distance (Feet) 10 Feet    Assistive device Large base quad cane    Gait Pattern Step-to pattern;Decreased arm swing - right;Decreased step length - right;Decreased stance time - right;Decreased stride length;Decreased hip/knee flexion - right;Decreased dorsiflexion - right;Decreased weight shift to right    Ambulation Surface Level;Indoor      High Level Balance   High Level Balance Comments Standing with quad cane x1 min 30 seconds prior to fatigue and needing to sit.                      Objective measurements completed on examination: See above findings.       [facility] Adult PT Treatment/Exercise - 11/04/20 1037      Transfers   Transfers Sit to Stand    Sit to Stand 4: Min Dealer for  proper hand placement      Exercises   Exercises Knee/Hip      Knee/Hip Exercises: Seated   Long Arc Quad Strengthening;Right;1 set;10 reps    Marching AAROM;Right;1 set;10 reps                  PT Education - 11/04/20 1024    Education Details Pt and husband educated about static standing, and weight shifts in standing.  Educated about use of a LBQC, husband states that he will go purchase one for the patient.    Person(s) Educated Patient;Spouse    Methods Explanation;Demonstration    Comprehension Verbalized understanding;Returned demonstration            PT Short Term Goals - 11/04/20 1047      PT SHORT TERM GOAL #1   Title Pt will be independent with initial HEP.    Time 3    Period Weeks             PT Long Term Goals - 11/04/20 1047      PT LONG TERM GOAL #1   Title Pt and caregiver will be indepdent with advanced HEP.    Time 12    Period Weeks    Status New      PT LONG TERM GOAL #2   Title RLE strength will increase to at least 3 to 3+/5 to allow for increase ability to perform transfers.    Time 12    Period Weeks    Status New      PT LONG TERM GOAL #3   Title Pt will be able to stand for 5 minutes with mod I to allow for completion of ADLs and IADLs.    Time 12    Period Weeks    Status New      PT LONG TERM GOAL #4   Title Pt will be able to ambulate greater than 250 ft with LRAD and mod I to allow her to ambulate around her home.    Time 12    Period Weeks                  Plan - 11/04/20 1028    Clinical Impression Statement Patient presents to PT with a referral from Dr Genice Rouge s/p L MCA CVA with R sided weakness in  September 2021.  She had inpatient rehab while in the hospital and was discharged home with home health with a recent discharge.  She presents with RLE weakness, difficulty walking with decreased weight bearing through RLE.  She has a walker at her home, but husband has found that it is difficulty to  control and he typically has patient hold onto his arm to ambulate.  Was able to practice ambulation with large based quad cane and close CGA and patient was able to ambulate 10 ft without a loss of balance.  Pt with slow gait velocity and requires cuing for increased step length and to bear weight through RLE.  Pt would benefit from skilled PT to progress her functional independence to allow her to perform more activities at home without assistance of husband.    Personal Factors and Comorbidities Time since onset of injury/illness/exacerbation;Fitness    Examination-Activity Limitations Locomotion Level;Transfers;Bed Mobility;Bend;Sit;Carry;Squat;Stairs;Stand;Lift    Examination-Participation Restrictions Community Activity;Meal Prep;Shop;Cleaning    Stability/Clinical Decision Making Evolving/Moderate complexity    Clinical Decision Making Moderate    Rehab Potential Good    PT Frequency 1x / week    PT Duration 12 weeks    PT Treatment/Interventions ADLs/Self Care Home Management;Cryotherapy;Electrical Stimulation;Iontophoresis 4mg /ml Dexamethasone;Moist Heat;Ultrasound;Gait training;Stair training;Functional mobility training;Therapeutic activities;Therapeutic exercise;Balance training;Patient/family education;Wheelchair mobility training;Manual techniques;Passive range of motion;Dry needling;Joint Manipulations    PT Next Visit Plan strengthening, ambulation with quad cane    PT Home Exercise Plan LAQ, seated marching, static standing, weight shifts in standing, ambulation    Consulted and Agree with Plan of Care Patient;Family member/caregiver    Family Member Consulted husband           Patient will benefit from skilled therapeutic intervention in order to improve the following deficits and impairments:  Decreased coordination,Difficulty walking,Decreased endurance,Increased muscle spasms,Impaired UE functional use,Pain,Decreased balance,Decreased mobility,Decreased strength  Visit  Diagnosis: Cerebrovascular accident (CVA) due to thrombosis of right middle cerebral artery (HCC) - Plan: PT plan of care cert/re-cert  Hypoxia - Plan: PT plan of care cert/re-cert  Muscle weakness (generalized) - Plan: PT plan of care cert/re-cert  Difficulty in walking, not elsewhere classified - Plan: PT plan of care cert/re-cert  Balance problem - Plan: PT plan of care cert/re-cert     Problem List Patient Active Problem List   Diagnosis Date Noted  . Spasticity as late effect of cerebrovascular accident (CVA) 10/28/2020  . Right spastic hemiplegia (HCC) 08/05/2020  . Slow transit constipation   . Acute blood loss anemia   . Urinary retention   . Right hemiparesis (HCC)   . Acute respiratory failure (HCC) 03/30/2020  . Cerebral edema (HCC) 03/30/2020  . Family hx-stroke 03/30/2020  . Left middle cerebral artery stroke (HCC) 03/30/2020  . Leukocytosis   . Uncontrolled type 2 diabetes mellitus with hyperglycemia (HCC)   . Status post tracheostomy (HCC)   . Dysphagia, post-stroke   . Acute lower UTI   . Tobacco abuse   . Cerebrovascular accident (CVA) due to thrombosis of middle cerebral artery (HCC) s/p attempted revascularization   . Palliative care by specialist   . Goals of care, counseling/discussion   . Acute ischemic left MCA stroke (HCC) 03/08/2020  . Middle cerebral artery embolism, left 03/08/2020  . Hypertension   . Dyslipidemia 12/10/2019  . Syncope and collapse 10/29/2019  . Atypical chest pain 10/29/2019  . Smoking 10/29/2019  . Anxiety 10/28/2019  . Diabetes mellitus (HCC) 10/28/2019  . Migraines 10/28/2019  . Rhinitis, allergic 10/28/2019  . Shortness  of breath 10/28/2019  . Chronic pyelonephritis 09/03/2016  . Atrophic kidney 07/19/2016  . Unspecified abdominal pain 07/19/2016  . PONV (postoperative nausea and vomiting) 06/18/2005    Reather Laurence, PT, DPT 11/04/2020, 10:56 AM  [facility] [address] [place], [place], [address] Phone: [phone]   Fax:  [phone]  Name: Deborah [name] MRN: [id number] Date of Birth: November 23, 1970

## 2020-11-04 NOTE — Addendum Note (Signed)
Addended by: Bertrum Sol L on: 11/04/2020 10:54 AM   Modules accepted: Orders

## 2020-11-04 NOTE — Therapy (Addendum)
[facility] [address] [place], [place], [address] Phone: [phone]   Fax:  [phone]  Speech Language Pathology Evaluation  Patient Details  Name: Deborah [name] MRN: [id number] Date of Birth: 05-Dec-1970 Referring Provider (SLP): Serita Sheller MD   Encounter Date: 11/04/2020   End of Session - 11/04/20 0916    Visit Number 1    Number of Visits 9    Date for SLP Re-Evaluation 01/04/21    SLP Start Time 0846    SLP Stop Time  0928    SLP Time Calculation (min) 42 min           Past Medical History:  Diagnosis Date  . DM (diabetes mellitus), type 2 (HCC)   . Hyperlipidemia   . Hypersomnia   . Sprain of right ankle, initial encounter   . Stroke (HCC)   . Syncope     Past Surgical History:  Procedure Laterality Date  . IR CT HEAD LTD  03/08/2020  . IR GASTROSTOMY TUBE MOD SED  04/25/2020  . IR GASTROSTOMY TUBE REMOVAL  08/12/2020  . IR PERCUTANEOUS ART THROMBECTOMY/INFUSION INTRACRANIAL INC DIAG ANGIO  03/08/2020  . RADIOLOGY WITH ANESTHESIA N/A 03/08/2020   Procedure: IR WITH ANESTHESIA;  Surgeon: Julieanne Cotton, MD;  Location: [facility] OR;  Service: Radiology;  Laterality: N/A;    There were no vitals filed for this visit.       SLP Evaluation [facility] - 11/04/20 6962      SLP Visit Information   SLP Received On 11/04/20    Referring Provider (SLP) Serita Sheller MD    Onset Date 03/08/20    Medical Diagnosis L MCA CVA      Subjective   Subjective Low eye-contact, anxious    Patient/Family Stated Goal To communicate better      General Information   HPI Post- L MCA CVA; Hemorrhage; intubation and trach on 03/13/20. Admitted to Rehab on 03/30/20. Decannulated on 04/22/20. PEG placement on 04/25/20 due to dysphagia. D/c'd rehab on 04/29/20. Was receiving HH until 10/01/20.      Balance Screen   Has the patient fallen in the past 6 months No      Prior Functional Status   Type of Home Mobile home     Lives  With Significant other;Son    Available Support Family    Vocation On disability      Cognition   Overall Cognitive Status Impaired/Different from baseline   Unable to fully assess due to global aphasia.   Area of Impairment Attention      Auditory Comprehension   Overall Auditory Comprehension Impaired    Yes/No Questions Impaired    Commands Impaired    Interfering Components Attention;Anxiety    EffectiveTechniques Extra processing time;Pausing;Repetition;Visual/Gestural cues      Reading Comprehension   Reading Status Impaired    Word level 0-25% accurate    Interfering Components Other (comment)   Verbal expression     Expression   Primary Mode of Expression Verbal      Verbal Expression   Overall Verbal Expression Impaired    Initiation Impaired    Automatic Speech Name;Counting    Level of Generative/Spontaneous Verbalization Word;Phrase    Repetition Impaired    Level of Impairment Phrase level    Naming Impairment    Confrontation 0-24% accurate    Other Naming Comments benefits from phonemic cueing    Verbal Errors Phonemic paraphasias;Semantic paraphasias;Inconsistent    Interfering Components Speech  intelligibility    Effective Techniques Sentence completion;Phonemic cues      Written Expression   Dominant Hand Right    Written Expression Exceptions to [facility]    Overall Writen Expression Using L hand      Motor Speech   Articulation Impaired    Level of Impairment Word    Intelligibility Intelligibility reduced    Word 25-49% accurate    Effective Techniques Slow rate      Standardized Assessments   Standardized Assessments  Other Assessment   WAB-B          Western Aphasia Battery - Bedside Record Form   Spontaneous Speech - Content: 1/10 Spontaneous Speech- Fluency: 2/10 Auditory Verbal Comprehension- Yes/No Questions: 4/10 Sequential Commands: 0/10  Repetition: 3/10 Object naming: 0/10 Reading: 0/10 Writing: 1/10 Apraxia (Optional):  NA  Classification: Global Aphasia ( fluency <5, aud comp<4, repetition<5)         SLP Short Term Goals - 11/04/20 1024      SLP SHORT TERM GOAL #1   Title Pt and/or patient husband will demonstrate use of 2 strategies to increase auditory comprehension.    Time 4    Period Weeks    Status New      SLP SHORT TERM GOAL #2   Title Pt and/or husband will demonstrate use of 2 strategies to increase expressive communication.    Time 4    Period Weeks    Status New      SLP SHORT TERM GOAL #3   Title SLP to assess patient with low-tech communication board.    Time 4    Period Weeks    Status New            SLP Long Term Goals - 11/04/20 1022      SLP LONG TERM GOAL #1   Title Pt husband will report successful use of expressive and receptive communication strategies at home to repair communication breakdowns.    Time 8    Period Weeks    Status New            Plan - 11/04/20 0957    Clinical Impression Statement Pt is a 50 yo female who presents today with residual impairments from L MCA CVA. Pt presents today with severe anxiety along with global aphasia. Upon initial conversation with husband and patient, SLP collected that she has had a "voice change" post-CVA in which husband refers to as "playful" and SLP perceives more as "child-like" - shorter utterances, higher pitch. Pt also  SLP assessed patient using the WAB-B, where pt presented with classification of "global aphasia". She continues to have deficits in fluency, repetition, and auditory comprehension. Husband reported he just wants to be able to have a conversation with her - he feels like she continues to make improvements every day. SLP to see patient to provide husband and patient with communication strategies to assist with patient voicing needs/wants at home.    Speech Therapy Frequency 1x /week    Duration 8 weeks    Treatment/Interventions Functional tasks;Patient/family education;Environmental  controls;Cognitive reorganization;Multimodal communcation approach;Language facilitation;Compensatory techniques;Internal/external aids;SLP instruction and feedback;Compensatory strategies    Potential to Achieve Goals Fair    Potential Considerations Ability to learn/carryover information;Previous level of function    Consulted and Agree with Plan of Care Patient;Family member/caregiver    Family Member Consulted [name]- Husband           Patient will benefit from skilled therapeutic intervention in order to improve the  following deficits and impairments:   Aphasia  Cognitive communication deficit    Problem List Patient Active Problem List   Diagnosis Date Noted  . Spasticity as late effect of cerebrovascular accident (CVA) 10/28/2020  . Right spastic hemiplegia (HCC) 08/05/2020  . Slow transit constipation   . Acute blood loss anemia   . Urinary retention   . Right hemiparesis (HCC)   . Acute respiratory failure (HCC) 03/30/2020  . Cerebral edema (HCC) 03/30/2020  . Family hx-stroke 03/30/2020  . Left middle cerebral artery stroke (HCC) 03/30/2020  . Leukocytosis   . Uncontrolled type 2 diabetes mellitus with hyperglycemia (HCC)   . Status post tracheostomy (HCC)   . Dysphagia, post-stroke   . Acute lower UTI   . Tobacco abuse   . Cerebrovascular accident (CVA) due to thrombosis of middle cerebral artery (HCC) s/p attempted revascularization   . Palliative care by specialist   . Goals of care, counseling/discussion   . Acute ischemic left MCA stroke (HCC) 03/08/2020  . Middle cerebral artery embolism, left 03/08/2020  . Hypertension   . Dyslipidemia 12/10/2019  . Syncope and collapse 10/29/2019  . Atypical chest pain 10/29/2019  . Smoking 10/29/2019  . Anxiety 10/28/2019  . Diabetes mellitus (HCC) 10/28/2019  . Migraines 10/28/2019  . Rhinitis, allergic 10/28/2019  . Shortness of breath 10/28/2019  . Chronic pyelonephritis 09/03/2016  . Atrophic kidney 07/19/2016   . Unspecified abdominal pain 07/19/2016  . PONV (postoperative nausea and vomiting) 06/18/2005    Michelene Gardener Grasston MS, [place], CBIS  11/04/2020, 10:35 AM  [facility] [address] [place], [place], [address] Phone: [phone]   Fax:  [phone]  Name: Corinthia Helmers MRN: [id number] Date of Birth: 1970/12/14

## 2020-11-05 NOTE — Therapy (Signed)
[facility] [address] [place], [place], [address] Phone: [phone]   Fax:  [phone]  Occupational Therapy Evaluation  Patient Details  Name: Deborah [name] MRN: [id number] Date of Birth: 22-May-1971 Referring Provider (OT): Genice Rouge, MD   Encounter Date: 11/04/2020   OT End of Session - 11/04/20 0900    Visit Number 1    Number of Visits 9    Date for OT Re-Evaluation 02/02/21    Authorization Type [place] Medicaid [employer]    Authorization Time Period VL 27/year max combined PT/OT/ST    Progress Note Due on Visit 10    OT Start Time 0805    OT Stop Time 0845    OT Time Calculation (min) 40 min    Behavior During Therapy Anxious           Past Medical History:  Diagnosis Date  . DM (diabetes mellitus), type 2 (HCC)   . Hyperlipidemia   . Hypersomnia   . Sprain of right ankle, initial encounter   . Stroke (HCC)   . Syncope     Past Surgical History:  Procedure Laterality Date  . IR CT HEAD LTD  03/08/2020  . IR GASTROSTOMY TUBE MOD SED  04/25/2020  . IR GASTROSTOMY TUBE REMOVAL  08/12/2020  . IR PERCUTANEOUS ART THROMBECTOMY/INFUSION INTRACRANIAL INC DIAG ANGIO  03/08/2020  . RADIOLOGY WITH ANESTHESIA N/A 03/08/2020   Procedure: IR WITH ANESTHESIA;  Surgeon: Julieanne Cotton, MD;  Location: [facility] OR;  Service: Radiology;  Laterality: N/A;    There were no vitals filed for this visit.    Subjective Assessment Subjective - Pt arrived to session today with her significant other [name]). She appeared extremely fearful/anxious, becoming tearful when initially meeting OT. Per chart review and report from pt and significant other, pt sustained CVA 03/08/20, was admitted to inpatient rehab 03/30/20, d/c to home 04/29/20, and recently completed HH PT/OT/ST. Due to pt's aphasia, pt's significant other states their primary concern is improving functional use of her RUE. Patient is accompanied by: [name] (significant  other) Limitations - Global aphasia, R hemiplegia, visual deficits/inattention Currently in Pain? Yes Pain Score - 3 Pain Location - Arm Pain Orientation - Right Pain Descriptors/Indicators - Aching (sharp pain w/ movement Pain Onset - More than a month ago Pain Frequency - Intermittent     [facility] OT Assessment - 11/04/20 0824      Assessment   Medical Diagnosis L MCA CVA w/ R hemiplegia    Referring Provider (OT) Genice Rouge, MD    Onset Date/Surgical Date 03/08/20    Hand Dominance Right    Next MD Visit 12/09/20   Botox injection   Prior Therapy acute, inpatient, HH PT/OT/ST      Precautions   Precautions None    Required Braces or Orthoses Other Brace/Splint    Other Brace/Splint R AFO; R resting hand orthosis (pt has not been wearing resting hand orthosis since receiving Botox injections)      Balance Screen   Has the patient fallen in the past 6 months No      Home  Environment   Family/patient expects to be discharged to: Private residence    Type of Home Mobile home    Home Access Ramped entrance    Lives With [place] (husband); son, grandchild, 2 dogs     Prior Function   Level of Independence Independent    Vocation Unemployed;On disability  ADL   Eating/Feeding Needs assist with cutting food    Grooming Other (comment)   To be further assessed   Upper Body Bathing Set up   Per significant other report; to be further assessed   Lower Body Bathing Set up   Per significant other report; to be further assessed   Upper Body Dressing Moderate assistance    Lower Body Dressing Maximal assistance    Tub/Shower Transfer Equipment Transfer tub bench;Walk in shower;Grab bars    Equipment Used Reacher    ADL comments Toileting to be further assessed      Mobility   Mobility Status Needs assist    Mobility Status Comments Arrived to session in manual w/c; reports having a RW at home, but typically using caregiver support for in-home mobility      Vision -  History   Additional Comments Per significant other, reports visual impairment; to be further assessed in functional context      Cognition   Overall Cognitive Status Cognition to be further assessed in functional context PRN   Difficult to assess due to global aphasia     Observation/Other Assessments   Observations Increased flexion pattern of R thumb and index finger      Sensation   Additional Comments To be further assessed      Coordination   Coordination and Movement Description WFL on L side      Perception   Perception Impaired    Comments To be further assessed in functional context      Praxis   Praxis Not tested    Praxis-Other Comments To be further assessed in functional context      ROM / Strength   AROM / PROM / Strength AROM;PROM;Strength      AROM   Overall AROM  Deficits    Overall AROM Comments Slight shoulder elevation in R shoulder; LUE WFL      PROM   Overall PROM  Deficits    Overall PROM Comments RUE PROM WFL for elbow, wrist, and hand; decreased ROM in R shoulder      Strength   Overall Strength Comments Unable to assess; to be further tested in functional context         **Difficulty completing comprehensive assessment due to pt's initial observable anxiety and discomfort; further assessment to be completed for AROM, PROM, strength, sensation, vision, and perception    OT Education - 11/04/20 0845    Education Details Education provided on role and purpose of OT, as well as potential interventions and goals for therapy.    Person(s) Educated Patient;Spouse    Methods Explanation    Comprehension Verbalized understanding            OT Short Term Goals - 11/04/20 0930      OT SHORT TERM GOAL #1   Title Pt and/or caregiver will verbalize understanding of body positioning and alignment for RUE    Baseline Decreased knowledge of joint protection strategies    Time 4    Period Weeks    Status New    Target Date 12/02/20      OT SHORT  TERM GOAL #2   Title Pt will improve ROM of shoulder flexion to between 60-75* to facilitate use for gross assist/stabilization during bilateral ADL/IADL tasks    Baseline Decreased PROM of R shoulder    Time 4    Period Weeks    Status New      OT SHORT TERM GOAL #3  Title Pt will be able to don overhead shirt/open-front jacket w/ Min A    Baseline Mod A w/ UB dressing    Time 4    Period Weeks    Status New      OT SHORT TERM GOAL #4   Title Pt will be able to thread BLEs into bottoms w/ Min A    Baseline Max A w/ LB dressing    Time 4    Period Weeks    Status New             OT Long Term Goals - 11/04/20 0930      OT LONG TERM GOAL #1   Title Pt and caregiver will be able to demonstrate full HEP for RUE (self-ROM/stretches, stabilization)    Baseline No HEP at this time    Time 8    Period Weeks    Status New    Target Date 12/31/20      OT LONG TERM GOAL #2   Title Pt will be able to complete UB dressing w/ Min A    Baseline Mod A w/ UB dressing    Time 8    Period Weeks    Status New      OT LONG TERM GOAL #3   Title Pt will be able to complete LB (excluding socks/shoes) w/ Mod A    Baseline Max A w/ LB dressing    Time 8    Period Weeks    Status New      OT LONG TERM GOAL #4   Title Pt will demonstrate use of RUE as gross assist/stabilization for bilateral tasks during ADL/IADLs    Baseline Decreased functional use of RUE    Time 8    Period Weeks    Status New      OT LONG TERM GOAL #5   Title Pt will be able to complete basic grooming tasks w/ Min A 100% of the time    Baseline Decreased independence w/ grooming tasks    Time 8    Period Weeks    Status New             Plan - 11/04/20 0900    Clinical Impression Statement Pt is a 50 y.o. female who presents to OP OT due to L MCA CVA w/ resultant R-sided hemiplegia sustained 8 months prior (03/08/20). Pt arrived to session presenting w/ global aphasia and significant anxiety that improved  during evaluation session. Relevant PMHx includes DMT2, HTN, HLD, anxiety, hx of syncope, and hx of sugery to R wrist. Pt will benefit from skilled occupational therapy services to address limitations in strength and coordination, ROM, pain management, altered sensation, functional use of BUEs, compensatory strategies and introduction of AE prn, visual-perception, and implementation of an HEP to improve participation and safety during ADLs and IADLs.    OT Occupational Profile and History Detailed Assessment- Review of Records and additional review of physical, cognitive, psychosocial history related to current functional performance    Occupational performance deficits (Please refer to evaluation for details): ADL's;Social Participation;IADL's;Leisure    Body Structure / Function / Physical Skills ADL;ROM;UE functional use;Decreased knowledge of use of DME;Body mechanics;FMC;Muscle spasms;Sensation;Vision;GMC;Strength;Pain;Endurance;Coordination;IADL;Proprioception;Tone    Cognitive Skills Problem Solve;Memory;Attention    Psychosocial Skills Environmental  Adaptations    Rehab Potential Fair    Clinical Decision Making Several treatment options, min-mod task modification necessary    Comorbidities Affecting Occupational Performance: Presence of comorbidities impacting occupational performance  Modification or Assistance to Complete Evaluation  Max significant modification of tasks or assist is necessary to complete    OT Frequency 1x / week    OT Duration 8 weeks   1x/week frequency due to insurance VL; may transition to every other week depending on progress and availability   OT Treatment/Interventions Self-care/ADL training;Electrical Stimulation;Therapeutic exercise;Visual/perceptual remediation/compensation;Patient/family education;Splinting;Neuromuscular education;Moist Heat;Aquatic Therapy;Biofeedback;Therapeutic activities;Cognitive remediation/compensation;Passive range of motion;Manual  Therapy;DME and/or AE instruction;Cryotherapy;Ultrasound    Plan Review all goals w/ pt; introduce weight bearing/weight shifting    Consulted and Agree with Plan of Care Patient;Family member/caregiver    Family Member Consulted Significant other [name](Jason)           Patient will benefit from skilled therapeutic intervention in order to improve the following deficits and impairments:   Body Structure / Function / Physical Skills: ADL,ROM,UE functional use,Decreased knowledge of use of DME,Body mechanics,FMC,Muscle spasms,Sensation,Vision,GMC,Strength,Pain,Endurance,Coordination,IADL,Proprioception,Tone Cognitive Skills: Problem Solve,Memory,Attention Psychosocial Skills: Environmental  Adaptations   Visit Diagnosis: No diagnosis found.    Problem List Patient Active Problem List   Diagnosis Date Noted  . Spasticity as late effect of cerebrovascular accident (CVA) 10/28/2020  . Right spastic hemiplegia (HCC) 08/05/2020  . Slow transit constipation   . Acute blood loss anemia   . Urinary retention   . Right hemiparesis (HCC)   . Acute respiratory failure (HCC) 03/30/2020  . Cerebral edema (HCC) 03/30/2020  . Family hx-stroke 03/30/2020  . Left middle cerebral artery stroke (HCC) 03/30/2020  . Leukocytosis   . Uncontrolled type 2 diabetes mellitus with hyperglycemia (HCC)   . Status post tracheostomy (HCC)   . Dysphagia, post-stroke   . Acute lower UTI   . Tobacco abuse   . Cerebrovascular accident (CVA) due to thrombosis of middle cerebral artery (HCC) s/p attempted revascularization   . Palliative care by specialist   . Goals of care, counseling/discussion   . Acute ischemic left MCA stroke (HCC) 03/08/2020  . Middle cerebral artery embolism, left 03/08/2020  . Hypertension   . Dyslipidemia 12/10/2019  . Syncope and collapse 10/29/2019  . Atypical chest pain 10/29/2019  . Smoking 10/29/2019  . Anxiety 10/28/2019  . Diabetes mellitus (HCC) 10/28/2019  . Migraines  10/28/2019  . Rhinitis, allergic 10/28/2019  . Shortness of breath 10/28/2019  . Chronic pyelonephritis 09/03/2016  . Atrophic kidney 07/19/2016  . Unspecified abdominal pain 07/19/2016  . PONV (postoperative nausea and vomiting) 06/18/2005     Rosie FateKelsey Jermany Sundell, OTR/L, MSOT 11/04/2020, 10:00 AM  [facility] [address] [place], [place], [id number] Phone: [phone]   Fax:  [phone]  Name: Deborah AltoSylvia [name] MRN: [id number] Date of Birth: 09/15/1970

## 2020-11-18 ENCOUNTER — Ambulatory Visit: Payer: Medicaid Other | Admitting: Occupational Therapy

## 2020-11-18 ENCOUNTER — Ambulatory Visit: Payer: Medicaid Other | Admitting: Physical Therapy

## 2020-11-18 ENCOUNTER — Other Ambulatory Visit: Payer: Self-pay | Admitting: Physical Medicine and Rehabilitation

## 2020-11-25 ENCOUNTER — Ambulatory Visit: Payer: Medicaid Other | Admitting: Speech Pathology

## 2020-11-25 ENCOUNTER — Ambulatory Visit: Payer: Medicaid Other | Admitting: Occupational Therapy

## 2020-11-25 ENCOUNTER — Ambulatory Visit: Payer: Medicaid Other | Admitting: Rehabilitative and Restorative Service Providers"

## 2020-12-02 IMAGING — CT CT HEAD W/O CM
4 series · 16 of 47 positions shown, 18 images · non-contrast
Comparison: Brain MRI from 2 days ago

CLINICAL DATA: Stroke follow-up

EXAM:
CT HEAD WITHOUT CONTRAST
TECHNIQUE: Contiguous axial images were obtained from the base of the skull
through the vertex without intravenous contrast.

[Series 3: head wo · axial · 0.39mm/px · z∈[-112,+8]mm · 7 of 32 slices shown, 9 images]
[im 4/32  brain]
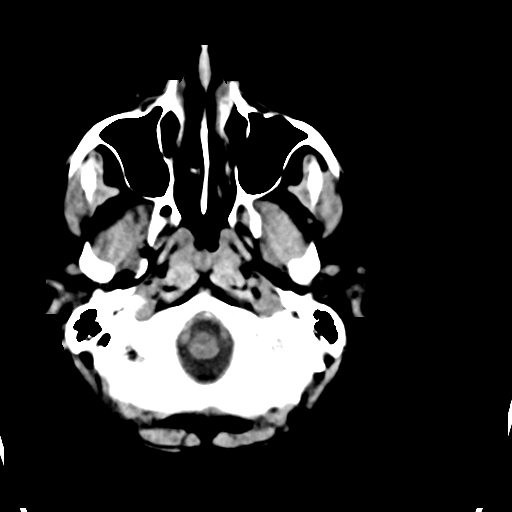
[im 4/32  bone]
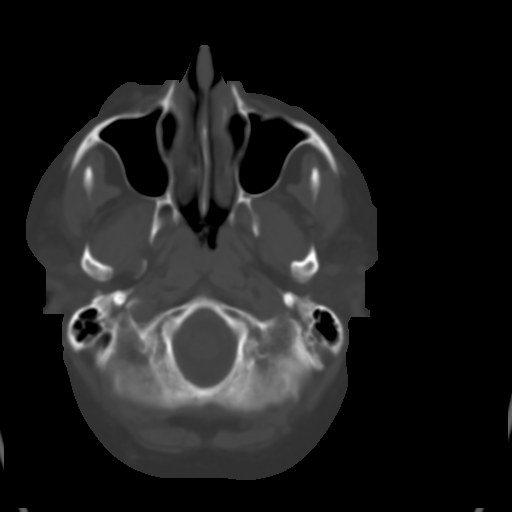
[im 8/32  brain]
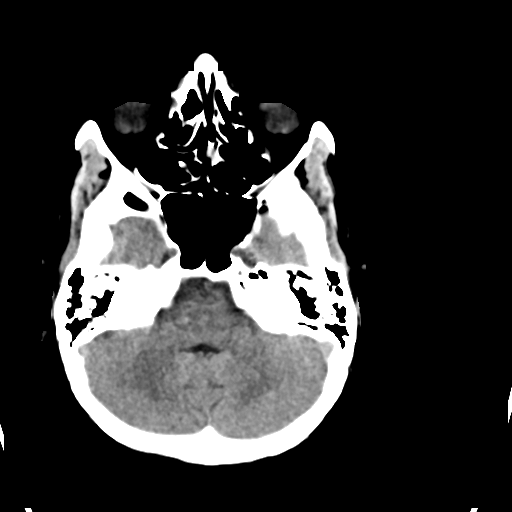
[im 12/32  brain]
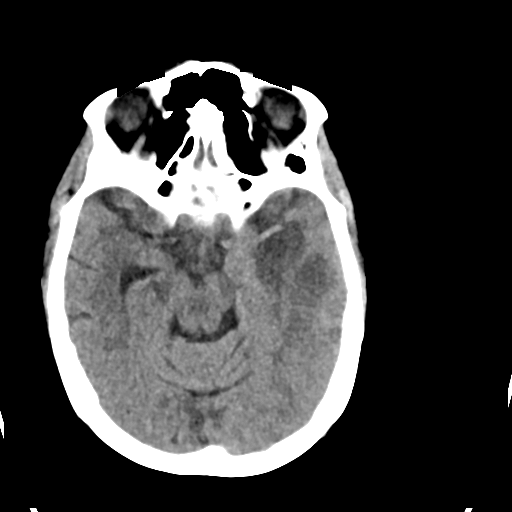
[im 16/32  brain]
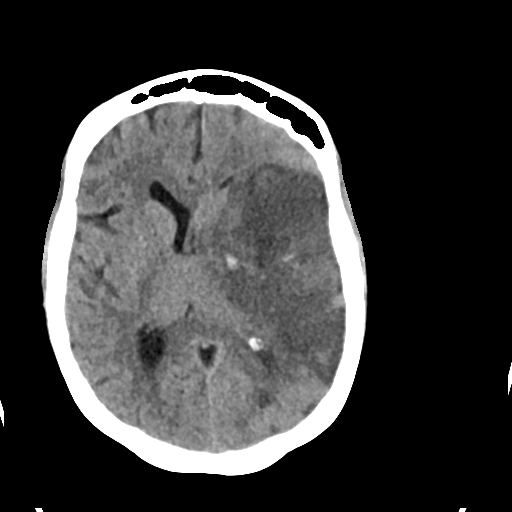
[im 20/32  brain]
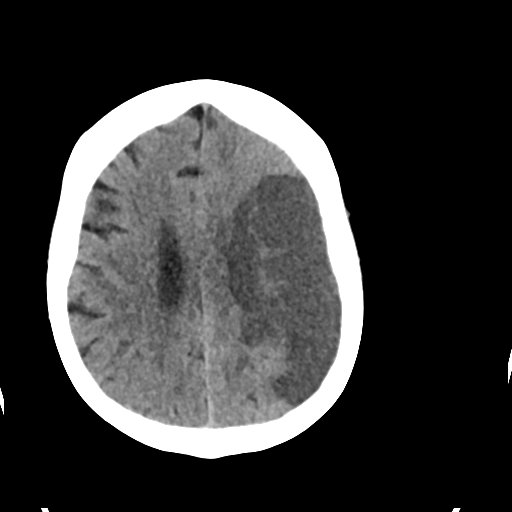
[im 20/32  bone]
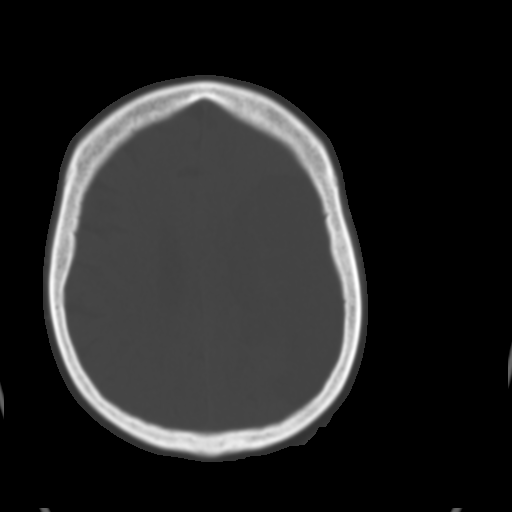
[im 24/32  brain]
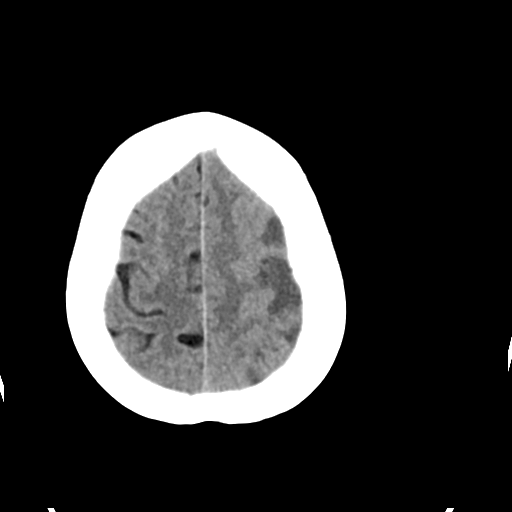
[im 28/32  brain]
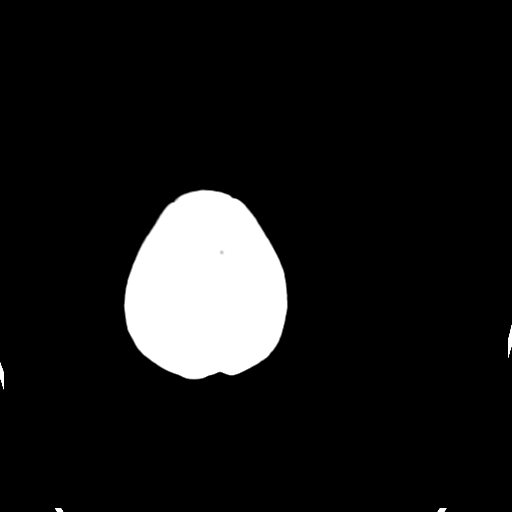

[Series 4: head bone · axial · 0.39mm/px · z∈[-112,-80]mm · 3 of 79 slices shown]
[im 8/79  bone]
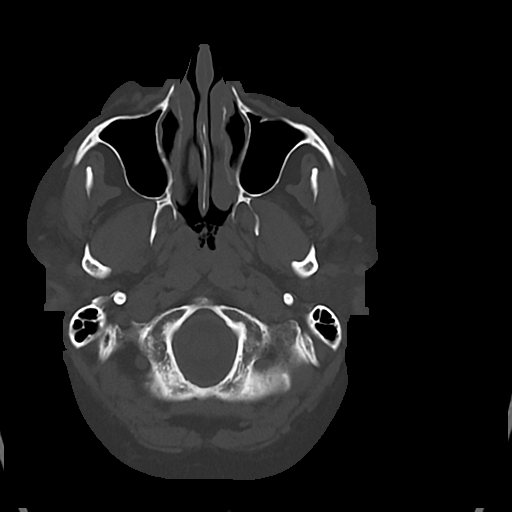
[im 16/79  bone]
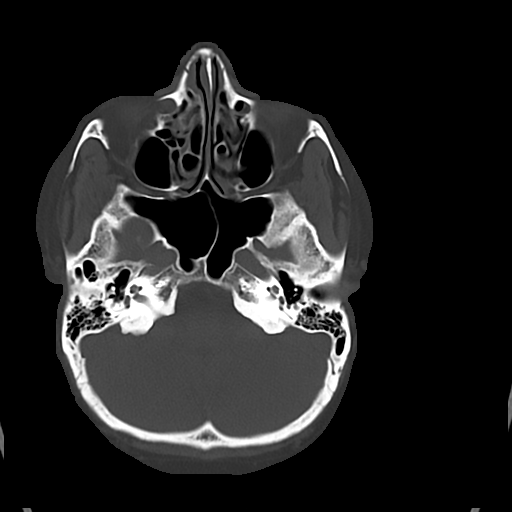
[im 24/79  bone]
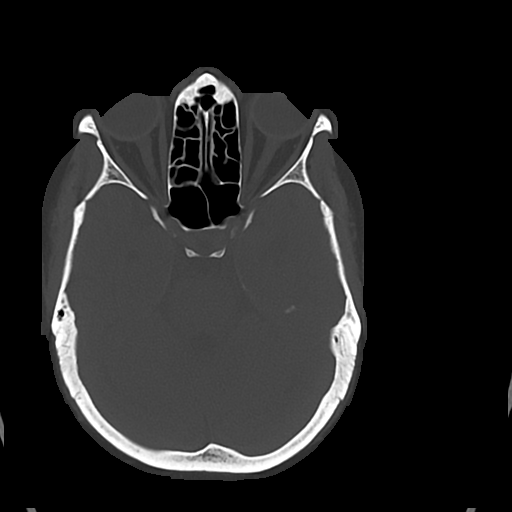

[Series 5: cor soft · coronal · 0.36mm/px · 3 of 75 slices shown]
[im 25/75  brain]
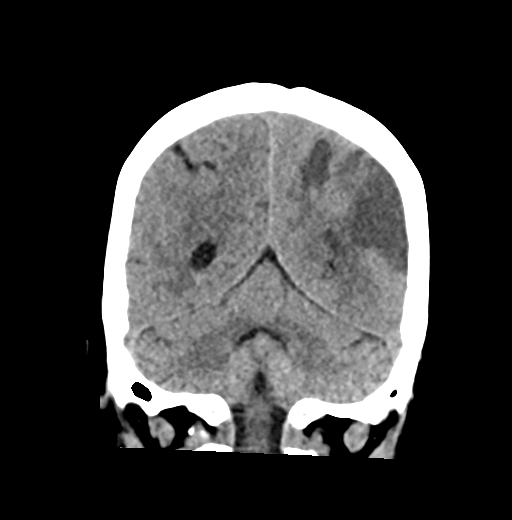
[im 33/75  brain]
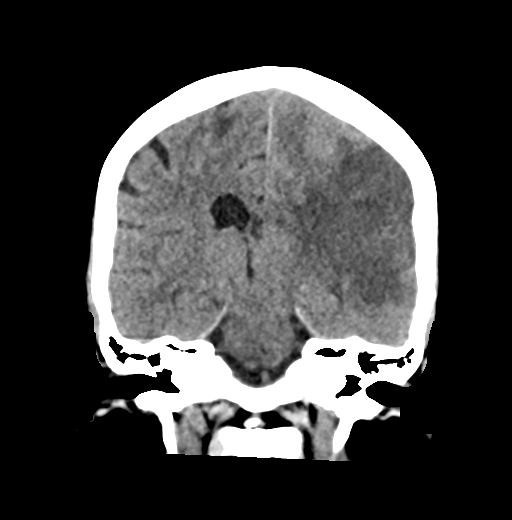
[im 42/75  brain]
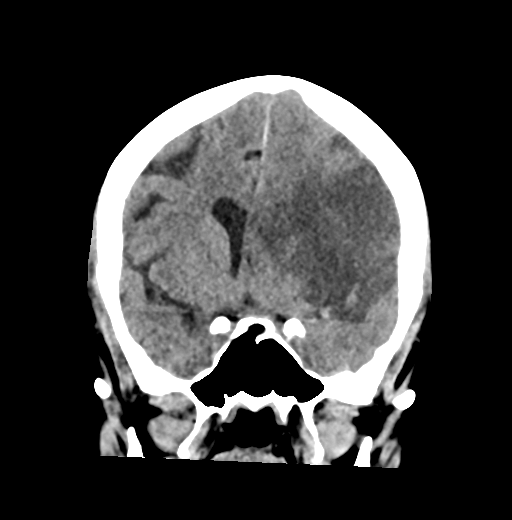

[Series 6: sag soft · sagittal · 0.34mm/px · 3 of 58 slices shown]
[im 20/58  brain]
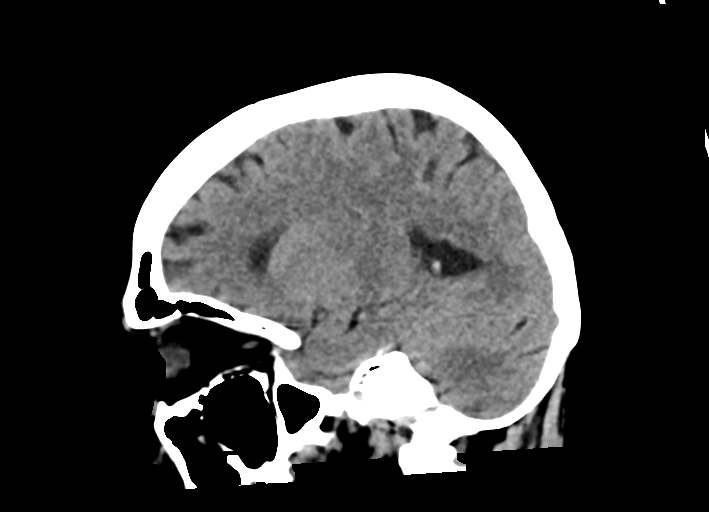
[im 29/58  brain]
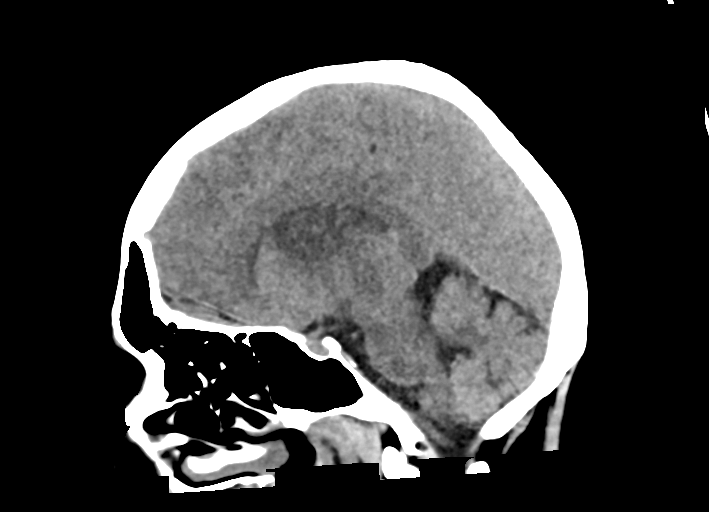
[im 39/58  brain]
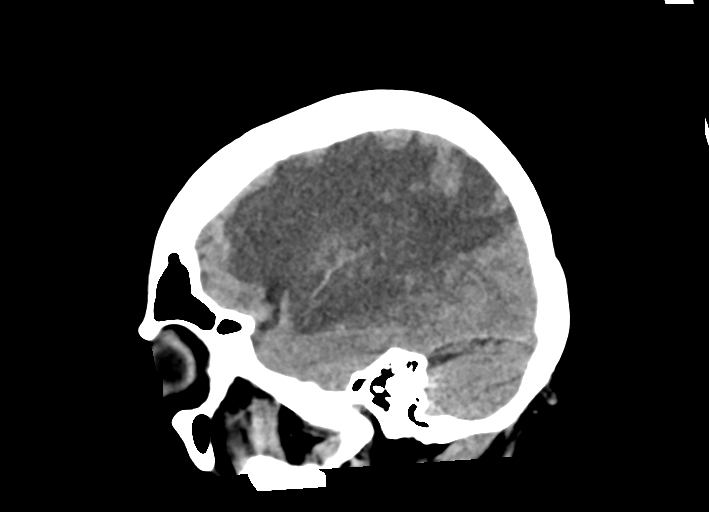

[16 of 47 positions shown; findings below may reference images not displayed]

FINDINGS: Brain: Cytotoxic edema in the left MCA distribution affecting
essentially the entire territory. There is progressive swelling with
midline shift measuring up to 9 mm as compared to 6 mm previously.
No entrapment or new infarct is seen. Petechial hemorrhage and small
nodular foci of hemorrhage at the left basal ganglia have mildly
regressed.

Vascular: Pseudo hyperdense versus truly hyperdense left MCA
branches.

Skull: Negative

Sinuses/Orbits: Unremarkable
IMPRESSION: Acute left MCA territory infarct with mildly progressive swelling (9
mm midline shift) and regressed hemorrhage.

## 2020-12-09 ENCOUNTER — Encounter: Payer: Medicaid Other | Attending: Registered Nurse | Admitting: Physical Medicine & Rehabilitation

## 2020-12-09 ENCOUNTER — Other Ambulatory Visit: Payer: Self-pay

## 2020-12-09 ENCOUNTER — Encounter: Payer: Self-pay | Admitting: Physical Medicine & Rehabilitation

## 2020-12-09 VITALS — BP 134/83 | HR 88 | Temp 98.3°F | Ht 60.0 in

## 2020-12-09 DIAGNOSIS — G8111 Spastic hemiplegia affecting right dominant side: Secondary | ICD-10-CM | POA: Insufficient documentation

## 2020-12-09 NOTE — Progress Notes (Signed)
BotoxInjection for spasticity using needle EMG guidance  Dilution: 50 Units/ml Indication: Severe spasticity which interferes with ADL,mobility and/or  hygiene and is unresponsive to medication management and other conservative care Informed consent was obtained after describing risks and benefits of the procedure with the patient. This includes bleeding, bruising, infection, excessive weakness, or medication side effects. A REMS form is on file and signed. Needle:  needle electrode Number of units per muscle FDS 50units FDP 75 units FPL 25 units Wasted 50units All injections were done after obtaining appropriate EMG activity and after negative drawback for blood. The patient tolerated the procedure well. Post procedure instructions were given. A followup appointment was made.

## 2020-12-09 NOTE — Patient Instructions (Signed)

## 2020-12-28 ENCOUNTER — Other Ambulatory Visit: Payer: Self-pay | Admitting: Physical Medicine and Rehabilitation

## 2021-01-06 ENCOUNTER — Other Ambulatory Visit: Payer: Self-pay | Admitting: Physical Medicine and Rehabilitation

## 2021-01-09 ENCOUNTER — Telehealth: Payer: Self-pay | Admitting: Physical Medicine and Rehabilitation

## 2021-01-09 IMAGING — DX DG CHEST 1V PORT
1 series · 1 of 1 positions shown · non-contrast
Comparison: 03/30/2020

CLINICAL DATA: Hypoxia

EXAM:
PORTABLE CHEST 1 VIEW

[chest]
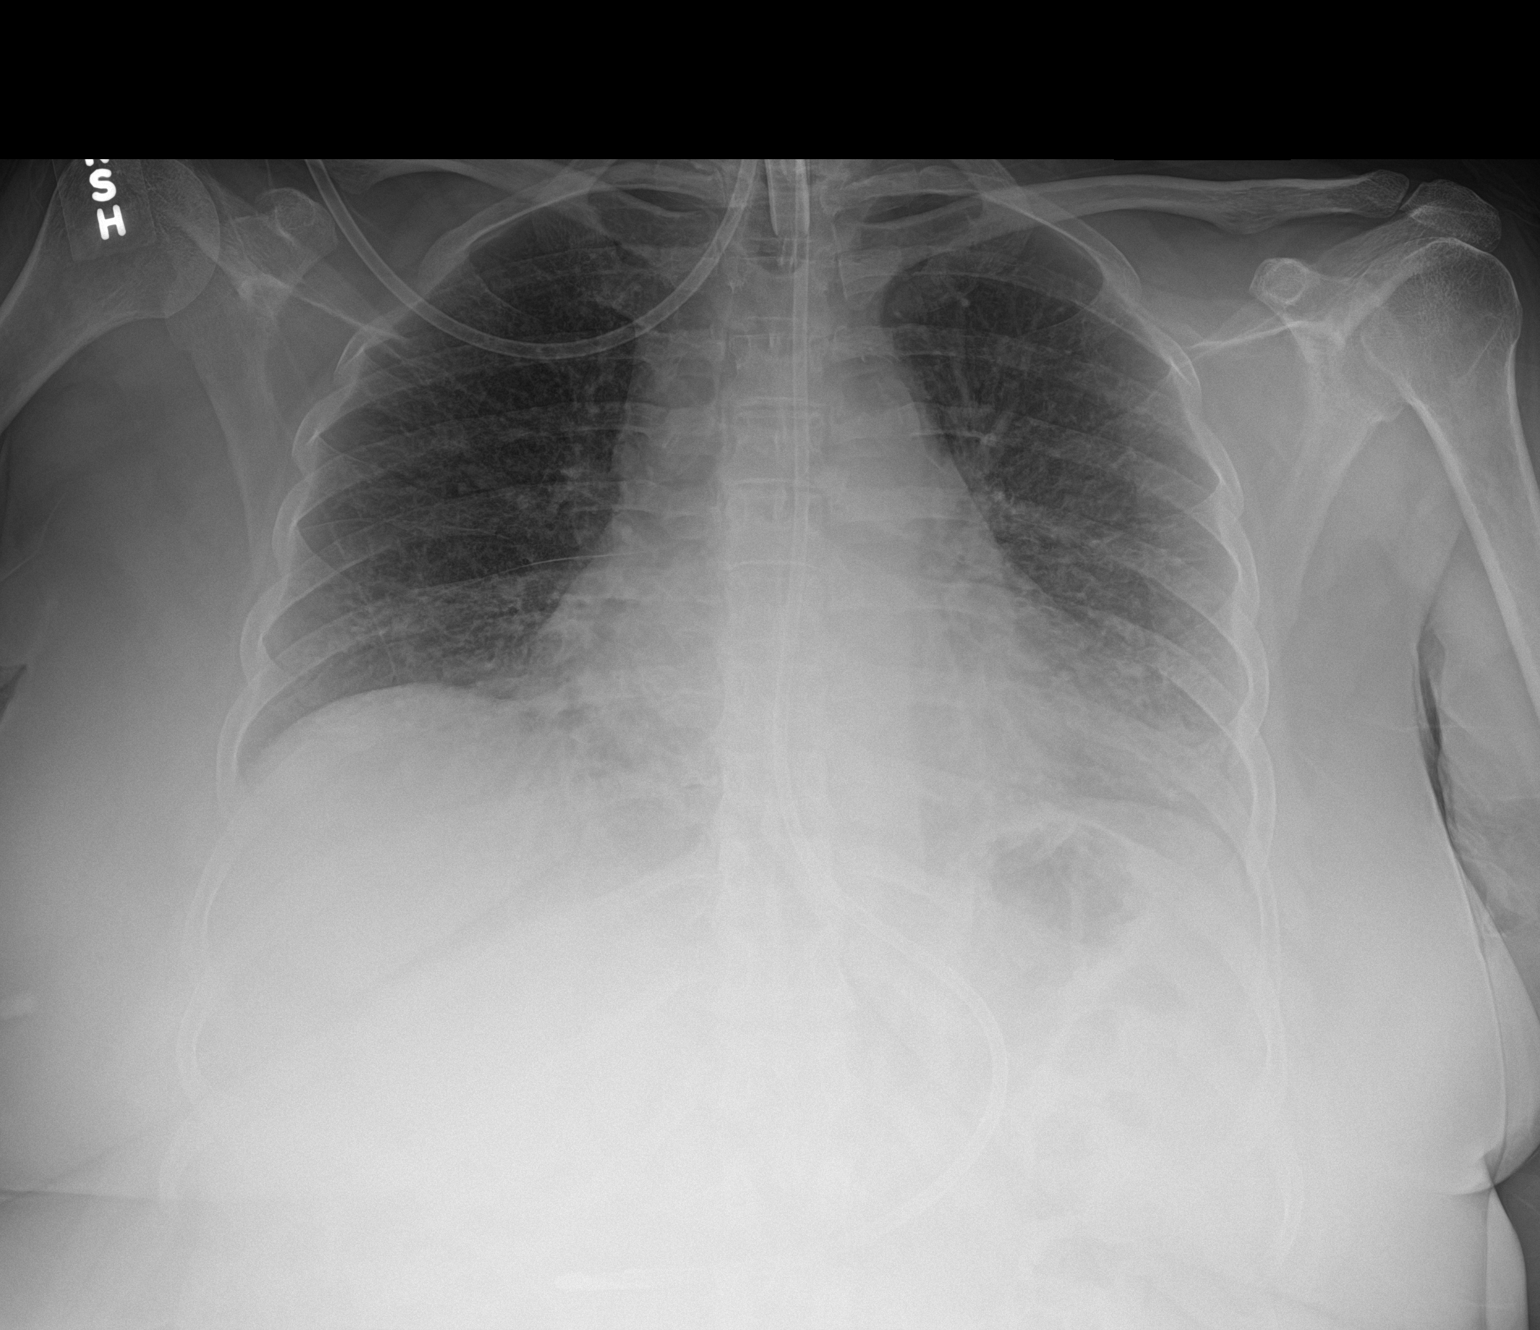

[1 of 1 positions shown; findings below may reference images not displayed]

FINDINGS: Tracheostomy in satisfactory position.

Mild hazy opacity left lung base may reflect a small left pleural
effusion, but this is equivocal. Right lung is clear. No frank
interstitial edema. No pneumothorax.

The heart is normal in size.

Weighted feeding tube terminates in the gastric antrum.
IMPRESSION: Possible small left pleural effusion, equivocal. No frank
interstitial edema.

Enteric tube terminates in the gastric antrum. Tracheostomy in
satisfactory position.

## 2021-01-09 NOTE — Telephone Encounter (Signed)
Family member [name] left voicemail needs call back about medication follow up [phone]

## 2021-01-12 ENCOUNTER — Telehealth: Payer: Self-pay

## 2021-01-12 NOTE — Telephone Encounter (Signed)
Approved 01/10/21-01/06/2022 for Tramadol

## 2021-01-27 ENCOUNTER — Encounter: Payer: Self-pay | Admitting: Physical Medicine and Rehabilitation

## 2021-01-27 ENCOUNTER — Encounter: Payer: Self-pay | Admitting: Neurology

## 2021-01-27 ENCOUNTER — Encounter: Payer: Medicaid Other | Attending: Registered Nurse | Admitting: Physical Medicine and Rehabilitation

## 2021-01-27 ENCOUNTER — Other Ambulatory Visit: Payer: Self-pay

## 2021-01-27 VITALS — BP 118/75 | HR 75 | Temp 98.5°F | Ht 60.0 in | Wt 194.0 lb

## 2021-01-27 DIAGNOSIS — I69398 Other sequelae of cerebral infarction: Secondary | ICD-10-CM | POA: Diagnosis present

## 2021-01-27 DIAGNOSIS — G894 Chronic pain syndrome: Secondary | ICD-10-CM | POA: Insufficient documentation

## 2021-01-27 DIAGNOSIS — Z79891 Long term (current) use of opiate analgesic: Secondary | ICD-10-CM | POA: Insufficient documentation

## 2021-01-27 DIAGNOSIS — Z5181 Encounter for therapeutic drug level monitoring: Secondary | ICD-10-CM | POA: Diagnosis present

## 2021-01-27 DIAGNOSIS — G8111 Spastic hemiplegia affecting right dominant side: Secondary | ICD-10-CM | POA: Insufficient documentation

## 2021-01-27 DIAGNOSIS — R252 Cramp and spasm: Secondary | ICD-10-CM | POA: Insufficient documentation

## 2021-01-27 DIAGNOSIS — R569 Unspecified convulsions: Secondary | ICD-10-CM | POA: Insufficient documentation

## 2021-01-27 MED ORDER — TIZANIDINE HCL 4 MG PO TABS: 2.0000 mg | ORAL_TABLET | Freq: Two times a day (BID) | ORAL | 5 refills | Status: DC

## 2021-01-27 MED ORDER — ACETAMINOPHEN-CODEINE #3 300-30 MG PO TABS: 1.0000 | ORAL_TABLET | Freq: Three times a day (TID) | ORAL | 3 refills | Status: DC | PRN

## 2021-01-27 MED ORDER — LEVETIRACETAM 500 MG PO TABS: 500.0000 mg | ORAL_TABLET | Freq: Two times a day (BID) | ORAL | 2 refills | Status: DC

## 2021-01-27 NOTE — Patient Instructions (Signed)
Pt is a 50 yr old female with L MCA stroke and R hemiplegia- as well as aphasia here for f/u of spasticity and aphasia as well as R hemiplegia/CVA. Botox 12/09/20 (scheduled for 03/09/21) by Dr Wynn Banker- 200 units used Pt is here for f/u on stroke.   Cannot give another Rx for  tramadol because can reduce seizure threshold- so will replace with Tylenol #3. Just try Tylenol #3 first, please.  2.  Opiate contract and oral drug screen. Since on Tylenol #3- will go over opiate contract  3. Will write Rx for Tylenol #3- 300 mg/30 mg up to 2x/day as needed for chronic pain. # 3 refills. To take the place of Tramadol  4. Will refill Keppra/Levicetracem- 500 mg BID #60 2 refills until seen by Neurology.   5. Had been on baclofen 10 mg 3x/day- until seen by Neurology- would ike to switch to Zanaflex/Tizanidine 2 mg 2x/day x 1 week, then 4 mg 2x/day for muscle tightness.  6. Can have a little sleepiness and slightly lower BP with Zanaflex  7.  Changing meds due to lowering seizure threshold with Baclofen and Tramadol. See what Neurology says about these meds.   8. F/U in 3 months

## 2021-01-27 NOTE — Progress Notes (Signed)
Subjective:    Patient ID: Deborah [name], female    DOB: 16-Apr-1971, 50 y.o.   MRN: [id number]  HPI Pt is a 50 yr old female with L MCA stroke and R hemiplegia- as well as aphasia here for f/u of spasticity and aphasia as well as R hemiplegia/CVA. Botox 08/05/20 by Dr Wynn Banker- 200 units used Pt is here for f/u on stroke.    Had a seizure 2 weeks ago-  While getting a bath.   Has been a battle to take a bath since then.  Started on Keppra for Seizures.     Pain Inventory Average Pain 1 Pain Right Now 1 My pain is dull and stabbing  LOCATION OF PAIN  shoulder, elbow, wrist, hand, knee, leg, ankle  BOWEL Number of stools per week: 3  BLADDER Normal    Mobility walk with assistance how many minutes can you walk? 2 ability to climb steps?  no do you drive?  no Do you have any goals in this area?  yes  Function not employed: date last employed .03/08/21 disabled: date disabled 03/08/21 I need assistance with the following:  dressing, bathing, toileting, meal prep, household duties, and shopping  Neuro/Psych trouble walking spasms dizziness confusion depression anxiety  Prior Studies Any changes since last visit?  no  Physicians involved in your care Any changes since last visit?  no   Family History  Problem Relation Age of Onset   Heart defect Mother        Leaky Valve   Heart Problems Father        Enlarged heart    Heart defect Maternal Grandmother        Leaky Valve    Stroke Paternal Grandfather    Social History   Socioeconomic History   Marital status: Married    Spouse name: Not on file   Number of children: Not on file   Years of education: Not on file   Highest education level: Not on file  Occupational History   Not on file  Tobacco Use   Smoking status: Every Day    Packs/day: 0.50    Types: Cigarettes   Smokeless tobacco: Never  Vaping Use   Vaping Use: Never used  Substance and Sexual Activity   Alcohol use: Not  Currently   Drug use: Not Currently   Sexual activity: Yes  Other Topics Concern   Not on file  Social History Narrative   Not on file   Social Determinants of Health   Financial Resource Strain: Not on file  Food Insecurity: Not on file  Transportation Needs: Not on file  Physical Activity: Not on file  Stress: Not on file  Social Connections: Not on file   Past Surgical History:  Procedure Laterality Date   IR CT HEAD LTD  03/08/2020   IR GASTROSTOMY TUBE MOD SED  04/25/2020   IR GASTROSTOMY TUBE REMOVAL  08/12/2020   IR PERCUTANEOUS ART THROMBECTOMY/INFUSION INTRACRANIAL INC DIAG ANGIO  03/08/2020   RADIOLOGY WITH ANESTHESIA N/A 03/08/2020   Procedure: IR WITH ANESTHESIA;  Surgeon: Julieanne Cotton, MD;  Location: [facility] OR;  Service: Radiology;  Laterality: N/A;   Past Medical History:  Diagnosis Date   DM (diabetes mellitus), type 2 (HCC)    Hyperlipidemia    Hypersomnia    Sprain of right ankle, initial encounter    Stroke (HCC)    Syncope    BP 118/75   Pulse 75   Temp 98.5 F (36.9 C)  Ht 5' (1.524 m)   Wt 194 lb (88 kg)   SpO2 98%   BMI 37.89 kg/m   Opioid Risk Score:   Fall Risk Score:  `1  Depression screen PHQ 2/9  Depression screen [facility] 2/9 08/05/2020 05/20/2020  Decreased Interest 0 0  Down, Depressed, Hopeless 0 0  PHQ - 2 Score 0 0  Altered sleeping - 0  Tired, decreased energy - 1  Change in appetite - 0  Feeling bad or failure about yourself  - 1  Trouble concentrating - 0  Moving slowly or fidgety/restless - 0  Suicidal thoughts - 0  PHQ-9 Score - 2    Review of Systems  Constitutional: Negative.   HENT: Negative.    Eyes: Negative.   Respiratory: Negative.    Cardiovascular: Negative.   Gastrointestinal: Negative.   Endocrine: Negative.   Genitourinary: Negative.   Musculoskeletal:  Positive for arthralgias, gait problem and myalgias.  Skin: Negative.   Allergic/Immunologic: Negative.   Hematological: Negative.    Psychiatric/Behavioral:  Positive for confusion and dysphoric mood. The patient is nervous/anxious.       Objective:   Physical Exam Awake,alert, appropriate, in manual w/c, accompanied by boyfriend, NAD RUE- more flexion than extensor tone- esp at elbow and fingers Fingers are in hyperextension at PIPs on R side Worsening tone as dynamically moving R elbow- wrist MAS of 1+, but MAS of 2-3 in R elbow and shoulder and fingers       Assessment & Plan:   Pt is a 50 yr old female with L MCA stroke and R hemiplegia- as well as aphasia here for f/u of spasticity and aphasia as well as R hemiplegia/CVA. Botox 12/09/20 (scheduled for 03/09/21) by Dr Wynn Banker- 200 units used Pt is here for f/u on stroke.   Cannot give another Rx for  tramadol because can reduce seizure threshold- so will replace with Tylenol #3. Just try Tylenol #3 first, please.  2.  Opiate contract and oral drug screen. Since on Tylenol #3- will go over opiate contract  3. Will write Rx for Tylenol #3- 300 mg/30 mg up to 2x/day as needed for chronic pain. # 3 refills. To take the place of Tramadol  4. Will refill Keppra/Levicetracem- 500 mg BID #60 2 refills until seen by Neurology.   5. Had been on baclofen 10 mg 3x/day- until seen by Neurology- would ike to switch to Zanaflex/Tizanidine 2 mg 2x/day x 1 week, then 4 mg 2x/day for muscle tightness.  6. Can have a little sleepiness and slightly lower BP with Zanaflex  7.  Changing meds due to lowering seizure threshold with Baclofen and Tramadol. See what Neurology says about these meds.   8. F/U in 3 months  I spent a total of 33 minutes on visit- going over changing meds due to lowering seizure threshold- and opiate contract

## 2021-02-03 LAB — DRUG TOX MONITOR 1 W/CONF, ORAL FLD
Amphetamines: NEGATIVE ng/mL (ref <10)
Barbiturates: NEGATIVE ng/mL (ref <10)
Benzodiazepines: NEGATIVE ng/mL (ref <0.50)
Buprenorphine: NEGATIVE ng/mL (ref <0.10)
Cocaine: NEGATIVE ng/mL (ref <5.0)
Fentanyl: NEGATIVE ng/mL (ref <0.10)
Heroin Metabolite: NEGATIVE ng/mL (ref <1.0)
MARIJUANA: NEGATIVE ng/mL (ref <2.5)
MDMA: NEGATIVE ng/mL (ref <10)
Meprobamate: NEGATIVE ng/mL (ref <2.5)
Methadone: NEGATIVE ng/mL (ref <5.0)
Nicotine Metabolite: NEGATIVE ng/mL (ref <5.0)
Opiates: NEGATIVE ng/mL (ref <2.5)
Phencyclidine: NEGATIVE ng/mL (ref <10)
Tapentadol: NEGATIVE ng/mL (ref <5.0)
Tramadol: 474.8 ng/mL — ABNORMAL HIGH (ref <5.0)
Tramadol: POSITIVE ng/mL — AB (ref <5.0)
Zolpidem: NEGATIVE ng/mL (ref <5.0)

## 2021-02-03 LAB — DRUG TOX ALC METAB W/CON, ORAL FLD: Alcohol Metabolite: NEGATIVE ng/mL (ref <25)

## 2021-02-06 ENCOUNTER — Telehealth: Payer: Self-pay | Admitting: *Deleted

## 2021-02-06 NOTE — Telephone Encounter (Signed)
Oral swab drug screen was consistent for prescribed medications.  ?

## 2021-03-03 ENCOUNTER — Ambulatory Visit: Payer: Medicaid Other | Admitting: Neurology

## 2021-03-03 ENCOUNTER — Encounter: Payer: Self-pay | Admitting: Neurology

## 2021-03-03 ENCOUNTER — Other Ambulatory Visit: Payer: Self-pay

## 2021-03-03 VITALS — BP 133/75 | HR 61 | Wt 177.4 lb

## 2021-03-03 DIAGNOSIS — G40209 Localization-related (focal) (partial) symptomatic epilepsy and epileptic syndromes with complex partial seizures, not intractable, without status epilepticus: Secondary | ICD-10-CM | POA: Diagnosis not present

## 2021-03-03 DIAGNOSIS — R259 Unspecified abnormal involuntary movements: Secondary | ICD-10-CM

## 2021-03-03 DIAGNOSIS — Z8673 Personal history of transient ischemic attack (TIA), and cerebral infarction without residual deficits: Secondary | ICD-10-CM

## 2021-03-03 MED ORDER — LEVETIRACETAM 500 MG PO TABS: 500.0000 mg | ORAL_TABLET | Freq: Two times a day (BID) | ORAL | 11 refills | Status: AC

## 2021-03-03 NOTE — Patient Instructions (Signed)
Schedule MRI brain without contrast  2. Schedule 1-hour EEG  3. Continue Keppra 500mg  twice a day  4. Continue with control of sugar levels  5. Please discuss starting a medication for mood with your PCP  6. Follow-up in 4 months, call for any changes   Seizure Precautions: 1. If medication has been prescribed for you to prevent seizures, take it exactly as directed.  Do not stop taking the medicine without talking to your doctor first, even if you have not had a seizure in a long time.   2. Avoid activities in which a seizure would cause danger to yourself or to others.  Don't operate dangerous machinery, swim alone, or climb in high or dangerous places, such as on ladders, roofs, or girders.  Do not drive unless your doctor says you may.  3. If you have any warning that you may have a seizure, lay down in a safe place where you can't hurt yourself.    4.  No driving for 6 months from last seizure, as per [facility].   Please refer to the following link on the Epile[employer] of [employer]'s website for more information: http://www.epilepsyfoundation.org/answerplace/Social/driving/drivingu.cfm   5.  Maintain good sleep hygiene.  6.  Contact your doctor if you have any problems that may be related to the medicine you are taking.  7.  Call 911 and bring the patient back to the ED if:        A.  The seizure lasts longer than 5 minutes.       B.  The patient doesn't awaken shortly after the seizure  C.  The patient has new problems such as difficulty seeing, speaking or moving  D.  The patient was injured during the seizure  E.  The patient has a temperature over 102 F (39C)  F.  The patient vomited and now is having trouble breathing

## 2021-03-03 NOTE — Progress Notes (Signed)
NEUROLOGY CONSULTATION NOTE  Joliet Mallozzi MRN: [id number] DOB: [birth year]  Referring provider: Dr. Genice Rouge Primary care provider: Dr. Luna Kitchens  Reason for consult:  seizures  Dear Dr Berline Chough:  Thank you for your kind referral of Deborah [name] for consultation of the above symptoms. Although her history is well known to you, please allow me to reiterate it for the purpose of our medical record. The patient was accompanied to the clinic by her husband [name] who also provides collateral information. Records and images were personally reviewed where available.   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 She was in her shower chair when she became rigid with violent generalized shaking. She did not appear to lose consciousness but was making gurgling noises. The seizure lasted 1-2 minutes. She was brought to [facility] ER where CBC, CMP, urinalysis were normal except for WBC 13.5. Head CT no acute changes, old large left MCA infarct seen. She was discharged home on Levetiracetam 500mg  BID. She had been on Tramadol and Baclofen for spasticity/pain, this was stopped last month. No further convulsions since 12/2020. 01/2021 reports that over the past 1-2 months, she has had 2-3 episodes of uncontrollable movements on the left side. She would wave her left arm or roll her left leg, grab something or bend her left leg at the knee, unable to control it. She is still aware during them and can talk a little, she just can't stop them.When he checks her glucose level, it would always be below 80. With last episode, glucose was 50. He would give her something to drink or eat and the movements resolve in a few minutes. He has seen times where she may stare but if she does not respond, it  is because she is locked on to something else.   She has a history of migraines and would complain of a headache around 2-3 times a week, usually when she is worked up about something. Family usually distracts her during those times, which helps. She talks in a high-pitched child-like voice, her husband reports this is not her baseline voice. She denies any dizziness. She has not been a happy camper since Tramadol was stopped for right arm and leg pain, the Tylenol #3 is working a little but not quite as good, [name] gives it qhs and prn during the morning. She is finally starting to get used to Tizanidine. Her next Botox session is next month. She reports pain in the right knee and groin. She becomes tearful when unable to give her age or birthday. Her husband reports a history of anxiety and depression prior to the stroke, she used to have prn lorazepam but hardly ever took them. They deny any difficulty swallowing.   Epilepsy Risk Factors:  large left MCA stroke in 02/2020. Otherwise she had a normal birth and early development.  There is no history of febrile convulsions, CNS infections such as meningitis/encephalitis, neurosurgical procedures, or family history of seizures.   PAST MEDICAL HISTORY: Past Medical History:  Diagnosis Date   DM (diabetes mellitus), type 2 (HCC)    Hyperlipidemia    Hypersomnia    Sprain of right ankle, initial encounter    Stroke [facility])    Syncope     PAST SURGICAL HISTORY: Past Surgical History:  Procedure Laterality Date   IR CT HEAD LTD  03/08/2020   IR GASTROSTOMY TUBE MOD SED  04/25/2020   IR GASTROSTOMY TUBE REMOVAL  08/12/2020   IR PERCUTANEOUS ART THROMBECTOMY/INFUSION INTRACRANIAL INC DIAG ANGIO  03/08/2020   RADIOLOGY WITH ANESTHESIA N/A 03/08/2020   Procedure: IR WITH ANESTHESIA;  Surgeon: Julieanne Cotton, MD;  Location: [facility] OR;  Service: Radiology;  Laterality: N/A;    MEDICATIONS: Current Outpatient Medications on File Prior to Visit  Medication  Sig Dispense Refill   acetaminophen (TYLENOL) 325 MG tablet Take 2 tablets (650 mg total) by mouth every 4 (four) hours as needed for mild pain (or temp > 37.5 C (99.5 F)).     acetaminophen-codeine (TYLENOL #3) 300-30 MG tablet Take 1 tablet by mouth every 8 (eight) hours as needed for severe pain. 60 tablet 3   amantadine (SYMMETREL) 100 MG capsule TAKE 2 CAPSULES BY MOUTH DAILY. TO HELP WITH APHASIA/WORD FINDING 180 capsule 1   atorvastatin (LIPITOR) 80 MG tablet PLACE 1 TABLET (80 MG TOTAL) INTO FEEDING TUBE DAILY. 30 tablet 0   baclofen (LIORESAL) 10 MG tablet TAKE HALF A TABLET BY MOUTH 4 TIMES DAILY FOR 1 WEEK, THEN 1 TABLET 4 TIMES DAILY FOR SPASTICITY/MUSCLE TIGHTNESS 120 tablet 5   BD PEN NEEDLE NANO 2ND GEN 32G X 4 MM MISC See admin instructions. with insulin     bethanechol (URECHOLINE) 10 MG tablet PLACE 1 TABLET (10 MG TOTAL) INTO FEEDING TUBE THREE TIMES DAILY. 60 tablet 0   clopidogrel (PLAVIX) 75 MG tablet PLACE 1 TABLET (75 MG TOTAL) INTO FEEDING TUBE DAILY. 30 tablet 0   docusate (COLACE) 50 MG/5ML liquid Place 10 mLs (100 mg total) into feeding tube 2 (two) times daily. 100 mL 0   glimepiride (AMARYL) 2 MG tablet Take 2 mg by mouth daily.     insulin glargine (LANTUS) 100 UNIT/ML Solostar Pen Inject 28 Units into the skin 2 (two) times daily. 15 mL 11   levETIRAcetam (KEPPRA) 500 MG tablet Take 1 tablet (500 mg total) by mouth 2 (two) times daily. 60 tablet 2   Multiple Vitamin (MULTIVITAMIN WITH MINERALS) TABS tablet Place 1 tablet into feeding tube daily.     Nutritional Supplements (FEEDING SUPPLEMENT, GLUCERNA 1.5 CAL,) LIQD Place 118 mLs into feeding tube 4 (four) times daily -  with meals and at bedtime. 1000 mL 1   Nutritional Supplements (FEEDING SUPPLEMENT, PROSOURCE TF,) liquid Place 45 mLs into feeding tube 3 (three) times daily.     polyethylene glycol (MIRALAX / GLYCOLAX) 17 g packet Place 17 g into feeding tube daily. 14 each 0   tiZANidine (ZANAFLEX) 4 MG tablet  Take 0.5-1 tablets (2-4 mg total) by mouth 2 (two) times daily. 60 tablet 5   Water For Irrigation, Sterile (FREE WATER) SOLN Place 150 mLs into feeding tube 4 (four) times daily.     No current facility-administered medications on file prior to visit.    ALLERGIES: Allergies  Allergen Reactions   Aspirin Shortness Of Breath   Mushroom Extract Complex Swelling    FAMILY HISTORY: Family History  Problem Relation Age of Onset   Heart defect Mother        Leaky Valve   Heart Problems Father        Enlarged heart    Heart defect Maternal Grandmother        Leaky Valve    Stroke Paternal Grandfather     SOCIAL HISTORY: Social History   Socioeconomic History  Marital status: Married    Spouse name: Not on file   Number of children: Not on file   Years of education: Not on file   Highest education level: Not on file  Occupational History   Not on file  Tobacco Use   Smoking status: Every Day    Packs/day: 0.50    Types: Cigarettes   Smokeless tobacco: Never  Vaping Use   Vaping Use: Never used  Substance and Sexual Activity   Alcohol use: Not Currently   Drug use: Not Currently   Sexual activity: Yes  Other Topics Concern   Not on file  Social History Narrative   Not on file   Social Determinants of Health   Financial Resource Strain: Not on file  Food Insecurity: Not on file  Transportation Needs: Not on file  Physical Activity: Not on file  Stress: Not on file  Social Connections: Not on file  Intimate Partner Violence: Not on file     PHYSICAL EXAM: Vitals:   03/03/21 0858  BP: 133/75  Pulse: 61  SpO2: 100%   General: No acute distress Head:  Normocephalic/atraumatic Skin/Extremities: No rash, no edema Neurological Exam: Mental status: alert and oriented to person. She has expressive > receptive aphasia, difficulty with naming, repetition (needing to say each word slowly with paraphasic errors), tries to answer questions and able to demonstrate  how many in her house using her fingers. Mild difficulty following commands. Attention and concentration are normal.   Cranial nerves: CN I: not tested CN II: pupils equal, round and reactive to light, visual fields intact CN III, IV, VI:  full range of motion, no nystagmus, no ptosis CN V: facial sensation intact CN VII: right facial droop CN VIII: hearing intact to conversation Bulk & Tone: increased tone on right UE and LE, no fasciculations. 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 Seizure likely secondary to old stroke, however her husband also reports new onset episodes of LEFT arm/leg involuntary movements that appear to occur in the setting of hypoglycemia. MRI brain without contrast and 1-hour EEG will be ordered. Continue Levetiracetam 500mg  BID. Continue glucose control. Agree with avoiding Tramadol and Baclofen, discussed with patient/husband that these can lower seizure threshold. They were advised to speak with her PCP regarding consideration for starting medication for anxiety/depression. She does not drive. Follow-up in 3-4 months, they know to call for any changes.   Thank you for allowing me to participate in the care of this patient. Please do not hesitate to call for any questions or concerns.   , M.D.  CC: Dr. Patrcia Dolly, Dr. Berline Chough

## 2021-03-08 ENCOUNTER — Telehealth: Payer: Self-pay

## 2021-03-08 NOTE — Telephone Encounter (Signed)
PA for Acetaminophen with Codeine has been submitted.

## 2021-03-08 NOTE — Telephone Encounter (Signed)
Approved 03/08/21-09/04/2021.

## 2021-03-10 ENCOUNTER — Ambulatory Visit: Payer: Medicaid Other | Admitting: Physical Medicine & Rehabilitation

## 2021-03-16 ENCOUNTER — Ambulatory Visit: Payer: Medicaid Other | Admitting: Neurology

## 2021-03-16 ENCOUNTER — Other Ambulatory Visit: Payer: Self-pay

## 2021-03-16 DIAGNOSIS — G40209 Localization-related (focal) (partial) symptomatic epilepsy and epileptic syndromes with complex partial seizures, not intractable, without status epilepticus: Secondary | ICD-10-CM | POA: Diagnosis not present

## 2021-03-16 DIAGNOSIS — R259 Unspecified abnormal involuntary movements: Secondary | ICD-10-CM

## 2021-03-16 DIAGNOSIS — Z8673 Personal history of transient ischemic attack (TIA), and cerebral infarction without residual deficits: Secondary | ICD-10-CM

## 2021-03-31 ENCOUNTER — Encounter: Payer: Medicaid Other | Admitting: Physical Medicine & Rehabilitation

## 2021-04-03 NOTE — Procedures (Signed)
ELECTROENCEPHALOGRAM REPORT  Date of Study: 03/16/2021  Patient's Name: Deborah [name] MRN: [id number] Date of Birth: December 21, 1970  Referring Provider: Dr. Patrcia Dolly  Clinical History: This is a 50 year old woman with left MCA stroke and new onset seizure in 12/2020. Husband also reports episodes of left arm/leg involuntary movements. EEG for classification.  Medications: KEPPRA 500 MG tablet TYLENOL 325 MG tablet TYLENOL #3 300-30 MG tablet SYMMETREL 100 MG capsule LIPITOR 80 MG tablet LIORESAL 10 MG tablet URECHOLINE 10 MG tablet PLAVIX 75 MG tablet COLACE 50 MG/5ML liquid AMARYL 2 MG tablet LANTUS 100 UNIT/ML Solostar Pen MULTIVITAMIN WITH MINERALS TABS tablet GLUCERNA 1.5 CAL LIQD PROSOURCE TF liquid MIRALAX / GLYCOLAX 17 g packet ZANAFLEX 4 MG tablet  Technical Summary: A multichannel digital 1-hour EEG recording measured by the international 10-20 system with electrodes applied with paste and impedances below 5000 ohms performed in our laboratory with EKG monitoring in an awake and asleep patient.  Hyperventilation was not performed. Photic stimulation was performed.  The digital EEG was referentially recorded, reformatted, and digitally filtered in a variety of bipolar and referential montages for optimal display.    Description: The patient is awake and asleep during the recording.  During maximal wakefulness, there is a poorly sustained medium voltage 8 Hz posterior dominant rhythm that attenuates with eye opening.  There is near-continuous focal 4-5 Hz theta slowing over the left temporal region.  During drowsiness and sleep, there is an increase in theta slowing of the background.  Asymmetric vertex waves and sleep spindles were seen only on the right hemisphere/parasagittal derivations.  Photic stimulation did not elicit any abnormalities. There were occasional sharp transients seen on the right temporal region that appeared artifactual in morphology with electrode  artifact seen at times. There were no clear epileptiform discharges or electrographic seizures seen.    EKG lead was unremarkable.  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   Clinical Correlation of the above findings indicates focal cerebral dysfunction over the left hemisphere suggestive of underlying structural or physiologic abnormality. The absence of epileptiform discharges does not exclude a clinical diagnosis of epilepsy. Clinical correlation is advised.    Patrcia Dolly, M.D.

## 2021-04-13 ENCOUNTER — Telehealth: Payer: Self-pay

## 2021-04-13 NOTE — Telephone Encounter (Signed)
-----   Message from Van Clines, MD sent at 04/03/2021 12:29 PM EDT ----- Pls let husband know the EEG did not show any seizure activity. Changes were seen on the left side consistent with her prior stroke. Is she still having the abnormal movements on her left arm and leg? If yes, would proceed with MRI brain and would recommend doing a 48-hour EEG to try to capture these episodes and see what the brain waves show when she is having them. Thanks

## 2021-04-13 NOTE — Telephone Encounter (Signed)
Spoke with opt husband the EEG did not show any seizure activity. Changes were seen on the left side consistent with her prior stroke. Is she still having the abnormal movements on her left arm and leg? NO not at this time she is not having any of the abnormal movement,  If yes, would proceed with MRI brain and would recommend doing a 48-hour EEG to try to capture these episodes and see what the brain waves show when she is having them. At this time we are going to hold off on the MRI and the 48 hour EEG pt husband advised to call  the office if they needed [place].

## 2021-04-28 ENCOUNTER — Other Ambulatory Visit: Payer: Self-pay

## 2021-04-28 ENCOUNTER — Encounter: Payer: Medicaid Other | Attending: Registered Nurse | Admitting: Physical Medicine and Rehabilitation

## 2021-04-28 ENCOUNTER — Encounter: Payer: Self-pay | Admitting: Physical Medicine and Rehabilitation

## 2021-04-28 VITALS — BP 150/79 | HR 76 | Temp 98.4°F | Ht 60.0 in

## 2021-04-28 DIAGNOSIS — G8111 Spastic hemiplegia affecting right dominant side: Secondary | ICD-10-CM | POA: Diagnosis present

## 2021-04-28 DIAGNOSIS — R252 Cramp and spasm: Secondary | ICD-10-CM | POA: Insufficient documentation

## 2021-04-28 DIAGNOSIS — I69320 Aphasia following cerebral infarction: Secondary | ICD-10-CM | POA: Diagnosis present

## 2021-04-28 DIAGNOSIS — I63512 Cerebral infarction due to unspecified occlusion or stenosis of left middle cerebral artery: Secondary | ICD-10-CM | POA: Insufficient documentation

## 2021-04-28 DIAGNOSIS — I69398 Other sequelae of cerebral infarction: Secondary | ICD-10-CM | POA: Diagnosis present

## 2021-04-28 DIAGNOSIS — Z993 Dependence on wheelchair: Secondary | ICD-10-CM | POA: Diagnosis present

## 2021-04-28 MED ORDER — AMANTADINE HCL 100 MG PO CAPS: ORAL_CAPSULE | ORAL | 1 refills | Status: DC

## 2021-04-28 NOTE — Progress Notes (Signed)
Subjective:    Patient ID: Audra Kagel, female    DOB: 07-Apr-1971, 50 y.o.   MRN: [id number]  HPI Pt is a 50 yr old female with L MCA stroke and R hemiplegia- in September 2021- as well as aphasia here for f/u of spasticity and aphasia as well as R hemiplegia/CVA. Botox 12/09/20 (scheduled for 03/09/21) by Dr Wynn Banker- 200 units used Pt is here for f/u on stroke.    Missed Botox since pt and spouse were both sick.  Hasn't been rescheduled yet.  Was scheduled on 03/09/21- but missed it.   Doing well with Tylenol #3- given up on getting tramadol.  Gives her 1 in Am and 1 in evening.  Per pt, going OK with Tylenol #3.   Still taking Keppra- Have seen Neurology- recommended an EEG- and MRI of brain.   Did the EEG- didn't see anything with EEG- but documentation said no seizures while on EEG_ but it was abnormal  for focal cerebral dysfunction of L hemisphere.   Based on documentation- doesn't want MRI- pt "wont' stand for it".  Refuses very vehemently "NO" loudly and said "quit" when asked even if knocked her out.  Was told they couldn't help her back on table-  Doesn't like medical care- takes 2 weeks to see anyone medical.    Spouse thinks whenever her BG is low, it causes a seizure- when it comes back up, she's fine.         Pain Inventory Average Pain 2 Pain Right Now 1 My pain is intermittent, sharp, tingling, and aching  LOCATION OF PAIN  ON Right side of the body: WRIST, HAND, FINGERS, & LEG  BOWEL Number of stools per week: 4  BLADDER Normal   Mobility walk with assistance use a cane how many minutes can you walk? 2 to 3 minutes ability to climb steps?  no do you drive?  no use a wheelchair needs help with transfers  Function disabled: date disabled on SSI I need assistance with the following:  feeding, dressing, bathing, toileting, meal prep, and household duties Do you have any goals in this area?  yes  Neuro/Psych weakness trouble  walking spasms dizziness confusion depression anxiety  Prior Studies Any changes since last visit?  no  Physicians involved in your care Any changes since last visit?  no   Family History  Problem Relation Age of Onset   Heart defect Mother        Leaky Valve   Heart Problems Father        Enlarged heart    Heart defect Maternal Grandmother        Leaky Valve    Stroke Paternal Grandfather    Social History   Socioeconomic History   Marital status: Married    Spouse name: Not on file   Number of children: Not on file   Years of education: Not on file   Highest education level: Not on file  Occupational History   Not on file  Tobacco Use   Smoking status: Former    Packs/day: 0.50    Types: Cigarettes   Smokeless tobacco: Never  Vaping Use   Vaping Use: Never used  Substance and Sexual Activity   Alcohol use: Not Currently   Drug use: Not Currently   Sexual activity: Yes  Other Topics Concern   Not on file  Social History Narrative   Working on learning how to talk again   Right handed before the stroke  Lives with family    Social Determinants of Health   Financial Resource Strain: Not on file  Food Insecurity: Not on file  Transportation Needs: Not on file  Physical Activity: Not on file  Stress: Not on file  Social Connections: Not on file   Past Surgical History:  Procedure Laterality Date   IR CT HEAD LTD  03/08/2020   IR GASTROSTOMY TUBE MOD SED  04/25/2020   IR GASTROSTOMY TUBE REMOVAL  08/12/2020   IR PERCUTANEOUS ART THROMBECTOMY/INFUSION INTRACRANIAL INC DIAG ANGIO  03/08/2020   RADIOLOGY WITH ANESTHESIA N/A 03/08/2020   Procedure: IR WITH ANESTHESIA;  Surgeon: Julieanne Cotton, MD;  Location: [facility] OR;  Service: Radiology;  Laterality: N/A;   Past Medical History:  Diagnosis Date   DM (diabetes mellitus), type 2 (HCC)    Hyperlipidemia    Hypersomnia    Sprain of right ankle, initial encounter    Stroke (HCC)    Syncope    BP (!)  150/79   Pulse 76   Temp 98.4 F (36.9 C)   Ht 5' (1.524 m)   SpO2 98%   BMI 34.65 kg/m   Opioid Risk Score:   Fall Risk Score:  `1  Depression screen PHQ 2/9  Depression screen [facility] 2/9 08/05/2020 05/20/2020  Decreased Interest 0 0  Down, Depressed, Hopeless 0 0  PHQ - 2 Score 0 0  Altered sleeping - 0  Tired, decreased energy - 1  Change in appetite - 0  Feeling bad or failure about yourself  - 1  Trouble concentrating - 0  Moving slowly or fidgety/restless - 0  Suicidal thoughts - 0  PHQ-9 Score - 2    Review of Systems  Constitutional:  Positive for appetite change, chills and fever.       POOR APPETITE  Gastrointestinal:  Positive for nausea.  Endocrine:       LOW BLOOD GLUCOSE  Musculoskeletal:  Positive for gait problem.  All other systems reviewed and are negative.     Objective:   Physical Exam  Awake, alert, aphasic- saying yes and no- vehement that will NOT do MRI- very loud about it; in manual w/c; with spouse, NAD LUE/LLE 5/5 Getting bad tone that's worse in RUE Neuro: MAS of 4 in L wrist with hyperflexion of wrist and R thumb crossed over palm- with increased tone/MAS of 3-4 in hand Elbow MAS of 3 and MAS of R shoulder- has ability to abduct/flex shoulder to ~ 45 degrees and causes pain with any ROM of RUE.        Assessment & Plan:   Pt is a 50 yr old female with L MCA stroke and R hemiplegia- and hx of L kidney removal- didn't work- and hypertrophy of R kidney- CKD stage III as a result- in September 2021- as well as aphasia here for f/u of spasticity and aphasia as well as R hemiplegia/CVA. Botox 12/09/20 (scheduled for 03/09/21) by Dr Wynn Banker- 200 units used Pt is here for f/u on stroke.    Will reschedule for Botox of RUE- spasticity getting worse- explained spasticity gets worse for 1-2 years.  MAS of 3-4 in RUE now- much worse, so will reschedule with Dr Wynn Banker-   2. Takes 1/4 pill of Zanaflex- 2x/day due to spasticity-   3. Pt' and  spouse would rather not stop Amantadine so I'm completely fine continuing it 200 mg daily-   4. Pt needs to get 30 days of Tylenol #3 at a time- that's  the way I wrote it!!!! Please d/w pharmacy- and let them know if I've written anything incorrectly, let me know.    5. Pt meets criteria for disability- suggested they file for it- pt has been disabled 1 year and I don't see her getting more significant return at this time- so therefore is completely disabled. Has severe aphasia, and not walking; cannot use R side in any significant way.    6.  Con't Zanaflex at current dose. Doesn't need refills right now   7. 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    8. F/U with me 2-4 weeks after Botox.   I spent a total of 33 minutes on visit- discussing Botox, and Baclofen added as allergy and  pain meds and going over spasticity and it's progression- convinced pt she needs Botox.

## 2021-04-28 NOTE — Patient Instructions (Addendum)
Pt is a 50 yr old female with L MCA stroke and R hemiplegia- and hx of L kidney removal- didn't work- and hypertrophy of R kidney- CKD stage III as a result- in September 2021- as well as aphasia here for f/u of spasticity and aphasia as well as R hemiplegia/CVA. Botox 12/09/20 (scheduled for 03/09/21) by Dr Wynn Banker- 200 units used Pt is here for f/u on stroke.    Will reschedule for Botox of RUE- spasticity getting worse- explained spasticity gets worse for 1-2 years.  MAS of 3-4 in RUE now- much worse, so will reschedule with Dr Wynn Banker-   2. Takes 1/4 pill of Zanaflex- 2x/day due to spasticity-   3. Pt' and spouse would rather not stop Amantadine so I'm completely fine continuing it 200 mg daily-   4. Pt needs to get 30 days of Tylenol #3 at a time- that's the way I wrote it!!!! Please d/w pharmacy- and let them know if I've written anything incorrectly, let me know.    5. Pt meets criteria for disability- suggested they file for it- pt has been disabled 1 year and I don't see her getting more significant return at this time- so therefore is completely disabled. Has severe aphasia, and not walking; cannot use R side in any significant way.    6.  Con't Zanaflex at current dose.  Doesn't need refills  7. 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    8. F/U with me 2-4 weeks after Botox.

## 2021-06-23 ENCOUNTER — Encounter: Payer: Medicaid Other | Attending: Registered Nurse | Admitting: Physical Medicine & Rehabilitation

## 2021-06-23 DIAGNOSIS — G8111 Spastic hemiplegia affecting right dominant side: Secondary | ICD-10-CM | POA: Insufficient documentation

## 2021-06-23 DIAGNOSIS — R252 Cramp and spasm: Secondary | ICD-10-CM | POA: Insufficient documentation

## 2021-06-23 DIAGNOSIS — Z993 Dependence on wheelchair: Secondary | ICD-10-CM | POA: Insufficient documentation

## 2021-06-23 DIAGNOSIS — I69398 Other sequelae of cerebral infarction: Secondary | ICD-10-CM | POA: Insufficient documentation

## 2021-06-23 DIAGNOSIS — I63512 Cerebral infarction due to unspecified occlusion or stenosis of left middle cerebral artery: Secondary | ICD-10-CM | POA: Insufficient documentation

## 2021-06-23 DIAGNOSIS — I69320 Aphasia following cerebral infarction: Secondary | ICD-10-CM | POA: Insufficient documentation

## 2021-07-07 ENCOUNTER — Ambulatory Visit: Payer: Medicaid Other | Admitting: Neurology

## 2021-07-13 ENCOUNTER — Encounter: Payer: Self-pay | Admitting: Neurology

## 2021-08-04 ENCOUNTER — Encounter: Payer: Medicaid Other | Admitting: Physical Medicine and Rehabilitation

## 2021-08-07 ENCOUNTER — Other Ambulatory Visit: Payer: Self-pay | Admitting: Physical Medicine and Rehabilitation

## 2021-08-23 ENCOUNTER — Other Ambulatory Visit: Payer: Self-pay | Admitting: Physical Medicine and Rehabilitation

## 2021-09-01 ENCOUNTER — Ambulatory Visit: Payer: Medicaid Other | Admitting: Physical Medicine and Rehabilitation

## 2021-09-08 ENCOUNTER — Telehealth: Payer: Self-pay

## 2021-09-08 NOTE — Telephone Encounter (Signed)
Prior Authorization - Acetaminophen #3 created in Cover My Meds on 09/08/2021. ? ?

## 2021-10-03 NOTE — Telephone Encounter (Signed)
?  PA Case: [id number], Status: Denied. Notification: Completed. ?

## 2021-10-09 ENCOUNTER — Encounter: Payer: Self-pay | Admitting: *Deleted

## 2021-10-09 ENCOUNTER — Telehealth: Payer: Self-pay | Admitting: *Deleted

## 2021-10-09 NOTE — Telephone Encounter (Signed)
Mrs Rom's caretaker called about getting a refill on her narcotic. He does not understand why she cannot get a refill. It looks like last time she was seen was 04/28/21 and has cancelled the last two scheduled appointments. I tried to call and had t leave a VM. I have sent a mychart message as well, informing them that she does not have a diagnosis with will be approved for narcotics through her insurance. I gave them information about the cost at retail $80 and around $13 with [employer] [facility] coupon but they must have one to get at that price. I also included to get any further refills she must be seen in our office. ?

## 2021-11-06 ENCOUNTER — Other Ambulatory Visit: Payer: Self-pay | Admitting: Physical Medicine and Rehabilitation

## 2023-12-13 ENCOUNTER — Encounter (HOSPITAL_COMMUNITY): Payer: Self-pay | Admitting: Interventional Radiology
